# Patient Record
Sex: Female | Born: 1950
Health system: Southern US, Community
[De-identification: ages and names within clinical notes are randomized; demographics above are authoritative.]

## PROBLEM LIST (undated history)

## (undated) DIAGNOSIS — D369 Benign neoplasm, unspecified site: Secondary | ICD-10-CM

## (undated) DIAGNOSIS — M8580 Other specified disorders of bone density and structure, unspecified site: Secondary | ICD-10-CM

## (undated) DIAGNOSIS — G5600 Carpal tunnel syndrome, unspecified upper limb: Secondary | ICD-10-CM

## (undated) DIAGNOSIS — Z860100 Personal history of colon polyps, unspecified: Secondary | ICD-10-CM

## (undated) DIAGNOSIS — N390 Urinary tract infection, site not specified: Secondary | ICD-10-CM

## (undated) DIAGNOSIS — Z9889 Other specified postprocedural states: Secondary | ICD-10-CM

## (undated) DIAGNOSIS — F329 Major depressive disorder, single episode, unspecified: Secondary | ICD-10-CM

## (undated) DIAGNOSIS — R112 Nausea with vomiting, unspecified: Secondary | ICD-10-CM

## (undated) HISTORY — PX: KNEE ARTHROSCOPY: SHX127

## (undated) HISTORY — DX: Other specified disorders of bone density and structure, unspecified site: M85.80

## (undated) HISTORY — PX: ORIF TOE FRACTURE: SUR965

## (undated) HISTORY — DX: Personal history of colonic polyps: Z86.010

## (undated) HISTORY — DX: Benign neoplasm, unspecified site: D36.9

## (undated) HISTORY — DX: Personal history of colon polyps, unspecified: Z86.0100

## (undated) HISTORY — DX: Carpal tunnel syndrome, unspecified upper limb: G56.00

## (undated) HISTORY — PX: BUNIONECTOMY: SHX129

## (undated) HISTORY — PX: APPENDECTOMY: SHX54

## (undated) HISTORY — PX: OTHER SURGICAL HISTORY: SHX169

## (undated) HISTORY — PX: HEMORRHOID SURGERY: SHX153

## (undated) HISTORY — PX: TUBAL LIGATION: SHX77

## (undated) HISTORY — PX: CHOLECYSTECTOMY: SHX55

## (undated) HISTORY — DX: Major depressive disorder, single episode, unspecified: F32.9

## (undated) HISTORY — PX: KNEE ARTHROSCOPY: SUR90

---

## 1992-10-24 HISTORY — PX: ABDOMINAL HYSTERECTOMY: SHX81

## 2000-11-15 ENCOUNTER — Emergency Department (HOSPITAL_COMMUNITY): Admission: EM | Admit: 2000-11-15 | Discharge: 2000-11-15 | Payer: Self-pay | Admitting: Emergency Medicine

## 2001-03-05 ENCOUNTER — Encounter: Payer: Self-pay | Admitting: Obstetrics and Gynecology

## 2001-03-05 ENCOUNTER — Ambulatory Visit (HOSPITAL_COMMUNITY): Admission: RE | Admit: 2001-03-05 | Discharge: 2001-03-05 | Payer: Self-pay | Admitting: Obstetrics and Gynecology

## 2001-10-30 ENCOUNTER — Encounter: Payer: Self-pay | Admitting: Internal Medicine

## 2001-10-30 ENCOUNTER — Encounter: Payer: Self-pay | Admitting: Emergency Medicine

## 2001-10-30 ENCOUNTER — Observation Stay (HOSPITAL_COMMUNITY): Admission: EM | Admit: 2001-10-30 | Discharge: 2001-10-31 | Payer: Self-pay | Admitting: Emergency Medicine

## 2001-10-31 ENCOUNTER — Encounter: Payer: Self-pay | Admitting: Internal Medicine

## 2001-10-31 ENCOUNTER — Encounter: Payer: Self-pay | Admitting: Family Medicine

## 2001-11-08 ENCOUNTER — Ambulatory Visit (HOSPITAL_COMMUNITY): Admission: RE | Admit: 2001-11-08 | Discharge: 2001-11-08 | Payer: Self-pay | Admitting: Internal Medicine

## 2002-05-13 ENCOUNTER — Emergency Department (HOSPITAL_COMMUNITY): Admission: EM | Admit: 2002-05-13 | Discharge: 2002-05-13 | Payer: Self-pay | Admitting: Emergency Medicine

## 2002-05-14 ENCOUNTER — Ambulatory Visit (HOSPITAL_COMMUNITY): Admission: RE | Admit: 2002-05-14 | Discharge: 2002-05-14 | Payer: Self-pay | Admitting: Family Medicine

## 2002-05-14 ENCOUNTER — Encounter: Payer: Self-pay | Admitting: Family Medicine

## 2002-08-16 ENCOUNTER — Ambulatory Visit (HOSPITAL_COMMUNITY): Admission: RE | Admit: 2002-08-16 | Discharge: 2002-08-16 | Payer: Self-pay | Admitting: Family Medicine

## 2002-08-16 ENCOUNTER — Encounter: Payer: Self-pay | Admitting: Family Medicine

## 2002-10-09 ENCOUNTER — Encounter: Payer: Self-pay | Admitting: Emergency Medicine

## 2002-10-09 ENCOUNTER — Emergency Department (HOSPITAL_COMMUNITY): Admission: EM | Admit: 2002-10-09 | Discharge: 2002-10-10 | Payer: Self-pay | Admitting: Emergency Medicine

## 2003-07-04 ENCOUNTER — Encounter: Payer: Self-pay | Admitting: Family Medicine

## 2003-07-04 ENCOUNTER — Ambulatory Visit (HOSPITAL_COMMUNITY): Admission: RE | Admit: 2003-07-04 | Discharge: 2003-07-04 | Payer: Self-pay | Admitting: Family Medicine

## 2003-08-19 ENCOUNTER — Ambulatory Visit (HOSPITAL_COMMUNITY): Admission: RE | Admit: 2003-08-19 | Discharge: 2003-08-19 | Payer: Self-pay | Admitting: Family Medicine

## 2003-10-01 ENCOUNTER — Encounter (INDEPENDENT_AMBULATORY_CARE_PROVIDER_SITE_OTHER): Payer: Self-pay | Admitting: Specialist

## 2003-10-01 ENCOUNTER — Inpatient Hospital Stay (HOSPITAL_COMMUNITY): Admission: RE | Admit: 2003-10-01 | Discharge: 2003-10-03 | Payer: Self-pay | Admitting: Obstetrics and Gynecology

## 2004-03-08 ENCOUNTER — Emergency Department (HOSPITAL_COMMUNITY): Admission: EM | Admit: 2004-03-08 | Discharge: 2004-03-08 | Payer: Self-pay | Admitting: Emergency Medicine

## 2004-10-05 ENCOUNTER — Ambulatory Visit (HOSPITAL_COMMUNITY): Admission: RE | Admit: 2004-10-05 | Discharge: 2004-10-05 | Payer: Self-pay | Admitting: Family Medicine

## 2004-10-24 HISTORY — PX: COLONOSCOPY: SHX174

## 2004-11-08 ENCOUNTER — Ambulatory Visit: Payer: Self-pay | Admitting: Gastroenterology

## 2004-11-23 ENCOUNTER — Ambulatory Visit: Payer: Self-pay | Admitting: Gastroenterology

## 2005-04-15 ENCOUNTER — Ambulatory Visit (HOSPITAL_COMMUNITY): Admission: RE | Admit: 2005-04-15 | Discharge: 2005-04-15 | Payer: Self-pay | Admitting: Family Medicine

## 2007-08-24 ENCOUNTER — Ambulatory Visit (HOSPITAL_BASED_OUTPATIENT_CLINIC_OR_DEPARTMENT_OTHER): Admission: RE | Admit: 2007-08-24 | Discharge: 2007-08-24 | Payer: Self-pay | Admitting: Orthopedic Surgery

## 2008-11-14 ENCOUNTER — Ambulatory Visit: Payer: Self-pay | Admitting: Internal Medicine

## 2008-11-19 ENCOUNTER — Ambulatory Visit (HOSPITAL_COMMUNITY): Admission: RE | Admit: 2008-11-19 | Discharge: 2008-11-19 | Payer: Self-pay | Admitting: Family Medicine

## 2008-12-09 ENCOUNTER — Ambulatory Visit: Payer: Self-pay | Admitting: Internal Medicine

## 2008-12-09 ENCOUNTER — Ambulatory Visit (HOSPITAL_COMMUNITY): Admission: RE | Admit: 2008-12-09 | Discharge: 2008-12-09 | Payer: Self-pay | Admitting: Internal Medicine

## 2008-12-09 ENCOUNTER — Encounter: Payer: Self-pay | Admitting: Internal Medicine

## 2008-12-09 HISTORY — PX: COLONOSCOPY: SHX174

## 2008-12-16 ENCOUNTER — Telehealth (INDEPENDENT_AMBULATORY_CARE_PROVIDER_SITE_OTHER): Payer: Self-pay

## 2008-12-19 ENCOUNTER — Ambulatory Visit (HOSPITAL_COMMUNITY): Admission: RE | Admit: 2008-12-19 | Discharge: 2008-12-19 | Payer: Self-pay | Admitting: Internal Medicine

## 2009-08-26 ENCOUNTER — Encounter: Payer: Self-pay | Admitting: Internal Medicine

## 2009-09-23 ENCOUNTER — Ambulatory Visit (HOSPITAL_COMMUNITY): Admission: RE | Admit: 2009-09-23 | Discharge: 2009-09-23 | Payer: Self-pay | Admitting: Family Medicine

## 2010-01-11 ENCOUNTER — Emergency Department (HOSPITAL_COMMUNITY): Admission: EM | Admit: 2010-01-11 | Discharge: 2010-01-11 | Payer: Self-pay | Admitting: Emergency Medicine

## 2010-11-30 ENCOUNTER — Other Ambulatory Visit (HOSPITAL_COMMUNITY): Payer: Self-pay | Admitting: Family Medicine

## 2010-11-30 DIAGNOSIS — Z139 Encounter for screening, unspecified: Secondary | ICD-10-CM

## 2010-12-06 ENCOUNTER — Ambulatory Visit (HOSPITAL_COMMUNITY)
Admission: RE | Admit: 2010-12-06 | Discharge: 2010-12-06 | Disposition: A | Discharge status: Home / Self Care | Payer: Federal, State, Local not specified - PPO | Source: Ambulatory Visit | Attending: Family Medicine | Admitting: Family Medicine

## 2010-12-06 DIAGNOSIS — Z139 Encounter for screening, unspecified: Secondary | ICD-10-CM

## 2010-12-06 DIAGNOSIS — Z1231 Encounter for screening mammogram for malignant neoplasm of breast: Secondary | ICD-10-CM | POA: Insufficient documentation

## 2011-01-14 LAB — URINALYSIS, ROUTINE W REFLEX MICROSCOPIC
Bilirubin Urine: NEGATIVE
Glucose, UA: NEGATIVE mg/dL
Hgb urine dipstick: NEGATIVE
Ketones, ur: NEGATIVE mg/dL
Nitrite: NEGATIVE
Protein, ur: NEGATIVE mg/dL
Specific Gravity, Urine: 1.015 (ref 1.005–1.030)
Urobilinogen, UA: 0.2 mg/dL (ref 0.0–1.0)
pH: 8 (ref 5.0–8.0)

## 2011-02-08 LAB — CREATININE, SERUM
Creatinine, Ser: 0.7 mg/dL (ref 0.4–1.2)
GFR calc Af Amer: >60 mL/min (ref >60)
GFR calc non Af Amer: >60 mL/min (ref >60)

## 2011-03-08 NOTE — Op Note (Signed)
NAME:  Lori [name], Lori [name]                 ACCOUNT NO.:  [id number]   MEDICAL RECORD NO.:  [id number]          PATIENT TYPE:  AMB   LOCATION:  DSC                          FACILITY:  [facility]   PHYSICIAN:  Harvie Junior, M.D.   DATE OF BIRTH:  September 23, 1951   DATE OF PROCEDURE:  DATE OF DISCHARGE:                               OPERATIVE REPORT   PREOPERATIVE DIAGNOSES:  Impingement, acromioclavicular joint arthritis,  with suspected possible rotator cuff tear.   POSTOPERATIVE DIAGNOSES:  1. Impingement.  2. Acromioclavicular joint arthritis.  3. Biceps tendon rupture.  4. Rotator cuff tear.   PROCEDURE:  1. Mini-open repair of rotator cuff.  2. Arthroscopic acromioplasty, arthroscopic excision of os acromiale,      essentially an acromioplasty.  3. Arthroscopic distal clavicle resection.  4. Mini-open biceps tenodesis.   SURGEON:  Harvie Junior, M.D.   ASSISTANT:  Marshia Ly, P.A.   ANESTHESIA:  General.   BRIEF HISTORY:  Lori [name] is a 60 year old female with a long history of  having had significant pain in the right shoulder.  We treated her  conservatively for a long period of time.  Because of continued  complaints of pain, she is ultimately taken to the operating room for  subacromial decompression and distal clavicle resection.  Preoperative x-  rays showed she had a large os acromiale, although it went back about to  the level of the distal clavicle, and we felt that excision of this was  going to be the appropriate course of action.  We discussed with her  preoperatively.  At this point the patient is taken to operating room  for fixation.   PROCEDURE:  The patient was taken to the operating room and after  adequate institution of general anesthetic, the patient _______ to the  right shoulder was prepped and draped in the usual sterile fashion.  Following this a routine arthroscopic examination revealed that there  was an obvious tear of the biceps tendon that was  catching in the joint.  We debrided this back and it looked like more than 50 or 60% of the  biceps tendon was torn.  We went up into the glenohumeral joint, it  looked pristine.  We went up into the rotator cuff just posterior to the  biceps tendon rupture and tear was the supraspinatus tear that was about  95% torn.  At that point the biceps tendon was released from the  superior labrum and the superior labrum was debrided within the  glenohumeral joint.  Following this attention was turned out of the  glenohumeral joint into the subacromial space where an arthroscopic  acromioplasty was performed from the lateral posterior compartment.  An  arthroscopic distal clavicle resection was performed from the lateral  compartment.  Once this was completed, attention was turned towards the  mini-open rotator cuff repair.  A small incision was made over the  lateral aspect of the shoulder.  Subcutaneous tissue  _________of the  deltoid was divided in line with its fibers.  Following this, the  rotator cuff was identified below and the  tear was easily palpable.  This was released and opened, and the biceps tendon was just anterior to  this.  The biceps tendon was grasped within the wound.  At this point a  bur was used to bur up the greater tuberosity and the bicipital groove,  and a suture anchor was placed in the bicipital groove, 2 sutures were  placed through the biceps tendon to allow an excellent repair into the  rarified biceps groove.  Attention was then turned just posterior to  this where a suture anchor was placed, a 5/5 Biomet anchor, and the 4  throws were placed through the rotator cuff.  Once this was tied down,  excellent range of motion was achieved with the shoulder.  There was no  tendency towards impingement.  At this point the shoulder was copiously  and thoroughly irrigated and suctioned dry.  The biceps tendon had been  tensioned with the arm all the way down into extension  to make sure that  it did not have an ability to be too loose within the shoulder.  At this  point the deltoid fibers were closed with 1-Vicryl running, the skin  with 0 and 2-0 Vicryl and a 3-0 Monocryl pullout suture.  Benzoin and  Steri-Strips were applied.  Sterile compressive dressing was applied,  after the bandage had been used for the anterior and posterior portals.  Of note Gus Puma assisted throughout the case and was present  throughout the case.      Harvie Junior, M.D.  Electronically Signed     JLG/MEDQ  D:  08/24/2007  T:  08/25/2007  Job:  [id number]

## 2011-03-08 NOTE — Op Note (Signed)
NAME:  [name], Lori                 ACCOUNT NO.:  [id number]   MEDICAL RECORD NO.:  [id number]          PATIENT TYPE:  AMB   LOCATION:  DAY                           FACILITY:  [facility]   PHYSICIAN:  R. Roetta Sessions, M.D. DATE OF BIRTH:  [birth year]   DATE OF PROCEDURE:  12/09/2008  DATE OF DISCHARGE:                               OPERATIVE REPORT   PROCEDURE:  Colonoscopy and snare polypectomy.   INDICATIONS FOR PROCEDURE:  The patient is a pleasant 60 year old  African American lady who underwent a colonoscopy in 2006, by Dr. Victorino Dike done in [place].  She had a colonic adenoma removed and was  told to return in 4 years.  She is having no lower GI tract symptoms  currently.  She is here for surveillance.  She tells me that one of her  sister's had part for colon taken out for what sounds like a  precancerous lesion.  This approach along with the risks, benefits,  alternatives and limitations have been discussed with [name] and she  is agreeable.  Please see the documentation in the medical record.   PROCEDURE NOTE:  O2 saturation, blood pressure, pulse and respiration  were monitored throughout the entire procedure.  Conscious sedation;  Versed 3 mg IV, Demerol 75 mg IV.   INSTRUMENT:  Pentax video chip system.   FINDINGS:  Digital rectal exam revealed no abnormalities.   Endoscopic findings:  The prep was good.  Colon:  Colonic mucosa was  surveyed from the rectosigmoid junction through the left, transverse,  right colon to the appendiceal orifice, ileocecal valve and cecum.  These structures well seen and photographed for the record.  The  terminal ileum was intubated to 5 cm.  From this level, the scope was  slowly withdrawn.  All previous mucosal surfaces were again seen.  The  patient was noted to have a 6-mm polyp at the hepatic flexure which was  cold snared and recovered through the scope.  The remainder of the  colonic mucosa and terminal ileum mucosa appeared  normal.  The scope was  pulled down in the rectum where a thorough examination of the rectal  mucosa, including retroflexed view of the anal verge demonstrated no  abnormalities.  The patient tolerated the procedure well and was  reactive in endoscopy.   IMPRESSION:  1. Normal rectum.  2. Hepatic flexure polyp, status post snare polypectomy.  Remainder of      colonic mucosa and terminal ileum mucosa appeared normal.   RECOMMENDATIONS:  1. Follow-up on path.  2. Further recommendations to follow.      Jonathon Bellows, M.D.  Electronically Signed     RMR/MEDQ  D:  12/09/2008  T:  12/09/2008  Job:  [id number]   cc:   Lorin Picket A. Gerda Diss, MD  Fax: [phone]

## 2011-03-08 NOTE — H&P (Signed)
NAME:  Lori [name], Lori [name]                 ACCOUNT NO.:  [id number]   MEDICAL RECORD NO.:  [id number]          PATIENT TYPE:  AMB   LOCATION:  DAY                           FACILITY:  [facility]   PHYSICIAN:  R. Roetta Sessions, M.D. DATE OF BIRTH:  [birth year]   DATE OF ADMISSION:  DATE OF DISCHARGE:  LH                              HISTORY & PHYSICAL   CHIEF COMPLAINT:  Due for surveillance colonoscopy.   HISTORY OF PRESENT ILLNESS:  Lori [name] is a 60 year old African American  female.  She had a colonoscopy by Dr. Victorino Dike on November 23, 2004.  She had a 6 mm polyp removed from her ascending colon.  We do not have  pathology, however, at this time we have requested it.  He stated in his  note she was have to have another colonoscopy in 4 years.  She denies  any GI problems, specifically nausea, vomiting, abdominal pain, anorexia  or early satiety, rectal bleeding or melena.  She denies any history of  diarrhea or constipation.  Her weight has remained stable.   PAST MEDICAL AND SURGICAL HISTORY:  She has had hemorrhoid surgery in  1990.  She had multiple polyps removed at that time.  She had a  colonoscopy as described in HPI by Dr. Victorino Dike November 23, 2004 with  history of polyp.  She has had a tubal ligation, appendectomy and  hysterectomy and right rotator cuff surgery.   CURRENT MEDICATIONS:  Calcium occasionally, cod liver oil occasionally  and Tylenol occasionally.   ALLERGIES:  No known drug allergies.   FAMILY HISTORY:  There is question as to whether 1 of her 6 sisters has  had part of her colon removed.  She believes this may have been colon  cancer.  She has otherwise has 8 healthy siblings; a mother 49 with  diabetes mellitus, hypertension, and bipolar disorder; a father 36 with  hypertension and prostate cancer.   SOCIAL HISTORY:  Lori [name] is married.  She has 2 healthy children.  She  is retired from the [employer].  She has a remote history of tobacco  use.  She  occasionally consumes a glass of wine.  Denies any drug use.   REVIEW OF SYSTEMS:  See HPI, otherwise negative.   HISTORY OF PRESENT ILLNESS:  VITAL SIGNS:  Weight 174 pounds, height 65  inches, temperature 94, blood pressure 122/80 and pulse 60.  GENERAL:  She is a well-developed, well-nourished [place] American  female who is alert and pleasant, cooperative no acute distress.  HEENT:  Sclerae clear, nonicteric conjunctivae.  Oropharynx pink and  moist without lesions.  NECK:  Supple without mass, thyromegaly.  CHEST:  Heart regular rhythm.  Normal S1, S2 without murmurs, clicks,  rubs or gallops.  LUNGS:  Clear to auscultation bilaterally.  ABDOMEN:  Positive bowel sounds x4.  No bruits auscultated.  Soft,  nontender, nondistended without palpable mass or megaly.  No rash or  guarding.  EXTREMITIES:  Without clubbing or edema.   IMPRESSION:  This is a 60 year old [place] American female with history  of  colonic polyps.  She is due for surveillance colonoscopy.  Denies any  gastrointestinal complaints this time.   PLAN:  Colonoscopy with Dr. Jena Gauss in the near future.  Discussed the  procedure, including risks and benefits, which include __________  infection, perforation, drug reaction use.   Primary care physician Scott A. Gerda Diss, MD      Lorenza Burton, N.P.      Jonathon Bellows, M.D.  Electronically Signed    KJ/MEDQ  D:  11/14/2008  T:  11/14/2008  Job:  [id number]   cc:   Lorin Picket A. Gerda Diss, MD  Fax: [phone]

## 2011-03-11 NOTE — Op Note (Signed)
NAME:  Lori [name], Lori [name]                           ACCOUNT NO.:  [id number]   MEDICAL RECORD NO.:  [id number]                   PATIENT TYPE:  INP   LOCATION:  9326                                 FACILITY:  [facility]   PHYSICIAN:  Randye Lobo, M.D.                DATE OF BIRTH:  August 05, 1951   DATE OF PROCEDURE:  10/01/2003  DATE OF DISCHARGE:                                 OPERATIVE REPORT   PREOPERATIVE DIAGNOSES:  1. Symptomatic uterine leiomyomata.  2. Vulvar cyst.   POSTOPERATIVE DIAGNOSES:  1. Symptomatic uterine leiomyomata.  2. Minimal endometriosis.  3. Vulvar abscess.  4. Omental adhesions.   PROCEDURES:  1. Total abdominal hysterectomy, bilateral salpingo-oophorectomy.  2. Lysis of adhesions.  3. Removal of infected vulvar cyst.   SURGEON:  Randye Lobo, M.D.   ASSISTANT:  Miguel Aschoff, M.D.   ANESTHESIA:  General endotracheal.   FLUIDS REPLACED:  2300 mL Ringer's lactate.   ESTIMATED BLOOD LOSS:  75 mL.   URINE OUTPUT:  400 mL.   COMPLICATIONS:  None.   INDICATION FOR PROCEDURE:  The patient is a 60 year old gravida 2, para 2,  African-American female, status post bilateral tubal ligation, who presented  with abdominal bloating and distention in addition to urinary pressure and  frequency and intermittent abdominal pain.  Ultrasound confirmed the  presence of multiple uterine leiomyomata.  During the patient's consultation  in the office, she did report some loss of urine, which was investigated  with multichannel urodynamic testing.  There was no evidence of genuine  stress incontinence.  The patient also reported a longstanding tender cyst  of the right vulva.  The patient on physical exam was noted to have a 5 mm  tender right labia majora cyst and a uterus which was approximately 18  weeks' size.  The patient presents now for a total abdominal hysterectomy  with bilateral salpingo-oophorectomy and removal of the right vulvar cyst.  Risks, benefits, and  alternatives have been discussed with the patient, who  wishes to proceed.   FINDINGS:  Exam under anesthesia revealed a 16-17 week globular, mobile  uterus.  No adnexal masses were appreciated, although the adnexa were not  palpable separate from the uterus.  The vulva demonstrated a 6-7 mm right  labia majora cyst which was filled with pus.   Laparotomy demonstrated an enlarged uterus with a 10 cm fundal myometrial  fibroid.  The patient had normal tubes and ovaries.  The rectum was noted to  be adherent to the right posterior uterosacral ligament and in this region  were powder-blue lesions of endometriosis.  In the upper abdomen, the  omentum was adherent to the anterior superior abdominal wall.  The kidneys  were palpably normal.  There were no enlarged lymph nodes around the aorta  or in the pelvic region.   SPECIMENS:  The uterus, tubes, and ovaries were sent to pathology  separately  from the infected vulvar cyst.   DESCRIPTION OF PROCEDURE:  With an IV in place, the patient was taken to the  operating room after she was properly identified.  She did receive Ancef 1 g  intravenously for antibiotic prophylaxis.  On the operating room table,  general endotracheal anesthesia was induced and the patient was then placed  in the dorsal lithotomy position.  The abdomen, vagina, and vulva were then  sterilely prepped and draped.  The procedure began with removal of the  vulvar cyst of the right labia majora.  A vertical incision was created in  the labia majora and the vulvar cyst was entered during this process and  noted to be a small abscess.  Sharp dissection was used to remove the cyst  from the surrounding vulvar tissue.  This cyst was then sent to pathology.  The base of the surgical site was closed with one through-and-through suture  of 3-0 Vicryl.  The skin was then closed with two interrupted sutures of the  same.   Attention was then turned to the abdomen, and a vertical  midline incision  was created sharply with a scalpel.  This was carried down to the fascia  using monopolar cautery.  The fascia was then incised vertically in the  midline with a scalpel, and the incision was extended sharply with the Mayo  scissors.  The rectus muscle was dissected off of the overlying fascia and  the midline between the bellies of the rectus muscles was identified.  The  posterior rectus sheath was grasped with two hemostat clamps and was then  sharply incised.  The parietal peritoneum was then grasped with two hemostat  clamps and was sharply entered.  The peritoneal incision was extended  cranially and caudally.   An inspection of the intra-abdominal and pelvic organs was performed and the  findings are as noted above.   A self-retaining retractor was then placed in the pelvis and the bowel was  packed into the upper abdomen using moistened lap pads.  Long Kelly clamps  were then placed across the adnexal structures bilaterally.  The right round  ligament was grasped and sutured with a transfixing suture of 0 Vicryl.  It  was divided using monopolar cautery.  The bladder flap was then created on  this side sharply and the broad ligament was opened posteriorly.  A small  window was created through the posterior leaf of the broad ligament, and the  infundibulopelvic ligament was clamped at this time above the level of the  ureter.  It was then sharply divided and sutured with a free tie of 0 Vicryl  followed by a suture ligature of the same.  The same procedure that was  performed on the right-hand side was then repeated on the left-hand side.  The bladder flap was then developed further.  The uterine arteries were  skeletonized bilaterally.  Each of the uterine arteries were then clamped,  sharply divided, and sutured with 0 Vicryl sutures.  The lesion of endometriosis was appreciated at this time on the posterior aspect of the  right uterosacral ligament.  A  vertical incision was then created at the  base of the cervix posteriorly in a transverse fashion, such that the  rectovaginal septum could be entered with a combination of sharp and blunt  dissection to draw the uterosacral ligament and the rectum away from the  surgical field.  The remainder of the cardinal ligaments were then clamped,  sharply divided, and suture ligated.  The uterosacral ligaments were then  clamped, sharply divided, and sutured with transfixing sutures of 0 Vicryl.  At this point, entry had been accomplished into the vagina.  The specimen  was sharply excised from the surrounding vagina, and the uterus, tubes, and  ovaries were sent to pathology.  This was sent separately from the vulvar  cyst.  The vagina was then closed with one figure-of-eight suture of 0  Vicryl.  The pelvis was then irrigated and inspected for hemostasis.  There  was some bleeding noted along the peritoneum anteriorly on the patient's  left-hand side down near the bladder, and this responded well to monopolar  cautery.  There was also a small region in the cuff on the left-hand side  which was bleeding slightly and responded again to monopolar cautery.  Hemostasis was noted to be excellent at this time in all pedicle sites, and  the abdomen was therefore closed.  The moistened lap pads and the self-  retaining retractor were removed.  The peritoneum along the superior portion  of the incision was noted to be adherent to omentum, and this was released  using monopolar cautery.  Hemostasis was excellent.  The peritoneal cavity  was then closed with a running suture of 2-0 Vicryl.  The fascia was then  closed with a running suture of 0 Vicryl.  The subcutaneous tissue was  irrigated and suctioned and found to be hemostatic.  The subcutaneous tissue  was closed with interrupted sutures of 3-0 plain.  The skin was closed with  staples and a sterile bandage was placed over this.  This concluded the   patient's procedure.  The patient did receive a Foley catheter for the  procedure and will continue to have this postoperatively.  All needle,  instrument, and sponge counts were correct.                                               Randye Lobo, M.D.    BES/MEDQ  D:  10/01/2003  T:  10/02/2003  Job:  [id number]   cc:   Sherie Don, R.N., F.N.P.   Donna Bernard, M.D.  [address]  [place]  [place] [address]  Fax: [phone]

## 2011-03-11 NOTE — Procedures (Signed)
[facility]  Patient:    [name], Lori Visit Number: [id number] MRN: [id number]          Service Type: OUT Location: RAD Attending Physician:  Pricilla Riffle Dictated by:   Rozell Searing, P.A. Proc. Date: 11/08/01 Admit Date:  11/08/2001 Discharge Date: 11/08/2001                                Stress Test  INDICATION:  [name] is a 60 year old female, recently hospitalized at [facility] for evaluation of chest pain who ruled out for myocardial infarction and was found to have a subtle area of reversibility in the anterior septal wall by Cardiolite stress testing.  She is now referred for followup stress echocardiogram.  TEST:  The patient exercised a total of 9 minutes 27 seconds on standard Bruce protocol.  Heart rate rose from 83 baseline to 173 maximum (101% of predicted maximal heart rate; 10.1 metabolic equivalents).  Blood pressure rose from 124/70 baseline to 184/70 maximum.  The patient reported some mild midsternal chest burning at peak exercise with spontaneous resolution during recovery.  Serial EKG tracings suggested an approximate 1.5 mm horizontal ST depression leads II, III, aVF, and V4-V6 at peak exercise with prompt return to baseline at early recovery.  Test was discontinued secondary to fatigue.  CONCLUSIONS:  Positive treadmill stress test secondary to complaint of chest discomfort and transient inferolateral ST abnormalities at peak exercise; Cardiolite images pending. Dictated by:   Rozell Searing, P.A. Attending Physician:  Pricilla Riffle DD:  11/08/01 TD:  11/10/01 Job: [id number]

## 2011-03-11 NOTE — H&P (Signed)
NAME:  Lori [name], Lori [name]                           ACCOUNT NO.:  [id number]   MEDICAL RECORD NO.:  [id number]                   PATIENT TYPE:  INP   LOCATION:  NA                                   FACILITY:  [facility]   PHYSICIAN:  Randye Lobo, M.D.                DATE OF BIRTH:  04-Dec-1950   DATE OF ADMISSION:  10/01/2003  DATE OF DISCHARGE:                                HISTORY & PHYSICAL   CHIEF COMPLAINT:  Abdominal bloating, distention, and pelvic pain.   HISTORY OF PRESENT ILLNESS:  The patient is a 60 year old, gravida 2, para  2, African-American female, status post bilateral tubal ligation who  presented to her primary care Kaipo Ardis reporting abdominal bloating and  distention in additional to urinary pressure and frequency.  The patient had  a pelvic ultrasound which documented an overall uterine size of 16.6 x 13.6  x 8.9 cm.  Multiple leiomyomata were noted in the myometrium.  There were no  dominant fibroids.  The endometrium was distorted by the fibroids and could  not be seen well.  The ovaries were within normal limits and there was no  free fluid noted in the cul-de-sac.  The patient reported that she was  having normal and regular menses, although, she states that she skipped her  period in the month of October.  She denies any history of intermenstrual  bleeding.   Upon further evaluation of the patient's urinary symptoms, she reports  urinary frequency and loss of urine occasionally with a sneeze, with  laughing, or with jumping.  She does use a panty liner.  The patient denies  urge incontinence.  The patient did undergo multichannel urodynamic testing  in the office at which time, no evidence of genuine stress incontinence  could be documented.  The patient had an excellent Valsalva effort during  the testing.  The CMG was noted to be stable.  Both the uroflowmetry and the  pressure flow study documented an intermittent voiding pattern.  The patient  did have a  postvoid residual of 75 mL.   The patient also notes that she has had a longstanding tender cyst of the  right vulva.   The patient desires surgery for the uterine fibroids and the vulvar cyst,  and she declines any future childbearing.   PAST OBSTETRICAL AND GYN HISTORY:  The patient reports monthly menses.  She  skipped her menses in October of this year.  She denies intermenstrual  bleeding.  The patient is status post normal spontaneous vaginal delivery  x2.  She is status post bilateral tubal ligation through a Pfannenstiel  incision.  Her last Pap smear was performed in October of 2004 and was  within normal limits, and her last mammogram was performed in October of  2004 and was also within normal limits.  A bone densitometry performed a  couple of years ago apparently shows osteopenia.  PAST MEDICAL HISTORY:  1. Peripheral venous stasis.  2. Low back pain for which the patient is undergoing physical therapy.   PAST SURGICAL HISTORY:  1. Status post bilateral tubal ligation.  2. Status post laparoscopic cholecystectomy.  3. Status post hemorrhoidectomy.  4. Status post appendectomy.  5. Fractures of the feet.   MEDICATIONS:  None.   ALLERGIES:  No known drug allergies.   SOCIAL HISTORY:  The patient is married.  She works as a Research scientist (physical sciences).  She  denies the use of tobacco, alcohol, or illicit drugs.   FAMILY HISTORY:  Positive for hypertension in the patient's mother, father,  and sister.  Positive for diabetes mellitus in the patient's mother.  The  patient denies a family history of heart disease, high cholesterol, or  stroke.  Her family history is also negative for breast, uterine, ovarian,  or colon cancer.   PHYSICAL EXAMINATION:  GENERAL:  The patient is a middle-aged female in no  acute distress.  HEENT:  Normocephalic and atraumatic.  NECK:  Negative for adenopathy and thyromegaly.  LUNGS:  Clear to auscultation bilaterally.  HEART:  S1 and S2 with a  regular rate and rhythm.  ABDOMEN:  There is a well-healed Pfannenstiel incision and scattered  laparoscopic incisions.  There is no evidence of herniation.  There is a  mass which is palpable to the level of the umbilicus -3 cm.  It is noted to  be in the midline and is nontender.  BREASTS:  No dominant masses, skin retractions, nipple discharge, or  axillary adenopathy.  PELVIC:  There is a 4 to 5 mm tender right vulvar cyst of the labia majora.  The urethra is noted to be normal.  The vagina and cervix are without  lesions.  The uterus is noted to be approximately 18 weeks size and is  nontender.  No adnexal masses are appreciated.  The adnexa are not palpable  separate from the uterus.  EXTREMITIES:  Evidence of bilateral varicose veins.   IMPRESSION:  The patient is a 60 year old gravida 2, para 2, female, status  post bilateral tubal ligation, with the diagnosis of symptomatic uterine  leiomyomata, potential genuine stress incontinence, and a right vulvar cyst.   PLAN:  The patient will undergo a total abdominal hysterectomy with  bilateral salpingo-oophorectomy and removal of the vulvar cyst at [facility] on October 01, 2003.  The patient has declined an abdominal Charletta Cousin  procedure or a procedure for urinary incontinence.  Risks, benefits, and  alternatives have been discussed with the patient who wishes to proceed.                                               Randye Lobo, M.D.    BES/MEDQ  D:  09/30/2003  T:  09/30/2003  Job:  [id number]   cc:   Sherie Don, RN, FNP   Donna Bernard, M.D.  [address]  [place]  [place] [address]  Fax: [phone]

## 2011-03-11 NOTE — H&P (Signed)
[facility]  Patient:    Lori [name], Lori [name] Visit Number: [id number] MRN: [id number]          Service Type: MED Location: 2A A220 01 Attending Physician:  Harlow Asa Dictated by:   Donna Bernard, M.D. Admit Date:  10/30/2001 Discharge Date: 10/31/2001                           History and Physical  CHIEF COMPLAINT:  Chest pain.  SUBJECTIVE:  This patient is a 60 year old black female who presented to the emergency room on the day of admission with complaints of chest discomfort. She describes her chest pain as occurring in the left anterior portion of her chest.  Last night, she was at work, pulling some equipment, when she began to experience some chest discomfort.  She described the pain as fairly sharp at that time.  There was some associated discomfort radiating towards her shoulder, none radiating down the arm.  She had no significant shortness of breath, no nausea, no diaphoresis.  The patient was close to finishing work, she drove on home, she debated going to the emergency room, but she decided not to, she took two aspirin and went to bed.  She slept through the night, woke up this morning, had ongoing pain and decided to come to the emergency room.  Patient also took two additional aspirin this morning.  Of note, patient uses the word "choking" to describe the symptoms that she continues to experience in the chest.  On further history to her choking, it has nothing to do with swallowing or breathing but rather a pressure or constricting sensation that she is experiencing primarily on the left side of the chest.  Of note, there is no pleuritic component.  She has had reflux in the past but none recently.  FAMILY HISTORY:  Significant for no coronary artery disease.  SOCIAL HISTORY:  Patient is married.  No tobacco abuse times past 10 years. No alcohol abuse.  Has two children.  ALLERGIES:  None known.  CURRENT MEDICATIONS:   None.  PRIOR MEDICAL HISTORY:  Osteopenia, frequent musculoskeletal symptoms and history of remote carpal tunnel syndrome.  REVIEW OF SYSTEMS:  Otherwise negative.  In addition, patient has known fibroids.  PRIOR SURGERIES: 1. Remote hemorrhoidectomy. 2. Remote cholecystectomy. 3. Bilateral tubal ligation.  PHYSICAL EXAMINATION:  VITAL SIGNS:  BP 124/82.  GENERAL:  Patient is afebrile, alert, in no apparent distress.  O2 saturation 98%.  HEENT:  Normal.  NECK:  Supple.  LUNGS:  Clear.  HEART:  Rate and rhythm ______.  CHEST:  Left anterior chest superior to the breast is somewhat tender near the costosternal joint.  ABDOMEN:  Exam benign.  No CVA tenderness.  EXTREMITIES:  Normal.  NEUROLOGIC:  Exam intact.  LABORATORY DATA:  EKG:  Normal sinus rhythm.  No significant ST-T changes.  Cardiac enzymes:  First set within normal limits.  Chest x-ray within normal limits.  IMPRESSION:  Chest pain, rather acute onset, with some features that make it somewhat concerning, however, with the pain coming on while doing significant pulling with the arm and chest muscles, accompanied by the ongoing sensitivity in the left anterior chest with pressure, along with the patients minimal risk factors and benign medical history, I think it is unlikely that she is experiencing true cardiac pain, however, certainly, the patient deserves a workup to rule out an acute ischemic process.    PLAN:  Admit for monitoring, telemetry, serial enzymes; if all remain negative, will schedule a stress test soon. Dictated by:   Donna Bernard, M.D. Attending Physician:  Harlow Asa DD:  10/30/01 TD:  10/31/01 Job: [id number]

## 2011-03-11 NOTE — Discharge Summary (Signed)
NAME:  Lori [name], Lori [name]                           ACCOUNT NO.:  [id number]   MEDICAL RECORD NO.:  [id number]                   PATIENT TYPE:  INP   LOCATION:  9326                                 FACILITY:  [facility]   PHYSICIAN:  Randye Lobo, M.D.                DATE OF BIRTH:  [birth year]   DATE OF ADMISSION:  10/01/2003  DATE OF DISCHARGE:  10/03/2003                                 DISCHARGE SUMMARY   ADMISSION DIAGNOSES:  1. Symptomatic uterine leiomyomata.  2. Vulvar cyst.   DISCHARGE DIAGNOSES:  1. Symptomatic uterine leiomyomata.  2. Minimal endometriosis.  3. Abdominal adhesions.  4. Small vulvar abscess.   SIGNIFICANT OPERATIONS AND PROCEDURES:  The patient underwent a total  abdominal hysterectomy with bilateral salpingo-oophorectomy, lysis of  adhesions, and removal of a tiny infected vulvar cyst at the [facility] on October 01, 2003 under the direction of Randye Lobo, M.D. and with the assistance of Miguel Aschoff, M.D.   ADMISSION HISTORY AND PHYSICAL EXAMINATION:  The patient is a 60 year old  gravida 2, para 2 African-American female, status post bilateral tubal  ligation who presented with abdominal bloating and urinary pressure and  frequency.  Pelvic ultrasounds ordered by the patient's primary care  Reba Hulett documented uterine leiomyomata.  The patient underwent urodynamic  testing which documented the absence of genuine stress incontinence.  The  patient reported a tender cyst of the right vulva.   PHYSICAL EXAMINATION:  Abdominal exam indicated a mass which was palpable to  the level of the umbilicus minus 3 cm.  It was noted to be midline and  nontender.  Pelvic exam documented a 5 mm tender right vulvar cyst of the  labia majora.  The uterus was noted to be approximately 18 weeks size and  was nontender.  No adnexal masses were appreciated but the adnexa were not  easily palpable separate from the uterus.   HOSPITAL COURSE:  The patient  presented to the [facility] on October 01, 2003 for definitive surgical treatment of her uterine leiomyomata and for  removal of the vulvar cyst.  The patient underwent a total abdominal  hysterectomy with bilateral salpingo-oophorectomy and excision of a vulvar  cyst on October 01, 2003.  Findings at the time of surgery documented a 16  weeks size uterus with a large 10 cm fundal myometrial fibroid.  The rectum  was noted to be adherent to the right posterior uterosacral ligament by  powder blue lesions which were consistent with endometriosis.  Omentum was  noted to be adherent to the anterior superior abdominal wall.  The patient  had a tiny vulvar cyst of the right labia majora which measured  approximately 6 to 7 mm.  Appeared to be filled with pus.   The patient's surgery was uncomplicated.  She was admitted postoperatively  for a two-day hospital stay which was unremarkable.  The patient's pain was  controlled initially with a morphine PCA and with Toradol.  She was  successfully converted over to oral Percocet and ibuprofen which controlled  her pain well.  The patient had her Foley catheter removed on postoperative  day #1 and she was able to void spontaneously.  The patient was also able to  ambulate without assistance.  The patient began passing flatus on  postoperative day #2.  Her abdomen remained slightly distended at this time,  but she was noted to have excellent bowel sounds.  The patient will be given  a Dulcolax suppository the morning prior to her discharge.   The patient's abdominal incision remained clean, dry, and intact, and  without any drainage or erythema.  She remained without any significant  fevers during her postoperative course.   The patient's discharge hematocrit is 39.2% and she is tolerating this well.  The final pathology report is pending at the time of discharge.   The patient is found to be in good condition and ready for discharge on   postoperative day #2.   INSTRUCTIONS AT DISCHARGE:  1. Discharged to home.  2. Prescriptions include Percocet one to two p.o. q.4-6 h. p.r.n..  The     patient will also take over-the-counter ibuprofen 600 mg p.o. q.6 h.     p.r.n.  3. The patient will have decreased activity for the next six weeks.  She     will not drive for the next two weeks.  4. The patient will take a regular diet but will consume light meals until     she has complete return of bowel function.  5. The patient will follow up in the office in three days for incision check     and staple removal.  6. She will call if she experiences fever, nausea and vomiting, pain     uncontrolled by her medication, vaginal bleeding, redness or drainage     from the incision, or any other concern.                                               Randye Lobo, M.D.    BES/MEDQ  D:  10/03/2003  T:  10/03/2003  Job:  [id number]

## 2011-08-03 LAB — POCT HEMOGLOBIN-HEMACUE
Hemoglobin: 14.6
Operator id: 112821

## 2012-05-21 ENCOUNTER — Other Ambulatory Visit: Payer: Self-pay | Admitting: Family Medicine

## 2012-05-21 DIAGNOSIS — Z139 Encounter for screening, unspecified: Secondary | ICD-10-CM

## 2012-05-28 ENCOUNTER — Ambulatory Visit (HOSPITAL_COMMUNITY)
Admission: RE | Admit: 2012-05-28 | Discharge: 2012-05-28 | Disposition: A | Discharge status: Home / Self Care | Payer: Federal, State, Local not specified - PPO | Source: Ambulatory Visit | Attending: Family Medicine | Admitting: Family Medicine

## 2012-05-28 DIAGNOSIS — Z139 Encounter for screening, unspecified: Secondary | ICD-10-CM

## 2012-05-28 DIAGNOSIS — Z1231 Encounter for screening mammogram for malignant neoplasm of breast: Secondary | ICD-10-CM | POA: Insufficient documentation

## 2012-11-08 ENCOUNTER — Encounter (HOSPITAL_COMMUNITY): Payer: Self-pay | Admitting: Psychiatry

## 2012-11-08 ENCOUNTER — Ambulatory Visit (INDEPENDENT_AMBULATORY_CARE_PROVIDER_SITE_OTHER): Payer: Federal, State, Local not specified - PPO | Admitting: Psychiatry

## 2012-11-08 DIAGNOSIS — F4323 Adjustment disorder with mixed anxiety and depressed mood: Secondary | ICD-10-CM

## 2012-11-09 ENCOUNTER — Encounter (HOSPITAL_COMMUNITY): Payer: Self-pay | Admitting: Psychiatry

## 2012-11-09 NOTE — Patient Instructions (Signed)
Discussed orally 
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
## 2012-11-12 ENCOUNTER — Ambulatory Visit (INDEPENDENT_AMBULATORY_CARE_PROVIDER_SITE_OTHER): Payer: Federal, State, Local not specified - PPO | Admitting: Psychiatry

## 2012-11-12 DIAGNOSIS — F4323 Adjustment disorder with mixed anxiety and depressed mood: Secondary | ICD-10-CM

## 2012-11-14 NOTE — Progress Notes (Signed)
Patient:  Lori [name]   DOB: [birth year]  MR Number: [id number]  [facility]:  [address] [place],  [place], [address]  Start: Monday 11/12/2012 9:00 AM End: Monday 11/12/2012 9:50 AM  Provider/Observer:     Florencia Reasons, MSW, LCSW   Chief Complaint:      Chief Complaint  Patient presents with  . Depression  . Anxiety    Reason For Service:     The patient was referred for services by PA Sherie Don due to patient experiencing anxiety and depression. Per patient's report, her father died in 06/04/2013and her husband died suddenly 3 months later. Patient reports attending the [facility] and coping with grief and loss issues. However, in the course of managing financial issues since her husband's death, patient has discovered information that indicate her husband may have had a child by another woman. 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 She has discovered significant unexplained expenses as well as seen a picture of a girl on her husband's Gmail and Skype accounts. The name on the picture has the same last name as the patient's husband's last name. Patient is distraught and fears that her 80 year marriage may have been a lie. She reports that her husband had an affair many years ago but says nothing was mentioned about the possibility of her husband having a child outside of their marriage. Patient has hired a Photographer. She reports additional stress and instability regarding finances as preliminary information seems to suggest that husband may have changed the will per patient's report. She also reports stress related to an estranged relationship with her 22 year old son from whom she has had no contact since her husband's funeral. Patient reports additional stress related to recently being stalked by a female member of the [facility]. She reports he has been calling her and riding through her  neighborhood. He attended the church she attends and told ushers he was her visitor. Patient is seen for a followup appointment today   Interventions Strategy:  Supportive therapy  Participation Level:   Active  Participation Quality:  Sharing      Behavioral Observation:  Casual, Alert, and Tearful.   Current Psychosocial Factors: The patient continues to have grief and loss issues regarding her father and her husband. Patient reports finding more information that seems to indicate her husband may have a child outside the marriage.  Content of Session:   Reviewing symptoms, establishing rapport, processing feelings, practicing relaxation techniques using diaphragmatic breathing  Current Status:    Patient continues to report depressed mood, crying spells, loss of appetite, memory difficulty, loss of interest in activities, excessive worrying, racing thoughts, and poor concentration.   Patient Progress:   Fair. Patient reports no no change in symptoms since last session. She has begun taking medication consistently and says that it may be helping a little. Patient has maintained contact with her daughter. However, patient reports feeling alone as her daughter works full time and has her life. She expresses sadness, disappointment, and frustration that she does not receive more support from her biological family. She states being there for her siblings in the past but now deciding that she has to take care of self.  She continues to experience conformation and sadness regarding her husband as she reports he always provided for her and had her best interest at heart. Patient reports constantly thinking about her husband and looking at  information. Therapist works with patient to identify ways to limit the amount of time she views information and to identify distracting activities.  Therapist also works with patient to practice a relaxation techniques using diaphragmatic breathing.  Target  Goals:   Establishing rapport  Last Reviewed:     Goals Addressed Today:    Establishing rapport  Impression/Diagnosis:   The patient presents with symptoms of anxiety and depression that developed as she was coping with the deaths of her father and husband and discovered information that indicate her husband may have had a child by another woman. Her current symptoms include depressed mood, crying spells, loss of appetite, sleep difficulty, memory difficulty, loss of interest in activities, excessive worrying, racing thoughts, and poor concentration. Diagnosis: adjustment disorder with mixed anxiety and depressed mood   Diagnosis:  Axis I:  1. Adjustment disorder with mixed anxiety and depressed mood             Axis II: Deferred

## 2012-11-14 NOTE — Patient Instructions (Signed)
Discussed orally 

## 2012-11-20 ENCOUNTER — Ambulatory Visit (INDEPENDENT_AMBULATORY_CARE_PROVIDER_SITE_OTHER): Payer: Federal, State, Local not specified - PPO | Admitting: Psychiatry

## 2012-11-20 DIAGNOSIS — F4323 Adjustment disorder with mixed anxiety and depressed mood: Secondary | ICD-10-CM

## 2012-11-20 NOTE — Progress Notes (Signed)
Patient:  Lori [name]   DOB: October 09, 1951  MR Number: [id number]  Location: [facility]:  [address] [place],  [place], [address]  Start: Tuesday 11/20/2012 10:00 AM End: Tuesday 11/20/2012 10:50 AM  Provider/Observer:     Florencia Reasons, MSW, LCSW   Chief Complaint:      Chief Complaint  Patient presents with  . Depression    Reason For Service:     The patient was referred for services by PA Sherie Don due to patient experiencing anxiety and depression. Per patient's report, her father died in 05/23/2013and her husband died suddenly 3 months later. Patient reports attending the [facility] and coping with grief and loss issues. However, in the course of managing financial issues since her husband's death, patient has discovered information that indicate her husband may have had a child by another woman. 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 She has discovered significant unexplained expenses as well as seen a picture of a girl on her husband's Gmail and Skype accounts. The name on the picture has the same last name as the patient's husband's last name. Patient is distraught and fears that her 60 year marriage may have been a lie. She reports that her husband had an affair many years ago but says nothing was mentioned about the possibility of her husband having a child outside of their marriage. Patient has hired a Photographer. She reports additional stress and instability regarding finances as preliminary information seems to suggest that husband may have changed the will per patient's report. She also reports stress related to an estranged relationship with her 60 year old son from whom she has had no contact since her husband's funeral. Patient reports additional stress related to recently being stalked by a female member of the [facility]. She reports he has been calling her and riding through her  neighborhood. He attended the church she attends and told ushers he was her visitor. Patient is seen for a followup appointment today   Interventions Strategy:  Supportive therapy  Participation Level:   Active  Participation Quality:  Sharing      Behavioral Observation:  Casual, Alert, and Tearful.   Current Psychosocial Factors: The patient continues to have grief and loss issues regarding her father and her husband. Patient reports finding more information that seems to indicate her husband may have a child outside the marriage.  Content of Session:   Reviewing symptoms, processing feelings, reinforcing patient's efforts to improve self-care  Current Status:    Patient continues to report depressed mood, loss of appetite, excessive worrying, but improved concentration, improved sleep pattern ( 6-7 hours per night) decreased crying spells, and increased involvement in activity.   Patient Progress:   Fair. Patient reports improved sleep pattern and decreased racing thoughts since taking increased dosage of medication. She continues to experience sadness and grief regarding her husband along with continued confusion regarding information discovered since his death. Patient's daughter now is residing with patient. However, per patient's report, she doesn't see daughter frequently due to to daughter's work hours. However patient and daughter regularly attending church together on Sunday. She also reports calls from various relatives and acquaintances. Patient also reports trying to improve physical activity and states walking yesterday for 90 minutes. She also has been . trying to cope by reading. Therapist continues to work with patient to process grief and loss issues. Patient discusses her loneliness.    Target  Goals:   Decrease  anxiety  Last Reviewed:     Goals Addressed Today:    Decrease anxiety  Impression/Diagnosis:   The patient presents with symptoms of anxiety and depression that  developed as she was coping with the deaths of her father and husband and discovered information that indicate her husband may have had a child by another woman. Her current symptoms include depressed mood, crying spells, loss of appetite, sleep difficulty, memory difficulty, loss of interest in activities, excessive worrying, racing thoughts, and poor concentration. Diagnosis: adjustment disorder with mixed anxiety and depressed mood   Diagnosis:  Axis I:  1. Adjustment disorder with mixed anxiety and depressed mood             Axis II: Deferred

## 2012-11-20 NOTE — Patient Instructions (Signed)
Discussed orally 

## 2012-11-27 ENCOUNTER — Ambulatory Visit (INDEPENDENT_AMBULATORY_CARE_PROVIDER_SITE_OTHER): Payer: Federal, State, Local not specified - PPO | Admitting: Psychiatry

## 2012-11-27 DIAGNOSIS — F4323 Adjustment disorder with mixed anxiety and depressed mood: Secondary | ICD-10-CM

## 2012-11-27 NOTE — Patient Instructions (Signed)
Discussed orally 

## 2012-11-27 NOTE — Progress Notes (Signed)
Patient:  Lori [name]   DOB: 1951/03/17  MR Number: [id number]  Location: [facility]:  [address] [place],  [place], [address]  Start: Tuesday 11/27/2012 3:00 PM End: Tuesday 11/27/2012 3:50 PM  Provider/Observer:     Florencia Reasons, MSW, LCSW   Chief Complaint:      Chief Complaint  Patient presents with  . Depression  . Anxiety    Reason For Service:     The patient was referred for services by PA Sherie Don due to patient experiencing anxiety and depression. Per patient's report, her father died in 22-May-2013and her husband died suddenly 3 months later. Patient reports attending the [facility] and coping with grief and loss issues. However, in the course of managing financial issues since her husband's death, patient has discovered information that indicate her husband may have had a child by another woman. 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 She has discovered significant unexplained expenses as well as seen a picture of a girl on her husband's Gmail and Skype accounts. The name on the picture has the same last name as the patient's husband's last name. Patient is distraught and fears that her 43 year marriage may have been a lie. She reports that her husband had an affair many years ago but says nothing was mentioned about the possibility of her husband having a child outside of their marriage. Patient has hired a Photographer. She reports additional stress and instability regarding finances as preliminary information seems to suggest that husband may have changed the will per patient's report. She also reports stress related to an estranged relationship with her 60 year old son from whom she has had no contact since her husband's funeral. Patient reports additional stress related to recently being stalked by a female member of the [facility]. She reports he has been calling her and riding through her  neighborhood. He attended the church she attends and told ushers he was her visitor. Patient is seen for a followup appointment today   Interventions Strategy:  Supportive therapy  Participation Level:   Active  Participation Quality:  Sharing      Behavioral Observation:  Casual, Alert, and Tearful.   Current Psychosocial Factors: The patient continues to have grief and loss issues regarding her father and her husband. Patient reports receiving information yesterday from a private investigator that seems to indicate patient's husband was having an affair and was addicted to pornography.  Content of Session:   Reviewing symptoms, processing feelings, identifying ways to improve assertiveness skills and ability to set and maintain boundaries  Current Status:    Patient continues to report depressed mood, loss of appetite, excessive worrying, but continued involvement involvement in activity.  However, patient reports experiencing increased crying spells during the weekend.  Patient Progress:   Fair. Patient reports becoming very upset at church 03/15/23 during choir rehearsal as  fellow church members made comments about patient's weight loss and her grief response. Patient expresses frustration as church members as well as extended family members along with some of her husband's friends and acquaintances are constantly calling trying to talk to patient or trying to engage her in activities. Patient expresses frustration as she says people will not leave her alone. Therapist works with patient to process her feelings and to identify ways to improve assertiveness skills as well as ways to set and maintain boundaries. Therapist and patient also discussed the possibility of patient  visiting another church as patient has strong spiritual beliefs but feels overwhelmed when she attends her church. Patient reports becoming very upset yesterday as a Photographer share information this seems to  indicate her husband was having an affair and was addicted to pornography. However, there is no indication that patient's husband  had a child outside their marriage. Therapist works with patient to process her feelings. Patient expresses confusion, frustration, and anger.   Target Goals:   Decrease  anxiety  Last Reviewed:     Goals Addressed Today:    Decrease anxiety  Impression/Diagnosis:   The patient presents with symptoms of anxiety and depression that developed as she was coping with the deaths of her father and husband and discovered information that indicate her husband may have had a child by another woman. Her current symptoms include depressed mood, crying spells, loss of appetite, sleep difficulty, memory difficulty, loss of interest in activities, excessive worrying, racing thoughts, and poor concentration. Diagnosis: Adjustment Disorder with Mixed Anxiety and Depressed Mood.   Diagnosis:  Axis I:  1. Adjustment disorder with mixed anxiety and depressed mood             Axis II: Deferred

## 2012-12-03 ENCOUNTER — Ambulatory Visit (HOSPITAL_COMMUNITY): Payer: Self-pay | Admitting: Psychiatry

## 2012-12-07 ENCOUNTER — Ambulatory Visit (HOSPITAL_COMMUNITY): Payer: Self-pay | Admitting: Psychiatry

## 2012-12-13 ENCOUNTER — Ambulatory Visit (INDEPENDENT_AMBULATORY_CARE_PROVIDER_SITE_OTHER): Payer: Federal, State, Local not specified - PPO | Admitting: Psychiatry

## 2012-12-13 DIAGNOSIS — F4323 Adjustment disorder with mixed anxiety and depressed mood: Secondary | ICD-10-CM

## 2012-12-13 NOTE — Patient Instructions (Signed)
Discussed orally 

## 2012-12-13 NOTE — Progress Notes (Signed)
Patient:  Lori [name]   DOB: 06-Sep-1951  MR Number: [id number]  Location: [facility]:  [address] [place],  [place], [address]  Start: Thursday 12/13/2012 11:00 AM End: Thursday 12/13/2012 11:50 AM  Provider/Observer:     Florencia Reasons, MSW, LCSW   Chief Complaint:      Chief Complaint  Patient presents with  . Depression  . Anxiety    Reason For Service:     The patient was referred for services by PA Sherie Don due to patient experiencing anxiety and depression. Per patient's report, her father died in May 16, 2013and her husband died suddenly 3 months later. Patient reports attending the [facility] and coping with grief and loss issues. However, in the course of managing financial issues since her husband's death, patient has discovered information that indicate her husband may have had a child by another woman. 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 She has discovered significant unexplained expenses as well as seen a picture of a girl on her husband's Gmail and Skype accounts. The name on the picture has the same last name as the patient's husband's last name. Patient is distraught and fears that her 100 year marriage may have been a lie. She reports that her husband had an affair many years ago but says nothing was mentioned about the possibility of her husband having a child outside of their marriage. Patient has hired a Photographer. She reports additional stress and instability regarding finances as preliminary information seems to suggest that husband may have changed the will per patient's report. She also reports stress related to an estranged relationship with her 31 year old son from whom she has had no contact since her husband's funeral. Patient reports additional stress related to recently being stalked by a female member of the [facility]. She reports he has been calling her and riding through  her neighborhood. He attended the church she attends and told ushers he was her visitor. Patient is seen for a followup appointment today.   Interventions Strategy:  Supportive therapy  Participation Level:   Active  Participation Quality:  Sharing      Behavioral Observation:  Casual, Alert, and Tearful.   Current Psychosocial Factors: The patient continues to have grief and loss issues regarding her father and her husband.   Content of Session:   Reviewing symptoms, developing treatment plan, processing grief and loss issues, identifying ways to improve self-care  Current Status:    Patient continues to report depressed mood, crying spells, loss of appetite, poor concentration, memory difficulty, and excessive worrying.   Patient Progress:   Fair. Patient reports increased stress along with depressed mood and crying spells last week due to being isolated at her home for 5 days during the increment weather and facing her recent wedding anniversary as well as the Valentine's Day holiday without her husband. She saw her primary care physician yesterday who increased her citalopram to 30 mg per day. Therapist works with patient to develop a treatment plan. Therapist also works with patient to process grief and loss issues. Therapist works with patient to identify realistic expectations of self. Therapist and patient explore coping techniques and discussed the possibility of patient taking a mini vacation. Therapist also encourages patient to improve self-care regarding consistent exercise.   Target Goals:  1. Process and resolve grief and loss issues: 1:1 psychotherapy one time every 1-4 weeks (supportive, cognitive behavioral therapy)   2.  Decrease anxiety and excessive worrying:  1:1 psychotherapy one time every 1-4 weeks (supportive, cognitive behavioral therapy)   3. Resume normal interest in activities, decrease crying spells: 1:1 psychotherapy one time every 1-4 weeks (supportive, cognitive  behavioral therapy)   4. Improve assertiveness skills and the ability to set and maintain boundaries: 1:1 psychotherapy one time every 1-4 weeks (supportive, cognitive behavioral therapy)  Last Reviewed: 12/13/2012    Goals Addressed Today:   Goals 1 and 2  Impression/Diagnosis:   The patient presents with symptoms of anxiety and depression that developed as she was coping with the deaths of her father and husband and discovered information that indicate her husband may have had a child by another woman. Her current symptoms include depressed mood, crying spells, loss of appetite, sleep difficulty, memory difficulty, loss of interest in activities, excessive worrying, racing thoughts, and poor concentration. Diagnosis: Adjustment Disorder with Mixed Anxiety and Depressed Mood.   Diagnosis:  Axis I:  Adjustment disorder with mixed anxiety and depressed mood          Axis II: Deferred

## 2012-12-20 ENCOUNTER — Ambulatory Visit (INDEPENDENT_AMBULATORY_CARE_PROVIDER_SITE_OTHER): Payer: Federal, State, Local not specified - PPO | Admitting: Psychiatry

## 2012-12-20 DIAGNOSIS — F4323 Adjustment disorder with mixed anxiety and depressed mood: Secondary | ICD-10-CM

## 2012-12-20 NOTE — Progress Notes (Signed)
Patient:  Lori [name]   DOB: 13-Apr-1951  MR Number: [id number]  Location: [facility]:  [address] [place],  [place], [address]  Start: Thursday 12/20/2012 11:00 AM End: Thursday 12/20/2012 11:50 AM  Provider/Observer:     Florencia Reasons, MSW, LCSW   Chief Complaint:      Chief Complaint  Patient presents with  . Depression    Reason For Service:     The patient was referred for services by PA Sherie Don due to patient experiencing anxiety and depression. Per patient's report, her father died in May 17, 2013and her husband died suddenly 3 months later. Patient reports attending the [facility] and coping with grief and loss issues. However, in the course of managing financial issues since her husband's death, patient has discovered information that indicate her husband may have had a child by another woman. 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 She has discovered significant unexplained expenses as well as seen a picture of a girl on her husband's Gmail and Skype accounts. The name on the picture has the same last name as the patient's husband's last name. Patient is distraught and fears that her 60 year marriage may have been a lie. She reports that her husband had an affair many years ago but says nothing was mentioned about the possibility of her husband having a child outside of their marriage. Patient has hired a Photographer. She reports additional stress and instability regarding finances as preliminary information seems to suggest that husband may have changed the will per patient's report. She also reports stress related to an estranged relationship with her 30 year old son from whom she has had no contact since her husband's funeral. Patient reports additional stress related to recently being stalked by a female member of the [facility]. She reports he has been calling her and riding through her  neighborhood. He attended the church she attends and told ushers he was her visitor. Patient is seen for a followup appointment today.   Interventions Strategy:  Supportive therapy  Participation Level:   Active  Participation Quality:  Sharing      Behavioral Observation:  Casual, Alert, and talkative  Current Psychosocial Factors: The patient continues to have grief and loss issues regarding  her husband.   Content of Session:   Reviewing symptoms, reinforcing patient's efforts to improve self-care, exploring relaxation and coping techniques and ways to increase social involvement  Current Status:    Patient continues to report less depressed mood, increased motivation, and decreased crying spells but continued loss of appetite, poor concentration, memory difficulty, and excessive worrying.   Patient Progress:   Fair. Patient reports increased self-care efforts and reports walking 5 out of the last 7 days. She also reports increased social contact as she has been walking with her neighbors and reports recently enjoying a visit from a friend. She accompanied a friend to [place] to an appointment. She also recently attended her grandson's basketball game. She is scheduled to walk with a friend and go to a hair appointment tomorrow. Patient reports feeling better and stronger this week. She reports finding a Publishing copy and DVD player in her home this week while cleaning out a closet and now has more questions regarding her husband's behavior.  She also has found additional information on husband's computer indicating that he has used questionable websites. Although bothered by this information, patient is not overwhelmed by this information today. Therapist  works with patient to process her feelings, reinforce her efforts to improve self-care, and encourages patient to continue to engage in activities and social interaction.  Therapist also encourages patient to journal.   Target  Goals:  1. Process and resolve grief and loss issues: 1:1 psychotherapy one time every 1-4 weeks (supportive, cognitive behavioral therapy)   2. Decrease anxiety and excessive worrying:  1:1 psychotherapy one time every 1-4 weeks (supportive, cognitive behavioral therapy)   3. Resume normal interest in activities, decrease crying spells: 1:1 psychotherapy one time every 1-4 weeks (supportive, cognitive behavioral therapy)   4. Improve assertiveness skills and the ability to set and maintain boundaries: 1:1 psychotherapy one time every 1-4 weeks (supportive, cognitive behavioral therapy)  Last Reviewed: 12/13/2012    Goals Addressed Today:   Goals  2 and 3  Impression/Diagnosis:   The patient presents with symptoms of anxiety and depression that developed as she was coping with the deaths of her father and husband and discovered information that indicate her husband may have had a child by another woman. Her current symptoms include depressed mood, crying spells, loss of appetite, sleep difficulty, memory difficulty, loss of interest in activities, excessive worrying, racing thoughts, and poor concentration. Diagnosis: Adjustment Disorder with Mixed Anxiety and Depressed Mood.   Diagnosis:  Axis I:  Adjustment disorder with mixed anxiety and depressed mood          Axis II: Deferred

## 2012-12-20 NOTE — Patient Instructions (Signed)
Discussed orally 

## 2012-12-26 ENCOUNTER — Ambulatory Visit (INDEPENDENT_AMBULATORY_CARE_PROVIDER_SITE_OTHER): Payer: Federal, State, Local not specified - PPO | Admitting: Psychiatry

## 2012-12-26 DIAGNOSIS — F4323 Adjustment disorder with mixed anxiety and depressed mood: Secondary | ICD-10-CM

## 2012-12-26 NOTE — Progress Notes (Signed)
Patient:  Lori [name]   DOB: 09-15-1951  MR Number: [id number]  [facility]: [facility]:  [address] [place] [place],  [place], [address]  Start: Wednesday 12/26/2012 1:10 PM End: Wednesday 12/26/2012 2:00 PM  Provider/Observer:     Florencia Reasons, MSW, LCSW   Chief Complaint:      Chief Complaint  Patient presents with  . Depression  . Anxiety    Reason For Service:     The patient was referred for services by PA Sherie Don due to patient experiencing anxiety and depression. Per patient's report, her father died in 19-May-2013and her husband died suddenly 3 months later. Patient reports attending the [facility] and coping with grief and loss issues. However, in the course of managing financial issues since her husband's death, patient has discovered information that indicate her husband may have had a child by another woman. 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 She has discovered significant unexplained expenses as well as seen a picture of a girl on her husband's Gmail and Skype accounts. The name on the picture has the same last name as the patient's husband's last name. Patient is distraught and fears that her 82 year marriage may have been a lie. She reports that her husband had an affair many years ago but says nothing was mentioned about the possibility of her husband having a child outside of their marriage. Patient has hired a Photographer. She reports additional stress and instability regarding finances as preliminary information seems to suggest that husband may have changed the will per patient's report. She also reports stress related to an estranged relationship with her 60 year old son from whom she has had no contact since her husband's funeral. Patient reports additional stress related to recently being stalked by a female member of the [facility]. She reports he has been calling her and riding through her  neighborhood. He attended the church she attends and told ushers he was her visitor. Patient is seen for a followup appointment today.   Interventions Strategy:  Supportive therapy, cognitive behavioral therapy  Participation Level:   Active  Participation Quality:  Sharing      Behavioral Observation:  Casual, Alert, and talkative  Current Psychosocial Factors: The patient continues to have grief and loss issues regarding  her husband and recently found more information regarding husband's possible business transactions  Content of Session:   Reviewing symptoms, processing grief and loss issues, identifying patient's strengths in previous adversity, working with patient to identify ways to increase structure in her day, reviewing relaxation technique.  Current Status:    Patient continues to report less depressed mood, increased motivation, and decreased crying spells but continued loss of appetite, poor concentration, memory difficulty, and excessive worrying.   Patient Progress:   Fair. Patient reports continued  self-care efforts but decreased activity due to inclement weather. She reports having good day and bad days. 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 Therapist works with patient to identify ways to limit the amount of time and energy she expands on business matters setting actual physical and emotional boundaries. Therapist also works with patient to identify ways to increase structure in patient's day. Therapist and patient also discuss patient's past adversities and identify strengths patient used that she can use  in her current situation.  Target Goals:  1. Process and resolve grief and loss issues: 1:1 psychotherapy one time every 1-4 weeks (supportive, cognitive behavioral therapy)   2. Decrease anxiety and excessive worrying:  1:1  psychotherapy one time every 1-4 weeks (supportive, cognitive behavioral therapy)   3. Resume normal interest in activities, decrease crying spells: 1:1 psychotherapy one time every 1-4 weeks (supportive, cognitive behavioral therapy)   4. Improve assertiveness skills and the ability to set and maintain boundaries: 1:1 psychotherapy one time every 1-4 weeks (supportive, cognitive behavioral therapy)  Last Reviewed: 12/13/2012    Goals Addressed Today:   Goals  2 and 3  Impression/Diagnosis:   The patient presents with symptoms of anxiety and depression that developed as she was coping with the deaths of her father and husband and discovered information that indicate her husband may have had a child by another woman. Her current symptoms include depressed mood, crying spells, loss of appetite, sleep difficulty, memory difficulty, loss of interest in activities, excessive worrying, racing thoughts, and poor concentration. Diagnosis: Adjustment Disorder with Mixed Anxiety and Depressed Mood.   Diagnosis:  Axis I:  Adjustment disorder with mixed anxiety and depressed mood          Axis II: Deferred

## 2012-12-26 NOTE — Patient Instructions (Addendum)
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

## 2013-01-03 ENCOUNTER — Ambulatory Visit (HOSPITAL_COMMUNITY): Payer: Self-pay | Admitting: Psychiatry

## 2013-01-08 ENCOUNTER — Encounter: Payer: Self-pay | Admitting: *Deleted

## 2013-01-09 ENCOUNTER — Ambulatory Visit (INDEPENDENT_AMBULATORY_CARE_PROVIDER_SITE_OTHER): Payer: Federal, State, Local not specified - PPO | Admitting: Psychiatry

## 2013-01-09 DIAGNOSIS — F4323 Adjustment disorder with mixed anxiety and depressed mood: Secondary | ICD-10-CM

## 2013-01-09 NOTE — Patient Instructions (Signed)
Discussed orally 

## 2013-01-09 NOTE — Progress Notes (Addendum)
Patient:  Lori [name]   DOB: 1951/06/08  MR Number: [id number]  Location: [facility]:  [address] [place] [place],  [place], [address]  Start: Wednesday 01/09/2013 2:00 PM End: Wednesday 01/09/2013 2:50 PM  Provider/Observer:     Florencia Reasons, MSW, LCSW   Chief Complaint:      Chief Complaint  Patient presents with  . Anxiety  . Depression    Reason For Service:     The patient was referred for services by PA Sherie Don due to patient experiencing anxiety and depression. Per patient's report, her father died in 06/10/13and her husband died suddenly 3 months later. Patient reports attending the [facility] and coping with grief and loss issues. However, in the course of managing financial issues since her husband's death, patient has discovered information that indicate her husband may have had a child by another woman. 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 She has discovered significant unexplained expenses as well as seen a picture of a girl on her husband's Gmail and Skype accounts. The name on the picture has the same last name as the patient's husband's last name. Patient is distraught and fears that her 60 year marriage may have been a lie. She reports that her husband had an affair many years ago but says nothing was mentioned about the possibility of her husband having a child outside of their marriage. Patient has hired a Photographer. She reports additional stress and instability regarding finances as preliminary information seems to suggest that husband may have changed the will per patient's report. She also reports stress related to an estranged relationship with her 60 year old son from whom she has had no contact since her husband's funeral. Patient reports additional stress related to recently being stalked by a female member of the [facility]. She reports he has been calling her and riding through  her neighborhood. He attended the church she attends and told ushers he was her visitor. Patient is seen for a followup appointment today.   Interventions Strategy:  Supportive therapy, cognitive behavioral therapy  Participation Level:   Active  Participation Quality:  Sharing      Behavioral Observation:  Casual, Alert, and talkative, smiles frequently today  Current Psychosocial Factors:   Content of Session:   Reviewing symptoms, processing feelings, reinforcing patient's efforts to increase physical activity, improve self-care, and increased social involvement  Current Status:    Patient reports improved mood, increased motivation, increased appetite, decreased worrying, positive sleep pattern, and resuming normal interest in activities.  Patient Progress:   Good. Patient reports much improvement since last session. She is taking an increased dosage of Celexa and has discontinued taking Ambien. She continues to see PA Sherie Don for medication management. Patient reports less worry and anxiety about business matters and her husband's past possible behavior as she has limited the amount of time and energy she expends on business matters by setting  physical and emotional boundaries. She reports now focusing  on positive memories about her husband and is beginning a project of [employer] of her and her husband as a couple. She has maintained a daily walking routine weather permitting. Patient has increased structure in her day. She has increased social involvement and has been attending church and visiting family and friends. Patient also has begun taking a computer class. Patient states now having the desire and motivation to do things again.    Target Goals:  1. Process and resolve grief and loss issues: 1:1 psychotherapy one time every 1-4 weeks (supportive, cognitive behavioral therapy)   2. Decrease anxiety and excessive worrying:  1:1 psychotherapy one time every 1-4 weeks  (supportive, cognitive behavioral therapy)   3. Resume normal interest in activities, decrease crying spells: 1:1 psychotherapy one time every 1-4 weeks (supportive, cognitive behavioral therapy)   4. Improve assertiveness skills and the ability to set and maintain boundaries: 1:1 psychotherapy one time every 1-4 weeks (supportive, cognitive behavioral therapy)  Last Reviewed: 12/13/2012    Goals Addressed Today:   Goals  2 and 3  Impression/Diagnosis:   The patient presents with symptoms of anxiety and depression that developed as she was coping with the deaths of her father and husband and discovered information that indicate her husband may have had a child by another woman. Her current symptoms include depressed mood, crying spells, loss of appetite, sleep difficulty, memory difficulty, loss of interest in activities, excessive worrying, racing thoughts, and poor concentration. Diagnosis: Adjustment Disorder with Mixed Anxiety and Depressed Mood.   Diagnosis:  Axis I:  Adjustment disorder with mixed anxiety and depressed mood          Axis II: Deferred

## 2013-01-11 ENCOUNTER — Ambulatory Visit: Payer: Self-pay | Admitting: Family Medicine

## 2013-01-16 ENCOUNTER — Encounter: Payer: Self-pay | Admitting: Family Medicine

## 2013-01-16 ENCOUNTER — Ambulatory Visit (INDEPENDENT_AMBULATORY_CARE_PROVIDER_SITE_OTHER): Payer: Federal, State, Local not specified - PPO | Admitting: Family Medicine

## 2013-01-16 VITALS — BP 156/94 | Ht 64.0 in | Wt 150.4 lb

## 2013-01-16 DIAGNOSIS — F32A Depression, unspecified: Secondary | ICD-10-CM

## 2013-01-16 DIAGNOSIS — F329 Major depressive disorder, single episode, unspecified: Secondary | ICD-10-CM

## 2013-01-16 DIAGNOSIS — R5383 Other fatigue: Secondary | ICD-10-CM

## 2013-01-16 DIAGNOSIS — G4700 Insomnia, unspecified: Secondary | ICD-10-CM

## 2013-01-16 DIAGNOSIS — R5381 Other malaise: Secondary | ICD-10-CM

## 2013-01-16 HISTORY — DX: Depression, unspecified: F32.A

## 2013-01-16 NOTE — Progress Notes (Deleted)
  Subjective:    Patient ID: DEAVEN BARRON, female    DOB: 12-08-1950, 62 y.o.   MRN: [id number]  HPI    Review of Systems     Objective:   Physical Exam        Assessment & Plan:   Subjective:   NIYAH MAMARIL is an 62 y.o. female who presents for evaluation and treatment of depressive symptoms.  Onset approximately {number:19994} {units:19995} ago, {course:19996} since that time.  Current symptoms include {symptoms:20000}.  Current treatment for depression:{DEPRESSION TREATMENT:20002} Sleep problems: {(BH) ABSENT/MARKED:20013}   Early awakening:{(BH) ABSENT/MARKED:20013}   Energy: {GOOD/POOR:20011} Motivation: {GOOD/POOR:20011} Concentration: {GOOD/POOR:20011} Rumination/worrying: {(BH) ABSENT/MARKED:20013} Memory: {GOOD/POOR:20011} Tearfulness: {(BH) ABSENT/MARKED:20013}  Anxiety: {(BH) ABSENT/MARKED:20013}  Panic: {(BH) ABSENT/MARKED:20013}  Overall Mood: {(BH) RANGE IMPROVED /WORSE:20012}  Hopelessness: {(BH) ABSENT/MARKED:20013} Suicidal ideation: {(BH) ABSENT/MARKED:20017}  Other/Psychosocial Stressors: *** Family history positive for depression in the patient's {family members:19997}.  Previous treatment modalities employed include {treatment:20002}.  Past episodes of depression:*** Organic causes of depression present: {drug abuse:20001}.  Review of Systems {ros - complete:30496}   Objective:   Mental Status Examination: Posture and motor behavior: {NEGATIVE/APPRO/POSITIVE FOR:20006} Dress, grooming, personal hygiene: {NEGATIVE/APPRO/POSITIVE FOR:20006} Facial expression: {NEGATIVE/APPRO/POSITIVE FOR:20006} Speech: {NEGATIVE/APPRO/POSITIVE FOR:20006} Mood: {NEGATIVE/APPRO/POSITIVE FOR:20006} Coherency and relevance of thought: {NEGATIVE/APPRO/POSITIVE FOR:20006} Thought content: {NEGATIVE/APPRO/POSITIVE FOR:20006} Perceptions: {NEGATIVE/APPRO/POSITIVE FOR:20006} Orientation:{NEGATIVE/APPRO/POSITIVE FOR:20006} Attention and concentration:  {NEGATIVE/APPRO/POSITIVE FOR:20006} Memory: : {NEGATIVE/APPRO/POSITIVE FOR:20006} Information: {neg/pos:19998} Vocabulary: {NEGATIVE/APPRO/POSITIVE FOR:20006} Abstract reasoning: {NEGATIVE/APPRO/POSITIVE FOR:20006} Judgment: {NEGATIVE/APPRO/POSITIVE FOR:20006}    Assessment:   Experiencing the following symptoms of depression most of the day nearly every day for more than two consecutive weeks: {DSM-IV depression criteria:20018}  Depressive Disorder:***  Suicide Risk Assessment:  Suicidal intent: *** Suicidal plan: *** Access to means for suicide: *** Lethality of means for suicide: *** Prior suicide attempts: *** Recent exposure to suicide:***   Plan:   No diagnosis found.  Reviewed concept of depression as biochemical imbalance of neurotransmitters and rationale for treatment. Instructed patient to contact office or on-call physician promptly should condition worsen or any new symptoms appear and provided on-call telephone numbers.

## 2013-01-16 NOTE — Progress Notes (Signed)
  Subjective:    Patient ID: Lori [name], female    DOB: [birth year], 62 y.o.   MRN: [id number]  HPI patient comes in for a discussion primarily regarding her mood, concerns regarding her husband's death, and many other concerns. Please see chart. Patient has had protracted grief. She is struggling a lot with this. She was clearly close to her husband. She is extremely puzzled about the nature of his death. I read his entire hospital summary. Please see below. Patient not suicidal. She reports that increased levels of Celexa along with weekly counseling seems to be helpful. No suicidal thoughts. A lot of stress within the family.    Review of Systems otherwise negative.     Objective:   Physical Exam  Not performed      Assessment & Plan:  Impression depression with protracted grief. Nearly one hour spent discussing all this with the patient. There are uncertain these about the exact cause of her husband's death. Patient was advised this may always be the case. He had diminished immune system post treatment for cancer. He is admitted for extraordinarily high fevers. He was treated very aggressively and appropriately and in my opinion. The exact etiology of his fever was never discovered. Patient eventually went into cardiac arrest. Plan regular exercise a very good idea. Maintain same medications. Maintain weekly counseling. Maintain followup with [place] schedule. Nearly one hour spent, most in discussion. WSL

## 2013-01-25 ENCOUNTER — Ambulatory Visit (HOSPITAL_COMMUNITY): Payer: Self-pay | Admitting: Psychiatry

## 2013-01-29 ENCOUNTER — Ambulatory Visit (HOSPITAL_COMMUNITY): Payer: Self-pay | Admitting: Psychiatry

## 2013-02-04 ENCOUNTER — Ambulatory Visit (INDEPENDENT_AMBULATORY_CARE_PROVIDER_SITE_OTHER): Payer: Federal, State, Local not specified - PPO | Admitting: Psychiatry

## 2013-02-04 DIAGNOSIS — F4323 Adjustment disorder with mixed anxiety and depressed mood: Secondary | ICD-10-CM

## 2013-02-04 NOTE — Progress Notes (Signed)
Patient:  Lori [name]   DOB: Sep 01, 1951  MR Number: [id number]  Location: [facility]:  [address] [place],  [place], [address]  Start: Monday 02/04/2013 1:00 PM End: Monday 02/04/2013 1:45 PM  Provider/Observer:     Florencia Reasons, MSW, LCSW   Chief Complaint:      Chief Complaint  Patient presents with  . Anxiety  . Depression    Reason For Service:     The patient was referred for services by PA Sherie Don due to patient experiencing anxiety and depression. Per patient's report, her father died in 06-04-2013and her husband died suddenly 3 months later. Patient reports attending the [facility] and coping with grief and loss issues. However, in the course of managing financial issues since her husband's death, patient has discovered information that indicate her husband may have had a child by another woman. 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 She has discovered significant unexplained expenses as well as seen a picture of a girl on her husband's Gmail and Skype accounts. The name on the picture has the same last name as the patient's husband's last name. Patient is distraught and fears that her 31 year marriage may have been a lie. She reports that her husband had an affair many years ago but says nothing was mentioned about the possibility of her husband having a child outside of their marriage. Patient has hired a Photographer. She reports additional stress and instability regarding finances as preliminary information seems to suggest that husband may have changed the will per patient's report. She also reports stress related to an estranged relationship with her 64 year old son from whom she has had no contact since her husband's funeral. Patient reports additional stress related to recently being stalked by a female member of the [facility]. She reports he has been calling her and riding through her  neighborhood. He attended the church she attends and told ushers he was her visitor. Patient is seen for a followup appointment today.   Interventions Strategy:  Supportive therapy, cognitive behavioral therapy  Participation Level:   Active  Participation Quality:  Sharing      Behavioral Observation:  Casual, Alert, and talkative, tearful  Current Psychosocial Factors: Patient 's grandson's other grandmother died and patient attended the family visitation.  Content of Session:   Reviewing symptoms, processing feelings, encouraging patient to resume routine and schedule regarding self-care, identify compensatory tools for memory difficulty  Current Status:    Patient reports increased memory difficulty and anxiety along with increased sadness but overall doing fairly well  Patient Progress:   Good. Patient reports her grandsons other grandmother died 2 weeks ago. Attending the visitation triggered increased thoughts about the loss of patient's father and her husband. Patient reports starting to feel really down after the visitation. She reports she kept her grandson for about a week and enjoying this but neglecting self-care. Therapist works with patient to identify ways to resume routine and schedule as well as ways to improve self-care. Patient is pleased her appetite has increased. She also has maintained social involvement and continues to have increased motivation. Patient is starting to experience some anxiety about June 2 to the upcoming Father's Day holiday as well as patient's birthday. She also expresses anxiety about facing her first [place] without her husband but plans to attend church. Therapist works with patient to process feelings and began to identify ways to acknowledge losses  and to begin to identify memorializations.    Target Goals:  1. Process and resolve grief and loss issues: 1:1 psychotherapy one time every 1-4 weeks (supportive, cognitive behavioral therapy)   2.  Decrease anxiety and excessive worrying:  1:1 psychotherapy one time every 1-4 weeks (supportive, cognitive behavioral therapy)   3. Resume normal interest in activities, decrease crying spells: 1:1 psychotherapy one time every 1-4 weeks (supportive, cognitive behavioral therapy)   4. Improve assertiveness skills and the ability to set and maintain boundaries: 1:1 psychotherapy one time every 1-4 weeks (supportive, cognitive behavioral therapy)  Last Reviewed: 12/13/2012    Goals Addressed Today:   Goals  1,2, and 3  Impression/Diagnosis:   The patient presents with symptoms of anxiety and depression that developed as she was coping with the deaths of her father and husband and discovered information that indicate her husband may have had a child by another woman. Her current symptoms include depressed mood, crying spells, loss of appetite, sleep difficulty, memory difficulty, loss of interest in activities, excessive worrying, racing thoughts, and poor concentration. Diagnosis: Adjustment Disorder with Mixed Anxiety and Depressed Mood.   Diagnosis:  Axis I:  Adjustment disorder with mixed anxiety and depressed mood          Axis II: Deferred

## 2013-02-18 ENCOUNTER — Ambulatory Visit (INDEPENDENT_AMBULATORY_CARE_PROVIDER_SITE_OTHER): Payer: Federal, State, Local not specified - PPO | Admitting: Psychiatry

## 2013-02-18 DIAGNOSIS — F4323 Adjustment disorder with mixed anxiety and depressed mood: Secondary | ICD-10-CM

## 2013-02-19 NOTE — Progress Notes (Signed)
Patient:  Lori [name]   DOB: [birth year]  MR Number: [id number]  [facility]:  [address] [place],  [place], [address]  Start: Monday 02/18/2013 1:00 PM End: Monday 02/18/2013 1:45 PM  Provider/Observer:     Florencia Reasons, MSW, LCSW   Chief Complaint:      Chief Complaint  Patient presents with  . Anxiety  . Depression    Reason For Service:     The patient was referred for services by PA Sherie Don due to patient experiencing anxiety and depression. Per patient's report, her father died in 05-28-2013and her husband died suddenly 3 months later. Patient reports attending the [facility] and coping with grief and loss issues. However, in the course of managing financial issues since her husband's death, patient has discovered information that indicate her husband may have had a child by another woman. 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 She has discovered significant unexplained expenses as well as seen a picture of a girl on her husband's Gmail and Skype accounts. The name on the picture has the same last name as the patient's husband's last name. Patient is distraught and fears that her 23 year marriage may have been a lie. She reports that her husband had an affair many years ago but says nothing was mentioned about the possibility of her husband having a child outside of their marriage. Patient has hired a Photographer. She reports additional stress and instability regarding finances as preliminary information seems to suggest that husband may have changed the will per patient's report. She also reports stress related to an estranged relationship with her 64 year old son from whom she has had no contact since her husband's funeral. Patient reports additional stress related to recently being stalked by a female member of the [facility]. She reports he has been calling her and riding through her  neighborhood. He attended the church she attends and told ushers he was her visitor. Patient is seen for a followup appointment today.   Interventions Strategy:  Supportive therapy, cognitive behavioral therapy  Participation Level:   Active  Participation Quality:  Sharing      Behavioral Observation:  Casual, Alert, and talkative, tearful  Current Psychosocial Factors: Patient reports recent visit with her mother and seen in her siblings who complained to patient about her decreased involvement in caretaker responsibilities for their mother.  Content of Session:   Reviewing symptoms, processing feelings, identifying ways to set and maintain boundaries, encouraging patient's efforts to resume and maintain self-care efforts  Current Status:    Patient reports continued improved mood, decreased anxiety, and increased involvement in activity  Patient Progress:   Good. Patient reports managing the Easter holiday well. She attended church and gave a testimony regarding her experiences for the past several months. She reports acknowledging her sadness and her husband but also being able to experience joy and celebrate the holiday. She also reports increased involvement with her grandchildren and transporting them to various functions. She reports a recent visit with her mother and seeing her siblings who complained regarding patient's decreased involvement in caretaker responsibilities for their mother. Therapist and patient discussed boundary issues in the relationship. 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 Therapist and patient discuss explore patient's thought patterns and effects on her behavior. Therapist works with patient to identify ways to  pause and statements to use before making decisions and commitments to assist patient improve her ability to set and maintain boundaries.   Target Goals:  1. Process  and resolve grief and loss issues: 1:1 psychotherapy one time every 1-4 weeks (supportive, cognitive behavioral therapy)   2. Decrease anxiety and excessive worrying:  1:1 psychotherapy one time every 1-4 weeks (supportive, cognitive behavioral therapy)   3. Resume normal interest in activities, decrease crying spells: 1:1 psychotherapy one time every 1-4 weeks (supportive, cognitive behavioral therapy)   4. Improve assertiveness skills and the ability to set and maintain boundaries: 1:1 psychotherapy one time every 1-4 weeks (supportive, cognitive behavioral therapy)  Last Reviewed: 12/13/2012    Goals Addressed Today:   Goals  1,2, and 4  Impression/Diagnosis:   The patient presents with symptoms of anxiety and depression that developed as she was coping with the deaths of her father and husband and discovered information that indicate her husband may have had a child by another woman. Her current symptoms include depressed mood, crying spells, loss of appetite, sleep difficulty, memory difficulty, loss of interest in activities, excessive worrying, racing thoughts, and poor concentration. Diagnosis: Adjustment Disorder with Mixed Anxiety and Depressed Mood.   Diagnosis:  Axis I:  Adjustment disorder with mixed anxiety and depressed mood          Axis II: Deferred

## 2013-02-19 NOTE — Patient Instructions (Signed)
Discussed orally 

## 2013-02-28 ENCOUNTER — Ambulatory Visit (INDEPENDENT_AMBULATORY_CARE_PROVIDER_SITE_OTHER): Payer: Federal, State, Local not specified - PPO | Admitting: Nurse Practitioner

## 2013-02-28 ENCOUNTER — Encounter: Payer: Self-pay | Admitting: Nurse Practitioner

## 2013-02-28 VITALS — BP 110/70 | HR 80 | Ht 63.5 in | Wt 151.1 lb

## 2013-02-28 DIAGNOSIS — F329 Major depressive disorder, single episode, unspecified: Secondary | ICD-10-CM

## 2013-02-28 DIAGNOSIS — F32A Depression, unspecified: Secondary | ICD-10-CM

## 2013-02-28 DIAGNOSIS — G4700 Insomnia, unspecified: Secondary | ICD-10-CM

## 2013-02-28 MED ORDER — LORAZEPAM 0.5 MG PO TABS: 0.5000 mg | ORAL_TABLET | Freq: Every evening | ORAL | Status: DC | PRN

## 2013-02-28 NOTE — Patient Instructions (Addendum)
Take one pill (20 mg) per day for 2 weeks then 1/2 pill per day for 2 weeks then stop

## 2013-03-01 NOTE — Progress Notes (Signed)
Subjective:  Presents for followup. Ativan helping her sleep. Getting about 6-7 hours of sleep per night. Feels her depression is improving. Continues followup with therapist. Would like to wean off her Celexa at this point.  Objective:   BP 110/70  Pulse 80  Ht 5' 3.5" (1.613 m)  Wt 151 lb 2 oz (68.55 kg)  BMI 26.35 kg/m2 NAD. Alert, oriented. Calm affect. Lungs clear. Heart regular rate rhythm.  Assessment:Insomnia  Depression  Plan: Wean off Celexa as follows:Take one pill (20 mg) per day for 2 weeks then 1/2 pill per day for 2 weeks then stop. Advised patient that she may have side effects coming off Celexa even with weaning. Call back if any problems. Continue Ativan as directed for sleep. Recheck here as needed.  Reminded about preventive health physical.

## 2013-03-01 NOTE — Assessment & Plan Note (Signed)
Plan: Wean off Celexa as follows:Take one pill (20 mg) per day for 2 weeks then 1/2 pill per day for 2 weeks then stop. Advised patient that she may have side effects coming off Celexa even with weaning. Call back if any problems. Continue Ativan as directed for sleep. Recheck here as needed.   

## 2013-03-01 NOTE — Assessment & Plan Note (Signed)
Plan: Wean off Celexa as follows:Take one pill (20 mg) per day for 2 weeks then 1/2 pill per day for 2 weeks then stop. Advised patient that she may have side effects coming off Celexa even with weaning. Call back if any problems. Continue Ativan as directed for sleep. Recheck here as needed.

## 2013-03-06 ENCOUNTER — Telehealth: Payer: Self-pay | Admitting: Family Medicine

## 2013-03-06 ENCOUNTER — Ambulatory Visit (INDEPENDENT_AMBULATORY_CARE_PROVIDER_SITE_OTHER): Payer: Federal, State, Local not specified - PPO | Admitting: Psychiatry

## 2013-03-06 DIAGNOSIS — F4323 Adjustment disorder with mixed anxiety and depressed mood: Secondary | ICD-10-CM

## 2013-03-06 NOTE — Telephone Encounter (Signed)
Sorry, this requires face to face visit to change m h meds

## 2013-03-06 NOTE — Telephone Encounter (Signed)
Pt states that her Counselor said she needs to speak with you about lowering her meds... Lori [name], her counselor feels [name] should NOT lower her Celexa med... Tried to offer her an appt but she just wants to talk to you via phone.

## 2013-03-06 NOTE — Telephone Encounter (Signed)
Left message to return call 

## 2013-03-07 NOTE — Telephone Encounter (Signed)
Patient stated that she had cut her med in half by herself and the counselor rec she go back to regular dose.  Patient stated she went back to reg dose last night.

## 2013-03-08 NOTE — Patient Instructions (Signed)
Discussed orally 

## 2013-03-08 NOTE — Progress Notes (Signed)
Patient:  Lori [name]   DOB: 1951-07-22  MR Number: [id number]  Location: [facility]:  [address] [place] [place],  [place], [address]  Start: Wednesday 03/06/2013 1:05 PM End: Wednesday 03/06/2013 1:50 PM  Provider/Observer:     Florencia Reasons, MSW, LCSW   Chief Complaint:      Chief Complaint  Patient presents with  . Anxiety  . Depression    Reason For Service:     The patient was referred for services by PA Sherie Don due to patient experiencing anxiety and depression. Per patient's report, her father died in 05/29/2013and her husband died suddenly 3 months later. Patient reports attending the [facility] and coping with grief and loss issues. However, in the course of managing financial issues since her husband's death, patient has discovered information that indicate her husband may have had a child by another woman. 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 She has discovered significant unexplained expenses as well as seen a picture of a girl on her husband's Gmail and Skype accounts. The name on the picture has the same last name as the patient's husband's last name. Patient is distraught and fears that her 60 year marriage may have been a lie. She reports that her husband had an affair many years ago but says nothing was mentioned about the possibility of her husband having a child outside of their marriage. Patient has hired a Photographer. She reports additional stress and instability regarding finances as preliminary information seems to suggest that husband may have changed the will per patient's report. She also reports stress related to an estranged relationship with her 60 year old son from whom she has had no contact since her husband's funeral. Patient reports additional stress related to recently being stalked by a female member of the [facility]. She reports he has been calling her and riding through  her neighborhood. He attended the church she attends and told ushers he was her visitor. Patient is seen for a followup appointment today.   Interventions Strategy:  Supportive therapy, cognitive behavioral therapy  Participation Level:   Active  Participation Quality:  Sharing      Behavioral Observation:  Casual, Alert, and talkative, tearful  Current Psychosocial Factors: Patient reports son sent her an unsigned Mother's Day card that triggered increased sadness  Content of Session:   Reviewing symptoms, processing feelings, identifying patient's thoughts about medication and ways to reframe, encouraging patient to contact PCP for medication management and to resume self-care efforts  Current Status:    Patient reports depressed mood, increased anxiety, and increased crying spells  Patient Progress:   Fair. Patient reports recently deciding that she no longer needed medication as she was doing very well and feeling good. She discussed this with her PCP who advised patient not to stop taking the medication but did agree to reduce the dosage. However, patient has experienced increased emotionality and crying spells since reducing her antidepressant. She also reports beginning to lose interest in activities and closing her curtains. Patient shares with therapist that she did not want to be on medication due to to memories of her mother and siblings revolving  their lives around medication. Therapist works with patient to process her feelings and to reframe negative thought patterns regarding medication. Patient agrees to contact primary care physician today regarding medication. Patient  reports receiving an unsigned Mother's Day card from her son and becoming sad after discovering he  sent a very thoughtful Mother's Day card to his grandmother. Therapist works with patient to process her feelings and to encourage patient to resume self-care efforts.    Target Goals:  1. Process and resolve grief  and loss issues: 1:1 psychotherapy one time every 1-4 weeks (supportive, cognitive behavioral therapy)   2. Decrease anxiety and excessive worrying:  1:1 psychotherapy one time every 1-4 weeks (supportive, cognitive behavioral therapy)   3. Resume normal interest in activities, decrease crying spells: 1:1 psychotherapy one time every 1-4 weeks (supportive, cognitive behavioral therapy)   4. Improve assertiveness skills and the ability to set and maintain boundaries: 1:1 psychotherapy one time every 1-4 weeks (supportive, cognitive behavioral therapy)  Last Reviewed: 12/13/2012    Goals Addressed Today:   Goals  1,2, and 3  Impression/Diagnosis:   The patient presents with symptoms of anxiety and depression that developed as she was coping with the deaths of her father and husband and discovered information that indicate her husband may have had a child by another woman. Her current symptoms include depressed mood, crying spells, loss of appetite, sleep difficulty, memory difficulty, loss of interest in activities, excessive worrying, racing thoughts, and poor concentration. Diagnosis: Adjustment Disorder with Mixed Anxiety and Depressed Mood.   Diagnosis:  Axis I:  Adjustment disorder with mixed anxiety and depressed mood          Axis II: Deferred

## 2013-03-11 ENCOUNTER — Ambulatory Visit (INDEPENDENT_AMBULATORY_CARE_PROVIDER_SITE_OTHER): Payer: Federal, State, Local not specified - PPO | Admitting: Psychiatry

## 2013-03-11 DIAGNOSIS — F4323 Adjustment disorder with mixed anxiety and depressed mood: Secondary | ICD-10-CM

## 2013-03-11 NOTE — Progress Notes (Signed)
Patient:  Lori [name]   DOB: 06/18/1951  MR Number: [id number]  Location: [facility]:  [address] [place],  [place], [address]  Start: Monday 03/11/2013 9:00 AM End: Monday 03/11/2013 9:50 AM  Provider/Observer:     Florencia Reasons, MSW, LCSW   Chief Complaint:      Chief Complaint  Patient presents with  . Depression  . Anxiety    Reason For Service:     The patient was referred for services by PA Sherie Don due to patient experiencing anxiety and depression. Per patient's report, her father died in 05-19-13and her husband died suddenly 3 months later. Patient reports attending the [facility] and coping with grief and loss issues. However, in the course of managing financial issues since her husband's death, patient has discovered information that indicate her husband may have had a child by another woman. 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 She has discovered significant unexplained expenses as well as seen a picture of a girl on her husband's Gmail and Skype accounts. The name on the picture has the same last name as the patient's husband's last name. Patient is distraught and fears that her 72 year marriage may have been a lie. She reports that her husband had an affair many years ago but says nothing was mentioned about the possibility of her husband having a child outside of their marriage. Patient has hired a Photographer. She reports additional stress and instability regarding finances as preliminary information seems to suggest that husband may have changed the will per patient's report. She also reports stress related to an estranged relationship with her 1 year old son from whom she has had no contact since her husband's funeral. Patient reports additional stress related to recently being stalked by a female member of the [facility]. She reports he has been calling her and riding through her  neighborhood. He attended the church she attends and told ushers he was her visitor. Patient is seen for a followup appointment today.   Interventions Strategy:  Supportive therapy, cognitive behavioral therapy  Participation Level:   Active  Participation Quality:  Sharing      Behavioral Observation:  Casual, Alert, and talkative, tearful  Current Psychosocial Factors: First anniversary of patient's father's death is this week.  Content of Session:   Reviewing symptoms, processing grief and loss issues, identifying memorializations  Current Status:    Patient reports improved mood, increased involvement in activity, and decreased crying spells. She experiences appropriate sadness regarding her deceased father and deceased husband.  Patient Progress:   Fair. Patient reports resuming increased dosage of medication and inform her primary care physician. She has experienced some improvement and reports decreased crying spells. She is beginning to resume normal interest in activities including being with her grandson and starting to socialize with friends. She also has been walking. Patient reports increased memories of husband's death triggered by going by the hospital where he died for the first time since his death. Patient shares narrative. Therapist works with patient to process feelings and to identify strengths as well as coping skills during that time. Patient also shares narrative of her father's death. Therapist works with patient to process feelings and to identify mineralizations and ways to cope on the anniversary of his death.   Target Goals:  1. Process and resolve grief and loss issues: 1:1 psychotherapy one time every 1-4 weeks (supportive, cognitive behavioral therapy)  2. Decrease anxiety and excessive worrying:  1:1 psychotherapy one time every 1-4 weeks (supportive, cognitive behavioral therapy)   3. Resume normal interest in activities, decrease crying spells: 1:1  psychotherapy one time every 1-4 weeks (supportive, cognitive behavioral therapy)   4. Improve assertiveness skills and the ability to set and maintain boundaries: 1:1 psychotherapy one time every 1-4 weeks (supportive, cognitive behavioral therapy)  Last Reviewed: 12/13/2012    Goals Addressed Today:   Goals  1,2, and 3  Impression/Diagnosis:   The patient presents with symptoms of anxiety and depression that developed as she was coping with the deaths of her father and husband and discovered information that indicate her husband may have had a child by another woman. Her current symptoms include depressed mood, crying spells, loss of appetite, sleep difficulty, memory difficulty, loss of interest in activities, excessive worrying, racing thoughts, and poor concentration. Diagnosis: Adjustment Disorder with Mixed Anxiety and Depressed Mood.   Diagnosis:  Axis I:  Adjustment disorder with mixed anxiety and depressed mood          Axis II: Deferred

## 2013-03-11 NOTE — Patient Instructions (Signed)
Discussed orally 

## 2013-03-20 ENCOUNTER — Ambulatory Visit (INDEPENDENT_AMBULATORY_CARE_PROVIDER_SITE_OTHER): Payer: Federal, State, Local not specified - PPO | Admitting: Psychiatry

## 2013-03-20 ENCOUNTER — Telehealth: Payer: Self-pay | Admitting: Nurse Practitioner

## 2013-03-20 DIAGNOSIS — F4323 Adjustment disorder with mixed anxiety and depressed mood: Secondary | ICD-10-CM

## 2013-03-20 MED ORDER — LORAZEPAM 0.5 MG PO TABS: 0.5000 mg | ORAL_TABLET | Freq: Every evening | ORAL | Status: DC | PRN

## 2013-03-20 MED ORDER — CITALOPRAM HYDROBROMIDE 20 MG PO TABS: ORAL_TABLET | ORAL | Status: DC

## 2013-03-20 NOTE — Progress Notes (Signed)
Patient:  Lori [name]   DOB: [birth year]  MR Number: [id number]  Location: [facility]:  [address] [place],  [place], [address]  Start: Monday 03/11/2013 9:00 AM End: Monday 03/11/2013 9:50 AM  Provider/Observer:     Florencia Reasons, MSW, LCSW   Chief Complaint:      Chief Complaint  Patient presents with  . Depression  . Anxiety    Reason For Service:     The patient was referred for services by PA Sherie Don due to patient experiencing anxiety and depression. Per patient's report, her father died in May 25, 2013and her husband died suddenly 3 months later. Patient reports attending the [facility] and coping with grief and loss issues. However, in the course of managing financial issues since her husband's death, patient has discovered information that indicate her husband may have had a child by another woman. 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 She has discovered significant unexplained expenses as well as seen a picture of a girl on her husband's Gmail and Skype accounts. The name on the picture has the same last name as the patient's husband's last name. Patient is distraught and fears that her 10 year marriage may have been a lie. She reports that her husband had an affair many years ago but says nothing was mentioned about the possibility of her husband having a child outside of their marriage. Patient has hired a Photographer. She reports additional stress and instability regarding finances as preliminary information seems to suggest that husband may have changed the will per patient's report. She also reports stress related to an estranged relationship with her 64 year old son from whom she has had no contact since her husband's funeral. Patient reports additional stress related to recently being stalked by a female member of the [facility]. She reports he has been calling her and riding through her  neighborhood. He attended the church she attends and told ushers he was her visitor. Patient is seen for a followup appointment today.   Interventions Strategy:  Supportive therapy, cognitive behavioral therapy  Participation Level:   Active  Participation Quality:  Sharing      Behavioral Observation:  Casual, Alert, and talkative, tearful  Current Psychosocial Factors:   Content of Session:   Processing feelings, reinforcing patient's self-care efforts as well as efforts to increase social involvement., reviewing coping techniques, practicing a mindfulness exercise using breath awareness  Current Status:    Patient reports continued  improved mood, increased involvement in activity, and absence of crying spells.   Patient Progress:   Good. She reports spending time with her mother and siblings along with her daughter discussing fine memories of her father on the anniversary of his death. She reports this was helpful. She talks more about her husband today ,shares funny memories and is able to laugh in session. She reports continued thoughts about unanswered questions about her husband's life but no longer dwelling on this or feeling overwhelmed. She expresses greater acceptance and increased desire to reinvest in life. She has increased her involvement in activity including attending church, walking, gardening, and socializing with family and friends. She is looking forward to going on a 4 day trip to [place] tomorrow with family. She also is planning two more trips in June with family and friends. Therapist works with patient to review coping and relaxation techniques.    Target Goals:  1. Process and resolve grief and loss  issues: 1:1 psychotherapy one time every 1-4 weeks (supportive, cognitive behavioral therapy)   2. Decrease anxiety and excessive worrying:  1:1 psychotherapy one time every 1-4 weeks (supportive, cognitive behavioral therapy)   3. Resume normal interest in activities,  decrease crying spells: 1:1 psychotherapy one time every 1-4 weeks (supportive, cognitive behavioral therapy)   4. Improve assertiveness skills and the ability to set and maintain boundaries: 1:1 psychotherapy one time every 1-4 weeks (supportive, cognitive behavioral therapy)  Last Reviewed: 12/13/2012    Goals Addressed Today:   Goals  1,2, and 3  Impression/Diagnosis:   The patient presents with symptoms of anxiety and depression that developed as she was coping with the deaths of her father and husband and discovered information that indicate her husband may have had a child by another woman. Her current symptoms include depressed mood, crying spells, loss of appetite, sleep difficulty, memory difficulty, loss of interest in activities, excessive worrying, racing thoughts, and poor concentration. Diagnosis: Adjustment Disorder with Mixed Anxiety and Depressed Mood.   Diagnosis:  Axis I:  Adjustment disorder with mixed anxiety and depressed mood          Axis II: Deferred

## 2013-03-20 NOTE — Telephone Encounter (Signed)
Pt has 10 days left on her ([facility])  LORazepam (ATIVAN) 0.5 MG tablet citalopram (CELEXA) 20 MG tablet   Can she get a refill on these two when she runs out in the next ten days or does she need an appt with Eber Jones first?   Please advise

## 2013-04-15 ENCOUNTER — Ambulatory Visit (INDEPENDENT_AMBULATORY_CARE_PROVIDER_SITE_OTHER): Payer: Federal, State, Local not specified - PPO | Admitting: Psychiatry

## 2013-04-15 DIAGNOSIS — F4323 Adjustment disorder with mixed anxiety and depressed mood: Secondary | ICD-10-CM

## 2013-04-15 NOTE — Patient Instructions (Signed)
Discussed orally 

## 2013-04-15 NOTE — Progress Notes (Addendum)
Patient:  Lori [name]   DOB: [birth year]  MR Number: [id number]  [facility]: [facility]:  [address] [place],  [place], [address]  Start: Monday 04/15/2013 1:10 PM End: Monday 04/15/2013 2:00 PM  Provider/Observer:     Florencia Reasons, MSW, LCSW   Chief Complaint:      Chief Complaint  Patient presents with  . Anxiety  . Depression    Reason For Service:     The patient was referred for services by PA Sherie Don due to patient experiencing anxiety and depression. Per patient's report, her father died in 05/28/13and her husband died suddenly 3 months later. Patient reports attending the [facility] and coping with grief and loss issues. However, in the course of managing financial issues since her husband's death, patient has discovered information that indicate her husband may have had a child by another woman. 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 She has discovered significant unexplained expenses as well as seen a picture of a girl on her husband's Gmail and Skype accounts. The name on the picture has the same last name as the patient's husband's last name. Patient is distraught and fears that her 32 year marriage may have been a lie. She reports that her husband had an affair many years ago but says nothing was mentioned about the possibility of her husband having a child outside of their marriage. Patient has hired a Photographer. She reports additional stress and instability regarding finances as preliminary information seems to suggest that husband may have changed the will per patient's report. She also reports stress related to an estranged relationship with her 44 year old son from whom she has had no contact since her husband's funeral. Patient reports additional stress related to recently being stalked by a female member of the [facility]. She reports he has been calling her and riding through her  neighborhood. He attended the church she attends and told ushers he was her visitor. Patient is seen for a followup appointment today.   Interventions Strategy:  Supportive therapy, cognitive behavioral therapy  Participation Level:   Active  Participation Quality:  Sharing      Behavioral Observation:  Casual, Alert, and talkative,   Current Psychosocial Factors:   Content of Session:   Processing grief and loss issues, reviewing treatment plan, reinforcing patient's efforts to improve assertiveness skills  Current Status:    Patient reports continued  improved mood, increased involvement in activity, and absence of crying spells.   Patient Progress:   Good. She reports going on 3 trips with family and friends since last session. She experienced moments of sadness on her birthday and when she went on her first trip. However, she did not become overwhelmed per patient's report. She also reports increased motivation and interest in previously enjoyed activities such as working in her yard and shopping. Patient also shares instances  of successfully using  assertiveness skills. Patient is very cheerful and smiles frequently during session. Patient remains compliant with medication and is scheduled to see primary care physician for med management next week.    Target Goals:  1. Process and resolve grief and loss issues: 1:1 psychotherapy one time every 1-4 weeks (supportive, cognitive behavioral therapy)   2. Decrease anxiety and excessive worrying:  1:1 psychotherapy one time every 1-4 weeks (supportive, cognitive behavioral therapy)   3. Resume normal interest in activities, decrease crying spells: 1:1 psychotherapy one time every 1-4  weeks (supportive, cognitive behavioral therapy)   4. Improve assertiveness skills and the ability to set and maintain boundaries: 1:1 psychotherapy one time every 1-4 weeks (supportive, cognitive behavioral therapy)  Last Reviewed: 04/15/2013    Goals  Addressed Today:   Goals 1 and 4  Impression/Diagnosis:   The patient presents with symptoms of anxiety and depression that developed as she was coping with the deaths of her father and husband and discovered information that indicate her husband may have had a child by another woman. Her current symptoms include depressed mood, crying spells, loss of appetite, sleep difficulty, memory difficulty, loss of interest in activities, excessive worrying, racing thoughts, and poor concentration. Diagnosis: Adjustment Disorder with Mixed Anxiety and Depressed Mood.   Diagnosis:  Axis I:  Adjustment disorder with mixed anxiety and depressed mood          Axis II: Deferred

## 2013-04-25 ENCOUNTER — Ambulatory Visit (INDEPENDENT_AMBULATORY_CARE_PROVIDER_SITE_OTHER): Payer: Federal, State, Local not specified - PPO | Admitting: Nurse Practitioner

## 2013-04-25 ENCOUNTER — Encounter: Payer: Self-pay | Admitting: Nurse Practitioner

## 2013-04-25 VITALS — BP 102/72 | HR 80 | Wt 158.4 lb

## 2013-04-25 DIAGNOSIS — G5690 Unspecified mononeuropathy of unspecified upper limb: Secondary | ICD-10-CM

## 2013-04-25 DIAGNOSIS — F32A Depression, unspecified: Secondary | ICD-10-CM

## 2013-04-25 DIAGNOSIS — F329 Major depressive disorder, single episode, unspecified: Secondary | ICD-10-CM

## 2013-04-25 DIAGNOSIS — L568 Other specified acute skin changes due to ultraviolet radiation: Secondary | ICD-10-CM

## 2013-04-25 DIAGNOSIS — G5692 Unspecified mononeuropathy of left upper limb: Secondary | ICD-10-CM

## 2013-04-25 DIAGNOSIS — G4700 Insomnia, unspecified: Secondary | ICD-10-CM

## 2013-04-25 MED ORDER — TRIAMCINOLONE ACETONIDE 0.1 % EX CREA: TOPICAL_CREAM | Freq: Two times a day (BID) | CUTANEOUS | Status: DC

## 2013-04-25 MED ORDER — NAPROXEN 500 MG PO TABS: 500.0000 mg | ORAL_TABLET | Freq: Two times a day (BID) | ORAL | Status: DC

## 2013-04-28 ENCOUNTER — Encounter: Payer: Self-pay | Admitting: Nurse Practitioner

## 2013-04-28 DIAGNOSIS — G5690 Unspecified mononeuropathy of unspecified upper limb: Secondary | ICD-10-CM | POA: Insufficient documentation

## 2013-04-28 NOTE — Assessment & Plan Note (Signed)
Continue Celexa and Ativan as directed. Continue mental health counseling.

## 2013-04-28 NOTE — Assessment & Plan Note (Signed)
Continue Celexa and Ativan as directed. Continue mental health counseling. 

## 2013-04-28 NOTE — Assessment & Plan Note (Signed)
Taking naproxen as directed. Wear wrist brace as much as possible. Will refer to hand specialist for further evaluation.

## 2013-04-28 NOTE — Progress Notes (Signed)
Subjective:  Presents for recheck. Sleeping well at nighttime. Taking Ativan when needed. Continues to receive mental health counseling. Symptoms are stable on Celexa. Denies suicidal or homicidal thoughts or ideation. Has begun a walking program each morning. Has gone several trips with family. Feeling some better. In addition has had left hand numbness and tingling for the past one to 2 months. Is now constant. It involves the entire hand and fingers. Also pain in the hand particularly at nighttime. No neck pain. No left shoulder pain. No elbow pain. No history of injury but has had problems with this before while working at the [employer]. Wears a wrist brace with minimal relief. Extreme pain at nighttime. Also complaints of a pruritic rash only on her arms that worsened each time she was out in the sun, symptoms are resolving. No other rash. No fever. No headache. No known allergens.  Objective:   BP 102/72  Pulse 80  Wt 158 lb 6 oz (71.838 kg)  BMI 27.61 kg/m2 NAD  alert, oriented. Cheerful affect. Fading scattered papular rash noted on the arms, resolving. Lungs clear. Heart regular rate rhythm. Good ROM of the neck without tenderness. Good ROM of the left shoulder and elbow without tenderness. Her symptoms start just proximal to the left wrist area. No masses noted. Good ROM of the left wrist with complaints of tingling and pain following this. Muscle strength 5+ right hand 4+ left hand. Strong radial pulse. Fingers warm with normal capillary refill.  Assessment:Photodermatitis due to sun  Neuropathy of hand, left - Plan: Ambulatory referral to Orthopedic Surgery  Depression  Insomnia  Plan: Meds ordered this encounter  Medications  . triamcinolone cream (KENALOG) 0.1 %    Sig: Apply topically 2 (two) times daily. Prn rash up to 2 weeks at a time    Dispense:  45 g    Refill:  0    Order Specific Question:  Supervising Provider    Answer:  Merlyn Albert [[id number]]  . naproxen  (NAPROSYN) 500 MG tablet    Sig: Take 1 tablet (500 mg total) by mouth 2 (two) times daily with a meal. Prn pain    Dispense:  30 tablet    Refill:  2    Order Specific Question:  Supervising Provider    Answer:  Merlyn Albert [[id number]]   Continue Celexa and Ativan as directed. Recheck here in 4 months, call back sooner if any problems. Wear brace as much as possible if it helps her symptoms.

## 2013-05-13 ENCOUNTER — Ambulatory Visit (INDEPENDENT_AMBULATORY_CARE_PROVIDER_SITE_OTHER): Payer: Federal, State, Local not specified - PPO | Admitting: Psychiatry

## 2013-05-13 DIAGNOSIS — F4323 Adjustment disorder with mixed anxiety and depressed mood: Secondary | ICD-10-CM

## 2013-05-13 NOTE — Patient Instructions (Signed)
Discussed orally 

## 2013-05-13 NOTE — Progress Notes (Signed)
Patient:  Lori [name]   DOB: 01-Jul-1951  MR Number: [id number]  Location: [facility]:  [address] [place],  [place], [address]  Start: Monday 05/13/2013 1:05 PM End: Monday 05/13/2013 1:55 PM  Provider/Observer:     Florencia Reasons, MSW, LCSW   Chief Complaint:      Chief Complaint  Patient presents with  . Depression  . Anxiety    Reason For Service:     The patient was referred for services by PA Sherie Don due to patient experiencing anxiety and depression. Per patient's report, her father died in 05-31-13and her husband died suddenly 3 months later. Patient reports attending the [facility] and coping with grief and loss issues. However, in the course of managing financial issues since her husband's death, patient has discovered information that indicate her husband may have had a child by another woman. 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 She has discovered significant unexplained expenses as well as seen a picture of a girl on her husband's Gmail and Skype accounts. The name on the picture has the same last name as the patient's husband's last name. Patient is distraught and fears that her 15 year marriage may have been a lie. She reports that her husband had an affair many years ago but says nothing was mentioned about the possibility of her husband having a child outside of their marriage. Patient has hired a Photographer. She reports additional stress and instability regarding finances as preliminary information seems to suggest that husband may have changed the will per patient's report. She also reports stress related to an estranged relationship with her 73 year old son from whom she has had no contact since her husband's funeral. Patient reports additional stress related to recently being stalked by a female member of the [facility]. She reports he has been calling her and riding through her  neighborhood. He attended the church she attends and told ushers he was her visitor. Patient is seen for a followup appointment today.   Interventions Strategy:  Supportive therapy, cognitive behavioral therapy  Participation Level:   Active  Participation Quality:  Sharing      Behavioral Observation:  Casual, Alert, and talkative,  tearful at times  Current Psychosocial Factors:   Content of Session:   Processing grief and loss issues, reinforcing patient's efforts to improve assertiveness skills  Current Status:    Patient reports continued  improved mood and increased involvement in activity. She reports a few crying spells but not becoming overwhelmed   Patient Progress:   Good. Patient has maintained involvement in activity including socializing with family and friends and attending church. She also has enjoyed keeping her grandson. She expresses sadness as today is her son's birthday. Patient still has had no contact from her son since her husband's funeral. Therapist works with patient to process feelings. Patient is experiencing anxiety about attending an upcoming event to honor her aunt. She states difficulty being around some of her relatives as they have not been supportive and have a history of treating patient negatively. Therapist works with patient to process her feelings, to identify ways to set and maintain boundaries, and to identify coping statements. Patient's daughter will attend the event with patient. Therapist works with patient to use her support system. Patient reports planning to enroll in a computer class in August.      Target Goals:  1. Process and resolve grief and loss  issues: 1:1 psychotherapy one time every 1-4 weeks (supportive, cognitive behavioral therapy)   2. Decrease anxiety and excessive worrying:  1:1 psychotherapy one time every 1-4 weeks (supportive, cognitive behavioral therapy)   3. Resume normal interest in activities, decrease crying spells: 1:1  psychotherapy one time every 1-4 weeks (supportive, cognitive behavioral therapy)   4. Improve assertiveness skills and the ability to set and maintain boundaries: 1:1 psychotherapy one time every 1-4 weeks (supportive, cognitive behavioral therapy)  Last Reviewed: 04/15/2013    Goals Addressed Today:   Goals 1,2,3 and 4  Impression/Diagnosis:   The patient presents with symptoms of anxiety and depression that developed as she was coping with the deaths of her father and husband and discovered information that indicate her husband may have had a child by another woman. Her current symptoms include depressed mood, crying spells, loss of appetite, sleep difficulty, memory difficulty, loss of interest in activities, excessive worrying, racing thoughts, and poor concentration. Diagnosis: Adjustment Disorder with Mixed Anxiety and Depressed Mood.   Diagnosis:  Axis I:  Adjustment disorder with mixed anxiety and depressed mood          Axis II: Deferred

## 2013-05-28 ENCOUNTER — Ambulatory Visit (HOSPITAL_COMMUNITY): Payer: Self-pay | Admitting: Psychiatry

## 2013-06-04 ENCOUNTER — Ambulatory Visit (INDEPENDENT_AMBULATORY_CARE_PROVIDER_SITE_OTHER): Payer: Federal, State, Local not specified - PPO | Admitting: Psychiatry

## 2013-06-04 DIAGNOSIS — F4323 Adjustment disorder with mixed anxiety and depressed mood: Secondary | ICD-10-CM

## 2013-06-06 NOTE — Patient Instructions (Signed)
Discussed orally 

## 2013-06-06 NOTE — Progress Notes (Signed)
Patient:  Lori [name]   DOB: [birth year]  MR Number: [id number]  Location: [facility]:  [address] [place],  [place], [address]  Start: Tuesday 06/04/2013 4:00 PM End: Tuesday 06/04/2013 4:50 PM  Provider/Observer:     Florencia Reasons, MSW, LCSW   Chief Complaint:      Chief Complaint  Patient presents with  . Anxiety  . Depression    Reason For Service:     The patient was referred for services by PA Sherie Don due to patient experiencing anxiety and depression. Per patient's report, her father died in 2013/05/11and her husband died suddenly 3 months later. Patient reports attending the [facility] and coping with grief and loss issues. However, in the course of managing financial issues since her husband's death, patient has discovered information that indicate her husband may have had a child by another woman. 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 She has discovered significant unexplained expenses as well as seen a picture of a girl on her husband's Gmail and Skype accounts. The name on the picture has the same last name as the patient's husband's last name. Patient is distraught and fears that her 74 year marriage may have been a lie. She reports that her husband had an affair many years ago but says nothing was mentioned about the possibility of her husband having a child outside of their marriage. Patient has hired a Photographer. She reports additional stress and instability regarding finances as preliminary information seems to suggest that husband may have changed the will per patient's report. She also reports stress related to an estranged relationship with her 46 year old son from whom she has had no contact since her husband's funeral. Patient reports additional stress related to recently being stalked by a female member of the [facility]. She reports he has been calling her and riding through her  neighborhood. He attended the church she attends and told ushers he was her visitor. Patient is seen for a followup appointment today.   Interventions Strategy:  Supportive therapy, cognitive behavioral therapy  Participation Level:   Active  Participation Quality:  Sharing      Behavioral Observation:  Casual, Alert, and talkative,  tearful at times  Current Psychosocial Factors:   Content of Session:   Processing feelings, reinforcing patient's efforts to improve assertiveness skills  Current Status:    Patient reports continued  improved mood and involvement in activity. She reports a few crying spells but not becoming overwhelmed  she continues to experience poor concentration and memory difficulty.  Patient Progress:   Good. Patient has maintained involvement in activity including socializing with family and friends and attending a family function.  She also recently went on a weekend trip to [place]. Patient reports attending event for her on and managing this well using her coping skills and support from her daughter. Patient recently began a computer course which has been helpful.  However, patient expresses sadness as she continues to struggle in defining goals for self and adjusting to life as a widow. She also expresses sadness as she still has had no contact from his son but says he has contacted other family members. Therapist works with patient to process her feelings.       Target Goals:  1. Process and resolve grief and loss issues: 1:1 psychotherapy one time every 1-4 weeks (supportive, cognitive behavioral therapy)   2. Decrease anxiety and excessive worrying:  1:1 psychotherapy one time every 1-4 weeks (supportive, cognitive behavioral therapy)   3. Resume normal interest in activities, decrease crying spells: 1:1 psychotherapy one time every 1-4 weeks (supportive, cognitive behavioral therapy)   4. Improve assertiveness skills and the ability to set and maintain  boundaries: 1:1 psychotherapy one time every 1-4 weeks (supportive, cognitive behavioral therapy)  Last Reviewed: 04/15/2013    Goals Addressed Today:   Goals 1,2,3 and 4  Impression/Diagnosis:   The patient presents with symptoms of anxiety and depression that developed as she was coping with the deaths of her father and husband and discovered information that indicate her husband may have had a child by another woman. Her current symptoms include depressed mood, crying spells, loss of appetite, sleep difficulty, memory difficulty, loss of interest in activities, excessive worrying, racing thoughts, and poor concentration. Diagnosis: Adjustment Disorder with Mixed Anxiety and Depressed Mood.   Diagnosis:  Axis I:  Adjustment disorder with mixed anxiety and depressed mood          Axis II: Deferred

## 2013-06-18 ENCOUNTER — Ambulatory Visit (HOSPITAL_COMMUNITY): Payer: Self-pay | Admitting: Psychiatry

## 2013-06-19 ENCOUNTER — Ambulatory Visit (INDEPENDENT_AMBULATORY_CARE_PROVIDER_SITE_OTHER): Payer: Federal, State, Local not specified - PPO | Admitting: Psychiatry

## 2013-06-19 DIAGNOSIS — F4323 Adjustment disorder with mixed anxiety and depressed mood: Secondary | ICD-10-CM

## 2013-06-19 NOTE — Patient Instructions (Signed)
Discussed orally 

## 2013-06-19 NOTE — Progress Notes (Signed)
Patient:  Lori [name]   DOB: 10-16-1951  MR Number: [id number]  Location: [facility]:  [address] [place] [place],  [place], [address]  Start: Wednesday 06/19/2013 9:00 AM End: Wednesday 06/19/2013 9:50 AM  Provider/Observer:     Florencia Reasons, MSW, LCSW   Chief Complaint:      Chief Complaint  Patient presents with  . Anxiety  . Depression    Reason For Service:     The patient was referred for services by PA Sherie Don due to patient experiencing anxiety and depression. Per patient's report, her father died in May 22, 2013and her husband died suddenly 3 months later. Patient reports attending the [facility] and coping with grief and loss issues. However, in the course of managing financial issues since her husband's death, patient has discovered information that indicate her husband may have had a child by another woman. 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 She has discovered significant unexplained expenses as well as seen a picture of a girl on her husband's Gmail and Skype accounts. The name on the picture has the same last name as the patient's husband's last name. Patient is distraught and fears that her 60 year marriage may have been a lie. She reports that her husband had an affair many years ago but says nothing was mentioned about the possibility of her husband having a child outside of their marriage. Patient has hired a Photographer. She reports additional stress and instability regarding finances as preliminary information seems to suggest that husband may have changed the will per patient's report. She also reports stress related to an estranged relationship with her 60 year old son from whom she has had no contact since her husband's funeral. Patient reports additional stress related to recently being stalked by a female member of the [facility]. She reports he has been calling her and riding through  her neighborhood. He attended the church she attends and told ushers he was her visitor. Patient is seen for a followup appointment today.   Interventions Strategy:  Supportive therapy, cognitive behavioral therapy  Participation Level:   Active  Participation Quality:  Sharing      Behavioral Observation:  Casual, Alert, and talkative,  tearful at times  Current Psychosocial Factors: Patient reports grandson needs surgery. Patient still has had no contact from her son.  Content of Session:   Processing feelings, reinforcing patient's efforts to improve assertiveness skills, encouraging patient to continue involvement in activities  Current Status:    Patient reports continued  improved mood and involvement in activity. She reports a few crying spells but not becoming overwhelmed.  Patient Progress:   Good. Patient has maintained involvement in activity and is reinvesting in life resuming involvement in activities previously enjoyed with her husband. She recently attended her daughter's boxing match. She also is continuing to attend compare classes. Patient is initiating interaction with other students and is feeling more productive and purposeful. She expresses frustration, hurt, and disappointment with her mother-in-law who gave information to patient's son regarding his grandson but did not inform patient she had contact with patient's son. She reports being assertive with her mother-in-law and setting boundaries with her as well as other family members.      Target Goals:  1. Process and resolve grief and loss issues: 1:1 psychotherapy one time every 1-4 weeks (supportive, cognitive behavioral therapy)   2. Decrease anxiety and excessive worrying:  1:1 psychotherapy one time  every 1-4 weeks (supportive, cognitive behavioral therapy)   3. Resume normal interest in activities, decrease crying spells: 1:1 psychotherapy one time every 1-4 weeks (supportive, cognitive behavioral therapy)   4.  Improve assertiveness skills and the ability to set and maintain boundaries: 1:1 psychotherapy one time every 1-4 weeks (supportive, cognitive behavioral therapy)  Last Reviewed: 04/15/2013    Goals Addressed Today:   Goals 1,2,3 and 4  Impression/Diagnosis:   The patient presents with symptoms of anxiety and depression that developed as she was coping with the deaths of her father and husband and discovered information that indicate her husband may have had a child by another woman. Her current symptoms include depressed mood, crying spells, loss of appetite, sleep difficulty, memory difficulty, loss of interest in activities, excessive worrying, racing thoughts, and poor concentration. Diagnosis: Adjustment Disorder with Mixed Anxiety and Depressed Mood.   Diagnosis:  Axis I:  Adjustment disorder with mixed anxiety and depressed mood          Axis II: Deferred

## 2013-06-27 ENCOUNTER — Ambulatory Visit (INDEPENDENT_AMBULATORY_CARE_PROVIDER_SITE_OTHER): Payer: Federal, State, Local not specified - PPO | Admitting: Nurse Practitioner

## 2013-06-27 ENCOUNTER — Encounter: Payer: Self-pay | Admitting: Nurse Practitioner

## 2013-06-27 VITALS — BP 130/82 | Ht 64.5 in | Wt 161.0 lb

## 2013-06-27 DIAGNOSIS — F32A Depression, unspecified: Secondary | ICD-10-CM

## 2013-06-27 DIAGNOSIS — F329 Major depressive disorder, single episode, unspecified: Secondary | ICD-10-CM

## 2013-06-27 DIAGNOSIS — G4700 Insomnia, unspecified: Secondary | ICD-10-CM

## 2013-06-27 MED ORDER — LORAZEPAM 0.5 MG PO TABS: ORAL_TABLET | ORAL | Status: DC

## 2013-06-27 MED ORDER — CITALOPRAM HYDROBROMIDE 20 MG PO TABS: ORAL_TABLET | ORAL | Status: DC

## 2013-06-27 NOTE — Assessment & Plan Note (Addendum)
Continue current medications as directed. Given a few extra Ativan due to increased stress related to the anniversary of her husband's death. Continue mental health counseling.

## 2013-06-27 NOTE — Progress Notes (Signed)
Subjective:  Presents for routine followup. Compliant with medications. Continues to see her mental health counselor. One-year anniversary of her husband's death is coming up, patient is feeling more emotional. Does not feel the need to change her medications at this point. Overall sleeping well.  Objective:   BP 130/82  Ht 5' 4.5" (1.638 m)  Wt 161 lb (73.029 kg)  BMI 27.22 kg/m2 NAD. Alert, oriented. Tearing up at times during office visit. Lungs clear. Heart regular rate rhythm.  Assessment:Depression  Insomnia  Plan: Meds ordered this encounter  Medications  . citalopram (CELEXA) 20 MG tablet    Sig: Take 1 1/2 tablet po daily    Dispense:  45 tablet    Refill:  2    Order Specific Question:  Supervising Provider    Answer:  Merlyn Albert [[id number]]  . LORazepam (ATIVAN) 0.5 MG tablet    Sig: Take 1/2-1 tablet po TID prn    Dispense:  45 tablet    Refill:  2    [facility]     Order Specific Question:  Supervising Provider    Answer:  Merlyn Albert [[id number]]   Cautioned about drowsiness with Ativan. Given a few extra pills per month due to increased anxiety related to her situation. Recommend wellness checkup and routine lab work at her followup in 3 months. Call back sooner if needed.

## 2013-07-10 ENCOUNTER — Ambulatory Visit (HOSPITAL_COMMUNITY): Payer: Self-pay | Admitting: Psychiatry

## 2013-07-22 ENCOUNTER — Ambulatory Visit (INDEPENDENT_AMBULATORY_CARE_PROVIDER_SITE_OTHER): Payer: Federal, State, Local not specified - PPO | Admitting: Psychiatry

## 2013-07-22 DIAGNOSIS — F4323 Adjustment disorder with mixed anxiety and depressed mood: Secondary | ICD-10-CM

## 2013-07-26 NOTE — Patient Instructions (Signed)
Discussed orally 

## 2013-07-26 NOTE — Progress Notes (Signed)
Patient:  Lori [name]   DOB: Aug 21, 1951  MR Number: [id number]  [facility]:  [address] [place],  [place], [address]  Start: Monday 07/22/2013 1:00 PM End: Monday 07/22/2013 1:55 PM  Provider/Observer:     Florencia Reasons, MSW, LCSW   Chief Complaint:      Chief Complaint  Patient presents with  . Anxiety  . Depression    Reason For Service:     The patient was referred for services by PA Sherie Don due to patient experiencing anxiety and depression. Per patient's report, her father died in 06-07-13and her husband died suddenly 3 months later. I the course of managing financial issues since her husband's death, patient has discovered information that indicate her husband may have had a child by another woman. Patient is distraught and fears that her 10 year marriage may have been a lie. She also reports stress related to an estranged relationship with her 87 year old son from whom she has had no contact since her husband's funeral. Patient continues to experience unresolved grief and loss issues. She is seen for a followup appointment today.   Interventions Strategy:  Supportive therapy, cognitive behavioral therapy  Participation Level:   Active  Participation Quality:  Sharing      Behavioral Observation:  Casual, Alert, and talkative,  tearful at times  Current Psychosocial Factors: The first anniversary of patient's husband's death is this upcoming 08/26/23. Patient still has had no contact from her son.  Content of Session:   Processing grief and loss issues, identifying memorializations, identifying coping statements,encouraging patient to use support system  Current Status:    Patient reports increased sadness and tearfulness.  Patient Progress:   Patient reports increased sadness and tearfulness as the first anniversary of her husband's death approaches. 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 Therapist works with patient to identify realistic expectations and coping statements. Patient is concentrating more on the positive memories of her husband and plans to develop a photo album of memorable moments with husband. She also is trying to reframe negative thoughts about this time of year and the mammary to associated with her husband's death. Patient has maintained involvement in activity including attending church and plans to start a class tomorrow.    Target Goals:  1. Process and resolve grief and loss issues: 1:1 psychotherapy one time every 1-4 weeks (supportive, cognitive behavioral therapy)   2. Decrease anxiety and excessive worrying:  1:1 psychotherapy one time every 1-4 weeks (supportive, cognitive behavioral therapy)   3. Resume normal interest in activities, decrease crying spells: 1:1 psychotherapy one time every 1-4 weeks (supportive, cognitive behavioral therapy)   4. Improve assertiveness skills and the ability to set and maintain boundaries: 1:1 psychotherapy one time every 1-4 weeks (supportive, cognitive behavioral therapy)  Last Reviewed: 04/15/2013    Goals Addressed Today:   Goals 1 and 4  Impression/Diagnosis:   The patient presents with symptoms of anxiety and depression that developed as she was coping with the deaths of her father and husband and discovered information that indicate her husband may have had a child by another woman. Her current symptoms include depressed mood, crying spells, loss of appetite, sleep difficulty, memory difficulty, loss of interest in activities, excessive worrying, racing thoughts, and poor concentration. Diagnosis: Adjustment Disorder with Mixed Anxiety  and Depressed Mood.   Diagnosis:  Axis I:  Adjustment disorder with mixed anxiety and depressed mood          Axis II: Deferred

## 2013-08-02 ENCOUNTER — Ambulatory Visit (INDEPENDENT_AMBULATORY_CARE_PROVIDER_SITE_OTHER): Payer: Federal, State, Local not specified - PPO | Admitting: Psychiatry

## 2013-08-02 DIAGNOSIS — F4323 Adjustment disorder with mixed anxiety and depressed mood: Secondary | ICD-10-CM

## 2013-08-02 NOTE — Progress Notes (Signed)
Patient:  Lori [name]   DOB: 10/24/1951  MR Number: [id number]  Location: [facility]:  [address] [place],  [place], [address]  Start: 03-14-23 08/02/2013 1:00 PM End: Mar 14, 2059 1010//2014 1:55 PM  Provider/Observer:     Florencia Reasons, MSW, LCSW   Chief Complaint:      Chief Complaint  Patient presents with  . Anxiety  . Depression    Reason For Service:     The patient was referred for services by PA Sherie Don due to patient experiencing anxiety and depression. Per patient's report, her father died in May 21, 2013and her husband died suddenly 3 months later. I the course of managing financial issues since her husband's death, patient has discovered information that indicate her husband may have had a child by another woman. Patient is distraught and fears that her 104 year marriage may have been a lie. She also reports stress related to an estranged relationship with her 60 year old son from whom she has had no contact since her husband's funeral. Patient continues to experience unresolved grief and loss issues. She is seen for a followup appointment today.   Interventions Strategy:  Supportive therapy, cognitive behavioral therapy  Participation Level:   Active  Participation Quality:  Sharing      Behavioral Observation:  Casual, Alert, and talkative  Current Psychosocial Factors: The first anniversary of patient's husband's death was August 09, 2023. Patient still has had no contact from her son.  Content of Session:   Processing grief and loss issues, identifying memorializations, identifying coping statements,encouraging patient to use support system  Current Status:    Patient reports improved mood and decreased anxiety.  Patient Progress:   Patient states being grateful she has made it through the first year after her husband's death. She reports staying home alone on the anniversary of his death, compiling photographs of him and her as a couple,and  focusing on positive memories. She states she was at peace although she was alone. She expresses acceptance of her husband's death and states knowing that part of her life is over and that she has to move forward in life. Therapist works with patient to identify ways to reinvest in life and develop new relationships and social involvement. Patient is planning to accompany daughter to a boxing match this weekend and to participate in a ministry for the young children at her church on Sunday. Therapist provides patient with plan year day handouts. Patient also expresses acceptance of the current situation with her son and reports decreased worry. Patient also is using her spirituality as a source of strength.   Target Goals:  1. Process and resolve grief and loss issues: 1:1 psychotherapy one time every 1-4 weeks (supportive, cognitive behavioral therapy)   2. Decrease anxiety and excessive worrying:  1:1 psychotherapy one time every 1-4 weeks (supportive, cognitive behavioral therapy)   3. Resume normal interest in activities, decrease crying spells: 1:1 psychotherapy one time every 1-4 weeks (supportive, cognitive behavioral therapy)   4. Improve assertiveness skills and the ability to set and maintain boundaries: 1:1 psychotherapy one time every 1-4 weeks (supportive, cognitive behavioral therapy)  Last Reviewed: 04/16/2059    Goals Addressed Today:   Goals 1 and 3  Impression/Diagnosis:   The patient presents with symptoms of anxiety and depression that developed as she was coping with the deaths of her father and husband and discovered information that indicate her husband may have had a child by another woman. Her current symptoms  include depressed mood, crying spells, loss of appetite, sleep difficulty, memory difficulty, loss of interest in activities, excessive worrying, racing thoughts, and poor concentration. Diagnosis: Adjustment Disorder with Mixed Anxiety and Depressed  Mood.   Diagnosis:  Axis I:  Adjustment disorder with mixed anxiety and depressed mood          Axis II: Deferred

## 2013-08-02 NOTE — Patient Instructions (Signed)
Discussed orally 

## 2013-08-16 ENCOUNTER — Ambulatory Visit (INDEPENDENT_AMBULATORY_CARE_PROVIDER_SITE_OTHER): Payer: Federal, State, Local not specified - PPO | Admitting: Psychiatry

## 2013-08-16 DIAGNOSIS — F4323 Adjustment disorder with mixed anxiety and depressed mood: Secondary | ICD-10-CM

## 2013-08-16 NOTE — Patient Instructions (Signed)
Discussed orally 

## 2013-08-16 NOTE — Progress Notes (Signed)
Patient:  Lori [name]   DOB: 01/11/1951  MR Number: [id number]  Location[facility]:  [address] [place],  [place], [address]  Start: 03-27-2023 08/16/2013 2:00 PM End: 03/27/23 1024//2014 2:55 PM  Provider/Observer:     Florencia Reasons, MSW, LCSW   Chief Complaint:      Chief Complaint  Patient presents with  . Anxiety  . Depression    Reason For Service:     The patient was referred for services by PA Sherie Don due to patient experiencing anxiety and depression. Per patient's report, her father died in June 03, 2013and her husband died suddenly 3 months later. I the course of managing financial issues since her husband's death, patient has discovered information that indicate her husband may have had a child by another woman. Patient is distraught and fears that her 60 year marriage may have been a lie. She also reports stress related to an estranged relationship with her 60 year old son from whom she has had no contact since her husband's funeral. Patient continues to experience unresolved grief and loss issues. She is seen for a followup appointment today.   Interventions Strategy:  Supportive therapy, cognitive behavioral therapy  Participation Level:   Active  Participation Quality:  Sharing      Behavioral Observation:  Casual, Alert, and talkative  Current Psychosocial Factors:   Content of Session:   Reinforcing patient's efforts to increase involvement in activity and reinvest in life, identifying coping statements, identifying ways to set and maintain boundaries   Current Status:    Patient reports improved mood and decreased anxiety.  Patient Progress:   Patient reports continued efforts to increase involvement in activity. She recently went to the beach for the weekend. She hopes to go to the fair tomorrow with her daughter. 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 She has already completed an application but expresses some concern regarding her  neighbors' opinions as one of her neighbors has talked negatively about patient's effort. Therapist works with patient to identify ways to address boundary issues and improve assertiveness skills. Patient continues to Miss husband but is able to discuss memories today without being overwhelmed. Patient has been using plan your day handouts and states being pleased with her progress and being able to see her accomplishments.   Target Goals:  1. Process and resolve grief and loss issues: 1:1 psychotherapy one time every 1-4 weeks (supportive, cognitive behavioral therapy)   2. Decrease anxiety and excessive worrying:  1:1 psychotherapy one time every 1-4 weeks (supportive, cognitive behavioral therapy)   3. Resume normal interest in activities, decrease crying spells: 1:1 psychotherapy one time every 1-4 weeks (supportive, cognitive behavioral therapy)   4. Improve assertiveness skills and the ability to set and maintain boundaries: 1:1 psychotherapy one time every 1-4 weeks (supportive, cognitive behavioral therapy)  Last Reviewed: 04/15/2013    Goals Addressed Today:   Goals 1, 3, and 4  Impression/Diagnosis:   The patient presents with symptoms of anxiety and depression that developed as she was coping with the deaths of her father and husband and discovered information that indicate her husband may have had a child by another woman. Her current symptoms include depressed mood, crying spells, loss of appetite, sleep difficulty, memory difficulty, loss of interest in activities, excessive worrying, racing thoughts, and poor concentration. Diagnosis: Adjustment Disorder with Mixed Anxiety and Depressed Mood.   Diagnosis:  Axis I:  Adjustment disorder with mixed anxiety and depressed mood  Axis II: Deferred

## 2013-09-06 ENCOUNTER — Ambulatory Visit (INDEPENDENT_AMBULATORY_CARE_PROVIDER_SITE_OTHER): Payer: Federal, State, Local not specified - PPO | Admitting: Psychiatry

## 2013-09-06 DIAGNOSIS — F4323 Adjustment disorder with mixed anxiety and depressed mood: Secondary | ICD-10-CM

## 2013-09-06 NOTE — Progress Notes (Signed)
Patient:  Lori [name]   DOB: Apr 05, 1951  MR Number: [id number]  Location: [facility]:  [address] [place],  [place], [address]  Start: 03/24/23 09/06/2013 9:00 AM End: Mar 24, 2023 09/06/2013 9:55 AM  Provider/Observer:     Florencia Reasons, MSW, LCSW   Chief Complaint:      Chief Complaint  Patient presents with  . Depression    Reason For Service:     The patient was referred for services by PA Sherie Don due to patient experiencing anxiety and depression. Per patient's report, her father died in 05-31-2013and her husband died suddenly 3 months later. I the course of managing financial issues since her husband's death, patient has discovered information that indicate her husband may have had a child by another woman. Patient is distraught and fears that her 19 year marriage may have been a lie. She also reports stress related to an estranged relationship with her 31 year old son from whom she has had no contact since her husband's funeral. Patient continues to experience unresolved grief and loss issues. She is seen for a followup appointment today.   Interventions Strategy:  Supportive therapy, cognitive behavioral therapy  Participation Level:   Active  Participation Quality:  Sharing      Behavioral Observation:  Casual, Alert, and talkative  Current Psychosocial Factors: Patient reconnected with her son last weekend.  Content of Session:   Reinforcing patient's efforts to increase involvement in activity and reinvest in life, processing feelings, reinforcing patient's use of assertiveness skills and efforts to set and maintain boundaries  Current Status:    Patient reports continued improved mood and decreased anxiety.  Patient Progress:   Patient reports reconnecting with her son this past weekend at a sporting event for her grandson. Patient reports being very happy to see son and says they have talked with each other on the phone this week. She also plans to see  her son tomorrow and is planning to ask him to consider spending Thanksgiving with her and her daughter She and her daughter plan to go to [place] for Thanksgiving and patient hopes her son, his girlfriend, and her grandson will go as well. She reports she and her husband celebrated Thanksgiving in an [place] a few times .Therapist works with patient to process her feelings as well as respect son's boundaries. Patient also reports being helpful and supportive to a friend this past weekend.She reports she did not get the job in [place] but is not distressed by this. She is pleased with her use of assertiveness skills recently when she said no about a church event she does not want to attend. Patient is more hopeful and optimistic.   Target Goals:  1. Process and resolve grief and loss issues: 1:1 psychotherapy one time every 1-4 weeks (supportive, cognitive behavioral therapy)   2. Decrease anxiety and excessive worrying:  1:1 psychotherapy one time every 1-4 weeks (supportive, cognitive behavioral therapy)   3. Resume normal interest in activities, decrease crying spells: 1:1 psychotherapy one time every 1-4 weeks (supportive, cognitive behavioral therapy)   4. Improve assertiveness skills and the ability to set and maintain boundaries: 1:1 psychotherapy one time every 1-4 weeks (supportive, cognitive behavioral therapy)  Last Reviewed: 04/15/2013    Goals Addressed Today:   Goals 1, 2, 3, and 4  Impression/Diagnosis:   The patient presents with symptoms of anxiety and depression that developed as she was coping with the deaths of her father and husband and discovered information  that indicate her husband may have had a child by another woman. Her current symptoms include depressed mood, crying spells, loss of appetite, sleep difficulty, memory difficulty, loss of interest in activities, excessive worrying, racing thoughts, and poor concentration. Diagnosis: Adjustment Disorder with Mixed Anxiety  and Depressed Mood.   Diagnosis:  Axis I:  Adjustment disorder with mixed anxiety and depressed mood          Axis II: Deferred

## 2013-09-06 NOTE — Patient Instructions (Signed)
Discussed orally 

## 2013-09-27 ENCOUNTER — Telehealth: Payer: Self-pay | Admitting: Nurse Practitioner

## 2013-09-27 ENCOUNTER — Ambulatory Visit (INDEPENDENT_AMBULATORY_CARE_PROVIDER_SITE_OTHER): Payer: Federal, State, Local not specified - PPO | Admitting: Psychiatry

## 2013-09-27 DIAGNOSIS — F4323 Adjustment disorder with mixed anxiety and depressed mood: Secondary | ICD-10-CM

## 2013-09-27 NOTE — Patient Instructions (Signed)
Discussed orally 

## 2013-09-27 NOTE — Progress Notes (Addendum)
Patient:  Lori [name]   DOB: 20-Jul-1951  MR Number: [id number]  Location: [facility]:  [address] [place],  [place], [address]  Start: 29-Mar-2023 09/27/2013 11:00 AM End: 03-29-23 09/27/2013 11:55 AM  Provider/Observer:     Florencia Reasons, MSW, LCSW   Chief Complaint:      Chief Complaint  Patient presents with  . Anxiety    Reason For Service:     The patient was referred for services by PA Sherie Don due to patient experiencing anxiety and depression. Per patient's report, her father died in 06-05-13and her husband died suddenly 3 months later. I the course of managing financial issues since her husband's death, patient has discovered information that indicate her husband may have had a child by another woman. Patient is distraught and fears that her 60 year marriage may have been a lie. She also reports stress related to an estranged relationship with her 60 year old son from whom she has had no contact since her husband's funeral. Patient continues to experience unresolved grief and loss issues. She is seen for a followup appointment today.   Interventions Strategy:  Supportive therapy, cognitive behavioral therapy  Participation Level:   Active  Participation Quality:  Sharing      Behavioral Observation:  Casual, Alert, and talkative  Current Psychosocial Factors: Patient reports stress related to recent interactions with her siblings and her  mother-in-law.  Content of Session:   Reinforcing patient's efforts to increase involvement in activity and reinvest in life, processing feelings, identifying and challenging cognitive distortions  Current Status:    Patient reports continued involvement in activity but increased sadness and tearfulness  Patient Progress:   Patient reports enjoying celebrating Thanksgiving with grandson, daughter, and daughter's friend by going to [place]. She reports recalling positive memories of she and her husband celebrating  Thanksgiving in an [place] during the trip without becoming overwhelmed. However, patient reports decreasing her antidepressant and experiencing increased sadness and difficulty managing stress the latter part of the weekend. Patient continues to express frustration about taking medication and the possibility of being  on antidepressant the rest of her life. Patient also reports stress regarding recent interactions with her siblings and her mother-in-law. Therapist works with patient to process feelings and identify and challenge cognitive distortions.   Target Goals:  1. Process and resolve grief and loss issues: 1:1 psychotherapy one time every 1-4 weeks (supportive, cognitive behavioral therapy)   2. Decrease anxiety and excessive worrying:  1:1 psychotherapy one time every 1-4 weeks (supportive, cognitive behavioral therapy)   3. Resume normal interest in activities, decrease crying spells: 1:1 psychotherapy one time every 1-4 weeks (supportive, cognitive behavioral therapy)   4. Improve assertiveness skills and the ability to set and maintain boundaries: 1:1 psychotherapy one time every 1-4 weeks (supportive, cognitive behavioral therapy)  Last Reviewed: 04/15/2013    Goals Addressed Today:   Goals 1, 2, 3  Impression/Diagnosis:   The patient presents with symptoms of anxiety and depression that developed as she was coping with the deaths of her father and husband and discovered information that indicate her husband may have had a child by another woman. Her current symptoms include depressed mood, crying spells, loss of appetite, sleep difficulty, memory difficulty, loss of interest in activities, excessive worrying, racing thoughts, and poor concentration. Diagnosis: Adjustment Disorder with Mixed Anxiety and Depressed Mood.   Diagnosis:  Axis I:  Adjustment disorder with mixed anxiety and depressed mood  Axis II: Deferred

## 2013-09-27 NOTE — Telephone Encounter (Signed)
Patient does not have anymore refills on her Citalopram and Lorazepam. Should she make an appointment? She isn't sure if she needs to continue these meds at the current time. She said she has tried weaning herself off, but just can't do it.  [facility]

## 2013-09-29 ENCOUNTER — Other Ambulatory Visit: Payer: Self-pay | Admitting: Nurse Practitioner

## 2013-09-29 MED ORDER — CITALOPRAM HYDROBROMIDE 20 MG PO TABS: ORAL_TABLET | ORAL | Status: DC

## 2013-09-29 MED ORDER — LORAZEPAM 0.5 MG PO TABS: ORAL_TABLET | ORAL | Status: DC

## 2013-09-29 NOTE — Telephone Encounter (Signed)
Recommend she continue medication at this time. Hopefully she will be able to wean off in the future. I will send in one month refill for both medications to give her time to make appt. Recommend office visit to discuss.

## 2013-09-30 NOTE — Telephone Encounter (Signed)
Discussed with patient and transferred up front to make a f/u appt. Pt verbalized understanding.

## 2013-10-04 ENCOUNTER — Ambulatory Visit (INDEPENDENT_AMBULATORY_CARE_PROVIDER_SITE_OTHER): Payer: Federal, State, Local not specified - PPO | Admitting: Nurse Practitioner

## 2013-10-04 ENCOUNTER — Encounter: Payer: Self-pay | Admitting: Nurse Practitioner

## 2013-10-04 VITALS — BP 130/86 | Ht 64.5 in | Wt 172.2 lb

## 2013-10-04 DIAGNOSIS — F329 Major depressive disorder, single episode, unspecified: Secondary | ICD-10-CM

## 2013-10-04 DIAGNOSIS — G4700 Insomnia, unspecified: Secondary | ICD-10-CM

## 2013-10-04 DIAGNOSIS — F32A Depression, unspecified: Secondary | ICD-10-CM

## 2013-10-04 MED ORDER — LORAZEPAM 0.5 MG PO TABS: ORAL_TABLET | ORAL | Status: DC

## 2013-10-04 MED ORDER — CITALOPRAM HYDROBROMIDE 20 MG PO TABS: ORAL_TABLET | ORAL | Status: DC

## 2013-10-07 ENCOUNTER — Encounter: Payer: Self-pay | Admitting: Nurse Practitioner

## 2013-10-07 NOTE — Assessment & Plan Note (Signed)
Medications  . LORazepam (ATIVAN) 0.5 MG tablet    Sig: Take 1/2-1 tablet po TID prn    Dispense:  45 tablet    Refill:  2    [facility]    Order Specific Question:  Supervising Provider    Answer:  Merlyn Albert [[id number]]  . citalopram (CELEXA) 20 MG tablet    Sig: Take 1 1/2 tablet po daily    Dispense:  45 tablet    Refill:  5    Order Specific Question:  Supervising Provider    Answer:  Riccardo Dubin   Continue current regimen for now. Recheck in 4 months, call back sooner if any problems.

## 2013-10-07 NOTE — Progress Notes (Signed)
Subjective:  Presents for routine followup. Continues to receive mental health counseling for depression and grief. Has tried to wean off her medications recently but this did not work very well. Doing well on her current dosing. Sleeping well at nighttime. Has made a lot of progress.    Objective:   BP 130/86  Ht 5' 4.5" (1.638 m)  Wt 172 lb 3.2 oz (78.109 kg)  BMI 29.11 kg/m2  NAD. Alert, oriented. Cheerful affect. Lungs clear. Heart regular rate rhythm.  Assessment:Depression  Insomnia  Plan: Meds ordered this encounter  Medications  . LORazepam (ATIVAN) 0.5 MG tablet    Sig: Take 1/2-1 tablet po TID prn    Dispense:  45 tablet    Refill:  2    [facility]    Order Specific Question:  Supervising Provider    Answer:  Merlyn Albert [[id number]]  . citalopram (CELEXA) 20 MG tablet    Sig: Take 1 1/2 tablet po daily    Dispense:  45 tablet    Refill:  5    Order Specific Question:  Supervising Provider    Answer:  Riccardo Dubin   Continue current regimen for now. Recheck in 4 months, call back sooner if any problems.

## 2013-10-07 NOTE — Assessment & Plan Note (Signed)
Medications  . LORazepam (ATIVAN) 0.5 MG tablet    Sig: Take 1/2-1 tablet po TID prn    Dispense:  45 tablet    Refill:  2    [facility]    Order Specific Question:  Supervising Provider    Answer:  LUKING, WILLIAM S [[id number]]  . citalopram (CELEXA) 20 MG tablet    Sig: Take 1 1/2 tablet po daily    Dispense:  45 tablet    Refill:  5    Order Specific Question:  Supervising Provider    Answer:  LUKING, WILLIAM S [[id number]]   Continue current regimen for now. Recheck in 4 months, call back sooner if any problems. 

## 2013-10-14 ENCOUNTER — Ambulatory Visit (INDEPENDENT_AMBULATORY_CARE_PROVIDER_SITE_OTHER): Payer: Federal, State, Local not specified - PPO | Admitting: Psychiatry

## 2013-10-14 DIAGNOSIS — F4323 Adjustment disorder with mixed anxiety and depressed mood: Secondary | ICD-10-CM

## 2013-10-14 NOTE — Progress Notes (Signed)
Patient:  Lori [name]   DOB: 22-Jul-1951  MR Number: [id number]  [facility]:  [address] [place],  [place], [address]  Start: Monday 10/14/2013 11:10 AM End: Monday 10/14/2013 11:55 AM  Provider/Observer:     Florencia Reasons, MSW, LCSW   Chief Complaint:      Chief Complaint  Patient presents with  . Anxiety    Reason For Service:     The patient was referred for services by PA Sherie Don due to patient experiencing anxiety and depression. Per patient's report, her father died in 2013-05-29and her husband died suddenly 3 months later. I the course of managing financial issues since her husband's death, patient has discovered information that indicate her husband may have had a child by another woman. Patient is distraught and fears that her 60 year marriage may have been a lie. She also reports stress related to a strained relationship with her 60 year old son. She is seen for a followup appointment today.   Interventions Strategy:  Supportive therapy, cognitive behavioral therapy  Participation Level:   Active  Participation Quality:  Sharing      Behavioral Observation:  Casual, Alert, and talkative  Current Psychosocial Factors:  Content of Session:   Reinforcing patient's efforts to increase involvement in activity and reinvest in life, processing feelings, discussing patient's efforts to identify and challenge cognitive distortions  Current Status:    Patient reports positive mood, decreased anxiety, positive sleep pattern, increased activity, and increased social involvement.  Patient Progress:   Patient reports he has been doing very well since last session. She has made successful efforts in identifying and challenging cognitive distortions. She reports being more positive. Patient also has been relying on her spirituality and states praying for peace and being at peace. 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 She has increased social involvement interacting with family, friends, and neighbors. Patient talks about husband today without becoming overwhelmed. She reports she and her daughter recently went to his grave to place a wreath and to see the plaque that was recently installed. Patient reports being at peace when visiting the grave. She is looking forward to having Christmas dinner at her home this year with her daughter and friends who arrived yesterday and will be staying at patient's home during the holidays.  She has talked with her son who doesn't know if he will be visiting her on Christmas. However, patient is not dwelling on this but is thankful for the contact that she has with her son. She no longer expresses anger and frustration regarding her siblings but expresses acceptance of their relationship. She reports that she plans to call them on Christmas day.   Target Goals:  1. Process and resolve grief and loss issues: 1:1 psychotherapy one time every 1-4 weeks (supportive, cognitive behavioral therapy)   2. Decrease anxiety and excessive worrying:  1:1 psychotherapy one time every 1-4 weeks (supportive, cognitive behavioral therapy)   3. Resume normal interest in activities, decrease crying spells: 1:1 psychotherapy one time every 1-4 weeks (supportive, cognitive behavioral therapy)   4. Improve assertiveness skills and the ability to set and maintain boundaries: 1:1 psychotherapy one time every 1-4 weeks (supportive, cognitive behavioral therapy)  Last Reviewed: 04/15/2013    Goals Addressed Today:   Goals 1, 2, 3  Impression/Diagnosis:   The patient presents with symptoms of anxiety and depression that developed as she was coping with  the deaths of her father and husband and discovered information that indicate her husband may have had a child by another woman. Her current symptoms include depressed mood, crying spells, loss of appetite, sleep difficulty, memory  difficulty, loss of interest in activities, excessive worrying, racing thoughts, and poor concentration. Diagnosis: Adjustment Disorder with Mixed Anxiety and Depressed Mood.   Diagnosis:  Axis I:  Adjustment disorder with mixed anxiety and depressed mood          Axis II: Deferred

## 2013-10-14 NOTE — Patient Instructions (Signed)
Discussed orally 

## 2013-11-01 ENCOUNTER — Encounter: Payer: Self-pay | Admitting: Nurse Practitioner

## 2013-11-01 ENCOUNTER — Ambulatory Visit (INDEPENDENT_AMBULATORY_CARE_PROVIDER_SITE_OTHER): Payer: Federal, State, Local not specified - PPO | Admitting: Nurse Practitioner

## 2013-11-01 ENCOUNTER — Other Ambulatory Visit: Payer: Self-pay | Admitting: Family Medicine

## 2013-11-01 VITALS — BP 128/82 | Ht 64.5 in | Wt 169.6 lb

## 2013-11-01 DIAGNOSIS — E785 Hyperlipidemia, unspecified: Secondary | ICD-10-CM

## 2013-11-01 DIAGNOSIS — Z01419 Encounter for gynecological examination (general) (routine) without abnormal findings: Secondary | ICD-10-CM

## 2013-11-01 DIAGNOSIS — Z0000 Encounter for general adult medical examination without abnormal findings: Secondary | ICD-10-CM

## 2013-11-01 DIAGNOSIS — M8580 Other specified disorders of bone density and structure, unspecified site: Secondary | ICD-10-CM

## 2013-11-01 DIAGNOSIS — M899 Disorder of bone, unspecified: Secondary | ICD-10-CM

## 2013-11-01 DIAGNOSIS — M949 Disorder of cartilage, unspecified: Secondary | ICD-10-CM

## 2013-11-01 DIAGNOSIS — Z139 Encounter for screening, unspecified: Secondary | ICD-10-CM

## 2013-11-04 ENCOUNTER — Encounter: Payer: Self-pay | Admitting: Nurse Practitioner

## 2013-11-04 DIAGNOSIS — M8580 Other specified disorders of bone density and structure, unspecified site: Secondary | ICD-10-CM | POA: Insufficient documentation

## 2013-11-04 NOTE — Progress Notes (Signed)
   Subjective:    Patient ID: Lori [name], female    DOB: January 23, 1951, 63 y.o.   MRN: [id number]  HPI presents for her wellness checkup. Depression symptoms are much improved. Regular eye exams. Regular dental exams. Has had her flu vaccine. Does not take calcium and vitamin D on a regular basis. Has had a hysterectomy and BSO. No pelvic pain. No discharge. No new sexual partners.    Review of Systems  Constitutional: Negative for activity change, appetite change and fatigue.  HENT: Negative for dental problem, ear pain, hearing loss, sinus pressure and sore throat.   Respiratory: Negative for cough, chest tightness, shortness of breath and wheezing.   Cardiovascular: Negative for chest pain and leg swelling.  Gastrointestinal: Negative for nausea, vomiting, abdominal pain, diarrhea, constipation and blood in stool.  Genitourinary: Negative for dysuria, urgency, frequency, vaginal discharge, difficulty urinating, genital sores and pelvic pain.       Objective:   Physical Exam  Vitals reviewed. Constitutional: She is oriented to person, place, and time. She appears well-developed. No distress.  HENT:  Right Ear: External ear normal.  Left Ear: External ear normal.  Mouth/Throat: Oropharynx is clear and moist.  Neck: Normal range of motion. Neck supple. No tracheal deviation present. No thyromegaly present.  Cardiovascular: Normal rate, regular rhythm and normal heart sounds.  Exam reveals no gallop.   No murmur heard. Pulmonary/Chest: Effort normal and breath sounds normal.  Abdominal: Soft. She exhibits no distension. There is no tenderness.  Musculoskeletal: She exhibits no edema.  Lymphadenopathy:    She has no cervical adenopathy.  Neurological: She is alert and oriented to person, place, and time.  Skin: Skin is warm and dry. No rash noted.  Psychiatric: She has a normal mood and affect. Her behavior is normal.   Breast exam: No masses noted, axilla no adenopathy. GU exam  deferred, denies any problems. Defers rectal exam.        Assessment & Plan:  Well woman exam  Routine general medical examination at a health care facility - Plan: Basic metabolic panel, Hepatic function panel, Lipid panel  Hyperlipemia - Plan: Lipid panel  Osteopenia  Patient has contacted her GI specialist to find out when her next colonoscopy is due. Encouraged daily vitamin D and calcium. Consider updated bone density study. Recheck in 3-4 months. Next physical in one year.

## 2013-11-06 ENCOUNTER — Ambulatory Visit (INDEPENDENT_AMBULATORY_CARE_PROVIDER_SITE_OTHER): Payer: Federal, State, Local not specified - PPO | Admitting: Psychiatry

## 2013-11-06 DIAGNOSIS — F4323 Adjustment disorder with mixed anxiety and depressed mood: Secondary | ICD-10-CM

## 2013-11-06 NOTE — Progress Notes (Addendum)
Patient:  Lori [name]   DOB: [birth year]  MR Number: [id number]  Location: [facility]:  [address] [place],  [place], [address]  Start: Wednesday 11/06/2013 11:10 AM End: Wednesday 11/06/2013 11:55 AM  Provider/Observer:     Maurice Small, MSW, LCSW   Chief Complaint:      Chief Complaint  Patient presents with  . Anxiety    Reason For Service:     The patient was referred for services by PA Pearson Forster due to patient experiencing anxiety and depression. Per patient's report, her father died in March 29, 2012 and her husband died suddenly 3 months later. I the course of managing financial issues since her husband's death, patient has discovered information that indicate her husband may have had a child by another woman. Patient is distraught and fears that her 43 year marriage may have been a lie. She also reports stress related to a strained relationship with her 91 year old son. She is seen for a followup appointment today.   Interventions Strategy:  Supportive therapy, cognitive behavioral therapy  Participation Level:   Active  Participation Quality:  Sharing      Behavioral Observation:  Casual, Alert, and talkative  Current Psychosocial Factors:  Content of Session:   Reinforcing patient's efforts to increase involvement in activity and reinvest in life, processing feelings,reviewing treatment plan  Current Status:    Patient reports positive mood, decreased anxiety, positive sleep pattern, increased activity, and increased social involvement.  Patient Progress:   Patient reports managing the holidays well celebrating with family and friends. She expresses disappointment that son did not visit her on Christmas but was not overwhelmed by this. However, she reports visiting her son last week. She reports having a crying spell a week ago when she heard a song. It triggered memories of her husband. However she was able to cope in a healthy way and had support from her  fellow church members. Therapist works with patient to process feelings and to normalize her feelings. She shares that she has met a female friend and has gone out to lunch with him a few times. She reports being excited but also being cautious. Therapist and patient also reviewed goals. Patient rates herself at an 8 on goals 1, 3, and 4 and 6 on goal 2 on a 10 point scale with 0 being not accomplished at all and 10 being accomplished. Therapist and patient will plan to continue to work on relaxation techniques. Therapist also works with patient to begin to identify ways to explore increased interests, self-development, and purpose.    Target Goals:  1. Process and resolve grief and loss issues: 1:1 psychotherapy one time every 1-4 weeks (supportive, cognitive behavioral therapy)   2. Decrease anxiety and excessive worrying:  1:1 psychotherapy one time every 1-4 weeks (supportive, cognitive behavioral therapy)   3. Resume normal interest in activities, decrease crying spells: 1:1 psychotherapy one time every 1-4 weeks (supportive, cognitive behavioral therapy)   4. Improve assertiveness skills and the ability to set and maintain boundaries: 1:1 psychotherapy one time every 1-4 weeks (supportive, cognitive behavioral therapy)  Last Reviewed: 11/06/2013    Goals Addressed Today:   Goals 1, 2, 3  Impression/Diagnosis:   The patient presents with symptoms of anxiety and depression that developed as she was coping with the deaths of her father and husband and discovered information that indicate her husband may have had a child by another woman. Her current symptoms include depressed mood, crying spells, loss  of appetite, sleep difficulty, memory difficulty, loss of interest in activities, excessive worrying, racing thoughts, and poor concentration. Diagnosis: Adjustment Disorder with Mixed Anxiety and Depressed Mood.   Diagnosis:  Axis I:  Adjustment disorder with mixed anxiety and depressed  mood          Axis II: Deferred    

## 2013-11-06 NOTE — Patient Instructions (Signed)
Discussed orally 

## 2013-11-12 ENCOUNTER — Telehealth: Payer: Self-pay

## 2013-11-12 ENCOUNTER — Ambulatory Visit (HOSPITAL_COMMUNITY)
Admission: RE | Admit: 2013-11-12 | Discharge: 2013-11-12 | Disposition: A | Discharge status: Home / Self Care | Payer: Federal, State, Local not specified - PPO | Source: Ambulatory Visit | Attending: Family Medicine | Admitting: Family Medicine

## 2013-11-12 DIAGNOSIS — Z8781 Personal history of (healed) traumatic fracture: Secondary | ICD-10-CM | POA: Insufficient documentation

## 2013-11-12 DIAGNOSIS — Z780 Asymptomatic menopausal state: Secondary | ICD-10-CM | POA: Insufficient documentation

## 2013-11-12 DIAGNOSIS — Z139 Encounter for screening, unspecified: Secondary | ICD-10-CM

## 2013-11-12 DIAGNOSIS — M899 Disorder of bone, unspecified: Secondary | ICD-10-CM | POA: Insufficient documentation

## 2013-11-12 DIAGNOSIS — M949 Disorder of cartilage, unspecified: Secondary | ICD-10-CM

## 2013-11-12 DIAGNOSIS — Z13820 Encounter for screening for osteoporosis: Secondary | ICD-10-CM | POA: Insufficient documentation

## 2013-11-12 DIAGNOSIS — Z9079 Acquired absence of other genital organ(s): Secondary | ICD-10-CM | POA: Insufficient documentation

## 2013-11-12 DIAGNOSIS — Z1231 Encounter for screening mammogram for malignant neoplasm of breast: Secondary | ICD-10-CM | POA: Insufficient documentation

## 2013-11-12 LAB — HEPATIC FUNCTION PANEL
ALK PHOS: 64 U/L (ref 39–117)
ALT: 12 U/L (ref 0–35)
AST: 17 U/L (ref 0–37)
Albumin: 4.1 g/dL (ref 3.5–5.2)
BILIRUBIN INDIRECT: 0.7 mg/dL (ref 0.0–0.9)
Bilirubin, Direct: 0.2 mg/dL (ref 0.0–0.3)
TOTAL PROTEIN: 6.3 g/dL (ref 6.0–8.3)
Total Bilirubin: 0.9 mg/dL (ref 0.3–1.2)

## 2013-11-12 LAB — LIPID PANEL
Cholesterol: 218 mg/dL — ABNORMAL HIGH (ref 0–200)
HDL: 85 mg/dL (ref >39)
LDL CALC: 119 mg/dL — AB (ref 0–99)
Total CHOL/HDL Ratio: 2.6 Ratio
Triglycerides: 69 mg/dL (ref <150)
VLDL: 14 mg/dL (ref 0–40)

## 2013-11-12 LAB — BASIC METABOLIC PANEL
BUN: 11 mg/dL (ref 6–23)
CO2: 30 meq/L (ref 19–32)
Calcium: 9.2 mg/dL (ref 8.4–10.5)
Chloride: 101 mEq/L (ref 96–112)
Creat: 0.76 mg/dL (ref 0.50–1.10)
GLUCOSE: 106 mg/dL — AB (ref 70–99)
Potassium: 4.2 mEq/L (ref 3.5–5.3)
SODIUM: 141 meq/L (ref 135–145)

## 2013-11-12 NOTE — Telephone Encounter (Signed)
Pt came by the office today and said Dr. Wolfgang Phoenix was referring her for screening colonoscopy. Manuela Schwartz took message and I called and [facility] for a return call.

## 2013-11-13 NOTE — Telephone Encounter (Signed)
Called and [facility] for a return call.  

## 2013-11-14 NOTE — Progress Notes (Signed)
Patient notified and verbalized understanding. I transferred her up front to make an appt with Hoyle Sauer.

## 2013-11-18 ENCOUNTER — Ambulatory Visit (INDEPENDENT_AMBULATORY_CARE_PROVIDER_SITE_OTHER): Payer: Federal, State, Local not specified - PPO | Admitting: Nurse Practitioner

## 2013-11-18 ENCOUNTER — Encounter: Payer: Self-pay | Admitting: Nurse Practitioner

## 2013-11-18 VITALS — BP 120/84 | Ht 64.5 in | Wt 171.2 lb

## 2013-11-18 DIAGNOSIS — M8580 Other specified disorders of bone density and structure, unspecified site: Secondary | ICD-10-CM

## 2013-11-18 DIAGNOSIS — M949 Disorder of cartilage, unspecified: Secondary | ICD-10-CM

## 2013-11-18 DIAGNOSIS — M899 Disorder of bone, unspecified: Secondary | ICD-10-CM

## 2013-11-21 ENCOUNTER — Encounter: Payer: Self-pay | Admitting: Nurse Practitioner

## 2013-11-21 NOTE — Progress Notes (Signed)
Subjective:  Presents to discuss her most recent bone density study. Continues to take daily calcium and vitamin D supplementation.  Objective:   BP 120/84  Ht 5' 4.5" (1.638 m)  Wt 171 lb 4 oz (77.678 kg)  BMI 28.95 kg/m2 Bone density study dated 11/01/2013 shows continued osteopenia.  Assessment: Osteopenia  Plan: Discussed options and concerns. Patient elects to hold on any medication at this point. Continue vitamin D and calcium daily. Repeat bone density study in 2 years.

## 2013-11-21 NOTE — Assessment & Plan Note (Signed)
Plan: Discussed options and concerns. Patient elects to hold on any medication at this point. Continue vitamin D and calcium daily. Repeat bone density study in 2 years.

## 2013-11-29 ENCOUNTER — Ambulatory Visit (INDEPENDENT_AMBULATORY_CARE_PROVIDER_SITE_OTHER): Payer: Federal, State, Local not specified - PPO | Admitting: Psychiatry

## 2013-11-29 DIAGNOSIS — F4323 Adjustment disorder with mixed anxiety and depressed mood: Secondary | ICD-10-CM

## 2013-11-29 NOTE — Patient Instructions (Signed)
Discussed orally 

## 2013-11-29 NOTE — Progress Notes (Signed)
Patient:  Lori [name]   DOB: [birth year]  MR Number: [id number]  Location: [facility]:  [address] [place],  [place], [address]  Start: Friday 11/29/2013 11:10 AM End: Friday 11/29/2013 12:00 PM  Provider/Observer:     Maurice Small, MSW, LCSW   Chief Complaint:      Chief Complaint  Patient presents with  . Anxiety    Reason For Service:     The patient was referred for services by PA Pearson Forster due to patient experiencing anxiety and depression. Per patient's report, her father died in 2012-02-28 and her husband died suddenly 3 months later. I the course of managing financial issues since her husband's death, patient has discovered information that indicate her husband may have had a child by another woman. Patient is distraught and fears that her 61 year marriage may have been a lie. She also reports stress related to a strained relationship with her 61 year old son. She is seen for a followup appointment today.   Interventions Strategy:  Supportive therapy, cognitive behavioral therapy  Participation Level:   Active  Participation Quality:  Sharing      Behavioral Observation:  Casual, Alert, and talkative  Current Psychosocial Factors: Patient's son returned to her home yesterday for the first time since her husband's death  Content of Session:   processing feelings, identifying ways to improve self-care and achieve balance.  Current Status:    Patient reports positive mood, decreased anxiety, positive sleep pattern, but decreased activity and decreased efforts regarding self-care.  Patient Progress:   Patient reports son returned to her home yesterday for the first time since her husband's death. She reports being pleased to have her son home. She says he along with his son and her daughter all stayed at  her home last night. She does not know how long son will stay but is thankful for the moment. She and her son went to her husband's grave this morning. She  also reports she and son had a long conversation about the past 2 years, their separation, and her feelings. She reports son is much more positive and encouraging to patient. She is hopeful about rebuilding their relationship. Patient reports decreased efforts and decreased activity regarding self care. She also reports beginning to become overwhelmed with taking care of first of the year financial issues and taxes. Therapist works with patient to identify ways to improve and maintain self-care efforts as well as ways to organize,  Compartmentalize, and achieve balance.    Target Goals:  1. Process and resolve grief and loss issues: 1:1 psychotherapy one time every 1-4 weeks (supportive, cognitive behavioral therapy)   2. Decrease anxiety and excessive worrying:  1:1 psychotherapy one time every 1-4 weeks (supportive, cognitive behavioral therapy)   3. Resume normal interest in activities, decrease crying spells: 1:1 psychotherapy one time every 1-4 weeks (supportive, cognitive behavioral therapy)   4. Improve assertiveness skills and the ability to set and maintain boundaries: 1:1 psychotherapy one time every 1-4 weeks (supportive, cognitive behavioral therapy)  Last Reviewed: 11/06/2013    Goals Addressed Today:   Goals 1, 2, 3  Impression/Diagnosis:   The patient presents with symptoms of anxiety and depression that developed as she was coping with the deaths of her father and husband and discovered information that indicate her husband may have had a child by another woman. Her current symptoms include depressed mood, crying spells, loss of appetite, sleep difficulty, memory difficulty, loss of interest in activities,  excessive worrying, racing thoughts, and poor concentration. Diagnosis: Adjustment Disorder with Mixed Anxiety and Depressed Mood.   Diagnosis:  Axis I:  Adjustment disorder with mixed anxiety and depressed mood          Axis II: Deferred

## 2013-12-20 ENCOUNTER — Ambulatory Visit (HOSPITAL_COMMUNITY): Payer: Self-pay | Admitting: Psychiatry

## 2014-01-09 ENCOUNTER — Ambulatory Visit (HOSPITAL_COMMUNITY): Payer: Self-pay | Admitting: Psychiatry

## 2014-01-23 ENCOUNTER — Ambulatory Visit (INDEPENDENT_AMBULATORY_CARE_PROVIDER_SITE_OTHER): Payer: Federal, State, Local not specified - PPO | Admitting: Psychiatry

## 2014-01-23 DIAGNOSIS — F4323 Adjustment disorder with mixed anxiety and depressed mood: Secondary | ICD-10-CM

## 2014-01-23 NOTE — Progress Notes (Addendum)
   THERAPIST PROGRESS NOTE  Session Time: Thursday 01/23/2014 2:00 Pm - 2:45 PM  Participation Level: Active  Behavioral Response: Well GroomedAlertEuthymic  Type of Therapy: Individual Therapy  Treatment Goals addressed: Resume normal interest in activities, decrease crying spells:   Interventions: Supportive  Summary: Lori [name] is a 63 y.o. female who presents with symptoms of anxiety and depression that developed as she was coping with the deaths of her father and husband and discovered information that indicate her husband may have had a child by another woman. Her  symptoms included depressed mood, crying spells, loss of appetite, sleep difficulty, memory difficulty, loss of interest in activities, excessive worrying, racing thoughts, and poor concentration.  Since her last session 2 months ago, patient has continued to do very well. She maintained involvement in activities until she had surgery a week ago. Her son and daughter continue to reside in her home. She is enjoying life and states laughing more. She also reports not having crying spells in a long time. She is able to talk about decreased husband without being overwhelmed. Patient is reinvesting in life and also is opened to the possibility of having another relationship in the future. She states feeling whole and complete.    Suicidal/Homicidal: No  Therapist Response: Therapist works with patient to process feelings and to review progress. Therapist and patient agree goals have been accomplished. Therefore, psychotherapy services will be discontinued at this time. Patient will continue to see PA Pearson Forster for medication management. Patient is encouraged to contact this practice should she need services in the future  Plan: Psychotherapy services are discontinued at this time. Patient will continue to see PA Truman Hayward for medication management. Patient is encouraged to contact this practice should she need  services in the future.  Diagnosis: Axis I: Adjustment disorder with mixed anxiety and depressed mood    Axis II: No diagnosis    [name], LCSW 01/23/2014      Outpatient Therapist Discharge Summary  Lori [name]    1950-10-30   Admission Date:  11/08/2012 Discharge Date:  01/23/2014 Reason for Discharge:  Treatment completed Diagnosis:  Axis I:  Adjustment disorder with mixed anxiety and depressed mood   [name] LCSW

## 2014-01-23 NOTE — Telephone Encounter (Signed)
I called pt. She has had surgery on her foot and will not be able to do the colonoscopy for 8-10 weeks.   She was due her next colonoscopy in 11/2013. Last colonoscopy done by Dr. Gala Romney on 12/09/2008. It was normal.   She previously had a colonoscopy in 2006 by Dr. Lyla Son in [place] and had an adenoma removed.   She will need OV when she calls back before she schedules the colonoscopy.

## 2014-01-23 NOTE — Patient Instructions (Signed)
Discussed orally 

## 2014-01-29 NOTE — Telephone Encounter (Signed)
Pt stated that she will call us when everything with her foot is better. She when back to the foot doctor and they put her in another boot for a month.

## 2014-02-02 ENCOUNTER — Emergency Department (HOSPITAL_COMMUNITY)
Admission: EM | Admit: 2014-02-02 | Discharge: 2014-02-02 | Disposition: A | Discharge status: Home / Self Care | Payer: Federal, State, Local not specified - PPO | Source: Home / Self Care | Attending: Family Medicine | Admitting: Family Medicine

## 2014-02-02 ENCOUNTER — Encounter (HOSPITAL_COMMUNITY): Payer: Self-pay | Admitting: Emergency Medicine

## 2014-02-02 DIAGNOSIS — L501 Idiopathic urticaria: Secondary | ICD-10-CM

## 2014-02-02 MED ORDER — METHYLPREDNISOLONE ACETATE 40 MG/ML IJ SUSP
80.0000 mg | Freq: Once | INTRAMUSCULAR | Status: AC
  Administered 2014-02-02: 80 mg via INTRAMUSCULAR

## 2014-02-02 MED ORDER — HYDROXYZINE HCL 50 MG PO TABS: 50.0000 mg | ORAL_TABLET | Freq: Four times a day (QID) | ORAL | Status: DC | PRN

## 2014-02-02 MED ORDER — TRIAMCINOLONE ACETONIDE 40 MG/ML IJ SUSP
40.0000 mg | Freq: Once | INTRAMUSCULAR | Status: AC
  Administered 2014-02-02: 40 mg via INTRAMUSCULAR

## 2014-02-02 MED ORDER — METHYLPREDNISOLONE ACETATE 80 MG/ML IJ SUSP
INTRAMUSCULAR | Status: AC
  Filled 2014-02-02: qty 1

## 2014-02-02 MED ORDER — TRIAMCINOLONE ACETONIDE 40 MG/ML IJ SUSP
INTRAMUSCULAR | Status: AC
  Filled 2014-02-02: qty 1

## 2014-02-02 NOTE — ED Provider Notes (Signed)
CSN: [id number]     Arrival date & time 02/02/14  1138 History   First MD Initiated Contact with Patient 02/02/14 1245     Chief Complaint  Patient presents with  . Rash   (Consider location/radiation/quality/duration/timing/severity/associated sxs/prior Treatment) Patient is a 63 y.o. female presenting with rash. The history is provided by the patient and a relative.  Rash Location:  Full body Quality: itchiness and swelling   Severity:  Mild Onset quality:  Sudden Duration:  2 days Progression:  Unchanged (initially felt like throat closing and choking., then rash developed.) Chronicity:  New Context: food and medications   Context comment:  5wks s/p surg, also ate oysters on fri, Associated symptoms: sore throat   Associated symptoms: no shortness of breath and not wheezing     Past Medical History  Diagnosis Date  . Osteopenia   . CTS (carpal tunnel syndrome)     chronic   Past Surgical History  Procedure Laterality Date  . Appendectomy    . Cholecystectomy    . Tubal ligation    . Rotator cuff surgery     Family History  Problem Relation Age of Onset  . Bipolar disorder Sister   . Schizophrenia Sister   . Kidney disease Sister   . Hypertension Sister   . Bipolar disorder Sister   . Kidney disease Sister   . Bipolar disorder Son   . Hypertension Mother   . Mental illness Mother   . Cancer Father 7    colon; bladder  . Hypertension Father   . Heart disease Father   . Arthritis Father    History  Substance Use Topics  . Smoking status: Former Smoker -- 1.00 packs/day for 10 years    Types: Cigarettes    Quit date: 11/01/1993  . Smokeless tobacco: Never Used  . Alcohol Use: No   OB History   Grav Para Term Preterm Abortions TAB SAB Ect Mult Living                 Review of Systems  Constitutional: Negative.   HENT: Positive for sore throat and trouble swallowing. Negative for postnasal drip and rhinorrhea.   Respiratory: Negative for shortness of  breath and wheezing.   Skin: Positive for rash.    Allergies  Codeine  Home Medications   Current Outpatient Rx  Name  Route  Sig  Dispense  Refill  . aspirin 81 MG tablet   Oral   Take 81 mg by mouth daily.         . traMADol (ULTRAM) 50 MG tablet   Oral   Take 50 mg by mouth every 6 (six) hours as needed.         . Calcium Carbonate-Vitamin D (CALTRATE 600+D PO)   Oral   Take by mouth daily.         . Cholecalciferol (VITAMIN D-3) 1000 UNITS CAPS   Oral   Take by mouth daily.         . citalopram (CELEXA) 20 MG tablet      Take 1 1/2 tablet po daily   45 tablet   5   . hydrOXYzine (ATARAX/VISTARIL) 50 MG tablet   Oral   Take 1 tablet (50 mg total) by mouth every 6 (six) hours as needed. As needed for rash.   30 tablet   0   . LORazepam (ATIVAN) 0.5 MG tablet      Take 1/2-1 tablet po TID prn   45  tablet   2     [facility]   . naproxen (NAPROSYN) 500 MG tablet   Oral   Take 1 tablet (500 mg total) by mouth 2 (two) times daily with a meal. Prn pain   30 tablet   2    BP 120/66  Pulse 111  Temp(Src) 99 F (37.2 C) (Oral)  Resp 18  SpO2 95% Physical Exam  Nursing note and vitals reviewed. Constitutional: She is oriented to person, place, and time. She appears well-developed and well-nourished. No distress.  HENT:  Head: Normocephalic and atraumatic.  Mouth/Throat: Oropharynx is clear and moist.  Neck: Normal range of motion. Neck supple.  Cardiovascular: Normal heart sounds.   Pulmonary/Chest: Effort normal and breath sounds normal. She has no wheezes.  Lymphadenopathy:    She has no cervical adenopathy.  Neurological: She is alert and oriented to person, place, and time.  Skin: Skin is warm and dry. Rash noted.  Diffuse scattered urticarial rash.    ED Course  Procedures (including critical care time) Labs Review Labs Reviewed - No data to display Imaging Review No results found.   MDM   1. Urticaria, idiopathic         Billy Fischer, MD 02/02/14 1324

## 2014-02-02 NOTE — ED Notes (Signed)
Patient c/o rash all over body x 2 days. Took two Benadryl yesterday with no relief. Pt reports at onset of symptoms she felt like her thraot was closing and like she was choking. Pt denies changes in diet, except she did eat Oysters on Friday. Pt is alert and oriented and in no acute distress.

## 2014-02-03 ENCOUNTER — Telehealth: Payer: Self-pay | Admitting: Family Medicine

## 2014-02-03 NOTE — Telephone Encounter (Signed)
Pt called to say that over the weekend she went to urgent care with a SEVERE allergic reaction to TRAMADOL.  She recently had surgery and the surgeon put her on it, the urgent care doc recommended that the pt contact us to make [place] aware of this.  Her eyes were swelling, hives, trouble swallowing.  Urgent care put her on Hydroxyzine Hcl 23mg  for the itching (per pt).  States it's better, just wants [place] to be aware of allergic reaction and to note in her chart.  Please note in her Allergies (pt has follow up visit here 02/14/14, told her to be sure to tell the nurse at that time and I would send a message too)

## 2014-02-03 NOTE — Telephone Encounter (Signed)
Added to patient's list of allergies.

## 2014-02-04 ENCOUNTER — Emergency Department (HOSPITAL_COMMUNITY)
Admission: EM | Admit: 2014-02-04 | Discharge: 2014-02-04 | Disposition: A | Discharge status: Home / Self Care | Payer: Federal, State, Local not specified - PPO | Source: Home / Self Care

## 2014-02-04 ENCOUNTER — Encounter (HOSPITAL_COMMUNITY): Payer: Self-pay | Admitting: Emergency Medicine

## 2014-02-04 DIAGNOSIS — L501 Idiopathic urticaria: Secondary | ICD-10-CM

## 2014-02-04 NOTE — Discharge Instructions (Signed)
Go directly to dermatologist

## 2014-02-04 NOTE — ED Notes (Signed)
Pt  Reports   Symptoms  Of      Possible  Allergic  Reaction   She  Was  Seen    sev  Days  Ago  For   A   Rash          And was placed  On   Vistaril     And  Told  To  Take  Tylenol  For  Pain        Her  Lips  Are  Swollen  And  She  Reportedly has  Some    Tightness  In throat

## 2014-02-04 NOTE — ED Provider Notes (Signed)
CSN: [id number]     Arrival date & time 02/04/14  6283 History   None    Chief Complaint  Patient presents with  . Rash   (Consider location/radiation/quality/duration/timing/severity/associated sxs/prior Treatment) Patient is a 63 y.o. female presenting with rash. The history is provided by the patient.  Rash Location:  Full body Quality: swelling   Severity:  Mild Onset quality:  Gradual Progression:  Spreading Chronicity:  New Context comment:  Seen 4/12 for same, not improving, in fact this am lips swollen. Associated symptoms: not wheezing     Past Medical History  Diagnosis Date  . Osteopenia   . CTS (carpal tunnel syndrome)     chronic   Past Surgical History  Procedure Laterality Date  . Appendectomy    . Cholecystectomy    . Tubal ligation    . Rotator cuff surgery     Family History  Problem Relation Age of Onset  . Bipolar disorder Sister   . Schizophrenia Sister   . Kidney disease Sister   . Hypertension Sister   . Bipolar disorder Sister   . Kidney disease Sister   . Bipolar disorder Son   . Hypertension Mother   . Mental illness Mother   . Cancer Father 15    colon; bladder  . Hypertension Father   . Heart disease Father   . Arthritis Father    History  Substance Use Topics  . Smoking status: Former Smoker -- 1.00 packs/day for 10 years    Types: Cigarettes    Quit date: 11/01/1993  . Smokeless tobacco: Never Used  . Alcohol Use: No   OB History   Grav Para Term Preterm Abortions TAB SAB Ect Mult Living                 Review of Systems  Constitutional: Negative.   HENT: Negative for trouble swallowing.   Respiratory: Negative for cough and wheezing.   Skin: Positive for rash.    Allergies  Codeine and Tramadol  Home Medications   Prior to Admission medications   Medication Sig Start Date End Date Taking? Authorizing Provider  aspirin 81 MG tablet Take 81 mg by mouth daily.    Historical Provider, MD  Calcium  Carbonate-Vitamin D (CALTRATE 600+D PO) Take by mouth daily.    Historical Provider, MD  Cholecalciferol (VITAMIN D-3) 1000 UNITS CAPS Take by mouth daily.    Historical Provider, MD  citalopram (CELEXA) 20 MG tablet Take 1 1/2 tablet po daily 10/04/13   Nilda Simmer, NP  hydrOXYzine (ATARAX/VISTARIL) 50 MG tablet Take 1 tablet (50 mg total) by mouth every 6 (six) hours as needed. As needed for rash. 02/02/14   Billy Fischer, MD  LORazepam (ATIVAN) 0.5 MG tablet Take 1/2-1 tablet po TID prn 10/04/13   Nilda Simmer, NP  naproxen (NAPROSYN) 500 MG tablet Take 1 tablet (500 mg total) by mouth 2 (two) times daily with a meal. Prn pain 04/25/13   Nilda Simmer, NP   BP 126/84  Pulse 72  Temp(Src) 98.6 F (37 C) (Oral)  Resp 20  SpO2 100% Physical Exam  Nursing note and vitals reviewed. Constitutional: She is oriented to person, place, and time. She appears well-developed and well-nourished.  HENT:  Mouth/Throat: Oropharynx is clear and moist.  Neck: Normal range of motion. Neck supple.  Cardiovascular: Normal heart sounds.   Pulmonary/Chest: Breath sounds normal. She has no wheezes.  Neurological: She is alert and oriented to person, place,  and time.  Skin: Skin is warm and dry.  Scattered pruritic urticaria and lower lip swelling.    ED Course  Procedures (including critical care time) Labs Review Labs Reviewed - No data to display  Results for orders placed in visit on 97/94/80  BASIC METABOLIC PANEL      Result Value Ref Range   Sodium 141  135 - 145 mEq/L   Potassium 4.2  3.5 - 5.3 mEq/L   Chloride 101  96 - 112 mEq/L   CO2 30  19 - 32 mEq/L   Glucose, Bld 106 (*) 70 - 99 mg/dL   BUN 11  6 - 23 mg/dL   Creat 0.76  0.50 - 1.10 mg/dL   Calcium 9.2  8.4 - 10.5 mg/dL  HEPATIC FUNCTION PANEL      Result Value Ref Range   Total Bilirubin 0.9  0.3 - 1.2 mg/dL   Bilirubin, Direct 0.2  0.0 - 0.3 mg/dL   Indirect Bilirubin 0.7  0.0 - 0.9 mg/dL   Alkaline Phosphatase 64   39 - 117 U/L   AST 17  0 - 37 U/L   ALT 12  0 - 35 U/L   Total Protein 6.3  6.0 - 8.3 g/dL   Albumin 4.1  3.5 - 5.2 g/dL  LIPID PANEL      Result Value Ref Range   Cholesterol 218 (*) 0 - 200 mg/dL   Triglycerides 69  <150 mg/dL   HDL 85  >39 mg/dL   Total CHOL/HDL Ratio 2.6     VLDL 14  0 - 40 mg/dL   LDL Cholesterol 119 (*) 0 - 99 mg/dL   Imaging Review No results found.   MDM   1. Urticaria, idiopathic   discussed with dr Allyson Sabal, will see directly in office.     Billy Fischer, MD 02/04/14 1110

## 2014-02-06 ENCOUNTER — Encounter: Payer: Self-pay | Admitting: Family Medicine

## 2014-02-06 ENCOUNTER — Ambulatory Visit (INDEPENDENT_AMBULATORY_CARE_PROVIDER_SITE_OTHER): Payer: Federal, State, Local not specified - PPO | Admitting: Family Medicine

## 2014-02-06 VITALS — BP 130/80 | Ht 64.5 in

## 2014-02-06 DIAGNOSIS — R21 Rash and other nonspecific skin eruption: Secondary | ICD-10-CM

## 2014-02-06 NOTE — Progress Notes (Signed)
   Subjective:    Patient ID: Lori [name], female    DOB: 06/13/1951, 63 y.o.   MRN: [id number]  HPI Patient was seen at [facility] on 02/04/14 for a rash and facial swelling. Patient states that she feels a lot better. Most of the rash and swelling is now gone. Patient states she has no other concerns at this time.   Sig swelling and bad rash, itchy in nature  Pill form,  pred pack, overall better now  Tramadol rxn--had a bx  One more month on the casting, dilaudid earlier  Took for seven days with the tramadol, patient feels rash and swelling definitely came from tramadol.  Review of Systems No headache no sore throat no chest pain no shortness breath ROS otherwise negative    Objective:   Physical Exam Alert no apparent distress. Lungs clear. Heart rare rhythm HET neck supple. Rash on. No further swelling.       Assessment & Plan:  Impression drug allergy reaction plan symptomatic care discussed. Finish up prescribed medication. Easily 15 minutes spent most in discussion. WSL

## 2014-02-14 ENCOUNTER — Encounter: Payer: Self-pay | Admitting: Nurse Practitioner

## 2014-02-14 ENCOUNTER — Ambulatory Visit (INDEPENDENT_AMBULATORY_CARE_PROVIDER_SITE_OTHER): Payer: Federal, State, Local not specified - PPO | Admitting: Nurse Practitioner

## 2014-02-14 VITALS — BP 124/80 | Ht 64.5 in

## 2014-02-14 DIAGNOSIS — G4700 Insomnia, unspecified: Secondary | ICD-10-CM

## 2014-02-14 DIAGNOSIS — F3289 Other specified depressive episodes: Secondary | ICD-10-CM

## 2014-02-14 DIAGNOSIS — F329 Major depressive disorder, single episode, unspecified: Secondary | ICD-10-CM

## 2014-02-14 DIAGNOSIS — F32A Depression, unspecified: Secondary | ICD-10-CM

## 2014-02-14 MED ORDER — LORAZEPAM 0.5 MG PO TABS: ORAL_TABLET | ORAL | Status: DC

## 2014-02-16 ENCOUNTER — Encounter: Payer: Self-pay | Admitting: Nurse Practitioner

## 2014-02-16 NOTE — Progress Notes (Signed)
Subjective:  Presents for followup of her depression and insomnia.taking a half to one Ativan at night for sleep. Has been released to mental health counseling. States she feels great. Has weaned herself down to 20 mg of Celexa, would like to discontinue.  Objective:   BP 124/80  Ht 5' 4.5" (1.638 m) NAD. Alert, oriented. Cheerful affect. Lungs clear. Heart regular rate rhythm.  Assessment:Insomnia  Depression  Plan:continue lorazepam as directed for sleep. Take half of 20 milligrams Celexa x 2 weeks then discontinue.explained to patient she may feel some mild serotonin withdrawal symptoms but these should resolve within a few days, call back if any problems. Given prescription for her shingles vaccine. Recheck here as needed.

## 2014-04-11 ENCOUNTER — Encounter: Payer: Self-pay | Admitting: Family Medicine

## 2014-04-11 ENCOUNTER — Ambulatory Visit (INDEPENDENT_AMBULATORY_CARE_PROVIDER_SITE_OTHER): Payer: Federal, State, Local not specified - PPO | Admitting: Family Medicine

## 2014-04-11 VITALS — BP 140/80 | Ht 64.5 in | Wt 171.0 lb

## 2014-04-11 DIAGNOSIS — M25529 Pain in unspecified elbow: Secondary | ICD-10-CM

## 2014-04-11 DIAGNOSIS — M25522 Pain in left elbow: Secondary | ICD-10-CM

## 2014-04-11 MED ORDER — NAPROXEN 500 MG PO TABS: 500.0000 mg | ORAL_TABLET | Freq: Two times a day (BID) | ORAL | Status: DC

## 2014-04-11 NOTE — Progress Notes (Signed)
   Subjective:    Patient ID: Lori [name], female    DOB: 1951/08/11, 63 y.o.   MRN: [id number]  Arm Pain  Incident onset: Sunday. The incident occurred at home. There was no injury mechanism (Got the Shingles vaccine on June 11). The pain is present in the upper left arm. The pain does not radiate. The pain is severe. The pain has been constant since the incident. The symptoms are aggravated by movement and lifting. She has tried ice, heat and acetaminophen for the symptoms. The treatment provided no relief.    Developed pain and swelling after the shingles injection  Had an injection  Still not sig better and now hurting  Very painful with extension  painful getting the arm in and out  Recalls no injury   Review of Systems No headache no chest pain no pain elsewhere no abdominal pain ROS otherwise negative    Objective:   Physical Exam  Alert no acute distress talkative. Lungs clear. Heart regular in rhythm. Left lateral superior on tender to deep palpation. Shingles vaccine site noted. With some direct inflammatory tenderness upon palpation      Assessment & Plan:  Impression inflammatory arm and shoulder pain post shingles vaccine. These are likely related discussed with patient plan anti-inflammatory medicine when necessary local measures discussed expect gradual resolution. WSL

## 2014-06-02 ENCOUNTER — Telehealth: Payer: Self-pay | Admitting: Internal Medicine

## 2014-06-02 NOTE — Telephone Encounter (Signed)
Pt had been referred by Dr Wolfgang Phoenix around January/February 2015 to have her TCS set up and she has been putting it off and is ready to get it scheduled now. You can reach her at [phone]

## 2014-06-04 NOTE — Telephone Encounter (Signed)
Pt wants RMR and is aware of OV on Friday at 0930

## 2014-06-04 NOTE — Telephone Encounter (Signed)
I spoke with Neil Crouch, PA and she said she did have adenomatous polyps. Last colonoscopy on 12/09/2008 by Dr. Gala Romney.  She will need an OV.  Pt got here before I could get this documented and Manuela Schwartz will be making an appt.

## 2014-06-06 ENCOUNTER — Ambulatory Visit (INDEPENDENT_AMBULATORY_CARE_PROVIDER_SITE_OTHER): Payer: Federal, State, Local not specified - PPO | Admitting: Internal Medicine

## 2014-06-06 ENCOUNTER — Other Ambulatory Visit: Payer: Self-pay | Admitting: Internal Medicine

## 2014-06-06 ENCOUNTER — Encounter: Payer: Self-pay | Admitting: Internal Medicine

## 2014-06-06 ENCOUNTER — Encounter (INDEPENDENT_AMBULATORY_CARE_PROVIDER_SITE_OTHER): Payer: Self-pay

## 2014-06-06 VITALS — BP 128/81 | HR 86 | Temp 98.5°F | Ht 65.0 in | Wt 174.0 lb

## 2014-06-06 DIAGNOSIS — Z800 Family history of malignant neoplasm of digestive organs: Secondary | ICD-10-CM

## 2014-06-06 DIAGNOSIS — Z860100 Personal history of colon polyps, unspecified: Secondary | ICD-10-CM

## 2014-06-06 HISTORY — DX: Personal history of colonic polyps: Z86.010

## 2014-06-06 HISTORY — DX: Personal history of colon polyps, unspecified: Z86.0100

## 2014-06-06 HISTORY — DX: Family history of malignant neoplasm of digestive organs: Z80.0

## 2014-06-06 MED ORDER — PEG-KCL-NACL-NASULF-NA ASC-C 100 G PO SOLR: 1.0000 | ORAL | Status: DC

## 2014-06-06 NOTE — Patient Instructions (Signed)
Schedule surveillance colonoscopy - history polyps  Split Movie Prep

## 2014-06-06 NOTE — Progress Notes (Signed)
Primary Care Physician:  Rubbie Battiest, MD Primary Gastroenterologist:  Dr. Gala Romney  Pre-Procedure History & Physical: HPI:  Lori [name] is a 63 y.o. female here for followup of colonic adenomas. She had colonic adenomas removed in 2010 and in the distant past by [facility]. She does have a positive family history of colon cancer in her father but at an advanced age. She tells me aside from a tendency towards constipation ( hard bowel movement daily to every other day) and possibly scant blood only when wiping on rare occasion, she is devoid of any lower GI tract symptoms. Since I Last saw this nice lady, she has lost her sister and her husband. She also she had to have surgery for a fractured great toe recently.    Past Medical History  Diagnosis Date  . Osteopenia   . CTS (carpal tunnel syndrome)     chronic  . Tubular adenoma     Past Surgical History  Procedure Laterality Date  . Appendectomy    . Cholecystectomy    . Tubal ligation    . Rotator cuff surgery    . Colonoscopy  12/09/2008    Dr.Gabbi Whetstone- normal rectum, polyp at the hepatic flexure bx= tubular adenoma. the remainder of the colonic mucosa and terminal ileum mucosa appeared normal.  . Colonoscopy  2006    Dr.Sam Manchester- colonic adenoma    Prior to Admission medications   Medication Sig Start Date End Date Taking? Authorizing Provider  Calcium Carbonate-Vitamin D (CALTRATE 600+D PO) Take by mouth daily.   Yes Historical Provider, MD  Cholecalciferol (VITAMIN D-3) 1000 UNITS CAPS Take by mouth daily.   Yes Historical Provider, MD  naproxen (NAPROSYN) 500 MG tablet Take 1 tablet (500 mg total) by mouth 2 (two) times daily with a meal. Prn pain 04/11/14  Yes Mikey Kirschner, MD  citalopram (CELEXA) 20 MG tablet Take 1 1/2 tablet po daily 10/04/13   Nilda Simmer, NP  loratadine (CLARITIN) 10 MG tablet Take 10 mg by mouth daily.    Historical Provider, MD  LORazepam (ATIVAN) 0.5 MG tablet Take 1/2-1 tablet po qhs prn  02/14/14   Nilda Simmer, NP  peg 3350 powder (MOVIPREP) 100 G SOLR Take 1 kit (200 g total) by mouth as directed. 06/06/14   Daneil Dolin, MD  predniSONE (DELTASONE) 5 MG tablet 5 mg.     Historical Provider, MD    Allergies as of 06/06/2014 - Review Complete 04/11/2014  Allergen Reaction Noted  . Codeine  11/08/2012  . Tramadol  02/03/2014    Family History  Problem Relation Age of Onset  . Bipolar disorder Sister   . Schizophrenia Sister   . Kidney disease Sister   . Hypertension Sister   . Bipolar disorder Sister   . Kidney disease Sister   . Bipolar disorder Son   . Hypertension Mother   . Mental illness Mother   . Cancer Father 55    colon; bladder  . Hypertension Father   . Heart disease Father   . Arthritis Father     History   Social History  . Marital Status: Widowed    Spouse Name: N/A    Number of Children: N/A  . Years of Education: N/A   Occupational History  . Not on file.   Social History Main Topics  . Smoking status: Former Smoker -- 1.00 packs/day for 10 years    Types: Cigarettes    Quit date: 11/01/1993  .  Smokeless tobacco: Never Used  . Alcohol Use: No  . Drug Use: No  . Sexual Activity: Not Currently    Birth Control/ Protection: Surgical   Other Topics Concern  . Not on file   Social History Narrative  . No narrative on file    Review of Systems: See HPI, otherwise negative ROS  Physical Exam: BP 128/81  Pulse 86  Temp(Src) 98.5 F (36.9 C) (Oral)  Ht _0  (1.651 m)  Wt 174 lb (78.926 kg)  BMI 28.96 kg/m2 General:   Alert,  Well-developed, well-nourished, pleasant and cooperative in NAD Skin:  Intact without significant lesions or rashes. Eyes:  Sclera clear, no icterus.   Conjunctiva pink. Ears:  Normal auditory acuity. Nose:  No deformity, discharge,  or lesions. Mouth:  No deformity or lesions. Neck:  Supple; no masses or thyromegaly. No significant cervical adenopathy. Lungs:  Clear throughout to  auscultation.   No wheezes, crackles, or rhonchi. No acute distress. Heart:  Regular rate and rhythm; no murmurs, clicks, rubs,  or gallops. Abdomen: Non-distended, normal bowel sounds.  Soft and nontender without appreciable mass or hepatosplenomegaly.  Pulses:  Normal pulses noted. Extremities:  Without clubbing or edema. Rectal deferred until time of colonoscopy  Impression:  Very pleasant 63 year old lady with a personal history of colon adenomas removed over multiple colonoscopies. She has a positive family history of colon cancer in a first-degree relative but at an advanced age. Aside from mild constipation and possibly some ano-rectal bleeding (on paper) from time to time, she is devoid of any GI symptoms.  She is due to have a surveillance colonoscopy at this time.  Recommendations:    Surveillance colonoscopy in the near future.The risks, benefits, limitations, alternatives and imponderables have been reviewed with the patient. Questions have been answered. All parties are agreeable.        Notice: This dictation was prepared with Dragon dictation along with smaller phrase technology. Any transcriptional errors that result from this process are unintentional and may not be corrected upon review.

## 2014-06-12 ENCOUNTER — Encounter (HOSPITAL_COMMUNITY): Payer: Self-pay | Admitting: Pharmacy Technician

## 2014-06-18 ENCOUNTER — Telehealth: Payer: Self-pay | Admitting: Internal Medicine

## 2014-06-18 NOTE — Telephone Encounter (Signed)
Lori [name] called letting us know that her Dr. Has put her on 6 days of Prednisone and would it affect her TCS preperations and per Orvil Feil, NP is will not and Lori [name] is aware

## 2014-06-25 ENCOUNTER — Ambulatory Visit (HOSPITAL_COMMUNITY)
Admission: RE | Admit: 2014-06-25 | Discharge: 2014-06-25 | Disposition: A | Discharge status: Home / Self Care | Payer: Federal, State, Local not specified - PPO | Source: Ambulatory Visit | Attending: Internal Medicine | Admitting: Internal Medicine

## 2014-06-25 ENCOUNTER — Encounter (HOSPITAL_COMMUNITY): Payer: Self-pay | Admitting: *Deleted

## 2014-06-25 ENCOUNTER — Encounter (HOSPITAL_COMMUNITY)
Admission: RE | Disposition: A | Discharge status: Home / Self Care | Payer: Self-pay | Source: Ambulatory Visit | Attending: Internal Medicine

## 2014-06-25 DIAGNOSIS — D126 Benign neoplasm of colon, unspecified: Secondary | ICD-10-CM | POA: Diagnosis not present

## 2014-06-25 DIAGNOSIS — K5730 Diverticulosis of large intestine without perforation or abscess without bleeding: Secondary | ICD-10-CM | POA: Insufficient documentation

## 2014-06-25 DIAGNOSIS — M899 Disorder of bone, unspecified: Secondary | ICD-10-CM | POA: Diagnosis not present

## 2014-06-25 DIAGNOSIS — Z86010 Personal history of colonic polyps: Secondary | ICD-10-CM

## 2014-06-25 DIAGNOSIS — Z1211 Encounter for screening for malignant neoplasm of colon: Secondary | ICD-10-CM | POA: Diagnosis present

## 2014-06-25 DIAGNOSIS — Z800 Family history of malignant neoplasm of digestive organs: Secondary | ICD-10-CM | POA: Insufficient documentation

## 2014-06-25 DIAGNOSIS — M949 Disorder of cartilage, unspecified: Secondary | ICD-10-CM

## 2014-06-25 HISTORY — DX: Other specified postprocedural states: Z98.890

## 2014-06-25 HISTORY — DX: Nausea with vomiting, unspecified: Z98.890

## 2014-06-25 HISTORY — DX: Nausea with vomiting, unspecified: R11.2

## 2014-06-25 HISTORY — PX: COLONOSCOPY: SHX5424

## 2014-06-25 SURGERY — COLONOSCOPY
Anesthesia: Moderate Sedation

## 2014-06-25 MED ORDER — ONDANSETRON HCL 4 MG/2ML IJ SOLN
INTRAMUSCULAR | Status: DC | PRN
  Administered 2014-06-25: 4 mg via INTRAVENOUS

## 2014-06-25 MED ORDER — STERILE WATER FOR IRRIGATION IR SOLN
Status: DC | PRN
  Administered 2014-06-25: 10:00:00

## 2014-06-25 MED ORDER — MIDAZOLAM HCL 5 MG/5ML IJ SOLN
INTRAMUSCULAR | Status: DC | PRN
  Administered 2014-06-25: 2 mg via INTRAVENOUS
  Administered 2014-06-25: 1 mg via INTRAVENOUS
  Administered 2014-06-25: 2 mg via INTRAVENOUS

## 2014-06-25 MED ORDER — MEPERIDINE HCL 100 MG/ML IJ SOLN
INTRAMUSCULAR | Status: AC
  Filled 2014-06-25: qty 2

## 2014-06-25 MED ORDER — ONDANSETRON HCL 4 MG/2ML IJ SOLN
INTRAMUSCULAR | Status: AC
  Filled 2014-06-25: qty 2

## 2014-06-25 MED ORDER — MEPERIDINE HCL 100 MG/ML IJ SOLN
INTRAMUSCULAR | Status: DC | PRN
  Administered 2014-06-25: 50 mg via INTRAVENOUS
  Administered 2014-06-25: 25 mg via INTRAVENOUS

## 2014-06-25 MED ORDER — SODIUM CHLORIDE 0.9 % IV SOLN
INTRAVENOUS | Status: DC
  Administered 2014-06-25: 10:00:00 via INTRAVENOUS

## 2014-06-25 MED ORDER — MIDAZOLAM HCL 5 MG/5ML IJ SOLN
INTRAMUSCULAR | Status: AC
  Filled 2014-06-25: qty 10

## 2014-06-25 NOTE — Interval H&P Note (Signed)
History and Physical Interval Note:  06/25/2014 10:08 AM  Lori [name]  has presented today for surgery, with the diagnosis of HISTORY OF COLON POLYPS  The various methods of treatment have been discussed with the patient and family. After consideration of risks, benefits and other options for treatment, the patient has consented to  Procedure(s) with comments: COLONOSCOPY (N/A) - 1030 DR REQUEST TIME as a surgical intervention .  The patient's history has been reviewed, patient examined, no change in status, stable for surgery.  I have reviewed the patient's chart and labs.  Questions were answered to the patient's satisfaction.   No change. Colonoscopy per plan.The risks, benefits, limitations, alternatives and imponderables have been reviewed with the patient. Questions have been answered. All parties are agreeable.   Manus Rudd

## 2014-06-25 NOTE — Discharge Instructions (Signed)
Colonoscopy Discharge Instructions  Read the instructions outlined below and refer to this sheet in the next few weeks. These discharge instructions provide you with general information on caring for yourself after you leave the hospital. Your doctor may also give you specific instructions. While your treatment has been planned according to the most current medical practices available, unavoidable complications occasionally occur. If you have any problems or questions after discharge, call Dr. Gala [name] at [phone]. ACTIVITY  You may resume your regular activity, but move at a slower pace for the next 24 hours.   Take frequent rest periods for the next 24 hours.   Walking will help get rid of the air and reduce the bloated feeling in your belly (abdomen).   No driving for 24 hours (because of the medicine (anesthesia) used during the test).    Do not sign any important legal documents or operate any machinery for 24 hours (because of the anesthesia used during the test).  NUTRITION  Drink plenty of fluids.   You may resume your normal diet as instructed by your doctor.   Begin with a light meal and progress to your normal diet. Heavy or fried foods are harder to digest and may make you feel sick to your stomach (nauseated).   Avoid alcoholic beverages for 24 hours or as instructed.  MEDICATIONS  You may resume your normal medications unless your doctor tells you otherwise.  WHAT YOU CAN EXPECT TODAY  Some feelings of bloating in the abdomen.   Passage of more gas than usual.   Spotting of blood in your stool or on the toilet paper.  IF YOU HAD POLYPS REMOVED DURING THE COLONOSCOPY:  No aspirin products for 7 days or as instructed.   No alcohol for 7 days or as instructed.   Eat a soft diet for the next 24 hours.  FINDING OUT THE RESULTS OF YOUR TEST Not all test results are available during your visit. If your test results are not back during the visit, make an appointment  with your caregiver to find out the results. Do not assume everything is normal if you have not heard from your caregiver or the medical facility. It is important for you to follow up on all of your test results.  SEEK IMMEDIATE MEDICAL ATTENTION IF:  You have more than a spotting of blood in your stool.   Your belly is swollen (abdominal distention).   You are nauseated or vomiting.   You have a temperature over 101.   You have abdominal pain or discomfort that is severe or gets worse throughout the day.   Diverticulosis Diverticulosis is the condition that develops when small pouches (diverticula) form in the wall of your colon. Your colon, or large intestine, is where water is absorbed and stool is formed. The pouches form when the inside layer of your colon pushes through weak spots in the outer layers of your colon. CAUSES  No one knows exactly what causes diverticulosis. RISK FACTORS  Being older than 60. Your risk for this condition increases with age. Diverticulosis is rare in people younger than 40 years. By age 83, almost everyone has it.  Eating a low-fiber diet.  Being frequently constipated.  Being overweight.  Not getting enough exercise.  Smoking.  Taking over-the-counter pain medicines, like aspirin and ibuprofen. SYMPTOMS  Most people with diverticulosis do not have symptoms. DIAGNOSIS  Because diverticulosis often has no symptoms, health care providers often discover the condition during an exam for other  colon problems. In many cases, a health care provider will diagnose diverticulosis while using a flexible scope to examine the colon (colonoscopy). TREATMENT  If you have never developed an infection related to diverticulosis, you may not need treatment. If you have had an infection before, treatment may include:  Eating more fruits, vegetables, and grains.  Taking a fiber supplement.  Taking a live bacteria supplement (probiotic).  Taking medicine to  relax your colon. HOME CARE INSTRUCTIONS   Drink at least 6-8 glasses of water each day to prevent constipation.  Try not to strain when you have a bowel movement.  Keep all follow-up appointments. If you have had an infection before:  Increase the fiber in your diet as directed by your health care provider or dietitian.  Take a dietary fiber supplement if your health care provider approves.  Only take medicines as directed by your health care provider. SEEK MEDICAL CARE IF:   You have abdominal pain.  You have bloating.  You have cramps.  You have not gone to the bathroom in 3 days. SEEK IMMEDIATE MEDICAL CARE IF:   Your pain gets worse.  Yourbloating becomes very bad.  You have a fever or chills, and your symptoms suddenly get worse.  You begin vomiting.  You have bowel movements that are bloody or black. MAKE SURE YOU:  Understand these instructions.  Will watch your condition.  Will get help right away if you are not doing well or get worse. Document Released: 07/07/2004 Document Revised: 10/15/2013 Document Reviewed: 09/04/2013 [facility] Patient Information 2015 [place], [place]. This information is not intended to replace advice given to you by your health care provider. Make sure you discuss any questions you have with your health care provider. Colon Polyps Polyps are lumps of extra tissue growing inside the body. Polyps can grow in the large intestine (colon). Most colon polyps are noncancerous (benign). However, some colon polyps can become cancerous over time. Polyps that are larger than a pea may be harmful. To be safe, caregivers remove and test all polyps. CAUSES  Polyps form when mutations in the genes cause your cells to grow and divide even though no more tissue is needed. RISK FACTORS There are a number of risk factors that can increase your chances of getting colon polyps. They include:  Being older than 50 years.  Family history of colon polyps  or colon cancer.  Long-term colon diseases, such as colitis or Crohn disease.  Being overweight.  Smoking.  Being inactive.  Drinking too much alcohol. SYMPTOMS  Most small polyps do not cause symptoms. If symptoms are present, they may include:  Blood in the stool. The stool may look dark red or black.  Constipation or diarrhea that lasts longer than 1 week. DIAGNOSIS People often do not know they have polyps until their caregiver finds them during a regular checkup. Your caregiver can use 4 tests to check for polyps:  Digital rectal exam. The caregiver wears gloves and feels inside the rectum. This test would find polyps only in the rectum.  Barium enema. The caregiver puts a liquid called barium into your rectum before taking X-rays of your colon. Barium makes your colon look white. Polyps are dark, so they are easy to see in the X-ray pictures.  Sigmoidoscopy. A thin, flexible tube (sigmoidoscope) is placed into your rectum. The sigmoidoscope has a light and tiny camera in it. The caregiver uses the sigmoidoscope to look at the last third of your colon.  Colonoscopy. This  test is like sigmoidoscopy, but the caregiver looks at the entire colon. This is the most common method for finding and removing polyps. TREATMENT  Any polyps will be removed during a sigmoidoscopy or colonoscopy. The polyps are then tested for cancer. PREVENTION  To help lower your risk of getting more colon polyps:  Eat plenty of fruits and vegetables. Avoid eating fatty foods.  Do not smoke.  Avoid drinking alcohol.  Exercise every day.  Lose weight if recommended by your caregiver.  Eat plenty of calcium and folate. Foods that are rich in calcium include milk, cheese, and broccoli. Foods that are rich in folate include chickpeas, kidney beans, and spinach. HOME CARE INSTRUCTIONS Keep all follow-up appointments as directed by your caregiver. You may need periodic exams to check for polyps. SEEK  MEDICAL CARE IF: You notice bleeding during a bowel movement. Document Released: 07/06/2004 Document Revised: 01/02/2012 Document Reviewed: 12/20/2011 [facility] Patient Information 2015 [place], [place]. This information is not intended to replace advice given to you by your health care provider. Make sure you discuss any questions you have with your health care provider.   Diverticulosis and polyp information provided  Further recommendations to follow pending review of pathology report

## 2014-06-25 NOTE — Op Note (Signed)
[facility] [address] [place], [address]   COLONOSCOPY PROCEDURE REPORT  PATIENT: Lori [name], Lori [name]  MR#:         [id number] BIRTHDATE: 08-22-1951 , 63  yrs. old GENDER: Female ENDOSCOPIST: R.  Garfield Cornea, MD FACP FACG REFERRED BY:  Sallee Lange, M.D. PROCEDURE DATE:  06/25/2014 PROCEDURE:     Ileocolonoscopy with snare polypectomy and biopsy  INDICATIONS: history of colonic adenoma and positive family history of colon cancer  INFORMED CONSENT:  The risks, benefits, alternatives and imponderables including but not limited to bleeding, perforation as well as the possibility of a missed lesion have been reviewed.  The potential for biopsy, lesion removal, etc. have also been discussed.  Questions have been answered.  All parties agreeable. Please see the history and physical in the medical record for more information.  MEDICATIONS: Versed 5 mg IV and Demerol 75 mg IV in divided doses. Zofran 4 mg IV  DESCRIPTION OF PROCEDURE:  After a digital rectal exam was performed, the EC-3890Li ([id number])  colonoscope was advanced from the anus through the rectum and colon to the area of the cecum, ileocecal valve and appendiceal orifice.  The cecum was deeply intubated.  These structures were well-seen and photographed for the record.  From the level of the cecum and ileocecal valve, the scope was slowly and cautiously withdrawn.  The mucosal surfaces were carefully surveyed utilizing scope tip deflection to facilitate fold flattening as needed.  The scope was pulled down into the rectum where a thorough examination including retroflexion was performed.    FINDINGS:  Adequate preparation. Normal rectum. Scattered left-sided diverticula; (2) 5 mm polyps at the hepatic flexure and (1) diminutive polyp in the mid descending segment; otherwise, the remainder of the colonic mucosa appeared normal. The distal 5 cm of terminal ileum mucosa also appeared  normal.  THERAPEUTIC / DIAGNOSTIC MANEUVERS PERFORMED:  The 2 hepatic flexure polyps were cold snare removed. The diminutive descending colon polyp was cold biopsy removed.  COMPLICATIONS: none  CECAL WITHDRAWAL TIME:  11 minutes  IMPRESSION:  Colonic diverticulosis. Multiple colonic polyps-removed as described above.  RECOMMENDATIONS: Followup on pathology.   _______________________________ eSigned:  R. Garfield Cornea, MD FACP [facility] 06/25/2014 10:40 AM   CC:

## 2014-06-25 NOTE — H&P (View-Only) (Signed)
Primary Care Physician:  Rubbie Battiest, MD Primary Gastroenterologist:  Dr. Gala Romney  Pre-Procedure History & Physical: HPI:  Lori [name] is a 63 y.o. female here for followup of colonic adenomas. She had colonic adenomas removed in 2010 and in the distant past by [facility]. She does have a positive family history of colon cancer in her father but at an advanced age. She tells me aside from a tendency towards constipation ( hard bowel movement daily to every other day) and possibly scant blood only when wiping on rare occasion, she is devoid of any lower GI tract symptoms. Since I Last saw this nice lady, she has lost her sister and her husband. She also she had to have surgery for a fractured great toe recently.    Past Medical History  Diagnosis Date  . Osteopenia   . CTS (carpal tunnel syndrome)     chronic  . Tubular adenoma     Past Surgical History  Procedure Laterality Date  . Appendectomy    . Cholecystectomy    . Tubal ligation    . Rotator cuff surgery    . Colonoscopy  12/09/2008    Dr.Katharin Schneider- normal rectum, polyp at the hepatic flexure bx= tubular adenoma. the remainder of the colonic mucosa and terminal ileum mucosa appeared normal.  . Colonoscopy  2006    Dr.Sam - colonic adenoma    Prior to Admission medications   Medication Sig Start Date End Date Taking? Authorizing Provider  Calcium Carbonate-Vitamin D (CALTRATE 600+D PO) Take by mouth daily.   Yes Historical Provider, MD  Cholecalciferol (VITAMIN D-3) 1000 UNITS CAPS Take by mouth daily.   Yes Historical Provider, MD  naproxen (NAPROSYN) 500 MG tablet Take 1 tablet (500 mg total) by mouth 2 (two) times daily with a meal. Prn pain 04/11/14  Yes Mikey Kirschner, MD  citalopram (CELEXA) 20 MG tablet Take 1 1/2 tablet po daily 10/04/13   Nilda Simmer, NP  loratadine (CLARITIN) 10 MG tablet Take 10 mg by mouth daily.    Historical Provider, MD  LORazepam (ATIVAN) 0.5 MG tablet Take 1/2-1 tablet po qhs prn  02/14/14   Nilda Simmer, NP  peg 3350 powder (MOVIPREP) 100 G SOLR Take 1 kit (200 g total) by mouth as directed. 06/06/14   Daneil Dolin, MD  predniSONE (DELTASONE) 5 MG tablet 5 mg.     Historical Provider, MD    Allergies as of 06/06/2014 - Review Complete 04/11/2014  Allergen Reaction Noted  . Codeine  11/08/2012  . Tramadol  02/03/2014    Family History  Problem Relation Age of Onset  . Bipolar disorder Sister   . Schizophrenia Sister   . Kidney disease Sister   . Hypertension Sister   . Bipolar disorder Sister   . Kidney disease Sister   . Bipolar disorder Son   . Hypertension Mother   . Mental illness Mother   . Cancer Father 58    colon; bladder  . Hypertension Father   . Heart disease Father   . Arthritis Father     History   Social History  . Marital Status: Widowed    Spouse Name: N/A    Number of Children: N/A  . Years of Education: N/A   Occupational History  . Not on file.   Social History Main Topics  . Smoking status: Former Smoker -- 1.00 packs/day for 10 years    Types: Cigarettes    Quit date: 11/01/1993  .  Smokeless tobacco: Never Used  . Alcohol Use: No  . Drug Use: No  . Sexual Activity: Not Currently    Birth Control/ Protection: Surgical   Other Topics Concern  . Not on file   Social History Narrative  . No narrative on file    Review of Systems: See HPI, otherwise negative ROS  Physical Exam: BP 128/81  Pulse 86  Temp(Src) 98.5 F (36.9 C) (Oral)  Ht _0  (1.651 m)  Wt 174 lb (78.926 kg)  BMI 28.96 kg/m2 General:   Alert,  Well-developed, well-nourished, pleasant and cooperative in NAD Skin:  Intact without significant lesions or rashes. Eyes:  Sclera clear, no icterus.   Conjunctiva pink. Ears:  Normal auditory acuity. Nose:  No deformity, discharge,  or lesions. Mouth:  No deformity or lesions. Neck:  Supple; no masses or thyromegaly. No significant cervical adenopathy. Lungs:  Clear throughout to  auscultation.   No wheezes, crackles, or rhonchi. No acute distress. Heart:  Regular rate and rhythm; no murmurs, clicks, rubs,  or gallops. Abdomen: Non-distended, normal bowel sounds.  Soft and nontender without appreciable mass or hepatosplenomegaly.  Pulses:  Normal pulses noted. Extremities:  Without clubbing or edema. Rectal deferred until time of colonoscopy  Impression:  Very pleasant 63 year old lady with a personal history of colon adenomas removed over multiple colonoscopies. She has a positive family history of colon cancer in a first-degree relative but at an advanced age. Aside from mild constipation and possibly some ano-rectal bleeding (on paper) from time to time, she is devoid of any GI symptoms.  She is due to have a surveillance colonoscopy at this time.  Recommendations:    Surveillance colonoscopy in the near future.The risks, benefits, limitations, alternatives and imponderables have been reviewed with the patient. Questions have been answered. All parties are agreeable.        Notice: This dictation was prepared with Dragon dictation along with smaller phrase technology. Any transcriptional errors that result from this process are unintentional and may not be corrected upon review.

## 2014-06-27 ENCOUNTER — Encounter: Payer: Self-pay | Admitting: Internal Medicine

## 2014-07-01 ENCOUNTER — Encounter (HOSPITAL_COMMUNITY): Payer: Self-pay | Admitting: Internal Medicine

## 2014-12-24 ENCOUNTER — Ambulatory Visit (INDEPENDENT_AMBULATORY_CARE_PROVIDER_SITE_OTHER): Payer: Federal, State, Local not specified - PPO | Admitting: Family Medicine

## 2014-12-24 ENCOUNTER — Encounter: Payer: Self-pay | Admitting: Family Medicine

## 2014-12-24 VITALS — BP 140/82 | Ht 64.5 in | Wt 172.0 lb

## 2014-12-24 DIAGNOSIS — R05 Cough: Secondary | ICD-10-CM

## 2014-12-24 DIAGNOSIS — R059 Cough, unspecified: Secondary | ICD-10-CM

## 2014-12-24 DIAGNOSIS — R053 Chronic cough: Secondary | ICD-10-CM

## 2014-12-24 MED ORDER — FLUTICASONE PROPIONATE 50 MCG/ACT NA SUSP: 2.0000 | Freq: Every day | NASAL | Status: DC

## 2014-12-24 MED ORDER — LORATADINE 10 MG PO TABS: 10.0000 mg | ORAL_TABLET | Freq: Every day | ORAL | Status: DC

## 2014-12-24 NOTE — Patient Instructions (Signed)
If cough persist another month call us and we will set you up with pulmonary doctor

## 2014-12-24 NOTE — Progress Notes (Signed)
   Subjective:    Patient ID: Lori [name], female    DOB: 01/02/1951, 64 y.o.   MRN: [id number]  HPI Patient arrives with complaint of cough that comes and goes for 3 months.  Chronic for a few months comes and goes  Nonproductive cough  Wheezy  Walking does not trigger a cough  Will notice with drainage and when swallowing, feels strangled   Patient states the cough sounds barky like a seal.  Patient does have a history of substantial smoking. None for quite a few years. Greater than 10. Patient  Patient claims she has no issues with reflux or heartburn.  Review of Systems No headache no chest pain no back pain no weight loss no change in bowel habits alert alert no apparent    Objective:   Physical Exam Alert no apparent distress H&T normal neck supple. Lungs clear heart regular in rhythm.       Assessment & Plan:  Impression chronic cough long discussion held plan trial of Flonase 2 sprays each nostril daily plus Claritin daily. Patient also to get chest x-ray. Patient to call us in several weeks if persists we will set up with a pulmonary referral. 25 minutes spent most in discussion. WSL

## 2014-12-26 ENCOUNTER — Ambulatory Visit (HOSPITAL_COMMUNITY)
Admission: RE | Admit: 2014-12-26 | Discharge: 2014-12-26 | Disposition: A | Discharge status: Home / Self Care | Payer: Federal, State, Local not specified - PPO | Source: Ambulatory Visit | Attending: Family Medicine | Admitting: Family Medicine

## 2014-12-26 DIAGNOSIS — Z87891 Personal history of nicotine dependence: Secondary | ICD-10-CM | POA: Insufficient documentation

## 2014-12-26 DIAGNOSIS — R05 Cough: Secondary | ICD-10-CM | POA: Insufficient documentation

## 2015-02-11 ENCOUNTER — Telehealth: Payer: Self-pay | Admitting: Nurse Practitioner

## 2015-02-11 NOTE — Telephone Encounter (Signed)
Pt came by wanting to make an appt with Hoyle Sauer. Pt has a rash in her vaginal  Area that seems to be getting worse. Pt would like for Hoyle Sauer to see it while it is Full blown. There wasn't an appt that I could put her in till the first week of May.

## 2015-02-17 NOTE — Telephone Encounter (Signed)
If she still needs appt. Please put her in same day slot for this week. Thanks.

## 2015-02-17 NOTE — Telephone Encounter (Signed)
Put her in a same day slot for Weds.

## 2015-02-18 ENCOUNTER — Ambulatory Visit (INDEPENDENT_AMBULATORY_CARE_PROVIDER_SITE_OTHER): Payer: Federal, State, Local not specified - PPO | Admitting: Nurse Practitioner

## 2015-02-18 ENCOUNTER — Encounter: Payer: Self-pay | Admitting: Nurse Practitioner

## 2015-02-18 VITALS — BP 136/86 | Ht 64.5 in | Wt 172.8 lb

## 2015-02-18 DIAGNOSIS — L723 Sebaceous cyst: Secondary | ICD-10-CM | POA: Diagnosis not present

## 2015-02-18 DIAGNOSIS — N907 Vulvar cyst: Secondary | ICD-10-CM

## 2015-02-18 DIAGNOSIS — N94819 Vulvodynia, unspecified: Secondary | ICD-10-CM | POA: Diagnosis not present

## 2015-02-18 MED ORDER — TRIAMCINOLONE ACETONIDE 0.1 % EX CREA: 1.0000 "application " | TOPICAL_CREAM | Freq: Two times a day (BID) | CUTANEOUS | Status: DC

## 2015-02-18 MED ORDER — DOXYCYCLINE HYCLATE 100 MG PO TABS: 100.0000 mg | ORAL_TABLET | Freq: Two times a day (BID) | ORAL | Status: DC

## 2015-02-20 ENCOUNTER — Encounter: Payer: Self-pay | Admitting: Nurse Practitioner

## 2015-02-20 NOTE — Progress Notes (Signed)
Subjective:  Presents with complaints of recurrent "knots" outside her vagina. No blisters or rash. Had one area 2 weeks ago which was tender and erythematous. This has resolved. Has off-and-on small areas in the labia, nontender. Also some itching and occasional burning on the outside the vagina. No discharge. No pelvic pain. No fever. No new sexual partners. No urinary symptoms.  Objective:   BP 136/86 mmHg  Ht 5' 4.5" (1.638 m)  Wt 172 lb 12.8 oz (78.382 kg)  BMI 29.21 kg/m2 NAD. Alert, oriented. External GU no rash or erythema. One small superficial white nodule in the mid left labia consistent with a sebaceous cyst. No evidence of infection at this time. No vaginal discharge.  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 triamcinolone cream (KENALOG) 0.1 %    Sig: Apply 1 application topically 2 (two) times daily. Prn rash; use up to 2 weeks    Dispense:  30 g    Refill:  0    Order Specific Question:  Supervising Provider    Answer:  Mikey Kirschner [[id number]]  . doxycycline (VIBRA-TABS) 100 MG tablet    Sig: Take 1 tablet (100 mg total) by mouth 2 (two) times daily.    Dispense:  14 tablet    Refill:  0    Order Specific Question:  Supervising Provider    Answer:  Mikey Kirschner [[id number]]   Use triamcinolone cream sparingly no more than 2 weeks. If labial symptoms persist, recommend other medication for vulvodynia. Use doxycycline if any evidence of infection. Recheck if symptoms worsen or persist. Strongly recommend preventive health physical.

## 2015-04-24 ENCOUNTER — Ambulatory Visit (INDEPENDENT_AMBULATORY_CARE_PROVIDER_SITE_OTHER): Payer: Federal, State, Local not specified - PPO | Admitting: Family Medicine

## 2015-04-24 ENCOUNTER — Encounter: Payer: Self-pay | Admitting: Family Medicine

## 2015-04-24 VITALS — BP 110/80 | Temp 98.5°F | Ht 64.5 in | Wt 170.4 lb

## 2015-04-24 DIAGNOSIS — R109 Unspecified abdominal pain: Secondary | ICD-10-CM | POA: Diagnosis not present

## 2015-04-24 LAB — POCT URINALYSIS DIPSTICK
Blood, UA: NEGATIVE
Glucose, UA: NEGATIVE
KETONES UA: NEGATIVE
NITRITE UA: NEGATIVE
PH UA: 7
PROTEIN UA: NEGATIVE
Spec Grav, UA: 1.015

## 2015-04-24 MED ORDER — CHLORZOXAZONE 500 MG PO TABS: 500.0000 mg | ORAL_TABLET | Freq: Three times a day (TID) | ORAL | Status: DC | PRN

## 2015-04-24 MED ORDER — DICLOFENAC SODIUM 75 MG PO TBEC: 75.0000 mg | DELAYED_RELEASE_TABLET | Freq: Two times a day (BID) | ORAL | Status: DC

## 2015-04-24 NOTE — Progress Notes (Signed)
   Subjective:    Patient ID: Lori [name], female    DOB: 1951/07/04, 64 y.o.   MRN: [id number]  Flank Pain This is a new problem. The current episode started 1 to 4 weeks ago. The problem occurs constantly. The problem is unchanged. Pain location: Left side, lower back. The pain does not radiate. The pain is the same all the time. The symptoms are aggravated by bending, standing, twisting and position. She has tried NSAIDs (Aleve, Advil) for the symptoms. The treatment provided no relief.   Patient states no other concerns this visit.  Left sided pain, one month's duration.  No change in urinary habits. No change in bowel habits.  Up-to-date on colonoscopy.  Recalls no injury.  Patient means well but she can be a very challenging historian. After approximately 8 questions it became clear that the pain is definitely worse with movement motion turning etc.     Review of Systems  Genitourinary: Positive for flank pain.   no headache no chest pain no back pain no radiation into legs     Objective:   Physical Exam Alert vitals stable. Lungs clear. Heart regular rate and rhythm. Spine nontender. No true CVA tenderness. Abdominal exam done with chaperone. Negative. Left lateral pain. Hip good range of motion.       Assessment & Plan:  Impression 1 lateral abdominal wall muscle strain subacute discussed at great length. Many questions answered plan no further diagnostic tests warranted at this time. Trial of a noninflammatory and muscle spasm major. Regular exercise encourage. 25 minutes spent most in discussion WSL

## 2015-05-05 ENCOUNTER — Ambulatory Visit: Payer: Federal, State, Local not specified - PPO | Admitting: Family Medicine

## 2015-05-12 ENCOUNTER — Telehealth: Payer: Self-pay | Admitting: Family Medicine

## 2015-05-12 MED ORDER — NAPROXEN 500 MG PO TABS: 500.0000 mg | ORAL_TABLET | Freq: Two times a day (BID) | ORAL | Status: DC

## 2015-05-12 NOTE — Telephone Encounter (Signed)
Verbally discussed patient original phone message about needing a new pain medication for pain with Dr.Scott. Dr.Scott gave verbal orders for Naprosyn 500 MG 1 tablet BID #28 tablets and to keep upcoming appointment next week on 05/21/15 and to call back if symptoms worsen. Informed patient Dr.Scott orders and patient verbalized understanding.

## 2015-05-12 NOTE — Telephone Encounter (Signed)
Pt called stating that the medicine she was given for ten days for muscle spasms worked while she was taking them but as soon as the medicine ran out the pain came back. Pt is wanting to know what else can be done for the pain.

## 2015-05-21 ENCOUNTER — Encounter: Payer: Self-pay | Admitting: Nurse Practitioner

## 2015-05-21 ENCOUNTER — Ambulatory Visit (INDEPENDENT_AMBULATORY_CARE_PROVIDER_SITE_OTHER): Payer: Federal, State, Local not specified - PPO | Admitting: Nurse Practitioner

## 2015-05-21 VITALS — BP 116/80 | Temp 99.1°F | Ht 64.5 in | Wt 168.0 lb

## 2015-05-21 DIAGNOSIS — M62830 Muscle spasm of back: Secondary | ICD-10-CM

## 2015-05-21 DIAGNOSIS — M5442 Lumbago with sciatica, left side: Secondary | ICD-10-CM

## 2015-05-21 DIAGNOSIS — R7309 Other abnormal glucose: Secondary | ICD-10-CM

## 2015-05-21 DIAGNOSIS — Z01419 Encounter for gynecological examination (general) (routine) without abnormal findings: Secondary | ICD-10-CM

## 2015-05-21 DIAGNOSIS — Z0000 Encounter for general adult medical examination without abnormal findings: Secondary | ICD-10-CM | POA: Diagnosis not present

## 2015-05-21 DIAGNOSIS — R7301 Impaired fasting glucose: Secondary | ICD-10-CM

## 2015-05-21 LAB — POCT GLYCOSYLATED HEMOGLOBIN (HGB A1C): Hemoglobin A1C: 5.1

## 2015-05-21 MED ORDER — DICLOFENAC SODIUM 75 MG PO TBEC: 75.0000 mg | DELAYED_RELEASE_TABLET | Freq: Two times a day (BID) | ORAL | Status: DC

## 2015-05-21 MED ORDER — CHLORZOXAZONE 500 MG PO TABS: 500.0000 mg | ORAL_TABLET | Freq: Three times a day (TID) | ORAL | Status: DC | PRN

## 2015-05-21 NOTE — Patient Instructions (Addendum)
Ice or heat applications Stretching and back exercises OTC TENS unit (Aleve or Icy Hot Smart Relief)    Back Exercises Back exercises help treat and prevent back injuries. The goal of back exercises is to increase the strength of your abdominal and back muscles and the flexibility of your back. These exercises should be started when you no longer have back pain. Back exercises include:  Pelvic Tilt. Lie on your back with your knees bent. Tilt your pelvis until the lower part of your back is against the floor. Hold this position 5 to 10 sec and repeat 5 to 10 times.  Knee to Chest. Pull first 1 knee up against your chest and hold for 20 to 30 seconds, repeat this with the other knee, and then both knees. This may be done with the other leg straight or bent, whichever feels better.  Sit-Ups or Curl-Ups. Bend your knees 90 degrees. Start with tilting your pelvis, and do a partial, slow sit-up, lifting your trunk only 30 to 45 degrees off the floor. Take at least 2 to 3 seconds for each sit-up. Do not do sit-ups with your knees out straight. If partial sit-ups are difficult, simply do the above but with only tightening your abdominal muscles and holding it as directed.  Hip-Lift. Lie on your back with your knees flexed 90 degrees. Push down with your feet and shoulders as you raise your hips a couple inches off the floor; hold for 10 seconds, repeat 5 to 10 times.  Back arches. Lie on your stomach, propping yourself up on bent elbows. Slowly press on your hands, causing an arch in your low back. Repeat 3 to 5 times. Any initial stiffness and discomfort should lessen with repetition over time.  Shoulder-Lifts. Lie face down with arms beside your body. Keep hips and torso pressed to floor as you slowly lift your head and shoulders off the floor. Do not overdo your exercises, especially in the beginning. Exercises may cause you some mild back discomfort which lasts for a few minutes; however, if the  pain is more severe, or lasts for more than 15 minutes, do not continue exercises until you see your caregiver. Improvement with exercise therapy for back problems is slow.  See your caregivers for assistance with developing a proper back exercise program. Document Released: 11/17/2004 Document Revised: 01/02/2012 Document Reviewed: 08/11/2011 [facility] Patient Information 2015 [place], [place]. This information is not intended to replace advice given to you by your health care provider. Make sure you discuss any questions you have with your health care provider.

## 2015-05-22 ENCOUNTER — Encounter: Payer: Self-pay | Admitting: Nurse Practitioner

## 2015-05-22 NOTE — Progress Notes (Signed)
Subjective:    Patient ID: Lori [name], female    DOB: 10-20-1951, 64 y.o.   MRN: [id number]  HPI presents for her wellness exam. Patient states she's had a hysterectomy, unsure if she still has her ovaries. No vaginal discharge. No new sexual partners. Eating healthy. Walking for about an hour every day. Regular vision and dental exams.    Review of Systems  Constitutional: Negative for activity change, appetite change and fatigue.  HENT: Negative for dental problem, ear pain, sinus pressure and sore throat.   Respiratory: Negative for cough, chest tightness, shortness of breath and wheezing.   Cardiovascular: Negative for chest pain.  Gastrointestinal: Negative for nausea, vomiting, abdominal pain, diarrhea, constipation, blood in stool and abdominal distention.  Genitourinary: Negative for dysuria, urgency, frequency, vaginal discharge, enuresis, difficulty urinating, genital sores and pelvic pain.  Musculoskeletal: Positive for back pain.       Recheck on left lower thoracic area pain. Better with Voltaren. Naproxen not working as well. Now having pain in the left lumbar area radiating into the left buttock down the lateral left thigh, occasionally to left lateral calf area. No numbness or weakness.        Objective:   Physical Exam  Constitutional: She is oriented to person, place, and time. She appears well-developed. No distress.  HENT:  Right Ear: External ear normal.  Left Ear: External ear normal.  Mouth/Throat: Oropharynx is clear and moist.  Neck: Normal range of motion. Neck supple. No tracheal deviation present. No thyromegaly present.  Cardiovascular: Normal rate, regular rhythm and normal heart sounds.  Exam reveals no gallop.   No murmur heard. Pulmonary/Chest: Effort normal and breath sounds normal.  Abdominal: Soft. She exhibits no distension. There is no tenderness.  Genitourinary: Vagina normal. No vaginal discharge found.  External GU: no rashes or lesions.  Vagina: pale no discharge. Bimanual exam: no tenderness or obvious masses.   Musculoskeletal: She exhibits no edema.  Tenderness to palpation in the left lower thoracic area. Does not radiate past the posterior axillary line. Positive tenderness in the left lumbar area into the mid left buttock there the sciatic notch. SLR positive left negative on the right. Reflexes normal limit lower extremities. Gait slow but steady.  Lymphadenopathy:    She has no cervical adenopathy.  Neurological: She is alert and oriented to person, place, and time.  Skin: Skin is warm and dry. No rash noted.  Psychiatric: She has a normal mood and affect. Her behavior is normal.  Breast exam: no masses; axillae no adenopathy.        Assessment & Plan:  Well woman exam  Left-sided low back pain with left-sided sciatica  Abnormal fasting glucose - Plan: POCT glycosylated hemoglobin (Hb A1C)  Muscle spasm of back  Meds ordered this encounter  Medications  . chlorzoxazone (PARAFON) 500 MG tablet    Sig: Take 1 tablet (500 mg total) by mouth 3 (three) times daily as needed for muscle spasms.    Dispense:  30 tablet    Refill:  0    Order Specific Question:  Supervising Provider    Answer:  Mikey Kirschner [[id number]]  . diclofenac (VOLTAREN) 75 MG EC tablet    Sig: Take 1 tablet (75 mg total) by mouth 2 (two) times daily.    Dispense:  60 tablet    Refill:  0    Order Specific Question:  Supervising Provider    Answer:  Mikey Kirschner [[id number]]   Recommend  daily vitamin D and calcium supplementation. Continue activity. Ice/heat to the low back area. Given copy of back exercises. OTC TENS unit as directed. Call back if worsens or persists. Return in about 1 year (around 05/20/2016) for physical.

## 2015-06-16 ENCOUNTER — Telehealth: Payer: Self-pay | Admitting: Family Medicine

## 2015-06-16 ENCOUNTER — Other Ambulatory Visit: Payer: Self-pay | Admitting: *Deleted

## 2015-06-16 DIAGNOSIS — M549 Dorsalgia, unspecified: Secondary | ICD-10-CM

## 2015-06-16 NOTE — Telephone Encounter (Signed)
Patient said her side is still bothering her.  She said that she has been seen several times in the last few months for the same issue and it is not resolving.   She has trouble walking, sitting, etc.  She would like to know if she needs a referral or an x-ray ordered?

## 2015-06-16 NOTE — Telephone Encounter (Signed)
[facility]. Order for xray put in

## 2015-06-16 NOTE — Telephone Encounter (Signed)
L S spine xray,

## 2015-06-17 ENCOUNTER — Ambulatory Visit (HOSPITAL_COMMUNITY)
Admission: RE | Admit: 2015-06-17 | Discharge: 2015-06-17 | Disposition: A | Discharge status: Home / Self Care | Payer: Federal, State, Local not specified - PPO | Source: Ambulatory Visit | Attending: Family Medicine | Admitting: Family Medicine

## 2015-06-17 DIAGNOSIS — M47896 Other spondylosis, lumbar region: Secondary | ICD-10-CM | POA: Insufficient documentation

## 2015-06-17 DIAGNOSIS — M5416 Radiculopathy, lumbar region: Secondary | ICD-10-CM | POA: Insufficient documentation

## 2015-06-17 DIAGNOSIS — M545 Low back pain: Secondary | ICD-10-CM | POA: Insufficient documentation

## 2015-06-17 NOTE — Telephone Encounter (Signed)
Spoke with patient and informed her that per Dr Richardson Landry and X-ray was ordered and she can now go to Radiology and have her xray completed. Patient verbalized understanding.

## 2015-09-14 ENCOUNTER — Encounter: Payer: Self-pay | Admitting: Family Medicine

## 2015-09-14 ENCOUNTER — Ambulatory Visit (INDEPENDENT_AMBULATORY_CARE_PROVIDER_SITE_OTHER): Payer: Federal, State, Local not specified - PPO | Admitting: Family Medicine

## 2015-09-14 VITALS — BP 138/70 | Temp 99.5°F | Ht 64.5 in | Wt 172.0 lb

## 2015-09-14 DIAGNOSIS — J329 Chronic sinusitis, unspecified: Secondary | ICD-10-CM

## 2015-09-14 MED ORDER — FLUTICASONE PROPIONATE 50 MCG/ACT NA SUSP: 2.0000 | Freq: Every day | NASAL | Status: DC

## 2015-09-14 MED ORDER — HYDROCODONE-HOMATROPINE 5-1.5 MG/5ML PO SYRP: ORAL_SOLUTION | ORAL | Status: DC

## 2015-09-14 MED ORDER — AMOXICILLIN-POT CLAVULANATE 875-125 MG PO TABS: 1.0000 | ORAL_TABLET | Freq: Two times a day (BID) | ORAL | Status: DC

## 2015-09-14 NOTE — Progress Notes (Signed)
   Subjective:    Patient ID: Lori [name], female    DOB: September 29, 1951, 64 y.o.   MRN: [id number]  Cough This is a new problem. Episode onset: 1 week ago. Associated symptoms include ear pain, a fever and wheezing.   Requesting refill on flonase  Frontal congestion  No sig headache  Ears somewhat uncomfortable  Started with rrit exposure outside   Review of Systems  Constitutional: Positive for fever.  HENT: Positive for ear pain.   Respiratory: Positive for cough and wheezing.    No vomiting or diarrhea    Objective:   Physical Exam Alert mild malaise. H&T moderate his congestion frontal tenderness pharynx normal neck supple lungs clear heart regular in rhythm.       Assessment & Plan:  Impression 1 acute rhinosinusitis plan antibiotics prescribed. Symptom care discussed warning signs discussed WSL

## 2015-11-16 ENCOUNTER — Encounter: Payer: Self-pay | Admitting: Family Medicine

## 2015-11-16 ENCOUNTER — Ambulatory Visit (INDEPENDENT_AMBULATORY_CARE_PROVIDER_SITE_OTHER): Payer: Federal, State, Local not specified - PPO | Admitting: Family Medicine

## 2015-11-16 VITALS — BP 138/80 | Temp 98.7°F | Ht 64.5 in | Wt 172.0 lb

## 2015-11-16 DIAGNOSIS — H6501 Acute serous otitis media, right ear: Secondary | ICD-10-CM | POA: Diagnosis not present

## 2015-11-16 MED ORDER — AMOXICILLIN 500 MG PO CAPS: 500.0000 mg | ORAL_CAPSULE | Freq: Three times a day (TID) | ORAL | Status: DC

## 2015-11-16 NOTE — Progress Notes (Signed)
   Subjective:    Patient ID: Lori [name], female    DOB: July 08, 1951, 65 y.o.   MRN: [id number]  Otalgia  There is pain in the right ear. This is a new problem. The current episode started in the past 7 days. The problem has been unchanged. There has been no fever. The pain is at a severity of 0/10. The patient is experiencing no pain. She has tried nothing for the symptoms. The treatment provided no relief.   Patient has no other concerns at this time.   hx of middle ear effsusion in the past  Causing unsteadiness at times. No recent congestion drainage. No particular pain to some muffled sensation. No headache  Review of Systems  HENT: Positive for ear pain.    ROS otherwise negative     Objective:   Physical Exam  Alert vitals stable. Lungs clear heart regular in rhythm H&T impressive effusion with bubbles behind the eardrum neck supple no adenopathy lungs clear      Assessment & Plan:  Impression 1 serous right otitis media patient literally had separate questions about it amoxicillin 3 times a day 10 days expect slow resolution WSL

## 2016-01-22 ENCOUNTER — Ambulatory Visit (INDEPENDENT_AMBULATORY_CARE_PROVIDER_SITE_OTHER): Payer: Federal, State, Local not specified - PPO | Admitting: Internal Medicine

## 2016-01-22 ENCOUNTER — Encounter: Payer: Self-pay | Admitting: Internal Medicine

## 2016-01-22 VITALS — BP 136/88 | HR 72 | Temp 98.6°F | Resp 16 | Wt 170.0 lb

## 2016-01-22 DIAGNOSIS — M8580 Other specified disorders of bone density and structure, unspecified site: Secondary | ICD-10-CM | POA: Diagnosis not present

## 2016-01-22 DIAGNOSIS — R05 Cough: Secondary | ICD-10-CM

## 2016-01-22 DIAGNOSIS — R059 Cough, unspecified: Secondary | ICD-10-CM

## 2016-01-22 NOTE — Progress Notes (Signed)
Pre visit review using our clinic review tool, if applicable. No additional management support is needed unless otherwise documented below in the visit note. 

## 2016-01-22 NOTE — Assessment & Plan Note (Signed)
Taking calcium and vitamin D daily Work on increasing exercise - typically walks and does yoga Will discuss dexa at PE appt

## 2016-01-22 NOTE — Progress Notes (Signed)
Subjective:    Patient ID: Lori [name], female    DOB: 03-14-1951, 65 y.o.   MRN: [id number]  HPI She is here to establish with a new pcp.    Cough:  She started couging two to three months ago.  She has a dry cough.  It seems to have gone away.  She did not have any other symptoms at that time.  Her husband died 4 years ago and she had difficulty after that with depression and insomnia. She was on medication, but that was no longer a problem.  She is not on any medication for either now.  She denies depression and anxiety.  She is dating someone new and has been seeing him for 2 years.  She is intimate with him and has had difficulty getting an orgasm and is not sure why.  It does not bother her as much as it bothers him.   She is trying to sell her house and plans on moving to a smaller place over the summer.    Medications and allergies reviewed with patient and updated if appropriate.  Patient Active Problem List   Diagnosis Date Noted  . Personal history of colonic polyps 06/06/2014  . Family history of colon cancer 06/06/2014  . Osteopenia 11/04/2013  . Neuropathy of hand 04/28/2013  . Depression 01/16/2013  . Insomnia 01/16/2013  . Other malaise and fatigue 01/16/2013    Current Outpatient Prescriptions on File Prior to Visit  Medication Sig Dispense Refill  . fluticasone (FLONASE) 50 MCG/ACT nasal spray Place 2 sprays into both nostrils daily. 16 g 5   No current facility-administered medications on file prior to visit.    Past Medical History  Diagnosis Date  . Osteopenia   . CTS (carpal tunnel syndrome)     chronic  . Tubular adenoma   . PONV (postoperative nausea and vomiting)     Past Surgical History  Procedure Laterality Date  . Appendectomy    . Cholecystectomy    . Tubal ligation    . Rotator cuff surgery    . Colonoscopy  12/09/2008    Dr.Rourk- normal rectum, polyp at the hepatic flexure bx= tubular adenoma. the remainder of the colonic mucosa  and terminal ileum mucosa appeared normal.  . Colonoscopy  2006    Dr.Sam - colonic adenoma  . Colonoscopy N/A 06/25/2014    Procedure: COLONOSCOPY;  Surgeon: Daneil Dolin, MD;  Location: [facility];  Service: Endoscopy;  Laterality: N/A;  1030 DR REQUEST TIME    Social History   Social History  . Marital Status: Widowed    Spouse Name: N/A  . Number of Children: N/A  . Years of Education: N/A   Social History Main Topics  . Smoking status: Former Smoker -- 1.00 packs/day for 10 years    Types: Cigarettes    Start date: 04/13/1971    Quit date: 11/01/1993  . Smokeless tobacco: Never Used  . Alcohol Use: No  . Drug Use: No  . Sexual Activity: Not Currently    Birth Control/ Protection: Surgical   Other Topics Concern  . None   Social History Narrative    Family History  Problem Relation Age of Onset  . Bipolar disorder Sister   . Schizophrenia Sister   . Kidney disease Sister   . Hypertension Sister   . Bipolar disorder Sister   . Kidney disease Sister   . Bipolar disorder Son   . Hypertension Mother   .  Mental illness Mother   . Cancer Father 35    colon; bladder  . Hypertension Father   . Heart disease Father   . Arthritis Father     Review of Systems  Constitutional: Negative for fever, chills and appetite change.  Respiratory: Negative for cough, shortness of breath and wheezing.   Cardiovascular: Negative for chest pain, palpitations and leg swelling.  Gastrointestinal: Negative for nausea and abdominal pain.       No gerd  Musculoskeletal: Negative for back pain and arthralgias.  Neurological: Negative for dizziness, light-headedness and headaches.  Psychiatric/Behavioral: Negative for dysphoric mood. The patient is not nervous/anxious.        Objective:   Filed Vitals:   01/22/16 0903  BP: 136/88  Pulse: 72  Temp: 98.6 F (37 C)  Resp: 16   Filed Weights   01/22/16 0903  Weight: 170 lb (77.111 kg)   Body mass index is 28.74  kg/(m^2).   Physical Exam Constitutional: She appears well-developed and well-nourished. No distress.  HENT:  Head: Normocephalic and atraumatic.  Mouth/Throat: Oropharynx is clear and moist.  Eyes: Conjunctivae and EOM are normal.  Neck: Neck supple. No tracheal deviation present. No thyromegaly present.  No carotid bruit  Cardiovascular: Normal rate, regular rhythm and normal heart sounds.   No murmur heard.  No edema. Pulmonary/Chest: Effort normal and breath sounds normal. No respiratory distress. She has no wheezes. She has no rales.  Abdominal: Soft. She exhibits no distension. There is no tenderness.  Lymphadenopathy: She has no cervical adenopathy.  Skin: Skin is warm and dry. She is not diaphoretic.  Psychiatric: She has a normal mood and affect. Her behavior is normal.       Assessment & Plan:    Cough Was dry and has resolved No further evaluation needed at this time - she will let me know if the cough returns.   See Problem List for Assessment and Plan of chronic medical problems.  Follow up for a physical exam in June

## 2016-01-22 NOTE — Patient Instructions (Signed)
   No immunizations administered today.   Medications reviewed and updated.  No changes recommended at this time.      

## 2016-04-18 ENCOUNTER — Other Ambulatory Visit (INDEPENDENT_AMBULATORY_CARE_PROVIDER_SITE_OTHER): Payer: Federal, State, Local not specified - PPO

## 2016-04-18 ENCOUNTER — Encounter: Payer: Self-pay | Admitting: Internal Medicine

## 2016-04-18 ENCOUNTER — Ambulatory Visit (INDEPENDENT_AMBULATORY_CARE_PROVIDER_SITE_OTHER): Payer: Federal, State, Local not specified - PPO | Admitting: Internal Medicine

## 2016-04-18 VITALS — BP 116/80 | HR 98 | Temp 98.6°F | Resp 16 | Ht 65.0 in | Wt 166.0 lb

## 2016-04-18 DIAGNOSIS — Z1159 Encounter for screening for other viral diseases: Secondary | ICD-10-CM | POA: Diagnosis not present

## 2016-04-18 DIAGNOSIS — Z0000 Encounter for general adult medical examination without abnormal findings: Secondary | ICD-10-CM

## 2016-04-18 DIAGNOSIS — M8580 Other specified disorders of bone density and structure, unspecified site: Secondary | ICD-10-CM | POA: Diagnosis not present

## 2016-04-18 DIAGNOSIS — Z833 Family history of diabetes mellitus: Secondary | ICD-10-CM

## 2016-04-18 LAB — LIPID PANEL
CHOL/HDL RATIO: 3
CHOLESTEROL: 238 mg/dL — AB (ref 0–200)
HDL: 76.9 mg/dL (ref >39.00)
LDL Cholesterol: 149 mg/dL — ABNORMAL HIGH (ref 0–99)
NONHDL: 161.49
Triglycerides: 62 mg/dL (ref 0.0–149.0)
VLDL: 12.4 mg/dL (ref 0.0–40.0)

## 2016-04-18 LAB — COMPREHENSIVE METABOLIC PANEL
ALK PHOS: 67 U/L (ref 39–117)
ALT: 14 U/L (ref 0–35)
AST: 16 U/L (ref 0–37)
Albumin: 4.7 g/dL (ref 3.5–5.2)
BUN: 14 mg/dL (ref 6–23)
CALCIUM: 9.8 mg/dL (ref 8.4–10.5)
CO2: 30 mEq/L (ref 19–32)
Chloride: 104 mEq/L (ref 96–112)
Creatinine, Ser: 0.73 mg/dL (ref 0.40–1.20)
GFR: 103.55 mL/min (ref >60.00)
Glucose, Bld: 111 mg/dL — ABNORMAL HIGH (ref 70–99)
POTASSIUM: 4.7 meq/L (ref 3.5–5.1)
Sodium: 141 mEq/L (ref 135–145)
TOTAL PROTEIN: 7.3 g/dL (ref 6.0–8.3)
Total Bilirubin: 0.8 mg/dL (ref 0.2–1.2)

## 2016-04-18 LAB — CBC WITH DIFFERENTIAL/PLATELET
Basophils Absolute: 0 10*3/uL (ref 0.0–0.1)
Basophils Relative: 1.1 % (ref 0.0–3.0)
EOS PCT: 2.6 % (ref 0.0–5.0)
Eosinophils Absolute: 0.1 10*3/uL (ref 0.0–0.7)
HEMATOCRIT: 42 % (ref 36.0–46.0)
HEMOGLOBIN: 14.5 g/dL (ref 12.0–15.0)
Lymphocytes Relative: 48 % — ABNORMAL HIGH (ref 12.0–46.0)
Lymphs Abs: 1.7 10*3/uL (ref 0.7–4.0)
MCHC: 34.5 g/dL (ref 30.0–36.0)
MCV: 87.7 fl (ref 78.0–100.0)
MONOS PCT: 8.5 % (ref 3.0–12.0)
Monocytes Absolute: 0.3 10*3/uL (ref 0.1–1.0)
Neutro Abs: 1.4 10*3/uL (ref 1.4–7.7)
Neutrophils Relative %: 39.8 % — ABNORMAL LOW (ref 43.0–77.0)
Platelets: 282 10*3/uL (ref 150.0–400.0)
RBC: 4.79 Mil/uL (ref 3.87–5.11)
RDW: 13.7 % (ref 11.5–15.5)
WBC: 3.6 10*3/uL — AB (ref 4.0–10.5)

## 2016-04-18 LAB — TSH: TSH: 0.99 u[IU]/mL (ref 0.35–4.50)

## 2016-04-18 LAB — HEMOGLOBIN A1C: Hgb A1c MFr Bld: 5.4 % (ref 4.6–6.5)

## 2016-04-18 NOTE — Progress Notes (Signed)
Subjective:    Patient ID: Lori [name], female    DOB: 01/03/1951, 65 y.o.   MRN: [id number]  HPI She is here for a physical exam.   She has no concerns.    Medications and allergies reviewed with patient and updated if appropriate.  Patient Active Problem List   Diagnosis Date Noted  . Personal history of colonic polyps 06/06/2014  . Family history of colon cancer 06/06/2014  . Osteopenia 11/04/2013  . Neuropathy of hand 04/28/2013    Current Outpatient Prescriptions on File Prior to Visit  Medication Sig Dispense Refill  . CALCIUM-VITAMIN D PO Take by mouth.    . Cyanocobalamin (VITAMIN B-12 PO) Take by mouth.    . fluticasone (FLONASE) 50 MCG/ACT nasal spray Place 2 sprays into both nostrils daily. 16 g 5  . Multiple Vitamins-Minerals (CENTRUM ULTRA WOMENS PO) Take by mouth.     No current facility-administered medications on file prior to visit.    Past Medical History  Diagnosis Date  . Osteopenia   . CTS (carpal tunnel syndrome)     chronic  . Tubular adenoma   . PONV (postoperative nausea and vomiting)   . Depression 01/16/2013    Past Surgical History  Procedure Laterality Date  . Appendectomy    . Cholecystectomy    . Tubal ligation    . Rotator cuff surgery    . Colonoscopy  12/09/2008    Dr.Rourk- normal rectum, polyp at the hepatic flexure bx= tubular adenoma. the remainder of the colonic mucosa and terminal ileum mucosa appeared normal.  . Colonoscopy  2006    Dr.Sam [place]- colonic adenoma  . Colonoscopy N/A 06/25/2014    Procedure: COLONOSCOPY;  Surgeon: Daneil Dolin, MD;  Location: [facility];  Service: Endoscopy;  Laterality: N/A;  1030 DR REQUEST TIME  . Abdominal hysterectomy  1994    fibroids  . Knee arthroscopy Right     torn meniscus  . Bunionectomy Right   . Orif toe fracture Right     Social History   Social History  . Marital Status: Widowed    Spouse Name: N/A  . Number of Children: 2  . Years of Education: N/A    Social History Main Topics  . Smoking status: Former Smoker -- 1.00 packs/day for 10 years    Types: Cigarettes    Start date: 04/13/1971    Quit date: 11/01/1993  . Smokeless tobacco: Never Used  . Alcohol Use: No  . Drug Use: No  . Sexual Activity: Not Currently    Birth Control/ Protection: Surgical   Other Topics Concern  . Not on file   Social History Narrative   No regular exericse    Family History  Problem Relation Age of Onset  . Bipolar disorder Sister   . Schizophrenia Sister   . Kidney disease Sister   . Hypertension Sister   . Bipolar disorder Sister   . Kidney disease Sister   . Bipolar disorder Son   . Hypertension Mother   . Mental illness Mother   . Dementia Mother   . Cancer Father 39    colon; bladder  . Hypertension Father   . Heart disease Father   . Arthritis Father     Review of Systems  Constitutional: Negative for fever, chills, appetite change, fatigue and unexpected weight change.  HENT: Negative for hearing loss.   Eyes: Negative for visual disturbance.  Respiratory: Negative for cough, shortness of breath and wheezing.  Cardiovascular: Negative for chest pain, palpitations and leg swelling.  Gastrointestinal: Negative for nausea, abdominal pain, diarrhea, constipation and blood in stool.       No gerd  Genitourinary: Negative for dysuria and hematuria.  Musculoskeletal: Negative for myalgias, back pain and arthralgias.  Skin: Negative for color change and rash.  Neurological: Negative for dizziness, weakness, light-headedness, numbness and headaches.  Psychiatric/Behavioral: Negative for dysphoric mood. The patient is not nervous/anxious.        Objective:   Filed Vitals:   04/18/16 0832  BP: 116/80  Pulse: 98  Temp: 98.6 F (37 C)  Resp: 16   Filed Weights   04/18/16 0832  Weight: 166 lb (75.297 kg)   Body mass index is 27.62 kg/(m^2).   Physical Exam Constitutional: She appears well-developed and well-nourished.  No distress.  HENT:  Head: Normocephalic and atraumatic.  Right Ear: External ear normal. Normal ear canal and TM Left Ear: External ear normal.  Normal ear canal and TM Mouth/Throat: Oropharynx is clear and moist.  Eyes: Conjunctivae and EOM are normal.  Neck: Neck supple. No tracheal deviation present. No thyromegaly present.  No carotid bruit  Cardiovascular: Normal rate, regular rhythm and normal heart sounds.   No murmur heard.  No edema. Pulmonary/Chest: Effort normal and breath sounds normal. No respiratory distress. She has no wheezes. She has no rales.  Breast: normal exam - no lumps, skin changes, or nipple discharge Abdominal: Soft. She exhibits no distension. There is no tenderness.  Lymphadenopathy: She has no cervical adenopathy.  Skin: Skin is warm and dry. She is not diaphoretic. skin tag on left shoulder Psychiatric: She has a normal mood and affect. Her behavior is normal.         Assessment & Plan:   Physical exam: Screening blood work ordered Immunizations  Up to date Colonoscopy  Up to date  Mammogram   - will schedule Gyn  - not currently seeing one Dexa  - ordered Eye exams - due, will schedule EKG  ekg in chart - normal, no indication today fo rEKG Exercise - regular exercise - walking, yoga Weight - working on weight loss - joined [employer] watchers Skin  - no concerns Substance abuse - none   See Problem List for Assessment and Plan of chronic medical problems.   F/u annually

## 2016-04-18 NOTE — Assessment & Plan Note (Signed)
dexa ordered Walking, yoga Calcium/vita d daily

## 2016-04-18 NOTE — Progress Notes (Signed)
Pre visit review using our clinic review tool, if applicable. No additional management support is needed unless otherwise documented below in the visit note. 

## 2016-04-18 NOTE — Patient Instructions (Addendum)
Test(s) ordered today. Your results will be released to MyChart (or called to you) after review, usually within 72hours after test completion. If any changes need to be made, you will be notified at that same time.  All other Health Maintenance issues reviewed.   All recommended immunizations and age-appropriate screenings are up-to-date or discussed.  No immunizations administered today.   Medications reviewed and updated.  No changes recommended at this time.   A dexa was ordered  Please followup in one year for a physical exam.    Health Maintenance, Female Adopting a healthy lifestyle and getting preventive care can go a long way to promote health and wellness. Talk with your health care provider about what schedule of regular examinations is right for you. This is a good chance for you to check in with your provider about disease prevention and staying healthy. In between checkups, there are plenty of things you can do on your own. Experts have done a lot of research about which lifestyle changes and preventive measures are most likely to keep you healthy. Ask your health care provider for more information. WEIGHT AND DIET  Eat a healthy diet  Be sure to include plenty of vegetables, fruits, low-fat dairy products, and lean protein.  Do not eat a lot of foods high in solid fats, added sugars, or salt.  Get regular exercise. This is one of the most important things you can do for your health.  Most adults should exercise for at least 150 minutes each week. The exercise should increase your heart rate and make you sweat (moderate-intensity exercise).  Most adults should also do strengthening exercises at least twice a week. This is in addition to the moderate-intensity exercise.  Maintain a healthy weight  Body mass index (BMI) is a measurement that can be used to identify possible weight problems. It estimates body fat based on height and weight. Your health care provider can  help determine your BMI and help you achieve or maintain a healthy weight.  For females 20 years of age and older:   A BMI below 18.5 is considered underweight.  A BMI of 18.5 to 24.9 is normal.  A BMI of 25 to 29.9 is considered overweight.  A BMI of 30 and above is considered obese.  Watch levels of cholesterol and blood lipids  You should start having your blood tested for lipids and cholesterol at 63 years of age, then have this test every 5 years.  You may need to have your cholesterol levels checked more often if:  Your lipid or cholesterol levels are high.  You are older than 63 years of age.  You are at high risk for heart disease.  CANCER SCREENING   Lung Cancer  Lung cancer screening is recommended for adults 55-80 years old who are at high risk for lung cancer because of a history of smoking.  A yearly low-dose CT scan of the lungs is recommended for people who:  Currently smoke.  Have quit within the past 15 years.  Have at least a 30-pack-year history of smoking. A pack year is smoking an average of one pack of cigarettes a day for 1 year.  Yearly screening should continue until it has been 15 years since you quit.  Yearly screening should stop if you develop a health problem that would prevent you from having lung cancer treatment.  Breast Cancer  Practice breast self-awareness. This means understanding how your breasts normally appear and feel.  It also   means doing regular breast self-exams. Let your health care provider know about any changes, no matter how small.  If you are in your 20s or 30s, you should have a clinical breast exam (CBE) by a health care provider every 1-3 years as part of a regular health exam.  If you are 40 or older, have a CBE every year. Also consider having a breast X-ray (mammogram) every year.  If you have a family history of breast cancer, talk to your health care provider about genetic screening.  If you are at high  risk for breast cancer, talk to your health care provider about having an MRI and a mammogram every year.  Breast cancer gene (BRCA) assessment is recommended for women who have family members with BRCA-related cancers. BRCA-related cancers include:  Breast.  Ovarian.  Tubal.  Peritoneal cancers.  Results of the assessment will determine the need for genetic counseling and BRCA1 and BRCA2 testing. Cervical Cancer Your health care provider may recommend that you be screened regularly for cancer of the pelvic organs (ovaries, uterus, and vagina). This screening involves a pelvic examination, including checking for microscopic changes to the surface of your cervix (Pap test). You may be encouraged to have this screening done every 3 years, beginning at age 21.  For women ages 30-65, health care providers may recommend pelvic exams and Pap testing every 3 years, or they may recommend the Pap and pelvic exam, combined with testing for human papilloma virus (HPV), every 5 years. Some types of HPV increase your risk of cervical cancer. Testing for HPV may also be done on women of any age with unclear Pap test results.  Other health care providers may not recommend any screening for nonpregnant women who are considered low risk for pelvic cancer and who do not have symptoms. Ask your health care provider if a screening pelvic exam is right for you.  If you have had past treatment for cervical cancer or a condition that could lead to cancer, you need Pap tests and screening for cancer for at least 20 years after your treatment. If Pap tests have been discontinued, your risk factors (such as having a new sexual partner) need to be reassessed to determine if screening should resume. Some women have medical problems that increase the chance of getting cervical cancer. In these cases, your health care provider may recommend more frequent screening and Pap tests. Colorectal Cancer  This type of cancer can  be detected and often prevented.  Routine colorectal cancer screening usually begins at 63 years of age and continues through 63 years of age.  Your health care provider may recommend screening at an earlier age if you have risk factors for colon cancer.  Your health care provider may also recommend using home test kits to check for hidden blood in the stool.  A small camera at the end of a tube can be used to examine your colon directly (sigmoidoscopy or colonoscopy). This is done to check for the earliest forms of colorectal cancer.  Routine screening usually begins at age 50.  Direct examination of the colon should be repeated every 5-10 years through 63 years of age. However, you may need to be screened more often if early forms of precancerous polyps or small growths are found. Skin Cancer  Check your skin from head to toe regularly.  Tell your health care provider about any new moles or changes in moles, especially if there is a change in a mole's shape   or color.  Also tell your health care provider if you have a mole that is larger than the size of a pencil eraser.  Always use sunscreen. Apply sunscreen liberally and repeatedly throughout the day.  Protect yourself by wearing long sleeves, pants, a wide-brimmed hat, and sunglasses whenever you are outside. HEART DISEASE, DIABETES, AND HIGH BLOOD PRESSURE   High blood pressure causes heart disease and increases the risk of stroke. High blood pressure is more likely to develop in:  People who have blood pressure in the high end of the normal range (130-139/85-89 mm Hg).  People who are overweight or obese.  People who are African American.  If you are 18-39 years of age, have your blood pressure checked every 3-5 years. If you are 40 years of age or older, have your blood pressure checked every year. You should have your blood pressure measured twice--once when you are at a hospital or clinic, and once when you are not at a  hospital or clinic. Record the average of the two measurements. To check your blood pressure when you are not at a hospital or clinic, you can use:  An automated blood pressure machine at a pharmacy.  A home blood pressure monitor.  If you are between 55 years and 79 years old, ask your health care provider if you should take aspirin to prevent strokes.  Have regular diabetes screenings. This involves taking a blood sample to check your fasting blood sugar level.  If you are at a normal weight and have a low risk for diabetes, have this test once every three years after 63 years of age.  If you are overweight and have a high risk for diabetes, consider being tested at a younger age or more often. PREVENTING INFECTION  Hepatitis B  If you have a higher risk for hepatitis B, you should be screened for this virus. You are considered at high risk for hepatitis B if:  You were born in a country where hepatitis B is common. Ask your health care provider which countries are considered high risk.  Your parents were born in a high-risk country, and you have not been immunized against hepatitis B (hepatitis B vaccine).  You have HIV or AIDS.  You use needles to inject street drugs.  You live with someone who has hepatitis B.  You have had sex with someone who has hepatitis B.  You get hemodialysis treatment.  You take certain medicines for conditions, including cancer, organ transplantation, and autoimmune conditions. Hepatitis C  Blood testing is recommended for:  Everyone born from 1945 through 1965.  Anyone with known risk factors for hepatitis C. Sexually transmitted infections (STIs)  You should be screened for sexually transmitted infections (STIs) including gonorrhea and chlamydia if:  You are sexually active and are younger than 63 years of age.  You are older than 63 years of age and your health care provider tells you that you are at risk for this type of  infection.  Your sexual activity has changed since you were last screened and you are at an increased risk for chlamydia or gonorrhea. Ask your health care provider if you are at risk.  If you do not have HIV, but are at risk, it may be recommended that you take a prescription medicine daily to prevent HIV infection. This is called pre-exposure prophylaxis (PrEP). You are considered at risk if:  You are sexually active and do not regularly use condoms or know the HIV   partner(s).  You take drugs by injection.  You are sexually active with a partner who has HIV. Talk with your health care provider about whether you are at high risk of being infected with HIV. If you choose to begin PrEP, you should first be tested for HIV. You should then be tested every 3 months for as long as you are taking PrEP.  PREGNANCY   If you are premenopausal and you may become pregnant, ask your health care provider about preconception counseling.  If you may become pregnant, take 400 to 800 micrograms (mcg) of folic acid every day.  If you want to prevent pregnancy, talk to your health care provider about birth control (contraception). OSTEOPOROSIS AND MENOPAUSE   Osteoporosis is a disease in which the bones lose minerals and strength with aging. This can result in serious bone fractures. Your risk for osteoporosis can be identified using a bone density scan.  If you are 71 years of age or older, or if you are at risk for osteoporosis and fractures, ask your health care provider if you should be screened.  Ask your health care provider whether you should take a calcium or vitamin D supplement to lower your risk for osteoporosis.  Menopause may have certain physical symptoms and risks.  Hormone replacement therapy may reduce some of these symptoms and risks. Talk to your health care provider about whether hormone replacement therapy is right for you.  HOME CARE INSTRUCTIONS   Schedule regular  health, dental, and eye exams.  Stay current with your immunizations.   Do not use any tobacco products including cigarettes, chewing tobacco, or electronic cigarettes.  If you are pregnant, do not drink alcohol.  If you are breastfeeding, limit how much and how often you drink alcohol.  Limit alcohol intake to no more than 1 drink per day for nonpregnant women. One drink equals 12 ounces of beer, 5 ounces of wine, or 1 ounces of hard liquor.  Do not use street drugs.  Do not share needles.  Ask your health care provider for help if you need support or information about quitting drugs.  Tell your health care provider if you often feel depressed.  Tell your health care provider if you have ever been abused or do not feel safe at home.   This information is not intended to replace advice given to you by your health care provider. Make sure you discuss any questions you have with your health care provider.   Document Released: 04/25/2011 Document Revised: 10/31/2014 Document Reviewed: 09/11/2013 Elsevier Interactive Patient Education [employer].

## 2016-04-19 LAB — HEPATITIS C ANTIBODY: HCV Ab: NEGATIVE

## 2016-06-21 ENCOUNTER — Ambulatory Visit (INDEPENDENT_AMBULATORY_CARE_PROVIDER_SITE_OTHER)
Admission: RE | Admit: 2016-06-21 | Discharge: 2016-06-21 | Disposition: A | Discharge status: Home / Self Care | Payer: Federal, State, Local not specified - PPO | Source: Ambulatory Visit | Attending: Internal Medicine | Admitting: Internal Medicine

## 2016-06-21 DIAGNOSIS — M8580 Other specified disorders of bone density and structure, unspecified site: Secondary | ICD-10-CM | POA: Diagnosis not present

## 2016-06-22 DIAGNOSIS — M8580 Other specified disorders of bone density and structure, unspecified site: Secondary | ICD-10-CM | POA: Diagnosis not present

## 2016-09-05 LAB — HM MAMMOGRAPHY

## 2016-09-08 ENCOUNTER — Encounter: Payer: Self-pay | Admitting: Internal Medicine

## 2016-10-24 HISTORY — PX: BUNIONECTOMY: SHX129

## 2016-11-09 ENCOUNTER — Ambulatory Visit: Payer: Federal, State, Local not specified - PPO | Admitting: Internal Medicine

## 2016-11-22 ENCOUNTER — Encounter: Payer: Self-pay | Admitting: Internal Medicine

## 2016-11-22 ENCOUNTER — Ambulatory Visit (INDEPENDENT_AMBULATORY_CARE_PROVIDER_SITE_OTHER): Payer: Federal, State, Local not specified - PPO | Admitting: Internal Medicine

## 2016-11-22 VITALS — BP 142/88 | HR 87 | Temp 98.5°F | Resp 16 | Wt 173.0 lb

## 2016-11-22 DIAGNOSIS — J069 Acute upper respiratory infection, unspecified: Secondary | ICD-10-CM | POA: Diagnosis not present

## 2016-11-22 DIAGNOSIS — B9789 Other viral agents as the cause of diseases classified elsewhere: Secondary | ICD-10-CM

## 2016-11-22 MED ORDER — HYDROCOD POLST-CPM POLST ER 10-8 MG/5ML PO SUER
5.0000 mL | Freq: Two times a day (BID) | ORAL | 0 refills | Status: DC | PRN
Start: 2016-11-22 — End: 2017-04-26

## 2016-11-22 NOTE — Progress Notes (Signed)
Pre visit review using our clinic review tool, if applicable. No additional management support is needed unless otherwise documented below in the visit note. 

## 2016-11-22 NOTE — Assessment & Plan Note (Signed)
Likely viral Symptomatic treatment only tussionex cough syrup BID as needed Call if no improvement

## 2016-11-22 NOTE — Progress Notes (Signed)
Subjective:    Patient ID: Lori [name], female    DOB: 1950-12-17, 66 y.o.   MRN: [id number]  HPI She is here for an acute visit for cold symptoms.   Her symptoms started two days ago.    She is experiencing sore throat, low grade fever, nasal congestion, rhinorrhea, cough that is productive at times, wheeze at night, body aches, and headaches.   She denies SOB and chest pain.    She has tried gargling with salt water and took tylenol sinus with some improvement in symptoms.   Medications and allergies reviewed with patient and updated if appropriate.  Patient Active Problem List   Diagnosis Date Noted  . Personal history of colonic polyps 06/06/2014  . Family history of colon cancer 06/06/2014  . Osteopenia 11/04/2013  . Neuropathy of hand 04/28/2013    Current Outpatient Prescriptions on File Prior to Visit  Medication Sig Dispense Refill  . CALCIUM-VITAMIN D PO Take by mouth.    . Cyanocobalamin (VITAMIN B-12 PO) Take by mouth.    . fluticasone (FLONASE) 50 MCG/ACT nasal spray Place 2 sprays into both nostrils daily. 16 g 5  . Multiple Vitamins-Minerals (CENTRUM ULTRA WOMENS PO) Take by mouth.     No current facility-administered medications on file prior to visit.     Past Medical History:  Diagnosis Date  . CTS (carpal tunnel syndrome)    chronic  . Depression 01/16/2013  . Osteopenia   . PONV (postoperative nausea and vomiting)   . Tubular adenoma     Past Surgical History:  Procedure Laterality Date  . ABDOMINAL HYSTERECTOMY  1994   fibroids  . APPENDECTOMY    . BUNIONECTOMY Right   . CHOLECYSTECTOMY    . COLONOSCOPY  12/09/2008   Dr.Rourk- normal rectum, polyp at the hepatic flexure bx= tubular adenoma. the remainder of the colonic mucosa and terminal ileum mucosa appeared normal.  . COLONOSCOPY  2006   Dr.Sam [place] Burke- colonic adenoma  . COLONOSCOPY N/A 06/25/2014   Procedure: COLONOSCOPY;  Surgeon: Daneil Dolin, MD;  Location: [facility];   Service: Endoscopy;  Laterality: N/A;  1030 DR REQUEST TIME  . KNEE ARTHROSCOPY Right    torn meniscus  . ORIF TOE FRACTURE Right   . rotator Cuff surgery    . TUBAL LIGATION      Social History   Social History  . Marital status: Widowed    Spouse name: N/A  . Number of children: 2  . Years of education: N/A   Social History Main Topics  . Smoking status: Former Smoker    Packs/day: 1.00    Years: 10.00    Types: Cigarettes    Start date: 04/13/1971    Quit date: 11/01/1993  . Smokeless tobacco: Never Used  . Alcohol use No  . Drug use: No  . Sexual activity: Not Currently    Birth control/ protection: Surgical   Other Topics Concern  . Not on file   Social History Narrative   Exercising regularly    Family History  Problem Relation Age of Onset  . Bipolar disorder Sister   . Schizophrenia Sister   . Kidney disease Sister   . Hypertension Sister   . Bipolar disorder Sister   . Kidney disease Sister   . Bipolar disorder Son   . Hypertension Mother   . Mental illness Mother   . Dementia Mother   . Cancer Father 10    colon; bladder  .  Hypertension Father   . Heart disease Father   . Arthritis Father     Review of Systems  Constitutional: Positive for fever (low grade).  HENT: Positive for congestion, postnasal drip, rhinorrhea and sore throat. Negative for ear pain, sinus pain and sinus pressure.   Respiratory: Positive for cough (some phlegm) and wheezing (when laying down). Negative for shortness of breath.   Cardiovascular: Negative for chest pain.  Gastrointestinal: Negative for abdominal pain, diarrhea and nausea.  Musculoskeletal: Positive for myalgias (body aches).  Neurological: Positive for headaches. Negative for dizziness and light-headedness.       Objective:   Vitals:   11/22/16 0754  BP: (!) 142/88  Pulse: 87  Resp: 16  Temp: 98.5 F (36.9 C)   Filed Weights   11/22/16 0754  Weight: 173 lb (78.5 kg)   Body mass index is 28.79  kg/m.  Wt Readings from Last 3 Encounters:  11/22/16 173 lb (78.5 kg)  04/18/16 166 lb (75.3 kg)  01/22/16 170 lb (77.1 kg)     Physical Exam GENERAL APPEARANCE: Appears stated age, well appearing, NAD EYES: conjunctiva clear, no icterus HEENT: bilateral tympanic membranes and ear canals normal, oropharynx with mild erythema, no thyromegaly, trachea midline, no cervical or supraclavicular lymphadenopathy LUNGS: Clear to auscultation without wheeze or crackles, unlabored breathing, good air entry bilaterally HEART: Normal S1,S2 without murmurs EXTREMITIES: Without clubbing, cyanosis, or edema        Assessment & Plan:   See Problem List for Assessment and Plan of chronic medical problems.

## 2016-11-22 NOTE — Patient Instructions (Signed)
A prescription for cough medication was given to you.  This may make you drowsy.    If your symptoms worsen or fail to improve, please contact our office for further instruction, or in case of emergency go directly to the emergency room at the closest medical facility.   General Recommendations:    Please drink plenty of fluids.  Get plenty of rest   Sleep in humidified air  Use saline nasal sprays  Netti pot  OTC Medications:  Decongestants - helps relieve congestion   Flonase (generic fluticasone) or Nasacort (generic triamcinolone) - please make sure to use the "cross-over" technique at a 45 degree angle towards the opposite eye as opposed to straight up the nasal passageway.   Sudafed (generic pseudoephedrine - Note this is the one that is available behind the pharmacy counter); Products with phenylephrine (-PE) may also be used but is often not as effective as pseudoephedrine.   If you have HIGH BLOOD PRESSURE - Coricidin HBP; AVOID any product that is -D as this contains pseudoephedrine which may increase your blood pressure.  Afrin (oxymetazoline) every 6-8 hours for up to 3 days.  Allergies - helps relieve runny nose, itchy eyes and sneezing   Claritin (generic loratidine), Allegra (fexofenidine), or Zyrtec (generic cyrterizine) for runny nose. These medications should not cause drowsiness.  Note - Benadryl (generic diphenhydramine) may be used however may cause drowsiness  Cough -   Delsym or Robitussin (generic dextromethorphan)  Expectorants - helps loosen mucus to ease removal   Mucinex (generic guaifenesin) as directed on the package.  Headaches / General Aches   Tylenol (generic acetaminophen) - DO NOT EXCEED 3 grams (3,000 mg) in a 24 hour time period  Advil/Motrin (generic ibuprofen)  Sore Throat -   Salt water gargle   Chloraseptic (generic benzocaine) spray or lozenges / Sucrets (generic dyclonine)

## 2016-11-28 ENCOUNTER — Telehealth: Payer: Self-pay | Admitting: Internal Medicine

## 2016-11-28 NOTE — Telephone Encounter (Signed)
Pt called in and said that she is not much better at all from the 30th.  Is there anything else she can do or take ?

## 2016-11-29 NOTE — Telephone Encounter (Signed)
Please advise 

## 2016-11-29 NOTE — Telephone Encounter (Signed)
We can try an antibiotic if she feels she has not gotten better .

## 2016-11-29 NOTE — Telephone Encounter (Signed)
Pt is still having the Congestion in chest, coughing. She is unable to "get all of it up" Advised her to try taking mucinex. Please advise if she can do anything else.

## 2016-11-29 NOTE — Telephone Encounter (Signed)
What are her current symptoms?

## 2016-11-30 MED ORDER — CEFDINIR 300 MG PO CAPS: 300.0000 mg | ORAL_CAPSULE | Freq: Two times a day (BID) | ORAL | 0 refills | Status: DC

## 2016-11-30 NOTE — Telephone Encounter (Signed)
Antibiotic sent

## 2016-11-30 NOTE — Telephone Encounter (Signed)
Spoke with pt to inform. Pt would like antibiotic to be called into her POF. She did state that she was feeling a little better after starting the mucinex.

## 2017-01-19 ENCOUNTER — Encounter: Payer: Self-pay | Admitting: Internal Medicine

## 2017-01-19 ENCOUNTER — Ambulatory Visit (INDEPENDENT_AMBULATORY_CARE_PROVIDER_SITE_OTHER)
Admission: RE | Admit: 2017-01-19 | Discharge: 2017-01-19 | Disposition: A | Discharge status: Home / Self Care | Payer: Federal, State, Local not specified - PPO | Source: Ambulatory Visit | Attending: Internal Medicine | Admitting: Internal Medicine

## 2017-01-19 ENCOUNTER — Ambulatory Visit (INDEPENDENT_AMBULATORY_CARE_PROVIDER_SITE_OTHER): Payer: Federal, State, Local not specified - PPO | Admitting: Internal Medicine

## 2017-01-19 ENCOUNTER — Other Ambulatory Visit: Payer: Federal, State, Local not specified - PPO

## 2017-01-19 VITALS — BP 140/70 | HR 84 | Temp 98.2°F | Resp 14 | Ht 65.0 in | Wt 176.0 lb

## 2017-01-19 DIAGNOSIS — M79641 Pain in right hand: Secondary | ICD-10-CM

## 2017-01-19 MED ORDER — PREDNISONE 20 MG PO TABS: 40.0000 mg | ORAL_TABLET | Freq: Every day | ORAL | 0 refills | Status: DC

## 2017-01-19 NOTE — Progress Notes (Signed)
Pre visit review using our clinic review tool, if applicable. No additional management support is needed unless otherwise documented below in the visit note. 

## 2017-01-19 NOTE — Progress Notes (Signed)
   Subjective:    Patient ID: Lori [name], female    DOB: August 12, 1951, 66 y.o.   MRN: [id number]  HPI The patient is a 66 YO female coming in for right hand pain. Going on about 4-6 weeks at this time. Started in between her 3-4th fingers in the webbing and then spread to the 2-3rd and 3-4th fingers between. Sometimes her whole hand hurts. Does not seem to matter she is doing things or not. Some swelling of the hand in general but no swelling of a digit or joint. No rash or skin color change. Denies increased activity or injury to the area. Called hand specialist and they cannot see her until May. Has tried tylenol and aleve without much relief. Is daily and lasts for hours. Some stiffness in the morning. No weakness of the hand except some limitation with pain. No numbness of the arm or hand. Pain does not radiate into the arm or shoulder.   Review of Systems  Constitutional: Negative.   Respiratory: Negative.   Cardiovascular: Negative.   Gastrointestinal: Negative.   Musculoskeletal: Positive for arthralgias and myalgias. Negative for back pain, gait problem, joint swelling, neck pain and neck stiffness.  Skin: Negative.   Neurological: Negative.       Objective:   Physical Exam  Constitutional: She appears well-developed and well-nourished.  HENT:  Head: Normocephalic and atraumatic.  Eyes: EOM are normal.  Cardiovascular: Normal rate and regular rhythm.   Pulmonary/Chest: Effort normal.  Abdominal: Soft.  Musculoskeletal: She exhibits tenderness. She exhibits no edema.  Some pain in her webbing, no rash or effusion on the right hand. Tinel negative in office.   Skin: Skin is warm and dry. No rash noted. No erythema. No pallor.   Vitals:   01/19/17 1520  BP: 140/70  Pulse: 84  Resp: 14  Temp: 98.2 F (36.8 C)  TempSrc: Oral  SpO2: 98%  Weight: 176 lb (79.8 kg)  Height: 5\' 5"  (1.651 m)      Assessment & Plan:

## 2017-01-19 NOTE — Assessment & Plan Note (Signed)
Suspect tendonitis and checking X-ray hand to rule out arthritis. Given the course and severity checking RF to rule out RA. Rx for prednisone for relief of pain and symptoms. Could also be atypical carpal tunnel however not typical for this.

## 2017-01-19 NOTE — Patient Instructions (Signed)
We are checking the x-ray and the labs today to find the cause of the pain.  We have sent in prednisone to take for the pain. Take 2 pills daily for 5 days then stop.

## 2017-01-20 LAB — RHEUMATOID FACTOR: Rhuematoid fact SerPl-aCnc: <14 IU/mL (ref <14)

## 2017-01-25 ENCOUNTER — Ambulatory Visit: Payer: Self-pay | Admitting: Internal Medicine

## 2017-04-26 NOTE — Progress Notes (Signed)
Subjective:    Patient ID: Lori [name], female    DOB: 1951-02-19, 66 y.o.   MRN: [id number]  HPI She is here for a physical exam.   She denies changes in her health since she was here last.  She has no concerns.   She is exercising regularly and has not been able to lose any more weight.    Medications and allergies reviewed with patient and updated if appropriate.  Patient Active Problem List   Diagnosis Date Noted  . Right hand pain 01/19/2017  . Viral upper respiratory tract infection 11/22/2016  . Personal history of colonic polyps 06/06/2014  . Family history of colon cancer 06/06/2014  . Osteopenia 11/04/2013  . Neuropathy of hand 04/28/2013    Current Outpatient Prescriptions on File Prior to Visit  Medication Sig Dispense Refill  . CALCIUM-VITAMIN D PO Take by mouth.    . Cyanocobalamin (VITAMIN B-12 PO) Take by mouth.    . Multiple Vitamins-Minerals (CENTRUM ULTRA WOMENS PO) Take by mouth.     No current facility-administered medications on file prior to visit.     Past Medical History:  Diagnosis Date  . CTS (carpal tunnel syndrome)    chronic  . Depression 01/16/2013  . Osteopenia   . PONV (postoperative nausea and vomiting)   . Tubular adenoma     Past Surgical History:  Procedure Laterality Date  . ABDOMINAL HYSTERECTOMY  1994   fibroids  . APPENDECTOMY    . BUNIONECTOMY Right   . CHOLECYSTECTOMY    . COLONOSCOPY  12/09/2008   Dr.Rourk- normal rectum, polyp at the hepatic flexure bx= tubular adenoma. the remainder of the colonic mucosa and terminal ileum mucosa appeared normal.  . COLONOSCOPY  2006   Dr.Sam Hamler- colonic adenoma  . COLONOSCOPY N/A 06/25/2014   Procedure: COLONOSCOPY;  Surgeon: Daneil Dolin, MD;  Location: [facility];  Service: Endoscopy;  Laterality: N/A;  1030 DR REQUEST TIME  . KNEE ARTHROSCOPY Right    torn meniscus  . ORIF TOE FRACTURE Right   . rotator Cuff surgery    . TUBAL LIGATION      Social History    Social History  . Marital status: Widowed    Spouse name: N/A  . Number of children: 2  . Years of education: N/A   Social History Main Topics  . Smoking status: Former Smoker    Packs/day: 1.00    Years: 10.00    Types: Cigarettes    Start date: 04/13/1971    Quit date: 11/01/1993  . Smokeless tobacco: Never Used  . Alcohol use No  . Drug use: No  . Sexual activity: Not Currently    Birth control/ protection: Surgical   Other Topics Concern  . None   Social History Narrative   Exercising regularly    Family History  Problem Relation Age of Onset  . Bipolar disorder Sister   . Schizophrenia Sister   . Kidney disease Sister   . Hypertension Sister   . Bipolar disorder Sister   . Kidney disease Sister   . Bipolar disorder Son   . Hypertension Mother   . Mental illness Mother   . Dementia Mother   . Cancer Father 43       colon; bladder  . Hypertension Father   . Heart disease Father   . Arthritis Father     Review of Systems  Constitutional: Negative for appetite change, chills, fatigue and fever.  Eyes: Negative  for visual disturbance.  Respiratory: Negative for cough, shortness of breath and wheezing.   Cardiovascular: Negative for chest pain, palpitations and leg swelling.  Gastrointestinal: Negative for abdominal pain, blood in stool, constipation, diarrhea and nausea.  Genitourinary: Negative for dysuria and hematuria.  Musculoskeletal: Positive for arthralgias (right hand only) and joint swelling (right hand). Negative for back pain.  Skin: Negative for color change and rash.  Neurological: Negative for dizziness, light-headedness and headaches.  Psychiatric/Behavioral: Negative for dysphoric mood. The patient is not nervous/anxious.        Objective:   Vitals:   04/27/17 1127  BP: 120/70  Pulse: 74  Resp: 12  Temp: 98.6 F (37 C)   Filed Weights   04/27/17 1127  Weight: 168 lb (76.2 kg)   Body mass index is 27.96 kg/m.  Wt Readings  from Last 3 Encounters:  04/27/17 168 lb (76.2 kg)  01/19/17 176 lb (79.8 kg)  11/22/16 173 lb (78.5 kg)     Physical Exam Constitutional: She appears well-developed and well-nourished. No distress.  HENT:  Head: Normocephalic and atraumatic.  Right Ear: External ear normal. Normal ear canal and TM Left Ear: External ear normal.  Normal ear canal and TM Mouth/Throat: Oropharynx is clear and moist.  Eyes: Conjunctivae and EOM are normal.  Neck: Neck supple. No tracheal deviation present. No thyromegaly present.  No carotid bruit  Cardiovascular: Normal rate, regular rhythm and normal heart sounds.   No murmur heard.  No edema. Pulmonary/Chest: Effort normal and breath sounds normal. No respiratory distress. She has no wheezes. She has no rales.  Breast: deferred to Gyn Abdominal: Soft. She exhibits no distension. There is no tenderness.  Lymphadenopathy: She has no cervical adenopathy.  Skin: Skin is warm and dry. She is not diaphoretic.  Psychiatric: She has a normal mood and affect. Her behavior is normal.         Assessment & Plan:   Physical exam: Screening blood work  ordered Immunizations  Up to date, discussed shingrix Colonoscopy   Up to date  Mammogram   Up to date  Gyn  - not up to date Dexa   Up to date  Eye exams   Not up to date  - will schedule EKG    Last done 2003 Exercise - regularly Weight  Normal BMI Skin  No concerns Substance abuse  none  See Problem List for Assessment and Plan of chronic medical problems.

## 2017-04-26 NOTE — Patient Instructions (Addendum)
Test(s) ordered today. Your results will be released to [place] (or called to you) after review, usually within 72hours after test completion. If any changes need to be made, you will be notified at that same time.  All other Health Maintenance issues reviewed.   All recommended immunizations and age-appropriate screenings are up-to-date or discussed.  No immunizations administered today.   Medications reviewed and updated.  No changes recommended at this time.   Please followup in one year for a physical   Health Maintenance, Female Adopting a healthy lifestyle and getting preventive care can go a long way to promote health and wellness. Talk with your health care provider about what schedule of regular examinations is right for you. This is a good chance for you to check in with your provider about disease prevention and staying healthy. In between checkups, there are plenty of things you can do on your own. Experts have done a lot of research about which lifestyle changes and preventive measures are most likely to keep you healthy. Ask your health care provider for more information. Weight and diet Eat a healthy diet  Be sure to include plenty of vegetables, fruits, low-fat dairy products, and lean protein.  Do not eat a lot of foods high in solid fats, added sugars, or salt.  Get regular exercise. This is one of the most important things you can do for your health. ? Most adults should exercise for at least 150 minutes each week. The exercise should increase your heart rate and make you sweat (moderate-intensity exercise). ? Most adults should also do strengthening exercises at least twice a week. This is in addition to the moderate-intensity exercise.  Maintain a healthy weight  Body mass index (BMI) is a measurement that can be used to identify possible weight problems. It estimates body fat based on height and weight. Your health care provider can help determine your BMI and help  you achieve or maintain a healthy weight.  For females 72 years of age and older: ? A BMI below 18.5 is considered underweight. ? A BMI of 18.5 to 24.9 is normal. ? A BMI of 25 to 29.9 is considered overweight. ? A BMI of 30 and above is considered obese.  Watch levels of cholesterol and blood lipids  You should start having your blood tested for lipids and cholesterol at 64 years of age, then have this test every 5 years.  You may need to have your cholesterol levels checked more often if: ? Your lipid or cholesterol levels are high. ? You are older than 64 years of age. ? You are at high risk for heart disease.  Cancer screening Lung Cancer  Lung cancer screening is recommended for adults 72-27 years old who are at high risk for lung cancer because of a history of smoking.  A yearly low-dose CT scan of the lungs is recommended for people who: ? Currently smoke. ? Have quit within the past 15 years. ? Have at least a 30-pack-year history of smoking. A pack year is smoking an average of one pack of cigarettes a day for 1 year.  Yearly screening should continue until it has been 15 years since you quit.  Yearly screening should stop if you develop a health problem that would prevent you from having lung cancer treatment.  Breast Cancer  Practice breast self-awareness. This means understanding how your breasts normally appear and feel.  It also means doing regular breast self-exams. Let your health care provider know  about any changes, no matter how small.  If you are in your 20s or 30s, you should have a clinical breast exam (CBE) by a health care provider every 1-3 years as part of a regular health exam.  If you are 7 or older, have a CBE every year. Also consider having a breast X-ray (mammogram) every year.  If you have a family history of breast cancer, talk to your health care provider about genetic screening.  If you are at high risk for breast cancer, talk to your  health care provider about having an MRI and a mammogram every year.  Breast cancer gene (BRCA) assessment is recommended for women who have family members with BRCA-related cancers. BRCA-related cancers include: ? Breast. ? Ovarian. ? Tubal. ? Peritoneal cancers.  Results of the assessment will determine the need for genetic counseling and BRCA1 and BRCA2 testing.  Cervical Cancer Your health care provider may recommend that you be screened regularly for cancer of the pelvic organs (ovaries, uterus, and vagina). This screening involves a pelvic examination, including checking for microscopic changes to the surface of your cervix (Pap test). You may be encouraged to have this screening done every 3 years, beginning at age 18.  For women ages 50-65, health care providers may recommend pelvic exams and Pap testing every 3 years, or they may recommend the Pap and pelvic exam, combined with testing for human papilloma virus (HPV), every 5 years. Some types of HPV increase your risk of cervical cancer. Testing for HPV may also be done on women of any age with unclear Pap test results.  Other health care providers may not recommend any screening for nonpregnant women who are considered low risk for pelvic cancer and who do not have symptoms. Ask your health care provider if a screening pelvic exam is right for you.  If you have had past treatment for cervical cancer or a condition that could lead to cancer, you need Pap tests and screening for cancer for at least 20 years after your treatment. If Pap tests have been discontinued, your risk factors (such as having a new sexual partner) need to be reassessed to determine if screening should resume. Some women have medical problems that increase the chance of getting cervical cancer. In these cases, your health care provider may recommend more frequent screening and Pap tests.  Colorectal Cancer  This type of cancer can be detected and often  prevented.  Routine colorectal cancer screening usually begins at 64 years of age and continues through 64 years of age.  Your health care provider may recommend screening at an earlier age if you have risk factors for colon cancer.  Your health care provider may also recommend using home test kits to check for hidden blood in the stool.  A small camera at the end of a tube can be used to examine your colon directly (sigmoidoscopy or colonoscopy). This is done to check for the earliest forms of colorectal cancer.  Routine screening usually begins at age 22.  Direct examination of the colon should be repeated every 5-10 years through 64 years of age. However, you may need to be screened more often if early forms of precancerous polyps or small growths are found.  Skin Cancer  Check your skin from head to toe regularly.  Tell your health care provider about any new moles or changes in moles, especially if there is a change in a mole's shape or color.  Also tell your health care  provider if you have a mole that is larger than the size of a pencil eraser.  Always use sunscreen. Apply sunscreen liberally and repeatedly throughout the day.  Protect yourself by wearing long sleeves, pants, a wide-brimmed hat, and sunglasses whenever you are outside.  Heart disease, diabetes, and high blood pressure  High blood pressure causes heart disease and increases the risk of stroke. High blood pressure is more likely to develop in: ? People who have blood pressure in the high end of the normal range (130-139/85-89 mm Hg). ? People who are overweight or obese. ? People who are African American.  If you are 18-39 years of age, have your blood pressure checked every 3-5 years. If you are 40 years of age or older, have your blood pressure checked every year. You should have your blood pressure measured twice-once when you are at a hospital or clinic, and once when you are not at a hospital or clinic.  Record the average of the two measurements. To check your blood pressure when you are not at a hospital or clinic, you can use: ? An automated blood pressure machine at a pharmacy. ? A home blood pressure monitor.  If you are between 55 years and 79 years old, ask your health care provider if you should take aspirin to prevent strokes.  Have regular diabetes screenings. This involves taking a blood sample to check your fasting blood sugar level. ? If you are at a normal weight and have a low risk for diabetes, have this test once every three years after 64 years of age. ? If you are overweight and have a high risk for diabetes, consider being tested at a younger age or more often. Preventing infection Hepatitis B  If you have a higher risk for hepatitis B, you should be screened for this virus. You are considered at high risk for hepatitis B if: ? You were born in a country where hepatitis B is common. Ask your health care provider which countries are considered high risk. ? Your parents were born in a high-risk country, and you have not been immunized against hepatitis B (hepatitis B vaccine). ? You have HIV or AIDS. ? You use needles to inject street drugs. ? You live with someone who has hepatitis B. ? You have had sex with someone who has hepatitis B. ? You get hemodialysis treatment. ? You take certain medicines for conditions, including cancer, organ transplantation, and autoimmune conditions.  Hepatitis C  Blood testing is recommended for: ? Everyone born from 1945 through 1965. ? Anyone with known risk factors for hepatitis C.  Sexually transmitted infections (STIs)  You should be screened for sexually transmitted infections (STIs) including gonorrhea and chlamydia if: ? You are sexually active and are younger than 64 years of age. ? You are older than 64 years of age and your health care provider tells you that you are at risk for this type of infection. ? Your sexual  activity has changed since you were last screened and you are at an increased risk for chlamydia or gonorrhea. Ask your health care provider if you are at risk.  If you do not have HIV, but are at risk, it may be recommended that you take a prescription medicine daily to prevent HIV infection. This is called pre-exposure prophylaxis (PrEP). You are considered at risk if: ? You are sexually active and do not regularly use condoms or know the HIV status of your partner(s). ? You take   drugs by injection. ? You are sexually active with a partner who has HIV.  Talk with your health care provider about whether you are at high risk of being infected with HIV. If you choose to begin PrEP, you should first be tested for HIV. You should then be tested every 3 months for as long as you are taking PrEP. Pregnancy  If you are premenopausal and you may become pregnant, ask your health care provider about preconception counseling.  If you may become pregnant, take 400 to 800 micrograms (mcg) of folic acid every day.  If you want to prevent pregnancy, talk to your health care provider about birth control (contraception). Osteoporosis and menopause  Osteoporosis is a disease in which the bones lose minerals and strength with aging. This can result in serious bone fractures. Your risk for osteoporosis can be identified using a bone density scan.  If you are 65 years of age or older, or if you are at risk for osteoporosis and fractures, ask your health care provider if you should be screened.  Ask your health care provider whether you should take a calcium or vitamin D supplement to lower your risk for osteoporosis.  Menopause may have certain physical symptoms and risks.  Hormone replacement therapy may reduce some of these symptoms and risks. Talk to your health care provider about whether hormone replacement therapy is right for you. Follow these instructions at home:  Schedule regular health, dental,  and eye exams.  Stay current with your immunizations.  Do not use any tobacco products including cigarettes, chewing tobacco, or electronic cigarettes.  If you are pregnant, do not drink alcohol.  If you are breastfeeding, limit how much and how often you drink alcohol.  Limit alcohol intake to no more than 1 drink per day for nonpregnant women. One drink equals 12 ounces of beer, 5 ounces of wine, or 1 ounces of hard liquor.  Do not use street drugs.  Do not share needles.  Ask your health care provider for help if you need support or information about quitting drugs.  Tell your health care provider if you often feel depressed.  Tell your health care provider if you have ever been abused or do not feel safe at home. This information is not intended to replace advice given to you by your health care provider. Make sure you discuss any questions you have with your health care provider. Document Released: 04/25/2011 Document Revised: 03/17/2016 Document Reviewed: 07/14/2015 Elsevier Interactive Patient Education  2018 [employer]    

## 2017-04-27 ENCOUNTER — Other Ambulatory Visit (INDEPENDENT_AMBULATORY_CARE_PROVIDER_SITE_OTHER): Payer: Federal, State, Local not specified - PPO

## 2017-04-27 ENCOUNTER — Encounter: Payer: Self-pay | Admitting: Internal Medicine

## 2017-04-27 ENCOUNTER — Ambulatory Visit (INDEPENDENT_AMBULATORY_CARE_PROVIDER_SITE_OTHER): Payer: Federal, State, Local not specified - PPO | Admitting: Internal Medicine

## 2017-04-27 VITALS — BP 120/70 | HR 74 | Temp 98.6°F | Resp 12 | Ht 65.0 in | Wt 168.0 lb

## 2017-04-27 DIAGNOSIS — Z0000 Encounter for general adult medical examination without abnormal findings: Secondary | ICD-10-CM

## 2017-04-27 DIAGNOSIS — M79641 Pain in right hand: Secondary | ICD-10-CM

## 2017-04-27 DIAGNOSIS — E785 Hyperlipidemia, unspecified: Secondary | ICD-10-CM

## 2017-04-27 DIAGNOSIS — M85859 Other specified disorders of bone density and structure, unspecified thigh: Secondary | ICD-10-CM

## 2017-04-27 DIAGNOSIS — E7800 Pure hypercholesterolemia, unspecified: Secondary | ICD-10-CM

## 2017-04-27 DIAGNOSIS — Z0001 Encounter for general adult medical examination with abnormal findings: Secondary | ICD-10-CM | POA: Diagnosis not present

## 2017-04-27 HISTORY — DX: Hyperlipidemia, unspecified: E78.5

## 2017-04-27 LAB — CBC WITH DIFFERENTIAL/PLATELET
Basophils Absolute: 0 10*3/uL (ref 0.0–0.1)
Basophils Relative: 1.2 % (ref 0.0–3.0)
EOS PCT: 2.5 % (ref 0.0–5.0)
Eosinophils Absolute: 0.1 10*3/uL (ref 0.0–0.7)
HCT: 42.4 % (ref 36.0–46.0)
Hemoglobin: 14.6 g/dL (ref 12.0–15.0)
LYMPHS ABS: 1.7 10*3/uL (ref 0.7–4.0)
Lymphocytes Relative: 40.8 % (ref 12.0–46.0)
MCHC: 34.4 g/dL (ref 30.0–36.0)
MCV: 90 fl (ref 78.0–100.0)
MONO ABS: 0.4 10*3/uL (ref 0.1–1.0)
MONOS PCT: 10 % (ref 3.0–12.0)
NEUTROS ABS: 1.8 10*3/uL (ref 1.4–7.7)
NEUTROS PCT: 45.5 % (ref 43.0–77.0)
PLATELETS: 275 10*3/uL (ref 150.0–400.0)
RBC: 4.71 Mil/uL (ref 3.87–5.11)
RDW: 13.9 % (ref 11.5–15.5)
WBC: 4.1 10*3/uL (ref 4.0–10.5)

## 2017-04-27 LAB — COMPREHENSIVE METABOLIC PANEL
ALT: 17 U/L (ref 0–35)
AST: 20 U/L (ref 0–37)
Albumin: 4.6 g/dL (ref 3.5–5.2)
Alkaline Phosphatase: 73 U/L (ref 39–117)
BUN: 13 mg/dL (ref 6–23)
CHLORIDE: 102 meq/L (ref 96–112)
CO2: 30 meq/L (ref 19–32)
Calcium: 9.5 mg/dL (ref 8.4–10.5)
Creatinine, Ser: 0.75 mg/dL (ref 0.40–1.20)
GFR: 100.04 mL/min (ref >60.00)
GLUCOSE: 91 mg/dL (ref 70–99)
POTASSIUM: 3.8 meq/L (ref 3.5–5.1)
SODIUM: 139 meq/L (ref 135–145)
Total Bilirubin: 1.2 mg/dL (ref 0.2–1.2)
Total Protein: 7.4 g/dL (ref 6.0–8.3)

## 2017-04-27 LAB — LIPID PANEL
CHOL/HDL RATIO: 3
Cholesterol: 234 mg/dL — ABNORMAL HIGH (ref 0–200)
HDL: 80.6 mg/dL (ref >39.00)
LDL CALC: 138 mg/dL — AB (ref 0–99)
NONHDL: 153.47
Triglycerides: 75 mg/dL (ref 0.0–149.0)
VLDL: 15 mg/dL (ref 0.0–40.0)

## 2017-04-27 LAB — TSH: TSH: 0.91 u[IU]/mL (ref 0.35–4.50)

## 2017-04-27 NOTE — Assessment & Plan Note (Signed)
Check lipid panel  Continue daily statin Regular exercise and healthy diet encouraged  

## 2017-04-27 NOTE — Assessment & Plan Note (Signed)
dexa up to date Walking/exercising regularly Taking calcium/ vitamin d daily

## 2017-04-27 NOTE — Assessment & Plan Note (Signed)
Saw orthopedics  - pain and swelling related to cone spur between fingers Pain improved with injection Using topical prescribed by ortho

## 2017-05-01 ENCOUNTER — Encounter (HOSPITAL_COMMUNITY): Payer: Self-pay | Admitting: Emergency Medicine

## 2017-05-01 DIAGNOSIS — R509 Fever, unspecified: Secondary | ICD-10-CM | POA: Diagnosis not present

## 2017-05-01 DIAGNOSIS — R1110 Vomiting, unspecified: Secondary | ICD-10-CM | POA: Diagnosis present

## 2017-05-01 DIAGNOSIS — Z5321 Procedure and treatment not carried out due to patient leaving prior to being seen by health care provider: Secondary | ICD-10-CM | POA: Diagnosis not present

## 2017-05-01 LAB — CBC
HEMATOCRIT: 37 % (ref 36.0–46.0)
Hemoglobin: 13.1 g/dL (ref 12.0–15.0)
MCH: 31.2 pg (ref 26.0–34.0)
MCHC: 35.4 g/dL (ref 30.0–36.0)
MCV: 88.1 fL (ref 78.0–100.0)
Platelets: 194 10*3/uL (ref 150–400)
RBC: 4.2 MIL/uL (ref 3.87–5.11)
RDW: 13.4 % (ref 11.5–15.5)
WBC: 11.2 10*3/uL — AB (ref 4.0–10.5)

## 2017-05-01 LAB — COMPREHENSIVE METABOLIC PANEL
ALT: 21 U/L (ref 14–54)
AST: 24 U/L (ref 15–41)
Albumin: 4.3 g/dL (ref 3.5–5.0)
Alkaline Phosphatase: 72 U/L (ref 38–126)
Anion gap: 9 (ref 5–15)
BUN: 12 mg/dL (ref 6–20)
CHLORIDE: 103 mmol/L (ref 101–111)
CO2: 25 mmol/L (ref 22–32)
Calcium: 8.7 mg/dL — ABNORMAL LOW (ref 8.9–10.3)
Creatinine, Ser: 0.84 mg/dL (ref 0.44–1.00)
GFR calc Af Amer: >60 mL/min (ref >60)
Glucose, Bld: 158 mg/dL — ABNORMAL HIGH (ref 65–99)
POTASSIUM: 3.7 mmol/L (ref 3.5–5.1)
SODIUM: 137 mmol/L (ref 135–145)
Total Bilirubin: 2 mg/dL — ABNORMAL HIGH (ref 0.3–1.2)
Total Protein: 7.1 g/dL (ref 6.5–8.1)

## 2017-05-01 LAB — LIPASE, BLOOD: LIPASE: 15 U/L (ref 11–51)

## 2017-05-01 NOTE — ED Triage Notes (Signed)
Pt states Saturday night her right side hurt all night long  Pt states Sunday she had a fever and vomiting  Pt states Monday she had chills, fever, and nausea  Pt states she has not had pain since Sunday morning

## 2017-05-02 ENCOUNTER — Other Ambulatory Visit (INDEPENDENT_AMBULATORY_CARE_PROVIDER_SITE_OTHER): Payer: Federal, State, Local not specified - PPO

## 2017-05-02 ENCOUNTER — Emergency Department (HOSPITAL_COMMUNITY)
Admission: EM | Admit: 2017-05-02 | Discharge: 2017-05-02 | Disposition: A | Discharge status: Home / Self Care | Payer: Federal, State, Local not specified - PPO | Attending: Emergency Medicine | Admitting: Emergency Medicine

## 2017-05-02 ENCOUNTER — Encounter: Payer: Self-pay | Admitting: Internal Medicine

## 2017-05-02 ENCOUNTER — Ambulatory Visit (INDEPENDENT_AMBULATORY_CARE_PROVIDER_SITE_OTHER): Payer: Federal, State, Local not specified - PPO | Admitting: Internal Medicine

## 2017-05-02 VITALS — BP 130/60 | HR 111 | Temp 100.7°F | Resp 20 | Wt 170.0 lb

## 2017-05-02 DIAGNOSIS — N12 Tubulo-interstitial nephritis, not specified as acute or chronic: Secondary | ICD-10-CM

## 2017-05-02 DIAGNOSIS — R509 Fever, unspecified: Secondary | ICD-10-CM | POA: Diagnosis not present

## 2017-05-02 DIAGNOSIS — R300 Dysuria: Secondary | ICD-10-CM

## 2017-05-02 HISTORY — DX: Tubulo-interstitial nephritis, not specified as acute or chronic: N12

## 2017-05-02 LAB — POCT URINALYSIS DIPSTICK
Bilirubin, UA: NEGATIVE
Glucose, UA: NEGATIVE
Ketones, UA: NEGATIVE
NITRITE UA: POSITIVE
Spec Grav, UA: 1.02 (ref 1.010–1.025)
UROBILINOGEN UA: 0.2 U/dL
pH, UA: 6 (ref 5.0–8.0)

## 2017-05-02 LAB — URINALYSIS, ROUTINE W REFLEX MICROSCOPIC
BILIRUBIN URINE: NEGATIVE
KETONES UR: NEGATIVE
NITRITE: POSITIVE — AB
PH: 6 (ref 5.0–8.0)
SPECIFIC GRAVITY, URINE: 1.02 (ref 1.000–1.030)
TOTAL PROTEIN, URINE-UPE24: 100 — AB
Urine Glucose: NEGATIVE
Urobilinogen, UA: 0.2 (ref 0.0–1.0)

## 2017-05-02 LAB — POCT INFLUENZA A/B
Influenza A, POC: NEGATIVE
Influenza B, POC: NEGATIVE

## 2017-05-02 MED ORDER — ONDANSETRON 4 MG PO TBDP: 4.0000 mg | ORAL_TABLET | Freq: Three times a day (TID) | ORAL | 0 refills | Status: DC | PRN

## 2017-05-02 MED ORDER — CIPROFLOXACIN HCL 500 MG PO TABS: 500.0000 mg | ORAL_TABLET | Freq: Two times a day (BID) | ORAL | 0 refills | Status: DC

## 2017-05-02 NOTE — Assessment & Plan Note (Signed)
Along with many other symptoms Rapid flu test negative CBC, CMP reviewed from the emergency room Urine dip suggestive of a UTI/pyelonephritis with other symptoms Continue Tylenol as needed and take antibiotic as prescribed

## 2017-05-02 NOTE — Assessment & Plan Note (Signed)
Probable UTI/pyelonephritis Treatment as above

## 2017-05-02 NOTE — ED Triage Notes (Signed)
Pt told registration clerk she was going to leave

## 2017-05-02 NOTE — Patient Instructions (Addendum)
Take the antibiotic as directed.   If you are not feeling better in 24 hours please call.      Urinary Tract Infection, Adult A urinary tract infection (UTI) is an infection of any part of the urinary tract, which includes the kidneys, ureters, bladder, and urethra. These organs make, store, and get rid of urine in the body. UTI can be a bladder infection (cystitis) or kidney infection (pyelonephritis). What are the causes? This infection may be caused by fungi, viruses, or bacteria. Bacteria are the most common cause of UTIs. This condition can also be caused by repeated incomplete emptying of the bladder during urination. What increases the risk? This condition is more likely to develop if:  You ignore your need to urinate or hold urine for long periods of time.  You do not empty your bladder completely during urination.  You wipe back to front after urinating or having a bowel movement, if you are female.  You are uncircumcised, if you are female.  You are constipated.  You have a urinary catheter that stays in place (indwelling).  You have a weak defense (immune) system.  You have a medical condition that affects your bowels, kidneys, or bladder.  You have diabetes.  You take antibiotic medicines frequently or for long periods of time, and the antibiotics no longer work well against certain types of infections (antibiotic resistance).  You take medicines that irritate your urinary tract.  You are exposed to chemicals that irritate your urinary tract.  You are female.  What are the signs or symptoms? Symptoms of this condition include:  Fever.  Frequent urination or passing small amounts of urine frequently.  Needing to urinate urgently.  Pain or burning with urination.  Urine that smells bad or unusual.  Cloudy urine.  Pain in the lower abdomen or back.  Trouble urinating.  Blood in the urine.  Vomiting or being less hungry than normal.  Diarrhea or  abdominal pain.  Vaginal discharge, if you are female.  How is this diagnosed? This condition is diagnosed with a medical history and physical exam. You will also need to provide a urine sample to test your urine. Other tests may be done, including:  Blood tests.  Sexually transmitted disease (STD) testing.  If you have had more than one UTI, a cystoscopy or imaging studies may be done to determine the cause of the infections. How is this treated? Treatment for this condition often includes a combination of two or more of the following:  Antibiotic medicine.  Other medicines to treat less common causes of UTI.  Over-the-counter medicines to treat pain.  Drinking enough water to stay hydrated.  Follow these instructions at home:  Take over-the-counter and prescription medicines only as told by your health care provider.  If you were prescribed an antibiotic, take it as told by your health care provider. Do not stop taking the antibiotic even if you start to feel better.  Avoid alcohol, caffeine, tea, and carbonated beverages. They can irritate your bladder.  Drink enough fluid to keep your urine clear or pale yellow.  Keep all follow-up visits as told by your health care provider. This is important.  Make sure to: ? Empty your bladder often and completely. Do not hold urine for long periods of time. ? Empty your bladder before and after sex. ? Wipe from front to back after a bowel movement if you are female. Use each tissue one time when you wipe. Contact a health care  provider if:  You have back pain.  You have a fever.  You feel nauseous or vomit.  Your symptoms do not get better after 3 days.  Your symptoms go away and then return. Get help right away if:  You have severe back pain or lower abdominal pain.  You are vomiting and cannot keep down any medicines or water. This information is not intended to replace advice given to you by your health care provider.  Make sure you discuss any questions you have with your health care provider. Document Released: 07/20/2005 Document Revised: 03/23/2016 Document Reviewed: 08/31/2015 Elsevier Interactive Patient Education  2017 [employer].

## 2017-05-02 NOTE — Assessment & Plan Note (Addendum)
Urine dip shows an infection and with her other symptoms she likely has pyelonephritis Urine culture pending Cannot rule out a kidney stone and she will need further imaging if her symptoms do not improve quickly CMP, CBC reviewed from emergency room Cipro 500 mg twice daily 5 days Antinausea medication prescribed Continue increased fluids Call if no improvement in 24 hours

## 2017-05-02 NOTE — ED Triage Notes (Signed)
Called  No response from lobby 

## 2017-05-02 NOTE — Progress Notes (Signed)
Subjective:    Patient ID: Lori [name], female    DOB: Mar 15, 1951, 66 y.o.   MRN: [id number]  HPI She is here for an acute visit.   Her symptoms started three days ago.  Her right side of her abdomen ached initially, but that has improved.  She started having nausea and vomiting that night.  She started having a fever that night or the next day and she is unsure which.  Her whole body aches.    She has a decreased appetite, fatigue and has had some mild dysuria. She denies any coughing, wheezing, shortness of breath, cold symptoms, headaches and lightheadedness. She denies diarrhea, constipation, blood in the stool. She did go to the emergency room and they did do blood work, but she left before being seen because she felt so miserable.  She is eating ice and popcicles.  She is drinking water.  She is taking tylenol.   Medications and allergies reviewed with patient and updated if appropriate.  Patient Active Problem List   Diagnosis Date Noted  . Hyperlipidemia 04/27/2017  . Right hand pain 01/19/2017  . Personal history of colonic polyps 06/06/2014  . Family history of colon cancer 06/06/2014  . Osteopenia 11/04/2013  . Neuropathy of hand 04/28/2013    Current Outpatient Prescriptions on File Prior to Visit  Medication Sig Dispense Refill  . CALCIUM-VITAMIN D PO Take by mouth.    . Cyanocobalamin (VITAMIN B-12 PO) Take by mouth.    . Multiple Vitamins-Minerals (CENTRUM ULTRA WOMENS PO) Take by mouth.     No current facility-administered medications on file prior to visit.     Past Medical History:  Diagnosis Date  . CTS (carpal tunnel syndrome)    chronic  . Depression 01/16/2013  . Osteopenia   . PONV (postoperative nausea and vomiting)   . Tubular adenoma     Past Surgical History:  Procedure Laterality Date  . ABDOMINAL HYSTERECTOMY  1994   fibroids  . APPENDECTOMY    . BUNIONECTOMY Right   . BUNIONECTOMY Left 2018  . CHOLECYSTECTOMY    . COLONOSCOPY   12/09/2008   Dr.Rourk- normal rectum, polyp at the hepatic flexure bx= tubular adenoma. the remainder of the colonic mucosa and terminal ileum mucosa appeared normal.  . COLONOSCOPY  2006   Dr.Sam [place]- colonic adenoma  . COLONOSCOPY N/A 06/25/2014   Procedure: COLONOSCOPY;  Surgeon: Daneil Dolin, MD;  Location: [facility];  Service: Endoscopy;  Laterality: N/A;  1030 DR REQUEST TIME  . KNEE ARTHROSCOPY Right    torn meniscus  . ORIF TOE FRACTURE Right   . rotator Cuff surgery    . TUBAL LIGATION      Social History   Social History  . Marital status: Widowed    Spouse name: N/A  . Number of children: 2  . Years of education: N/A   Social History Main Topics  . Smoking status: Former Smoker    Packs/day: 1.00    Years: 10.00    Types: Cigarettes    Start date: 04/13/1971    Quit date: 11/01/1993  . Smokeless tobacco: Never Used  . Alcohol use 0.0 oz/week     Comment: wine  . Drug use: No  . Sexual activity: Not Currently    Birth control/ protection: Surgical   Other Topics Concern  . Not on file   Social History Narrative   Exercising regularly - senior exercises, walking    Family History  Problem  Relation Age of Onset  . Bipolar disorder Sister   . Schizophrenia Sister   . Kidney disease Sister   . Hypertension Sister   . Bipolar disorder Sister   . Kidney disease Sister   . Hypertension Mother   . Mental illness Mother   . Dementia Mother   . Cancer Father 68       colon; bladder  . Hypertension Father   . Heart disease Father   . Arthritis Father   . Bipolar disorder Son     Review of Systems  Constitutional: Positive for appetite change (decreased), diaphoresis, fatigue and fever.  HENT: Negative for congestion, ear pain, sinus pain and sore throat.   Respiratory: Negative for cough, shortness of breath and wheezing.   Cardiovascular: Negative for chest pain, palpitations and leg swelling.  Gastrointestinal: Positive for abdominal pain (RLQ  pain), nausea and vomiting (last vomited two nights ago). Negative for blood in stool, constipation and diarrhea.  Genitourinary: Positive for dysuria. Negative for hematuria.  Musculoskeletal: Positive for myalgias.  Neurological: Negative for light-headedness and headaches.       Objective:   Vitals:   05/02/17 1607  BP: 130/60  Pulse: (!) 111  Resp: 20  Temp: (!) 100.7 F (38.2 C)   Filed Weights   05/02/17 1607  Weight: 170 lb (77.1 kg)   Body mass index is 28.29 kg/m.  Wt Readings from Last 3 Encounters:  05/02/17 170 lb (77.1 kg)  04/27/17 168 lb (76.2 kg)  01/19/17 176 lb (79.8 kg)     Physical Exam GENERAL APPEARANCE: Appears stated age, well appearing, NAD EYES: conjunctiva clear, no icterus HEENT: bilateral tympanic membranes and ear canals normal, oropharynx with no erythema, no thyromegaly, trachea midline, no cervical or supraclavicular lymphadenopathy LUNGS: Clear to auscultation without wheeze or crackles, unlabored breathing, good air entry bilaterally HEART: Normal S1,S2 without murmurs ABDOMEN:  Soft, non distended, non tender, no flank pain EXTREMITIES: Without clubbing, cyanosis, or edema        Assessment & Plan:   See Problem List for Assessment and Plan of chronic medical problems.

## 2017-05-04 ENCOUNTER — Telehealth: Payer: Self-pay | Admitting: Emergency Medicine

## 2017-05-04 ENCOUNTER — Emergency Department (HOSPITAL_COMMUNITY)
Admission: EM | Admit: 2017-05-04 | Discharge: 2017-05-04 | Disposition: A | Discharge status: Home / Self Care | Payer: Federal, State, Local not specified - PPO | Attending: Emergency Medicine | Admitting: Emergency Medicine

## 2017-05-04 ENCOUNTER — Encounter (HOSPITAL_COMMUNITY): Payer: Self-pay | Admitting: Emergency Medicine

## 2017-05-04 ENCOUNTER — Emergency Department (HOSPITAL_COMMUNITY): Payer: Federal, State, Local not specified - PPO

## 2017-05-04 DIAGNOSIS — Z87891 Personal history of nicotine dependence: Secondary | ICD-10-CM | POA: Diagnosis not present

## 2017-05-04 DIAGNOSIS — R109 Unspecified abdominal pain: Secondary | ICD-10-CM | POA: Diagnosis present

## 2017-05-04 DIAGNOSIS — N12 Tubulo-interstitial nephritis, not specified as acute or chronic: Secondary | ICD-10-CM

## 2017-05-04 DIAGNOSIS — Z79899 Other long term (current) drug therapy: Secondary | ICD-10-CM | POA: Diagnosis not present

## 2017-05-04 HISTORY — DX: Urinary tract infection, site not specified: N39.0

## 2017-05-04 LAB — COMPREHENSIVE METABOLIC PANEL
ALBUMIN: 3.8 g/dL (ref 3.5–5.0)
ALK PHOS: 141 U/L — AB (ref 38–126)
ALT: 50 U/L (ref 14–54)
AST: 43 U/L — AB (ref 15–41)
Anion gap: 10 (ref 5–15)
BUN: 9 mg/dL (ref 6–20)
CALCIUM: 8.9 mg/dL (ref 8.9–10.3)
CHLORIDE: 102 mmol/L (ref 101–111)
CO2: 27 mmol/L (ref 22–32)
CREATININE: 0.97 mg/dL (ref 0.44–1.00)
GFR calc non Af Amer: >60 mL/min (ref >60)
GLUCOSE: 114 mg/dL — AB (ref 65–99)
Potassium: 3.7 mmol/L (ref 3.5–5.1)
SODIUM: 139 mmol/L (ref 135–145)
Total Bilirubin: 0.9 mg/dL (ref 0.3–1.2)
Total Protein: 7.5 g/dL (ref 6.5–8.1)

## 2017-05-04 LAB — URINALYSIS, ROUTINE W REFLEX MICROSCOPIC
Bilirubin Urine: NEGATIVE
GLUCOSE, UA: NEGATIVE mg/dL
KETONES UR: 5 mg/dL — AB
Nitrite: NEGATIVE
PH: 5 (ref 5.0–8.0)
PROTEIN: 30 mg/dL — AB
Specific Gravity, Urine: 1.009 (ref 1.005–1.030)

## 2017-05-04 LAB — CBC
HCT: 36.4 % (ref 36.0–46.0)
Hemoglobin: 13.2 g/dL (ref 12.0–15.0)
MCH: 31.5 pg (ref 26.0–34.0)
MCHC: 36.3 g/dL — AB (ref 30.0–36.0)
MCV: 86.9 fL (ref 78.0–100.0)
PLATELETS: 229 10*3/uL (ref 150–400)
RBC: 4.19 MIL/uL (ref 3.87–5.11)
RDW: 13.3 % (ref 11.5–15.5)
WBC: 5.9 10*3/uL (ref 4.0–10.5)

## 2017-05-04 LAB — LIPASE, BLOOD

## 2017-05-04 LAB — I-STAT CG4 LACTIC ACID, ED: Lactic Acid, Venous: 0.66 mmol/L (ref 0.5–1.9)

## 2017-05-04 MED ORDER — PROMETHAZINE HCL 25 MG PO TABS: 25.0000 mg | ORAL_TABLET | Freq: Four times a day (QID) | ORAL | 0 refills | Status: DC | PRN

## 2017-05-04 MED ORDER — DEXTROSE 5 % IV SOLN
1.0000 g | Freq: Once | INTRAVENOUS | Status: AC
  Administered 2017-05-04: 1 g via INTRAVENOUS
  Filled 2017-05-04: qty 10

## 2017-05-04 MED ORDER — PROMETHAZINE HCL 25 MG/ML IJ SOLN
12.5000 mg | Freq: Once | INTRAMUSCULAR | Status: AC
  Administered 2017-05-04: 12.5 mg via INTRAVENOUS
  Filled 2017-05-04: qty 1

## 2017-05-04 MED ORDER — SULFAMETHOXAZOLE-TRIMETHOPRIM 800-160 MG PO TABS: 1.0000 | ORAL_TABLET | Freq: Two times a day (BID) | ORAL | 0 refills | Status: DC

## 2017-05-04 MED ORDER — IOPAMIDOL (ISOVUE-300) INJECTION 61%
INTRAVENOUS | Status: AC
  Administered 2017-05-04: 100 mL via INTRAVENOUS
  Filled 2017-05-04: qty 100

## 2017-05-04 MED ORDER — IOPAMIDOL (ISOVUE-300) INJECTION 61%
100.0000 mL | Freq: Once | INTRAVENOUS | Status: AC | PRN
  Administered 2017-05-04: 100 mL via INTRAVENOUS

## 2017-05-04 MED ORDER — SODIUM CHLORIDE 0.9 % IV BOLUS (SEPSIS)
1000.0000 mL | Freq: Once | INTRAVENOUS | Status: AC
  Administered 2017-05-04: 1000 mL via INTRAVENOUS

## 2017-05-04 NOTE — ED Triage Notes (Signed)
Pt has been treated for UTI and kidney infection x 5 days. Reports no relief of symptoms, Chills, fever, NV and pain on urination Pt stated that she was sent from PCP today. for continued care. Pt stated that her PCP wants her to have IV antibiotics and a CT.

## 2017-05-04 NOTE — ED Provider Notes (Signed)
[facility] DEPT Provider Note   CSN: [id number] Arrival date & time: 05/04/17  1012     History   Chief Complaint Chief Complaint  Patient presents with  . Recurrent UTI  . Abdominal Cramping  . Nausea  . Emesis  . Flank Pain    HPI Lori [name] is a 66 y.o. female.  HPI   66 year old female with history of recurrent urinary tract infection, tubular adenoma and depression presenting for evaluation of dysuria. Patient report for the past 5 days she has fever, chills, right-sided abdominal pain, burning urination with frequency and urgency. She is nauseous, vomiting up bilious content. Her vomiting has improved but nausea still persists. She was seen by her PCP 3 days ago for her complaint. She was diagnosed with a urinary tract infection and was prescribed ciprofloxacin and antinausea medication. She has been taking her medication as prescribed but states it provided no relief. She went to her PCP today and was recommended to come to the ER for further evaluation of her condition. She was told that she may need IV antibiotic and abdominal pelvis CT scan for further evaluation. She has prior cholecystectomy and appendectomy. She has normal bowel movement, denies chest pain, short of breath, or productive cough. No history of kidney stones.  Past Medical History:  Diagnosis Date  . CTS (carpal tunnel syndrome)    chronic  . Depression 01/16/2013  . Osteopenia   . PONV (postoperative nausea and vomiting)   . Tubular adenoma   . UTI (urinary tract infection)     Patient Active Problem List   Diagnosis Date Noted  . Dysuria 05/02/2017  . Fever 05/02/2017  . Pyelonephritis 05/02/2017  . Hyperlipidemia 04/27/2017  . Right hand pain 01/19/2017  . Personal history of colonic polyps 06/06/2014  . Family history of colon cancer 06/06/2014  . Osteopenia 11/04/2013  . Neuropathy of hand 04/28/2013    Past Surgical History:  Procedure Laterality Date  . ABDOMINAL HYSTERECTOMY   1994   fibroids  . APPENDECTOMY    . BUNIONECTOMY Right   . BUNIONECTOMY Left 2018  . CHOLECYSTECTOMY    . COLONOSCOPY  12/09/2008   Dr.Rourk- normal rectum, polyp at the hepatic flexure bx= tubular adenoma. the remainder of the colonic mucosa and terminal ileum mucosa appeared normal.  . COLONOSCOPY  2006   Dr.Sam Morse- colonic adenoma  . COLONOSCOPY N/A 06/25/2014   Procedure: COLONOSCOPY;  Surgeon: Daneil Dolin, MD;  Location: [facility];  Service: Endoscopy;  Laterality: N/A;  1030 DR REQUEST TIME  . KNEE ARTHROSCOPY Right    torn meniscus  . ORIF TOE FRACTURE Right   . rotator Cuff surgery    . TUBAL LIGATION      OB History    No data available       Home Medications    Prior to Admission medications   Medication Sig Start Date End Date Taking? Authorizing Provider  CALCIUM-VITAMIN D PO Take by mouth.    [provider]  ciprofloxacin (CIPRO) 500 MG tablet Take 1 tablet (500 mg total) by mouth 2 (two) times daily. 05/02/17   Binnie Rail, MD  Cyanocobalamin (VITAMIN B-12 PO) Take by mouth.    [provider]  Multiple Vitamins-Minerals (CENTRUM ULTRA WOMENS PO) Take by mouth.    [provider]  ondansetron (ZOFRAN ODT) 4 MG disintegrating tablet Take 1 tablet (4 mg total) by mouth every 8 (eight) hours as needed for nausea or vomiting. 05/02/17  Binnie Rail, MD    Family History Family History  Problem Relation Age of Onset  . Bipolar disorder Sister   . Schizophrenia Sister   . Kidney disease Sister   . Hypertension Sister   . Bipolar disorder Sister   . Kidney disease Sister   . Hypertension Mother   . Mental illness Mother   . Dementia Mother   . Cancer Father 70       colon; bladder  . Hypertension Father   . Heart disease Father   . Arthritis Father   . Bipolar disorder Son     Social History Social History  Substance Use Topics  . Smoking status: Former Smoker    Packs/day: 1.00    Years: 10.00    Types:  Cigarettes    Start date: 04/13/1971    Quit date: 11/01/1993  . Smokeless tobacco: Never Used  . Alcohol use 0.0 oz/week     Comment: wine     Allergies   Codeine; Penicillins; and Tramadol   Review of Systems Review of Systems  All other systems reviewed and are negative.    Physical Exam Updated Vital Signs BP 134/73   Pulse (!) 104   Temp 99.3 F (37.4 C) (Oral)   Resp 15   SpO2 100%   Physical Exam  Constitutional: She appears well-developed and well-nourished. No distress.  HENT:  Head: Atraumatic.  Eyes: Conjunctivae are normal.  Neck: Neck supple.  Neurological: She is alert.  Skin: No rash noted.  Psychiatric: She has a normal mood and affect.  Nursing note and vitals reviewed.    ED Treatments / Results  Labs (all labs ordered are listed, but only abnormal results are displayed) Labs Reviewed  LIPASE, BLOOD - Abnormal; Notable for the following:       Result Value   Lipase <10 (*)    All other components within normal limits  COMPREHENSIVE METABOLIC PANEL - Abnormal; Notable for the following:    Glucose, Bld 114 (*)    AST 43 (*)    Alkaline Phosphatase 141 (*)    All other components within normal limits  CBC - Abnormal; Notable for the following:    MCHC 36.3 (*)    All other components within normal limits  URINALYSIS, ROUTINE W REFLEX MICROSCOPIC - Abnormal; Notable for the following:    APPearance HAZY (*)    Hgb urine dipstick MODERATE (*)    Ketones, ur 5 (*)    Protein, ur 30 (*)    Leukocytes, UA MODERATE (*)    Bacteria, UA MANY (*)    Squamous Epithelial / LPF 0-5 (*)    All other components within normal limits  URINE CULTURE  I-STAT CG4 LACTIC ACID, ED    EKG  EKG Interpretation None       Radiology Ct Abdomen Pelvis W Contrast  Result Date: 05/04/2017 CLINICAL DATA:  Urinary tract infection. Pyelonephritis. Symptoms not improving. Chills and fever. Right-sided abdominal pain. EXAM: CT ABDOMEN AND PELVIS WITH  CONTRAST TECHNIQUE: Multidetector CT imaging of the abdomen and pelvis was performed using the standard protocol following bolus administration of intravenous contrast. CONTRAST:  100 cc Isovue-300 COMPARISON:  None. FINDINGS: Lower chest: Normal Hepatobiliary: Previous cholecystectomy. 5 mm cyst right lobe of the liver. No significant parenchymal finding. Pancreas: Normal Spleen: Normal Adrenals/Urinary Tract: Adrenal glands are normal. No evidence of renal mass or hydronephrosis. There may be very mild edema in the appear renal fat on the right that could  go along with pyelonephritis. No areas of poorly functioning parenchymal tissue or abscess. Bladder appears normal. Stomach/Bowel: Normal.  Previous appendectomy. Vascular/Lymphatic: Normal except for some atherosclerotic calcification in the right common iliac artery. Reproductive: Previous hysterectomy.  No pelvic mass. Other: No free fluid or air. Musculoskeletal: Chronic spinal degenerative changes, most advanced on the left at L3-4 and on the right at L5-S1. IMPRESSION: Minimal edema in the perirectal fat on the right that could go along with mild pyelonephritis. No evidence of lobar nephronia or abscess. No obstruction. Previous appendectomy, hysterectomy and cholecystectomy. Electronically Signed   By: Nelson Chimes M.D.   On: 05/04/2017 13:54    Procedures Procedures (including critical care time)  Medications Ordered in ED Medications  cefTRIAXone (ROCEPHIN) 1 g in dextrose 5 % 50 mL IVPB (0 g Intravenous Stopped 05/04/17 1235)  promethazine (PHENERGAN) injection 12.5 mg (12.5 mg Intravenous Given 05/04/17 1156)  sodium chloride 0.9 % bolus 1,000 mL (0 mLs Intravenous Stopped 05/04/17 1424)  iopamidol (ISOVUE-300) 61 % injection 100 mL (100 mLs Intravenous Contrast Given 05/04/17 1308)     Initial Impression / Assessment and Plan / ED Course  I have reviewed the triage vital signs and the nursing notes.  Pertinent labs & imaging results that  were available during my care of the patient were reviewed by me and considered in my medical decision making (see chart for details).     BP 125/64   Pulse 92   Temp (!) 100.6 F (38.1 C) (Oral)   Resp 16   SpO2 100%    Final Clinical Impressions(s) / ED Diagnoses   Final diagnoses:  Pyelonephritis    New Prescriptions New Prescriptions   PROMETHAZINE (PHENERGAN) 25 MG TABLET    Take 1 tablet (25 mg total) by mouth every 6 (six) hours as needed for nausea.   SULFAMETHOXAZOLE-TRIMETHOPRIM (BACTRIM DS,SEPTRA DS) 800-160 MG TABLET    Take 1 tablet by mouth 2 (two) times daily.   11:33 AM Patient here with dysuria, fever and chills and back pain. Symptoms suggestive of pyelonephritis. The symptoms unrelieved despite being on Cipro. On exam she does have some mild right side abdomen tenderness but no CVA tenderness. Labs are reassuring. Will obtain CT scan however I have low suspicion for acute abdominal pathology. Rocephin given.  2:28 PM Patient has normal WBC at 5.9. Normal lactic acid of 0.66. Her electrolytes panel are reassuring. Her urine shows moderate leukocyte esterase, moderate hemoglobin on urine dipsticks and many bacteria. She has 6-30 WBC. CT of the abdomen and pelvis demonstrate minimal edema in the perirectal fat on the right that could go along with mild pyelonephritis. No other concerning feature. This finding is consistence with patient's symptoms. Plan to discharge patient with a different antibiotic. Urine culture sent.   Domenic Moras, PA-C 05/04/17 1441    Duffy Bruce, MD 05/05/17 [phone]

## 2017-05-04 NOTE — Telephone Encounter (Signed)
Pt came into office today and states that she is not getting any better and has not eaten since Saturday. Please advise.

## 2017-05-04 NOTE — Telephone Encounter (Signed)
Spoke with pt in person to inform. She agreed to go to the ED

## 2017-05-04 NOTE — Telephone Encounter (Signed)
She needs to go to the ED for further evaluation

## 2017-05-04 NOTE — ED Notes (Signed)
Discharge instructions reviewed with patient. Patient verbalizes understanding. VSS.   

## 2017-05-04 NOTE — ED Notes (Signed)
Patient transported to CT 

## 2017-05-05 LAB — URINE CULTURE

## 2017-05-06 LAB — URINE CULTURE: CULTURE: NO GROWTH

## 2017-05-08 NOTE — Progress Notes (Signed)
Subjective:    Patient ID: Lori [name], female    DOB: Feb 23, 1951, 66 y.o.   MRN: [id number]  HPI The patient is here for follow up from the ED.  She was here 7/10 with right abdominal pain, nausea, vomiting, fever, body aches, decreased appetite, fatigue and some mild dysuria.  She had gone to the ED the night prior and had blood work done, but left prior to being seen.  She was febrile here.  I diagnosed her with possible pyelonephritis and started her on Cipro and an antinausea medication.  She did not feel better two days later and I recommended ED evaluation. Her nausea had not improved.  Her urine still looked infected.  Her WBC was normal.  She received IV Rocephin, phenergan, IVF.  She had a Ct scan that showed probable early pyelonephritis.  She had a low grade fever.  They changed her cipro to bactrim but her culture returned and was resistant to cipro, so I advised she continue the cipro.    Her fever finally broke 4 days ago.  She has not had a fever since then.  Her energy level is good.  She denies dysuria, hematuria, increased urinary frequency and back pain.  She denies nausea and abdominal pain.    Her taste has not been normal.  Her tongue feels funny.  She thinks her appetite is better - things do not taste right.   Medications and allergies reviewed with patient and updated if appropriate.  Patient Active Problem List   Diagnosis Date Noted  . Dysuria 05/02/2017  . Fever 05/02/2017  . Pyelonephritis 05/02/2017  . Hyperlipidemia 04/27/2017  . Right hand pain 01/19/2017  . Personal history of colonic polyps 06/06/2014  . Family history of colon cancer 06/06/2014  . Osteopenia 11/04/2013  . Neuropathy of hand 04/28/2013    Current Outpatient Prescriptions on File Prior to Visit  Medication Sig Dispense Refill  . CALCIUM-VITAMIN D PO Take by mouth.    . Cyanocobalamin (VITAMIN B-12 PO) Take by mouth.    . Multiple Vitamins-Minerals (CENTRUM ULTRA WOMENS PO) Take  by mouth.    . promethazine (PHENERGAN) 25 MG tablet Take 1 tablet (25 mg total) by mouth every 6 (six) hours as needed for nausea. 20 tablet 0  . sulfamethoxazole-trimethoprim (BACTRIM DS,SEPTRA DS) 800-160 MG tablet Take 1 tablet by mouth 2 (two) times daily. 10 tablet 0   No current facility-administered medications on file prior to visit.     Past Medical History:  Diagnosis Date  . CTS (carpal tunnel syndrome)    chronic  . Depression 01/16/2013  . Osteopenia   . PONV (postoperative nausea and vomiting)   . Tubular adenoma   . UTI (urinary tract infection)     Past Surgical History:  Procedure Laterality Date  . ABDOMINAL HYSTERECTOMY  1994   fibroids  . APPENDECTOMY    . BUNIONECTOMY Right   . BUNIONECTOMY Left 2018  . CHOLECYSTECTOMY    . COLONOSCOPY  12/09/2008   Dr.Rourk- normal rectum, polyp at the hepatic flexure bx= tubular adenoma. the remainder of the colonic mucosa and terminal ileum mucosa appeared normal.  . COLONOSCOPY  2006   Dr.Sam [place]- colonic adenoma  . COLONOSCOPY N/A 06/25/2014   Procedure: COLONOSCOPY;  Surgeon: Daneil Dolin, MD;  Location: [facility];  Service: Endoscopy;  Laterality: N/A;  1030 DR REQUEST TIME  . KNEE ARTHROSCOPY Right    torn meniscus  . ORIF TOE FRACTURE Right   .  rotator Cuff surgery    . TUBAL LIGATION      Social History   Social History  . Marital status: Widowed    Spouse name: N/A  . Number of children: 2  . Years of education: N/A   Social History Main Topics  . Smoking status: Former Smoker    Packs/day: 1.00    Years: 10.00    Types: Cigarettes    Start date: 04/13/1971    Quit date: 11/01/1993  . Smokeless tobacco: Never Used  . Alcohol use 0.0 oz/week     Comment: wine  . Drug use: No  . Sexual activity: Not Currently    Birth control/ protection: Surgical   Other Topics Concern  . Not on file   Social History Narrative   Exercising regularly - senior exercises, walking    Family History    Problem Relation Age of Onset  . Bipolar disorder Sister   . Schizophrenia Sister   . Kidney disease Sister   . Hypertension Sister   . Bipolar disorder Sister   . Kidney disease Sister   . Hypertension Mother   . Mental illness Mother   . Dementia Mother   . Cancer Father 69       colon; bladder  . Hypertension Father   . Heart disease Father   . Arthritis Father   . Bipolar disorder Son     Review of Systems  Constitutional: Negative for appetite change, chills, fatigue and fever.  HENT:       Her tongue feels funny - feels like bees have stung it all over, altered taste  Gastrointestinal: Negative for abdominal pain and nausea.  Genitourinary: Negative for dysuria, frequency and hematuria.       Urine color normal  Neurological: Negative for light-headedness and headaches.       Objective:   Vitals:   05/09/17 1303  BP: 122/84  Pulse: 80  Resp: 16  Temp: 98.8 F (37.1 C)   Wt Readings from Last 3 Encounters:  05/09/17 166 lb (75.3 kg)  05/02/17 170 lb (77.1 kg)  04/27/17 168 lb (76.2 kg)   Body mass index is 27.62 kg/m.   Physical Exam    Constitutional: Appears well-developed and well-nourished. No distress.  HENT:  Head: Normocephalic and atraumatic.  Mouth: tongue slightly thickened, with white coating, no oral lesions Abdomen: soft, non tender, non distended Back: no CVA tenderness Skin: Skin is warm and dry. Not diaphoretic.  Psychiatric: Normal mood and affect. Behavior is normal.     Assessment & Plan:    See Problem List for Assessment and Plan of chronic medical problems.

## 2017-05-08 NOTE — Patient Instructions (Addendum)
Give a urine sample down in the lab.   Test(s) ordered today. Your results will be released to [facility] (or called to you) after review, usually within 72hours after test completion. If any changes need to be made, you will be notified at that same time.   Medications reviewed and updated.  Changes include using nystatin for your thrush.  Your prescription(s) have been submitted to your pharmacy. Please take as directed and contact our office if you believe you are having problem(s) with the medication(s).    Oral Thrush, Adult Oral thrush, also called oral candidiasis, is a fungal infection that develops in the mouth and throat and on the tongue. It causes white patches to form on the mouth and tongue. Ritta Slot is most common in older adults, but it can occur at any age. Many cases of thrush are mild, but this infection can also be serious. Ritta Slot can be a repeated (recurrent) problem for certain people who have a weak body defense system (immune system). The weakness can be caused by chronic illnesses, or by taking medicines that limit the body's ability to fight infection. If a person has difficulty fighting infection, the fungus that causes thrush can spread through the body. This can cause life-threatening blood or organ infections. What are the causes? This condition is caused by a fungus (yeast) called Candida albicans.  This fungus is normally present in small amounts in the mouth and on other mucous membranes. It usually causes no harm.  If conditions are present that allow the fungus to grow without control, it invades surrounding tissues and becomes an infection.  Other Candida species can also lead to thrush (rare).  What increases the risk? This condition is more likely to develop in:  People with a weakened immune system.  Older adults.  People with HIV (human immunodeficiency virus).  People with diabetes.  People with dry mouth (xerostomia).  Pregnant women.  People  with poor dental care, especially people who have false teeth.  People who use antibiotic medicines.  What are the signs or symptoms? Symptoms of this condition can vary from mild and moderate to severe and persistent. Symptoms may include:  A burning feeling in the mouth and throat. This can occur at the start of a thrush infection.  White patches that stick to the mouth and tongue. The tissue around the patches may be red, raw, and painful. If rubbed (during tooth brushing, for example), the patches and the tissue of the mouth may bleed easily.  A bad taste in the mouth or difficulty tasting foods.  A cottony feeling in the mouth.  Pain during eating and swallowing.  Poor appetite.  Cracking at the corners of the mouth.  How is this diagnosed? This condition is diagnosed based on:  Physical exam. Your health care provider will look in your mouth.  Health history. Your health care provider will ask you questions about your health.  How is this treated? This condition is treated with medicines called antifungals, which prevent the growth of fungi. These medicines are either applied directly to the affected area (topical) or swallowed (oral). The treatment will depend on the severity of the condition. Mild thrush Mild cases of thrush may clear up with the use of an antifungal mouth rinse or lozenges. Treatment usually lasts about 14 days. Moderate to severe thrush  More severe thrush infections that have spread to the esophagus are treated with an oral antifungal medicine. A topical antifungal medicine may also be used.  For some severe infections, treatment may need to continue for more than 14 days.  Oral antifungal medicines are rarely used during pregnancy because they may be harmful to the unborn child. If you are pregnant, talk with your health care provider about options for treatment. Persistent or recurrent thrush For cases of thrush that do not go away or keep coming  back:  Treatment may be needed twice as long as the symptoms last.  Treatment will include both oral and topical antifungal medicines.  People with a weakened immune system can take an antifungal medicine on a continuous basis to prevent thrush infections.  It is important to treat conditions that make a person more likely to get thrush, such as diabetes or HIV. Follow these instructions at home: Medicines  Take over-the-counter and prescription medicines only as told by your health care provider.  Talk with your health care provider about an over-the-counter medicine called gentian violet, which kills bacteria and fungi. Relieving soreness and discomfort To help reduce the discomfort of thrush:  Drink cold liquids such as water or iced tea.  Try flavored ice treats or frozen juices.  Eat foods that are easy to swallow, such as gelatin, ice cream, or custard.  Try drinking from a straw if the patches in your mouth are painful.  General instructions  Eat plain, unflavored yogurt as directed by your health care provider. Check the label to make sure the yogurt contains live cultures. This yogurt can help healthy bacteria to grow in the mouth and can stop the growth of the fungus that causes thrush.  If you wear dentures, remove the dentures before going to bed, brush them vigorously, and soak them in a cleaning solution as directed by your health care provider.  Rinse your mouth with a warm salt-water mixture several times a day. To make a salt-water mixture, completely dissolve 1/2-1 tsp of salt in 1 cup of warm water. Contact a health care provider if:  Your symptoms are getting worse or are not improving within 7 days of starting treatment.  You have symptoms of a spreading infection, such as white patches on the skin outside of the mouth. This information is not intended to replace advice given to you by your health care provider. Make sure you discuss any questions you have  with your health care provider. Document Released: 07/05/2004 Document Revised: 07/04/2016 Document Reviewed: 07/04/2016 Elsevier Interactive Patient Education  2017 [employer].

## 2017-05-09 ENCOUNTER — Other Ambulatory Visit (INDEPENDENT_AMBULATORY_CARE_PROVIDER_SITE_OTHER): Payer: Federal, State, Local not specified - PPO

## 2017-05-09 ENCOUNTER — Ambulatory Visit (INDEPENDENT_AMBULATORY_CARE_PROVIDER_SITE_OTHER): Payer: Federal, State, Local not specified - PPO | Admitting: Internal Medicine

## 2017-05-09 ENCOUNTER — Encounter: Payer: Self-pay | Admitting: Internal Medicine

## 2017-05-09 VITALS — BP 122/84 | HR 80 | Temp 98.8°F | Resp 16 | Wt 166.0 lb

## 2017-05-09 DIAGNOSIS — N12 Tubulo-interstitial nephritis, not specified as acute or chronic: Secondary | ICD-10-CM

## 2017-05-09 DIAGNOSIS — B370 Candidal stomatitis: Secondary | ICD-10-CM | POA: Diagnosis not present

## 2017-05-09 LAB — URINALYSIS, ROUTINE W REFLEX MICROSCOPIC
BILIRUBIN URINE: NEGATIVE
KETONES UR: NEGATIVE
NITRITE: NEGATIVE
PH: 6 (ref 5.0–8.0)
Specific Gravity, Urine: 1.01 (ref 1.000–1.030)
TOTAL PROTEIN, URINE-UPE24: NEGATIVE
URINE GLUCOSE: NEGATIVE
Urobilinogen, UA: 0.2 (ref 0.0–1.0)

## 2017-05-09 MED ORDER — NYSTATIN 100000 UNIT/ML MT SUSP: 5.0000 mL | Freq: Four times a day (QID) | OROMUCOSAL | 0 refills | Status: DC

## 2017-05-09 NOTE — Assessment & Plan Note (Signed)
Resolved - no symptoms currently suggestive of UTI/pyelo Will recheck UA, UCx Discussed she may be at risk for future UTI's and the symptoms may be subtle

## 2017-05-09 NOTE — Assessment & Plan Note (Signed)
Probable thrush with tongue feeling funny and altered taste Nystatin x 10 days

## 2017-05-11 LAB — URINE CULTURE: Organism ID, Bacteria: NO GROWTH

## 2017-08-04 ENCOUNTER — Encounter: Payer: Self-pay | Admitting: Internal Medicine

## 2017-08-04 ENCOUNTER — Other Ambulatory Visit: Payer: Federal, State, Local not specified - PPO

## 2017-08-04 ENCOUNTER — Ambulatory Visit (INDEPENDENT_AMBULATORY_CARE_PROVIDER_SITE_OTHER): Payer: Federal, State, Local not specified - PPO | Admitting: Internal Medicine

## 2017-08-04 VITALS — BP 140/60 | HR 110 | Temp 100.4°F | Ht 65.0 in | Wt 169.5 lb

## 2017-08-04 DIAGNOSIS — N12 Tubulo-interstitial nephritis, not specified as acute or chronic: Secondary | ICD-10-CM

## 2017-08-04 LAB — POC URINALSYSI DIPSTICK (AUTOMATED)
Bilirubin, UA: NEGATIVE
GLUCOSE UA: NEGATIVE
Ketones, UA: NEGATIVE
NITRITE UA: NEGATIVE
PH UA: 6 (ref 5.0–8.0)
Protein, UA: POSITIVE
RBC UA: POSITIVE
Spec Grav, UA: 1.015 (ref 1.010–1.025)
UROBILINOGEN UA: 0.2 U/dL

## 2017-08-04 MED ORDER — CIPROFLOXACIN HCL 500 MG PO TABS: 500.0000 mg | ORAL_TABLET | Freq: Two times a day (BID) | ORAL | 0 refills | Status: DC

## 2017-08-04 MED ORDER — CEFTRIAXONE SODIUM 1 G IJ SOLR
1.0000 g | Freq: Once | INTRAMUSCULAR | Status: AC
  Administered 2017-08-04: 1 g via INTRAMUSCULAR

## 2017-08-04 NOTE — Patient Instructions (Signed)
You received an antibiotic injection here today.   Start the oral antibiotics tomorrow.   Drink plenty of water.  Take tylenol alternating with advil.      Urinary Tract Infection, Adult A urinary tract infection (UTI) is an infection of any part of the urinary tract, which includes the kidneys, ureters, bladder, and urethra. These organs make, store, and get rid of urine in the body. UTI can be a bladder infection (cystitis) or kidney infection (pyelonephritis). What are the causes? This infection may be caused by fungi, viruses, or bacteria. Bacteria are the most common cause of UTIs. This condition can also be caused by repeated incomplete emptying of the bladder during urination. What increases the risk? This condition is more likely to develop if:  You ignore your need to urinate or hold urine for long periods of time.  You do not empty your bladder completely during urination.  You wipe back to front after urinating or having a bowel movement, if you are female.  You are uncircumcised, if you are female.  You are constipated.  You have a urinary catheter that stays in place (indwelling).  You have a weak defense (immune) system.  You have a medical condition that affects your bowels, kidneys, or bladder.  You have diabetes.  You take antibiotic medicines frequently or for long periods of time, and the antibiotics no longer work well against certain types of infections (antibiotic resistance).  You take medicines that irritate your urinary tract.  You are exposed to chemicals that irritate your urinary tract.  You are female.  What are the signs or symptoms? Symptoms of this condition include:  Fever.  Frequent urination or passing small amounts of urine frequently.  Needing to urinate urgently.  Pain or burning with urination.  Urine that smells bad or unusual.  Cloudy urine.  Pain in the lower abdomen or back.  Trouble urinating.  Blood in the  urine.  Vomiting or being less hungry than normal.  Diarrhea or abdominal pain.  Vaginal discharge, if you are female.  How is this diagnosed? This condition is diagnosed with a medical history and physical exam. You will also need to provide a urine sample to test your urine. Other tests may be done, including:  Blood tests.  Sexually transmitted disease (STD) testing.  If you have had more than one UTI, a cystoscopy or imaging studies may be done to determine the cause of the infections. How is this treated? Treatment for this condition often includes a combination of two or more of the following:  Antibiotic medicine.  Other medicines to treat less common causes of UTI.  Over-the-counter medicines to treat pain.  Drinking enough water to stay hydrated.  Follow these instructions at home:  Take over-the-counter and prescription medicines only as told by your health care provider.  If you were prescribed an antibiotic, take it as told by your health care provider. Do not stop taking the antibiotic even if you start to feel better.  Avoid alcohol, caffeine, tea, and carbonated beverages. They can irritate your bladder.  Drink enough fluid to keep your urine clear or pale yellow.  Keep all follow-up visits as told by your health care provider. This is important.  Make sure to: ? Empty your bladder often and completely. Do not hold urine for long periods of time. ? Empty your bladder before and after sex. ? Wipe from front to back after a bowel movement if you are female. Use each tissue one  time when you wipe. Contact a health care provider if:  You have back pain.  You have a fever.  You feel nauseous or vomit.  Your symptoms do not get better after 3 days.  Your symptoms go away and then return. Get help right away if:  You have severe back pain or lower abdominal pain.  You are vomiting and cannot keep down any medicines or water. This information is not  intended to replace advice given to you by your health care provider. Make sure you discuss any questions you have with your health care provider. Document Released: 07/20/2005 Document Revised: 03/23/2016 Document Reviewed: 08/31/2015 Elsevier Interactive Patient Education  2017 [employer].

## 2017-08-04 NOTE — Assessment & Plan Note (Signed)
Symptoms and urine dip consistent with pyelonephritis Ceftriaxone 1 gm IM x 1 cipro 500 mg BID x 3 days starting tomorrow Continue increased fluids tylenol or advil alternating Call if no improvement

## 2017-08-04 NOTE — Progress Notes (Signed)
Subjective:    Patient ID: Lori [name], female    DOB: 1951/04/11, 66 y.o.   MRN: [id number]  HPI She is here for an acute visit.   She had her flu vaccine at [facility] 4 days ago.  Later that day she did not feel well.  Her symptoms have progressively worsened since then.  She has had chills, fatigue, decreased appetite, whole body aches, nausea and she does note some urinary changes - increased frequency, urine odor/dark urine.  She denies dysuria.  She has a fever here.     She had pyelonephritis in July and had very subtle symptoms.    Medications and allergies reviewed with patient and updated if appropriate.  Patient Active Problem List   Diagnosis Date Noted  . Thrush 05/09/2017  . Fever 05/02/2017  . Pyelonephritis 05/02/2017  . Hyperlipidemia 04/27/2017  . Right hand pain 01/19/2017  . Personal history of colonic polyps 06/06/2014  . Family history of colon cancer 06/06/2014  . Osteopenia 11/04/2013  . Neuropathy of hand 04/28/2013    Current Outpatient Prescriptions on File Prior to Visit  Medication Sig Dispense Refill  . CALCIUM-VITAMIN D PO Take by mouth.    . Cyanocobalamin (VITAMIN B-12 PO) Take by mouth.    . Multiple Vitamins-Minerals (CENTRUM ULTRA WOMENS PO) Take by mouth.    . nystatin (MYCOSTATIN) 100000 UNIT/ML suspension Take 5 mLs (500,000 Units total) by mouth 4 (four) times daily. Use for 10 days 200 mL 0   No current facility-administered medications on file prior to visit.     Past Medical History:  Diagnosis Date  . CTS (carpal tunnel syndrome)    chronic  . Depression 01/16/2013  . Osteopenia   . PONV (postoperative nausea and vomiting)   . Tubular adenoma   . UTI (urinary tract infection)     Past Surgical History:  Procedure Laterality Date  . ABDOMINAL HYSTERECTOMY  1994   fibroids  . APPENDECTOMY    . BUNIONECTOMY Right   . BUNIONECTOMY Left 2018  . CHOLECYSTECTOMY    . COLONOSCOPY  12/09/2008   Dr.Rourk- normal rectum,  polyp at the hepatic flexure bx= tubular adenoma. the remainder of the colonic mucosa and terminal ileum mucosa appeared normal.  . COLONOSCOPY  2006   Dr.Sam Ruhenstroth- colonic adenoma  . COLONOSCOPY N/A 06/25/2014   Procedure: COLONOSCOPY;  Surgeon: Daneil Dolin, MD;  Location: [facility];  Service: Endoscopy;  Laterality: N/A;  1030 DR REQUEST TIME  . KNEE ARTHROSCOPY Right    torn meniscus  . ORIF TOE FRACTURE Right   . rotator Cuff surgery    . TUBAL LIGATION      Social History   Social History  . Marital status: Widowed    Spouse name: N/A  . Number of children: 2  . Years of education: N/A   Social History Main Topics  . Smoking status: Former Smoker    Packs/day: 1.00    Years: 10.00    Types: Cigarettes    Start date: 04/13/1971    Quit date: 11/01/1993  . Smokeless tobacco: Never Used  . Alcohol use 0.0 oz/week     Comment: wine  . Drug use: No  . Sexual activity: Not Currently    Birth control/ protection: Surgical   Other Topics Concern  . None   Social History Narrative   Exercising regularly - senior exercises, walking    Family History  Problem Relation Age of Onset  . Bipolar  disorder Sister   . Schizophrenia Sister   . Kidney disease Sister   . Hypertension Sister   . Bipolar disorder Sister   . Kidney disease Sister   . Hypertension Mother   . Mental illness Mother   . Dementia Mother   . Cancer Father 42       colon; bladder  . Hypertension Father   . Heart disease Father   . Arthritis Father   . Bipolar disorder Son     Review of Systems  Constitutional: Positive for appetite change, chills, fatigue and fever.  HENT: Negative for congestion, sinus pain and sore throat.   Respiratory: Negative for cough, chest tightness, shortness of breath and wheezing.   Cardiovascular: Positive for chest pain (hurts to take breaths in / out - chest aches). Negative for palpitations and leg swelling.  Gastrointestinal: Positive for nausea. Negative  for abdominal pain.  Genitourinary: Positive for frequency (2-3 at night). Negative for difficulty urinating, dysuria and hematuria.       Urine smells, urine deep yellow  Musculoskeletal: Positive for myalgias (whole body aches).  Neurological: Negative for light-headedness and headaches.       Objective:   Vitals:   08/04/17 1039  BP: 140/60  Pulse: (!) 110  Temp: (!) 100.4 F (38 C)  SpO2: 98%   Filed Weights   08/04/17 1039  Weight: 169 lb 8 oz (76.9 kg)   Body mass index is 28.21 kg/m.  Wt Readings from Last 3 Encounters:  08/04/17 169 lb 8 oz (76.9 kg)  05/09/17 166 lb (75.3 kg)  05/02/17 170 lb (77.1 kg)     Physical Exam GENERAL APPEARANCE: mild ill appearing, NAD EYES: conjunctiva clear, no icterus HEENT: bilateral tympanic membranes and ear canals normal, oropharynx with mild erythema, no thyromegaly, trachea midline, no cervical or supraclavicular lymphadenopathy LUNGS: Clear to auscultation without wheeze or crackles, unlabored breathing, good air entry bilaterally Cardiovascular: Normal S1,S2 without murmurs.  No Edema Abdomen: soft, non tender, non distended GU:  No cva tenderness Skin: dry, warm       Assessment & Plan:    See Problem List for Assessment and Plan of chronic medical problems.

## 2017-08-06 LAB — URINE CULTURE
MICRO NUMBER:: 81140039
SPECIMEN QUALITY:: ADEQUATE

## 2017-09-21 LAB — HM MAMMOGRAPHY

## 2017-09-28 ENCOUNTER — Encounter: Payer: Self-pay | Admitting: Internal Medicine

## 2017-10-23 ENCOUNTER — Encounter: Payer: Self-pay | Admitting: Physician Assistant

## 2017-10-23 ENCOUNTER — Other Ambulatory Visit: Payer: Self-pay

## 2017-10-23 ENCOUNTER — Ambulatory Visit: Payer: Self-pay

## 2017-10-23 ENCOUNTER — Ambulatory Visit: Payer: Federal, State, Local not specified - PPO | Admitting: Physician Assistant

## 2017-10-23 VITALS — BP 144/72 | HR 99 | Temp 98.7°F | Resp 18 | Ht 64.57 in | Wt 169.0 lb

## 2017-10-23 DIAGNOSIS — R509 Fever, unspecified: Secondary | ICD-10-CM | POA: Diagnosis not present

## 2017-10-23 DIAGNOSIS — R059 Cough, unspecified: Secondary | ICD-10-CM

## 2017-10-23 DIAGNOSIS — J101 Influenza due to other identified influenza virus with other respiratory manifestations: Secondary | ICD-10-CM | POA: Diagnosis not present

## 2017-10-23 DIAGNOSIS — R05 Cough: Secondary | ICD-10-CM | POA: Diagnosis not present

## 2017-10-23 LAB — POC INFLUENZA A&B (BINAX/QUICKVUE)
INFLUENZA B, POC: NEGATIVE
Influenza A, POC: POSITIVE — AB

## 2017-10-23 MED ORDER — HYDROCODONE-HOMATROPINE 5-1.5 MG/5ML PO SYRP: 5.0000 mL | ORAL_SOLUTION | Freq: Three times a day (TID) | ORAL | 0 refills | Status: DC | PRN

## 2017-10-23 MED ORDER — OSELTAMIVIR PHOSPHATE 75 MG PO CAPS: 75.0000 mg | ORAL_CAPSULE | Freq: Two times a day (BID) | ORAL | 0 refills | Status: DC

## 2017-10-23 MED ORDER — BENZONATATE 100 MG PO CAPS: 100.0000 mg | ORAL_CAPSULE | Freq: Three times a day (TID) | ORAL | 0 refills | Status: DC | PRN

## 2017-10-23 NOTE — Progress Notes (Signed)
MRN: [id number] DOB: 06-Jun-1951  Subjective:   Lori [name] is a 66 y.o. female presenting for chief complaint of Cough (X 3 days ) and Hoarse (X 1 days) .  Reports 5 day history of illness. Started out with sore throat, then developed dry cough. 2 days ago it got worse, she had fever, generalized body aches, and chills. Has tried mucinex and robitussin with no full relief. She is now hoarse and has some rhinorrhea. Denies sinus pain, ear pain, wheezing, shortness of breath and chest pain, night sweats, nausea, vomiting, abdominal pain and diarrhea. Has not had sick contact with anyone. No history of seasonal allergies, no history of asthma or COPD. Patient has had flu shot this season. Former smoker, quit in 1995. Smoked 1 ppd x years.  Denies any other aggravating or relieving factors, no other questions or concerns.  Parnika has a current medication list which includes the following prescription(s): calcium-vitamin d, cyanocobalamin, multiple vitamins-minerals, benzonatate, ciprofloxacin, hydrocodone-homatropine, and oseltamivir. Also is allergic to codeine; penicillins; and tramadol.  Connor  has a past medical history of CTS (carpal tunnel syndrome), Depression (01/16/2013), Osteopenia, PONV (postoperative nausea and vomiting), Tubular adenoma, and UTI (urinary tract infection). Also  has a past surgical history that includes Appendectomy; Cholecystectomy; Tubal ligation; rotator Cuff surgery; Colonoscopy (12/09/2008); Colonoscopy (2006); Colonoscopy (N/A, 06/25/2014); Knee arthroscopy (Right); Bunionectomy (Right); ORIF toe fracture (Right); Abdominal hysterectomy (1994); and Bunionectomy (Left, 2018).   Objective:   Vitals: BP (!) 144/72 (BP Location: Right Arm, Patient Position: Sitting, Cuff Size: Normal)   Pulse 99   Temp 98.7 F (37.1 C) (Oral)   Resp 18   Ht 5' 4.57" (1.64 m)   Wt 169 lb (76.7 kg)   SpO2 99%   BMI 28.50 kg/m   Physical Exam  Constitutional: She is oriented to  person, place, and time. She appears well-developed and well-nourished. She appears distressed (appears like she does not feel well sitting on exam table).  HENT:  Head: Normocephalic and atraumatic.  Right Ear: External ear and ear canal normal. Tympanic membrane is retracted. Tympanic membrane is not erythematous and not bulging.  Left Ear: External ear and ear canal normal. Tympanic membrane is not erythematous, not retracted and not bulging.  Nose: Mucosal edema ( moderate bilaterally) present. No rhinorrhea. Right sinus exhibits no maxillary sinus tenderness and no frontal sinus tenderness. Left sinus exhibits no maxillary sinus tenderness and no frontal sinus tenderness.  Mouth/Throat: Uvula is midline, oropharynx is clear and moist and mucous membranes are normal. No tonsillar exudate.  Eyes: Conjunctivae are normal.  Neck: Normal range of motion.  Cardiovascular: Normal rate, regular rhythm and normal heart sounds.  Pulmonary/Chest: Effort normal and breath sounds normal. No stridor. No respiratory distress. She has no wheezes. She has no rhonchi. She has no rales.  Lymphadenopathy:       Head (right side): No submental, no submandibular, no tonsillar, no preauricular, no posterior auricular and no occipital adenopathy present.       Head (left side): No submental, no submandibular, no tonsillar, no preauricular, no posterior auricular and no occipital adenopathy present.    She has no cervical adenopathy.       Right: No supraclavicular adenopathy present.       Left: No supraclavicular adenopathy present.  Neurological: She is alert and oriented to person, place, and time.  Skin: Skin is warm and dry.  Psychiatric: She has a normal mood and affect.  Vitals reviewed.   Results for  orders placed or performed in visit on 10/23/17 (from the past 24 hour(s))  POC Influenza A&B(BINAX/QUICKVUE)     Status: Abnormal   Collection Time: 10/23/17  4:11 PM  Result Value Ref Range   Influenza  A, POC Positive (A) Negative   Influenza B, POC Negative Negative   Narrative   [facility] [address] [place] [address] Dept: [phone] Dept Fax: [phone] Loc: [place] Fax: [phone]     Assessment and Plan :  1. Fever, unspecified - POC Influenza A&B(BINAX/QUICKVUE) 2. Cough - POC Influenza A&B(BINAX/QUICKVUE) - benzonatate (TESSALON) 100 MG capsule; Take 1-2 capsules (100-200 mg total) by mouth 3 (three) times daily as needed for cough.  Dispense: 40 capsule; Refill: 0 - HYDROcodone-homatropine (HYCODAN) 5-1.5 MG/5ML syrup; Take 5 mLs by mouth every 8 (eight) hours as needed for cough.  Dispense: 120 mL; Refill: 0 3. Influenza A Pt appears like she does not feel well. Vitals stable. Lungs are CTAB. Point-of-care testing positive for influenza A.   Given educational material on influenza.Patient given prescription for Tamiflu.  Also given symptomatic treatment.  She is requesting Hycodan cough syrup.  She has had in the past and notes that he only thing that helps her sleep when she has a cough like she does now.  Codeine is listed as an allergy, but she notes she tolerates Hycodan very well and does not have any side effects.  Educated on potential adverse reactions.  Patient understands and agrees to treatment. - oseltamivir (TAMIFLU) 75 MG capsule; Take 1 capsule (75 mg total) by mouth 2 (two) times daily.  Dispense: 10 capsule; Refill: 0  Tenna Delaine, PA-C  [facility] [place] 10/23/2017 5:06 PM

## 2017-10-23 NOTE — Patient Instructions (Addendum)
You have tested positive for the flu, you are contagious until you are fever free for 24 hours without using tylenol or ibuprofen.Please stay out of work until you are no longer contagious. Two major complications after the flu are pneumonia and sinus infections. Please be aware of this and if you are not any better in 7-10 days or you develop worsening cough or sinus pressure, seek care at our clinic or the ED. Continue to wash your hands and wear a mask daily especially around other people.    Influenza, Adult Influenza ("the flu") is an infection in the lungs, nose, and throat (respiratory tract). It is caused by a virus. The flu causes many common cold symptoms, as well as a high fever and body aches. It can make you feel very sick. The flu spreads easily from person to person (is contagious). Getting a flu shot (influenza vaccination) every year is the best way to prevent the flu. Follow these instructions at home:  Take over-the-counter and prescription medicines only as told by your doctor.  Use a cool mist humidifier to add moisture (humidity) to the air in your home. This can make it easier to breathe.  Rest as needed.  Drink enough fluid to keep your pee (urine) clear or pale yellow.  Cover your mouth and nose when you cough or sneeze.  Wash your hands with soap and water often, especially after you cough or sneeze. If you cannot use soap and water, use hand sanitizer.  Stay home from work or school as told by your doctor. Unless you are visiting your doctor, try to avoid leaving home until your fever has been gone for 24 hours without the use of medicine.  Keep all follow-up visits as told by your doctor. This is important. How is this prevented?  Getting a yearly (annual) flu shot is the best way to avoid getting the flu. You may get the flu shot in late summer, fall, or winter. Ask your doctor when you should get your flu shot.  Wash your hands often or use hand sanitizer  often.  Avoid contact with people who are sick during cold and flu season.  Eat healthy foods.  Drink plenty of fluids.  Get enough sleep.  Exercise regularly. Contact a doctor if:  You get new symptoms.  You have: ? Chest pain. ? Watery poop (diarrhea). ? A fever.  Your cough gets worse.  You start to have more mucus.  You feel sick to your stomach (nauseous).  You throw up (vomit). Get help right away if:  You start to be short of breath or have trouble breathing.  Your skin or nails turn a bluish color.  You have very bad pain or stiffness in your neck.  You get a sudden headache.  You get sudden pain in your face or ear.  You cannot stop throwing up. This information is not intended to replace advice given to you by your health care provider. Make sure you discuss any questions you have with your health care provider. Document Released: 07/19/2008 Document Revised: 03/17/2016 Document Reviewed: 08/04/2015 Elsevier Interactive Patient Education  2017 [employer].    IF you received an x-ray today, you will receive an invoice from [facility]. Please contact [facility] at [phone] with questions or concerns regarding your invoice.   IF you received labwork today, you will receive an invoice from [facility]. Please contact [facility] at [phone] with questions or concerns regarding your invoice.   Our billing  staff will not be able to assist you with questions regarding bills from these companies.  You will be contacted with the lab results as soon as they are available. The fastest way to get your results is to activate your My Chart account. Instructions are located on the last page of this paperwork. If you have not heard from [place] regarding the results in 2 weeks, please contact this office.

## 2017-10-23 NOTE — Telephone Encounter (Signed)
   Reason for Disposition . [1] Continuous (nonstop) coughing interferes with work or school AND [2] no improvement using cough treatment per Care Advice  Answer Assessment - Initial Assessment Questions 1. ONSET: "When did the cough begin?"      Last Thursday 2. SEVERITY: "How bad is the cough today?"      Pretty bad 3. RESPIRATORY DISTRESS: "Describe your breathing."      Btreathing is ok 4. FEVER: "Do you have a fever?" If so, ask: "What is your temperature, how was it measured, and when did it start?"     No 5. SPUTUM: "Describe the color of your sputum" (clear, white, yellow, green)     Green 6. HEMOPTYSIS: "Are you coughing up any blood?" If so ask: "How much?" (flecks, streaks, tablespoons, etc.)     No 7. CARDIAC HISTORY: "Do you have any history of heart disease?" (e.g., heart attack, congestive heart failure)      No 8. LUNG HISTORY: "Do you have any history of lung disease?"  (e.g., pulmonary embolus, asthma, emphysema)      No 9. PE RISK FACTORS: "Do you have a history of blood clots?" (or: recent major surgery, recent prolonged travel, bedridden )     No 10. OTHER SYMPTOMS: "Do you have any other symptoms?" (e.g., runny nose, wheezing, chest pain)       Chest feels tight when coughing 11. PREGNANCY: "Is there any chance you are pregnant?" "When was your last menstrual period?"       No 12. TRAVEL: "Have you traveled out of the country in the last month?" (e.g., travel history, exposures)       No  Protocols used: [place]  Taking Mucinex with no relief. Fever on and off. Appointment made for today.

## 2017-10-27 ENCOUNTER — Ambulatory Visit: Payer: Federal, State, Local not specified - PPO | Admitting: Family Medicine

## 2017-10-27 ENCOUNTER — Encounter: Payer: Self-pay | Admitting: Family Medicine

## 2017-10-27 ENCOUNTER — Ambulatory Visit: Payer: Self-pay | Admitting: *Deleted

## 2017-10-27 VITALS — BP 138/84 | HR 78 | Temp 98.9°F | Ht 64.57 in | Wt 168.0 lb

## 2017-10-27 DIAGNOSIS — R51 Headache: Secondary | ICD-10-CM | POA: Diagnosis not present

## 2017-10-27 DIAGNOSIS — R519 Headache, unspecified: Secondary | ICD-10-CM | POA: Insufficient documentation

## 2017-10-27 NOTE — Patient Instructions (Signed)
Please try things such as zyrtec-D or allegra-D which is an antihistamine and decongestant.   Please try afrin which will help with nasal congestion but use for only three days.   Please also try using a netti pot on a regular occasion.  Honey can help with a sore throat.   Don't take any medicine tomorrow and see how you feel.   If you are still having a headache then you can try ibuprofen.   If a day after that, you're still having pain then you can take tylenol.   If you still having a headache then please follow up.

## 2017-10-27 NOTE — Progress Notes (Signed)
Lori [name] - 67 y.o. female MRN [id number]  Date of birth: 1951-05-18  SUBJECTIVE:  Including CC & ROS.  Chief Complaint  Patient presents with  . Headache    Lori [name] is a 67 y.o. female that is presenting with a headache. It has been ongoing for three days. She went to Urgent care on 10/23/17. She was diagnosed with Flu and prescribed Tamiflu. She states she did not have a headache prior taking the medications. Admits to nausea. She took Asprin this morning with no improvement. The pain is generalized. Denies any fevers. She feels that her other symptoms were improving. Has been drinking lots of water. Has not had any sensation changes or vision changes.    Review of Systems  Constitutional: Negative for fever.  Respiratory: Negative for shortness of breath.   Cardiovascular: Negative for chest pain.  Gastrointestinal: Negative for abdominal pain.  Musculoskeletal: Negative for gait problem.  Neurological: Positive for headaches. Negative for weakness.  Psychiatric/Behavioral: Negative for agitation.    HISTORY: Past Medical, Surgical, Social, and Family History Reviewed & Updated per EMR.   Pertinent Historical Findings include:  Past Medical History:  Diagnosis Date  . CTS (carpal tunnel syndrome)    chronic  . Depression 01/16/2013  . Osteopenia   . PONV (postoperative nausea and vomiting)   . Tubular adenoma   . UTI (urinary tract infection)     Past Surgical History:  Procedure Laterality Date  . ABDOMINAL HYSTERECTOMY  1994   fibroids  . APPENDECTOMY    . BUNIONECTOMY Right   . BUNIONECTOMY Left 2018  . CHOLECYSTECTOMY    . COLONOSCOPY  12/09/2008   Dr.Rourk- normal rectum, polyp at the hepatic flexure bx= tubular adenoma. the remainder of the colonic mucosa and terminal ileum mucosa appeared normal.  . COLONOSCOPY  2006   Dr.Sam [place]- colonic adenoma  . COLONOSCOPY N/A 06/25/2014   Procedure: COLONOSCOPY;  Surgeon: Daneil Dolin, MD;  Location: [facility];  Service: Endoscopy;  Laterality: N/A;  1030 DR REQUEST TIME  . KNEE ARTHROSCOPY Right    torn meniscus  . ORIF TOE FRACTURE Right   . rotator Cuff surgery    . TUBAL LIGATION      Allergies  Allergen Reactions  . Codeine     nausea  . Penicillins Nausea And Vomiting  . Tramadol     Family History  Problem Relation Age of Onset  . Bipolar disorder Sister   . Schizophrenia Sister   . Kidney disease Sister   . Hypertension Sister   . Bipolar disorder Sister   . Kidney disease Sister   . Hypertension Mother   . Mental illness Mother   . Dementia Mother   . Cancer Father 67       colon; bladder  . Hypertension Father   . Heart disease Father   . Arthritis Father   . Bipolar disorder Son      Social History   Socioeconomic History  . Marital status: Widowed    Spouse name: Not on file  . Number of children: 2  . Years of education: Not on file  . Highest education level: Not on file  Social Needs  . Financial resource strain: Not on file  . Food insecurity - worry: Not on file  . Food insecurity - inability: Not on file  . Transportation needs - medical: Not on file  . Transportation needs - non-medical: Not on file  Occupational History  . Not  on file  Tobacco Use  . Smoking status: Former Smoker    Packs/day: 1.00    Years: 10.00    Pack years: 10.00    Types: Cigarettes    Start date: 04/13/1971    Last attempt to quit: 11/01/1993    Years since quitting: 24.0  . Smokeless tobacco: Never Used  Substance and Sexual Activity  . Alcohol use: Yes    Alcohol/week: 0.0 oz    Comment: wine  . Drug use: No  . Sexual activity: Yes    Birth control/protection: Surgical  Other Topics Concern  . Not on file  Social History Narrative   Exercising regularly - senior exercises, walking     PHYSICAL EXAM:  VS: BP 138/84 (BP Location: Left Arm, Patient Position: Sitting, Cuff Size: Normal)   Pulse 78   Temp 98.9 F (37.2 C) (Oral)   Ht 5' 4.57" (1.64  m)   Wt 168 lb (76.2 kg)   SpO2 98%   BMI 28.33 kg/m  Physical Exam Gen: NAD, alert, cooperative with exam,  ENT: normal lips, normal nasal mucosa,  Eye: normal EOM, normal conjunctiva and lids CV:  no edema, +2 pedal pulses, regular rate and rhythm, S1-S2 Resp: no accessory muscle use, non-labored, clear to auscultation bilaterally, no crackles or wheezes Skin: no rashes, no areas of induration  Neuro: normal tone, normal sensation to touch Psych:  normal insight, alert and oriented MSK: normal strength, normal gait      ASSESSMENT & PLAN:   Acute nonintractable headache Possible that the headache is related to the Tamiflu that she was prescribed for the flu.  No deficits on exam today.  It is not the worst headache of her life.  Has not had any vision changes. -Counseled and advised to discontinue the Tamiflu and Tessalon Perles. -Counseled on the use ibuprofen and Tylenol -Can follow-up if no improvement

## 2017-10-27 NOTE — Telephone Encounter (Signed)
Pt  Reports   Headache    X  4 days  Ago   Pt  Usually  Does  Not  Have  Headaches      She  Was  Seen   Recently  For  Flu  And  Placed  On  meds    To include  tamiflu  .  Pt is  Awake  And  Alert . No  Availability  With  Her   pcp appointment made  Today  At [place]    Pt  Advised  Not to  Drive  And  Only  Take  Tylenol  For the  Pain   Till   Seen today    Reason for Disposition . [1] MODERATE headache (e.g., interferes with normal activities) AND [2] present > 24 hours AND [3] unexplained  (Exceptions: analgesics not tried, typical migraine, or headache part of viral illness)  Answer Assessment - Initial Assessment Questions 1. LOCATION: "Where does it hurt?"        Top  Of  Head     2. ONSET: "When did the headache start?" (Minutes, hours or days)          4  Days    3. PATTERN: "Does the pain come and go, or has it been constant since it started?"          Constant 4. SEVERITY: "How bad is the pain?" and "What does it keep you from doing?"  (e.g., Scale 1-10; mild, moderate, or severe)   - MILD (1-3): doesn't interfere with normal activities    - MODERATE (4-7): interferes with normal activities or awakens from sleep    - SEVERE (8-10): excruciating pain, unable to do any normal activities        mod 5. RECURRENT SYMPTOM: "Have you ever had headaches before?" If so, ask: "When was the last time?" and "What happened that time?"        NO   6. CAUSE: "What do you think is causing the headache?"       POSSIBLY  MEDICATION    7. MIGRAINE: "Have you been diagnosed with migraine headaches?" If so, ask: "Is this headache similar?"         NO 8. HEAD INJURY: "Has there been any recent injury to the head?"          NO 9. OTHER SYMPTOMS: "Do you have any other symptoms?" (fever, stiff neck, eye pain, sore throat, cold symptoms)          NO 10. PREGNANCY: "Is there any chance you are pregnant?" "When was your last menstrual period?"      n/a  Protocols used: HEADACHE-A-AH

## 2017-10-27 NOTE — Assessment & Plan Note (Signed)
Possible that the headache is related to the Tamiflu that she was prescribed for the flu.  No deficits on exam today.  It is not the worst headache of her life.  Has not had any vision changes. -Counseled and advised to discontinue the Tamiflu and Tessalon Perles. -Counseled on the use ibuprofen and Tylenol -Can follow-up if no improvement

## 2017-11-20 NOTE — Progress Notes (Signed)
Subjective:    Patient ID: Lori [name], female    DOB: July 24, 1951, 67 y.o.   MRN: [id number]  HPI The patient is here for follow up.  Cough:  She was diagnosed with the flu 10/23/17.  She started coughing the day after Christmas.  She still has a cough.  It is worse at night.  It sounds like a wet cough but she is not bringing anything up.  She denies fever, chills, cold symptoms, sob and wheeze.  She denies gerd.    Bruise on leg:   She has bruise on her right posterior leg.  She denies injury.  The bruise has faded but she still has a lump there. She was worried about a blood clot.   Left arm pain;  At night her left arm goes numb.  It she sits up and moves her arm around it goes away. She denies neck pain and right arm pain.  She occasionally feels it during the day.  She denies weakness/pain in the left arm.    Finger numbness/tingling:  She has b/l finger tingling that started in the past three days.  She notices it during the day.    Medications and allergies reviewed with patient and updated if appropriate.  Patient Active Problem List   Diagnosis Date Noted  . Acute nonintractable headache 10/27/2017  . Thrush 05/09/2017  . Fever 05/02/2017  . Pyelonephritis 05/02/2017  . Hyperlipidemia 04/27/2017  . Right hand pain 01/19/2017  . Personal history of colonic polyps 06/06/2014  . Family history of colon cancer 06/06/2014  . Osteopenia 11/04/2013  . Neuropathy of hand 04/28/2013    Current Outpatient Medications on File Prior to Visit  Medication Sig Dispense Refill  . CALCIUM-VITAMIN D PO Take by mouth.    . Cyanocobalamin (VITAMIN B-12 PO) Take by mouth.    . Multiple Vitamins-Minerals (CENTRUM ULTRA WOMENS PO) Take by mouth.     No current facility-administered medications on file prior to visit.     Past Medical History:  Diagnosis Date  . CTS (carpal tunnel syndrome)    chronic  . Depression 01/16/2013  . Osteopenia   . PONV (postoperative nausea and  vomiting)   . Tubular adenoma   . UTI (urinary tract infection)     Past Surgical History:  Procedure Laterality Date  . ABDOMINAL HYSTERECTOMY  1994   fibroids  . APPENDECTOMY    . BUNIONECTOMY Right   . BUNIONECTOMY Left 2018  . CHOLECYSTECTOMY    . COLONOSCOPY  12/09/2008   Dr.Rourk- normal rectum, polyp at the hepatic flexure bx= tubular adenoma. the remainder of the colonic mucosa and terminal ileum mucosa appeared normal.  . COLONOSCOPY  2006   Dr.Sam [place]- colonic adenoma  . COLONOSCOPY N/A 06/25/2014   Procedure: COLONOSCOPY;  Surgeon: Daneil Dolin, MD;  Location: [facility];  Service: Endoscopy;  Laterality: N/A;  1030 DR REQUEST TIME  . KNEE ARTHROSCOPY Right    torn meniscus  . ORIF TOE FRACTURE Right   . rotator Cuff surgery    . TUBAL LIGATION      Social History   Socioeconomic History  . Marital status: Widowed    Spouse name: Not on file  . Number of children: 2  . Years of education: Not on file  . Highest education level: Not on file  Social Needs  . Financial resource strain: Not on file  . Food insecurity - worry: Not on file  . Food insecurity -  inability: Not on file  . Transportation needs - medical: Not on file  . Transportation needs - non-medical: Not on file  Occupational History  . Not on file  Tobacco Use  . Smoking status: Former Smoker    Packs/day: 1.00    Years: 10.00    Pack years: 10.00    Types: Cigarettes    Start date: 04/13/1971    Last attempt to quit: 11/01/1993    Years since quitting: 24.0  . Smokeless tobacco: Never Used  Substance and Sexual Activity  . Alcohol use: Yes    Alcohol/week: 0.0 oz    Comment: wine  . Drug use: No  . Sexual activity: Yes    Birth control/protection: Surgical  Other Topics Concern  . Not on file  Social History Narrative   Exercising regularly - senior exercises, walking    Family History  Problem Relation Age of Onset  . Bipolar disorder Sister   . Schizophrenia Sister   .  Kidney disease Sister   . Hypertension Sister   . Bipolar disorder Sister   . Kidney disease Sister   . Hypertension Mother   . Mental illness Mother   . Dementia Mother   . Cancer Father 39       colon; bladder  . Hypertension Father   . Heart disease Father   . Arthritis Father   . Bipolar disorder Son     Review of Systems  Constitutional: Negative for chills and fever.  HENT: Negative for congestion, postnasal drip and rhinorrhea.   Respiratory: Positive for cough. Negative for shortness of breath and wheezing.   Cardiovascular: Negative for chest pain, palpitations and leg swelling.  Gastrointestinal: Negative for nausea.       No gerd  Neurological: Negative for light-headedness and headaches.       Objective:   Vitals:   11/21/17 0756  BP: 126/88  Pulse: 80  Resp: 16  Temp: 98.8 F (37.1 C)  SpO2: 98%   Wt Readings from Last 3 Encounters:  11/21/17 168 lb (76.2 kg)  10/27/17 168 lb (76.2 kg)  10/23/17 169 lb (76.7 kg)   Body mass index is 28.33 kg/m.   Physical Exam    Constitutional: Appears well-developed and well-nourished. No distress.  HENT:  Head: Normocephalic and atraumatic.  Neck: Neck supple. No tracheal deviation present. No thyromegaly present.  No cervical lymphadenopathy Cardiovascular: Normal rate, regular rhythm and normal heart sounds.   No murmur heard. .  No edema Pulmonary/Chest: Effort normal and breath sounds normal. No respiratory distress. No has no wheezes. No rales.  Neuro: normal sensation and strength b/l UE Skin: Skin is warm and dry. Not diaphoretic.  Psychiatric: Normal mood and affect. Behavior is normal.      Assessment & Plan:    See Problem List for Assessment and Plan of chronic medical problems.

## 2017-11-21 ENCOUNTER — Encounter: Payer: Self-pay | Admitting: Internal Medicine

## 2017-11-21 ENCOUNTER — Ambulatory Visit: Payer: Federal, State, Local not specified - PPO | Admitting: Internal Medicine

## 2017-11-21 VITALS — BP 126/88 | HR 80 | Temp 98.8°F | Resp 16 | Wt 168.0 lb

## 2017-11-21 DIAGNOSIS — R200 Anesthesia of skin: Secondary | ICD-10-CM | POA: Diagnosis not present

## 2017-11-21 DIAGNOSIS — R202 Paresthesia of skin: Secondary | ICD-10-CM | POA: Diagnosis not present

## 2017-11-21 DIAGNOSIS — R059 Cough, unspecified: Secondary | ICD-10-CM

## 2017-11-21 DIAGNOSIS — T148XXA Other injury of unspecified body region, initial encounter: Secondary | ICD-10-CM

## 2017-11-21 DIAGNOSIS — R05 Cough: Secondary | ICD-10-CM | POA: Diagnosis not present

## 2017-11-21 HISTORY — DX: Anesthesia of skin: R20.0

## 2017-11-21 NOTE — Assessment & Plan Note (Addendum)
Dry, intermittent Since she had the flu Slowly improving She feels well/ does not feel sick Exam normal and no other concerning symptoms Reassured  Symptomatic treatment

## 2017-11-21 NOTE — Patient Instructions (Addendum)
Your cough will likely slowly increase.  Treat it symptomatically.  You can apply heat and massage the bruise on your right leg - it will take a few weeks to completely go away.   Monitor your arm pain and numbness/tingling in the fingers.

## 2017-11-21 NOTE — Assessment & Plan Note (Signed)
Right leg - superficial, improving on its own Minimally tender, small palpable lump Reassured - not a superficial blood clot Treat with massage and heat

## 2017-11-21 NOTE — Assessment & Plan Note (Signed)
Mostly at night, rarely during day Improves with movement/change in position Likely pinched nerve Discussed that we can refer for further evaluation or just monitor  No alarm symptoms so will just monitor for now

## 2017-11-21 NOTE — Assessment & Plan Note (Signed)
In fingers tips ? Carpal tunnel vs cervical radiculopathy vs b12 def Just started 3 days ago Monitor for now If persists will evaluate further

## 2018-01-02 ENCOUNTER — Encounter: Payer: Self-pay | Admitting: Family Medicine

## 2018-01-02 ENCOUNTER — Ambulatory Visit: Payer: Federal, State, Local not specified - PPO | Admitting: Family Medicine

## 2018-01-02 VITALS — BP 124/82 | HR 77 | Temp 99.2°F | Ht 64.0 in | Wt 168.0 lb

## 2018-01-02 DIAGNOSIS — T7840XA Allergy, unspecified, initial encounter: Secondary | ICD-10-CM

## 2018-01-02 NOTE — Patient Instructions (Signed)
Please try benadryl for your symptoms  Please go to the emergency department if your symptoms worsen.

## 2018-01-02 NOTE — Progress Notes (Signed)
Lori [name] - 67 y.o. female MRN [id number]  Date of birth: Mar 20, 1951  SUBJECTIVE:  Including CC & ROS.  Chief Complaint  Patient presents with  . Allergic Reaction    Throat feels like it was closiing up; Bumps inside mouth and throat    Lori [name] is a 67 y.o. female that is presenting with allergic type symptoms. 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 No shortness of breath. No history of angioedema. Has taken a benadryl and felt improvement of her symptoms.     Review of Systems  Constitutional: Negative for fever.  HENT: Negative for congestion.   Respiratory: Negative for shortness of breath.   Cardiovascular: Negative for chest pain.  Gastrointestinal: Negative for abdominal pain.  Musculoskeletal: Negative for back pain.  Skin: Negative for color change.  Neurological: Negative for weakness.  Hematological: Negative for adenopathy.  Psychiatric/Behavioral: Negative for agitation.    HISTORY: Past Medical, Surgical, Social, and Family History Reviewed & Updated per EMR.   Pertinent Historical Findings include:  Past Medical History:  Diagnosis Date  . CTS (carpal tunnel syndrome)    chronic  . Depression 01/16/2013  . Osteopenia   . PONV (postoperative nausea and vomiting)   . Tubular adenoma   . UTI (urinary tract infection)     Past Surgical History:  Procedure Laterality Date  . ABDOMINAL HYSTERECTOMY  1994   fibroids  . APPENDECTOMY    . BUNIONECTOMY Right   . BUNIONECTOMY Left 2018  . CHOLECYSTECTOMY    . COLONOSCOPY  12/09/2008   Dr.Rourk- normal rectum, polyp at the hepatic flexure bx= tubular adenoma. the remainder of the colonic mucosa and terminal ileum mucosa appeared normal.  . COLONOSCOPY  2006   Dr.Sam [place]- colonic adenoma  . COLONOSCOPY N/A 06/25/2014   Procedure: COLONOSCOPY;  Surgeon: Daneil Dolin, MD;  Location: [facility];  Service: Endoscopy;  Laterality: N/A;  1030 DR  REQUEST TIME  . KNEE ARTHROSCOPY Right    torn meniscus  . ORIF TOE FRACTURE Right   . rotator Cuff surgery    . TUBAL LIGATION      Allergies  Allergen Reactions  . Codeine     nausea  . Penicillins Nausea And Vomiting  . Tramadol     Family History  Problem Relation Age of Onset  . Bipolar disorder Sister   . Schizophrenia Sister   . Kidney disease Sister   . Hypertension Sister   . Bipolar disorder Sister   . Kidney disease Sister   . Hypertension Mother   . Mental illness Mother   . Dementia Mother   . Cancer Father 68       colon; bladder  . Hypertension Father   . Heart disease Father   . Arthritis Father   . Bipolar disorder Son      Social History   Socioeconomic History  . Marital status: Widowed    Spouse name: Not on file  . Number of children: 2  . Years of education: Not on file  . Highest education level: Not on file  Social Needs  . Financial resource strain: Not on file  . Food insecurity - worry: Not on file  . Food insecurity - inability: Not on file  . Transportation needs - medical: Not on file  . Transportation needs - non-medical: Not on file  Occupational History  . Not on file  Tobacco Use  .  Smoking status: Former Smoker    Packs/day: 1.00    Years: 10.00    Pack years: 10.00    Types: Cigarettes    Start date: 04/13/1971    Last attempt to quit: 11/01/1993    Years since quitting: 24.1  . Smokeless tobacco: Never Used  Substance and Sexual Activity  . Alcohol use: Yes    Alcohol/week: 0.0 oz    Comment: wine  . Drug use: No  . Sexual activity: Yes    Birth control/protection: Surgical  Other Topics Concern  . Not on file  Social History Narrative   Exercising regularly - senior exercises, walking     PHYSICAL EXAM:  VS: BP 124/82 (BP Location: Left Arm, Patient Position: Sitting, Cuff Size: Normal)   Pulse 77   Temp 99.2 F (37.3 C) (Oral)   Ht 5\' 4"  (1.626 m)   Wt 168 lb 0.6 oz (76.2 kg)   SpO2 100%   BMI  28.84 kg/m  Physical Exam Gen: NAD, alert, cooperative with exam, well-appearing ENT: normal lips, normal nasal mucosa, tympanic membranes clear and intact bilaterally, normal oropharynx, no cervical lymphadenopathy Eye: normal EOM, normal conjunctiva and lids CV:  no edema, +2 pedal pulses, regular rate and rhythm, S1-S2   Resp: no accessory muscle use, non-labored, clear to auscultation bilaterally, no crackles or wheezes  Skin: no rashes, no areas of induration  Neuro: normal tone, normal sensation to touch Psych:  normal insight, alert and oriented MSK: Normal gait, normal strength       ASSESSMENT & PLAN:   Allergic reaction Symptoms appear to be allergic in nature. No angioedema on exam today  - counseled on supportive care  - given indications to seek immediate care.

## 2018-01-03 DIAGNOSIS — T7840XA Allergy, unspecified, initial encounter: Secondary | ICD-10-CM | POA: Insufficient documentation

## 2018-01-03 NOTE — Assessment & Plan Note (Signed)
Symptoms appear to be allergic in nature. No angioedema on exam today  - counseled on supportive care  - given indications to seek immediate care.

## 2018-02-06 ENCOUNTER — Other Ambulatory Visit: Payer: Self-pay

## 2018-02-06 ENCOUNTER — Emergency Department (HOSPITAL_COMMUNITY)
Admission: EM | Admit: 2018-02-06 | Discharge: 2018-02-06 | Disposition: A | Discharge status: Home / Self Care | Payer: Federal, State, Local not specified - PPO | Attending: Emergency Medicine | Admitting: Emergency Medicine

## 2018-02-06 ENCOUNTER — Encounter (HOSPITAL_COMMUNITY): Payer: Self-pay

## 2018-02-06 DIAGNOSIS — Z79899 Other long term (current) drug therapy: Secondary | ICD-10-CM | POA: Insufficient documentation

## 2018-02-06 DIAGNOSIS — R51 Headache: Secondary | ICD-10-CM | POA: Diagnosis present

## 2018-02-06 DIAGNOSIS — Z87891 Personal history of nicotine dependence: Secondary | ICD-10-CM | POA: Diagnosis not present

## 2018-02-06 DIAGNOSIS — M7918 Myalgia, other site: Secondary | ICD-10-CM

## 2018-02-06 DIAGNOSIS — M791 Myalgia, unspecified site: Secondary | ICD-10-CM | POA: Insufficient documentation

## 2018-02-06 NOTE — Discharge Instructions (Addendum)
Tylenol or Ibuprofen for discomfort.Return if any problems.

## 2018-02-06 NOTE — ED Notes (Signed)
Vital signs documented at 1034 wrong pt.

## 2018-02-06 NOTE — ED Notes (Signed)
Bed: WTR9 Expected date:  Expected time:  Means of arrival:  Comments: 
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
## 2018-03-21 NOTE — Progress Notes (Signed)
Subjective:    Patient ID: Lori [name], female    DOB: 13-Oct-1951, 67 y.o.   MRN: [id number]  HPI The patient is here for an acute visit.  She has a spot on her abdomen.  She is unsure if she was bit or what happened.  The area was minimally itchy.  She did scratch it a little.  She has put neosporin and hydrocortisone cream on it.  She goes on vacation soon and wanted to make sure it was ok.  There is no discharge or drainage.    She denies fever.    Medications and allergies reviewed with patient and updated if appropriate.  Patient Active Problem List   Diagnosis Date Noted  . Allergic reaction 01/03/2018  . Bruise 11/21/2017  . Cough 11/21/2017  . Left arm numbness 11/21/2017  . Numbness and tingling 11/21/2017  . Fever 05/02/2017  . Pyelonephritis 05/02/2017  . Hyperlipidemia 04/27/2017  . Right hand pain 01/19/2017  . Personal history of colonic polyps 06/06/2014  . Family history of colon cancer 06/06/2014  . Osteopenia 11/04/2013  . Neuropathy of hand 04/28/2013    Current Outpatient Medications on File Prior to Visit  Medication Sig Dispense Refill  . CALCIUM-VITAMIN D PO Take by mouth.    . Cyanocobalamin (VITAMIN B-12 PO) Take by mouth.    . Multiple Vitamins-Minerals (CENTRUM ULTRA WOMENS PO) Take by mouth.     No current facility-administered medications on file prior to visit.     Past Medical History:  Diagnosis Date  . CTS (carpal tunnel syndrome)    chronic  . Depression 01/16/2013  . Osteopenia   . PONV (postoperative nausea and vomiting)   . Tubular adenoma   . UTI (urinary tract infection)     Past Surgical History:  Procedure Laterality Date  . ABDOMINAL HYSTERECTOMY  1994   fibroids  . APPENDECTOMY    . BUNIONECTOMY Right   . BUNIONECTOMY Left 2018  . CHOLECYSTECTOMY    . COLONOSCOPY  12/09/2008   Dr.Rourk- normal rectum, polyp at the hepatic flexure bx= tubular adenoma. the remainder of the colonic mucosa and terminal ileum mucosa  appeared normal.  . COLONOSCOPY  2006   Dr.Sam [place]- colonic adenoma  . COLONOSCOPY N/A 06/25/2014   Procedure: COLONOSCOPY;  Surgeon: Daneil Dolin, MD;  Location: [facility];  Service: Endoscopy;  Laterality: N/A;  1030 DR REQUEST TIME  . KNEE ARTHROSCOPY Right    torn meniscus  . ORIF TOE FRACTURE Right   . rotator Cuff surgery    . TUBAL LIGATION      Social History   Socioeconomic History  . Marital status: Widowed    Spouse name: Not on file  . Number of children: 2  . Years of education: Not on file  . Highest education level: Not on file  Occupational History  . Not on file  Social Needs  . Financial resource strain: Not on file  . Food insecurity:    Worry: Not on file    Inability: Not on file  . Transportation needs:    Medical: Not on file    Non-medical: Not on file  Tobacco Use  . Smoking status: Former Smoker    Packs/day: 1.00    Years: 10.00    Pack years: 10.00    Types: Cigarettes    Start date: 04/13/1971    Last attempt to quit: 11/01/1993    Years since quitting: 24.4  . Smokeless tobacco: Never  Used  Substance and Sexual Activity  . Alcohol use: Yes    Alcohol/week: 0.0 oz    Comment: wine  . Drug use: No  . Sexual activity: Yes    Birth control/protection: Surgical  Lifestyle  . Physical activity:    Days per week: Not on file    Minutes per session: Not on file  . Stress: Not on file  Relationships  . Social connections:    Talks on phone: Not on file    Gets together: Not on file    Attends religious service: Not on file    Active member of club or organization: Not on file    Attends meetings of clubs or organizations: Not on file    Relationship status: Not on file  Other Topics Concern  . Not on file  Social History Narrative   Exercising regularly - senior exercises, walking    Family History  Problem Relation Age of Onset  . Bipolar disorder Sister   . Schizophrenia Sister   . Kidney disease Sister   .  Hypertension Sister   . Bipolar disorder Sister   . Kidney disease Sister   . Hypertension Mother   . Mental illness Mother   . Dementia Mother   . Cancer Father 50       colon; bladder  . Hypertension Father   . Heart disease Father   . Arthritis Father   . Bipolar disorder Son     Review of Systems  Constitutional: Negative for chills and fever.  Musculoskeletal: Negative for arthralgias and myalgias.  Skin: Positive for wound. Negative for rash.  Neurological: Negative for light-headedness and headaches.       Objective:   Vitals:   03/22/18 1057  BP: 126/76  Pulse: 61  Resp: 16  Temp: 98.5 F (36.9 C)  SpO2: 98%   BP Readings from Last 3 Encounters:  03/22/18 126/76  02/06/18 129/74  01/02/18 124/82   Wt Readings from Last 3 Encounters:  03/22/18 164 lb (74.4 kg)  02/06/18 165 lb (74.8 kg)  01/02/18 168 lb 0.6 oz (76.2 kg)   Body mass index is 27.29 kg/m.   Physical Exam  Constitutional: She appears well-developed and well-nourished. No distress.  HENT:  Head: Normocephalic and atraumatic.  Musculoskeletal: She exhibits no edema.  Skin: Skin is warm and dry. No rash noted. She is not diaphoretic.  Area of minimal redness with area of excoriation of skin, no induration, no discharge, scabbed scratch marks next to wound           Assessment & Plan:    See Problem List for Assessment and Plan of chronic medical problems.

## 2018-03-22 ENCOUNTER — Encounter: Payer: Self-pay | Admitting: Internal Medicine

## 2018-03-22 ENCOUNTER — Ambulatory Visit: Payer: Federal, State, Local not specified - PPO | Admitting: Internal Medicine

## 2018-03-22 VITALS — BP 126/76 | HR 61 | Temp 98.5°F | Resp 16 | Wt 164.0 lb

## 2018-03-22 DIAGNOSIS — S30861A Insect bite (nonvenomous) of abdominal wall, initial encounter: Secondary | ICD-10-CM | POA: Diagnosis not present

## 2018-03-22 DIAGNOSIS — W57XXXA Bitten or stung by nonvenomous insect and other nonvenomous arthropods, initial encounter: Secondary | ICD-10-CM

## 2018-03-22 NOTE — Patient Instructions (Signed)
Continue symptomatic treatment.   Call if no improvement

## 2018-03-22 NOTE — Assessment & Plan Note (Signed)
Area looks like insect bite No evidence of infection Symptomatic treatment only

## 2018-04-09 ENCOUNTER — Encounter: Payer: Self-pay | Admitting: Family

## 2018-04-09 ENCOUNTER — Ambulatory Visit: Payer: Federal, State, Local not specified - PPO | Admitting: Family

## 2018-04-09 VITALS — BP 122/80 | HR 104 | Temp 98.6°F | Ht 65.0 in | Wt 163.0 lb

## 2018-04-09 DIAGNOSIS — L309 Dermatitis, unspecified: Secondary | ICD-10-CM

## 2018-04-09 DIAGNOSIS — W57XXXA Bitten or stung by nonvenomous insect and other nonvenomous arthropods, initial encounter: Secondary | ICD-10-CM | POA: Diagnosis not present

## 2018-04-09 MED ORDER — DOXYCYCLINE HYCLATE 100 MG PO TABS: 100.0000 mg | ORAL_TABLET | Freq: Two times a day (BID) | ORAL | 0 refills | Status: AC

## 2018-04-09 NOTE — Progress Notes (Signed)
Lori [name] is a 67 y.o. female with the following history as recorded in [employer]:  Patient Active Problem List   Diagnosis Date Noted  . Insect bite of abdominal wall 03/22/2018  . Allergic reaction 01/03/2018  . Bruise 11/21/2017  . Cough 11/21/2017  . Left arm numbness 11/21/2017  . Numbness and tingling 11/21/2017  . Fever 05/02/2017  . Pyelonephritis 05/02/2017  . Hyperlipidemia 04/27/2017  . Right hand pain 01/19/2017  . Personal history of colonic polyps 06/06/2014  . Family history of colon cancer 06/06/2014  . Osteopenia 11/04/2013  . Neuropathy of hand 04/28/2013    Current Outpatient Medications  Medication Sig Dispense Refill  . CALCIUM-VITAMIN D PO Take by mouth.    . Cyanocobalamin (VITAMIN B-12 PO) Take by mouth.    . Multiple Vitamins-Minerals (CENTRUM ULTRA WOMENS PO) Take by mouth.    Marland Kitchen PREMARIN vaginal cream INSERT 1/2 GRAM VAGINALLY 2 TIMES A WEEK  2  . Testosterone POWD     . doxycycline (VIBRA-TABS) 100 MG tablet Take 1 tablet (100 mg total) by mouth 2 (two) times daily for 1 day. 2 tablet 0   No current facility-administered medications for this visit.     Allergies: Codeine; Penicillins; and Tramadol  Past Medical History:  Diagnosis Date  . CTS (carpal tunnel syndrome)    chronic  . Depression 01/16/2013  . Osteopenia   . PONV (postoperative nausea and vomiting)   . Tubular adenoma   . UTI (urinary tract infection)     Past Surgical History:  Procedure Laterality Date  . ABDOMINAL HYSTERECTOMY  1994   fibroids  . APPENDECTOMY    . BUNIONECTOMY Right   . BUNIONECTOMY Left 2018  . CHOLECYSTECTOMY    . COLONOSCOPY  12/09/2008   Dr.Rourk- normal rectum, polyp at the hepatic flexure bx= tubular adenoma. the remainder of the colonic mucosa and terminal ileum mucosa appeared normal.  . COLONOSCOPY  2006   Dr.Sam [place]- colonic adenoma  . COLONOSCOPY N/A 06/25/2014   Procedure: COLONOSCOPY;  Surgeon: Daneil Dolin, MD;  Location: [facility];  Service: Endoscopy;  Laterality: N/A;  1030 DR REQUEST TIME  . KNEE ARTHROSCOPY Right    torn meniscus  . ORIF TOE FRACTURE Right   . rotator Cuff surgery    . TUBAL LIGATION      Family History  Problem Relation Age of Onset  . Bipolar disorder Sister   . Schizophrenia Sister   . Kidney disease Sister   . Hypertension Sister   . Bipolar disorder Sister   . Kidney disease Sister   . Hypertension Mother   . Mental illness Mother   . Dementia Mother   . Cancer Father 75       colon; bladder  . Hypertension Father   . Heart disease Father   . Arthritis Father   . Bipolar disorder Son     Social History   Tobacco Use  . Smoking status: Former Smoker    Packs/day: 1.00    Years: 10.00    Pack years: 10.00    Types: Cigarettes    Start date: 04/13/1971    Last attempt to quit: 11/01/1993    Years since quitting: 24.4  . Smokeless tobacco: Never Used  Substance Use Topics  . Alcohol use: Yes    Alcohol/week: 0.0 oz    Comment: wine    Subjective:  Patient presents with concerns for tick bite; area of concern on her upper left shoulder; she  does think she was able to remove the entire tick completely; is concerned about developing illness/ infection as she will be leaving for a cruise next week; at this time, denies any fever, rash or joint aches; feels that tick was attached for no more than 24 hours;     Objective:  Vitals:   04/09/18 1049  BP: 122/80  Pulse: (!) 104  Temp: 98.6 F (37 C)  TempSrc: Oral  SpO2: 97%  Weight: 163 lb 0.6 oz (74 kg)  Height: 5\' 5"  (1.651 m)    General: Well developed, well nourished, in no acute distress  Skin : Warm and dry. Localized area on upper left shoulder c/w insect bite Head: Normocephalic and atraumatic  Lungs: Respirations unlabored; clear to auscultation bilaterally without wheeze, rales, rhonchi  Neurologic: Alert and oriented; speech intact; face symmetrical; moves all extremities well; CNII-XII intact without  focal deficit   Assessment:  1. Dermatitis   2. Tick bite, initial encounter     Plan:  Rx for Doxycycline 100 mg bid x 1 day; reassurance; follow-up worse, no better.   No follow-ups on file.  No orders of the defined types were placed in this encounter.   Requested Prescriptions   Signed Prescriptions Disp Refills  . doxycycline (VIBRA-TABS) 100 MG tablet 2 tablet 0    Sig: Take 1 tablet (100 mg total) by mouth 2 (two) times daily for 1 day.

## 2018-04-17 ENCOUNTER — Ambulatory Visit: Payer: Federal, State, Local not specified - PPO | Admitting: Internal Medicine

## 2018-04-17 ENCOUNTER — Encounter: Payer: Self-pay | Admitting: Internal Medicine

## 2018-04-17 VITALS — BP 130/84 | HR 64 | Temp 98.6°F | Resp 16 | Wt 164.0 lb

## 2018-04-17 DIAGNOSIS — R42 Dizziness and giddiness: Secondary | ICD-10-CM

## 2018-04-17 HISTORY — DX: Dizziness and giddiness: R42

## 2018-04-17 MED ORDER — MECLIZINE HCL 12.5 MG PO TABS: 12.5000 mg | ORAL_TABLET | Freq: Three times a day (TID) | ORAL | 0 refills | Status: DC | PRN

## 2018-04-17 MED ORDER — ONDANSETRON 4 MG PO TBDP: 4.0000 mg | ORAL_TABLET | Freq: Three times a day (TID) | ORAL | 0 refills | Status: DC | PRN

## 2018-04-17 NOTE — Progress Notes (Signed)
Subjective:    Patient ID: Lori [name], female    DOB: 06-28-1951, 67 y.o.   MRN: [id number]  HPI The patient is here for an acute visit.  Vertigo:  Yesterday she was a litle whoozy headed and she kept mistepping but did not think much about it.  She kept mis-stepping all day.  She felt tired.  This morning she got up to go to the bathroom and the felt whoozy and the room was spinning.  She is ok if she does not move.  Once she moves she feels whoozy and has been spinning sensation.  She has had vertigo in her 20s-30s, but it has been years since she has had it.  Several days ago she did have mild cold symptoms-she is unsure if that is related to the vertigo.  She had a mild sore throat, which has resolved and very mild dry cough.  She still has a little bit of a dry cough, but has not needed to take anything with it.  She denies fevers, chills, other cold symptoms, headaches, change in vision, nausea, numbness/tingling and weakness.    She is going on a cruise this Friday.    Medications and allergies reviewed with patient and updated if appropriate.  Patient Active Problem List   Diagnosis Date Noted  . Insect bite of abdominal wall 03/22/2018  . Allergic reaction 01/03/2018  . Bruise 11/21/2017  . Cough 11/21/2017  . Left arm numbness 11/21/2017  . Numbness and tingling 11/21/2017  . Fever 05/02/2017  . Pyelonephritis 05/02/2017  . Hyperlipidemia 04/27/2017  . Right hand pain 01/19/2017  . Personal history of colonic polyps 06/06/2014  . Family history of colon cancer 06/06/2014  . Osteopenia 11/04/2013  . Neuropathy of hand 04/28/2013    Current Outpatient Medications on File Prior to Visit  Medication Sig Dispense Refill  . CALCIUM-VITAMIN D PO Take by mouth.    . Cyanocobalamin (VITAMIN B-12 PO) Take by mouth.    . Multiple Vitamins-Minerals (CENTRUM ULTRA WOMENS PO) Take by mouth.    Marland Kitchen PREMARIN vaginal cream INSERT 1/2 GRAM VAGINALLY 2 TIMES A WEEK  2  .  Testosterone POWD      No current facility-administered medications on file prior to visit.     Past Medical History:  Diagnosis Date  . CTS (carpal tunnel syndrome)    chronic  . Depression 01/16/2013  . Osteopenia   . PONV (postoperative nausea and vomiting)   . Tubular adenoma   . UTI (urinary tract infection)     Past Surgical History:  Procedure Laterality Date  . ABDOMINAL HYSTERECTOMY  1994   fibroids  . APPENDECTOMY    . BUNIONECTOMY Right   . BUNIONECTOMY Left 2018  . CHOLECYSTECTOMY    . COLONOSCOPY  12/09/2008   Dr.Rourk- normal rectum, polyp at the hepatic flexure bx= tubular adenoma. the remainder of the colonic mucosa and terminal ileum mucosa appeared normal.  . COLONOSCOPY  2006   Dr.Sam [place]- colonic adenoma  . COLONOSCOPY N/A 06/25/2014   Procedure: COLONOSCOPY;  Surgeon: Daneil Dolin, MD;  Location: [facility];  Service: Endoscopy;  Laterality: N/A;  1030 DR REQUEST TIME  . KNEE ARTHROSCOPY Right    torn meniscus  . ORIF TOE FRACTURE Right   . rotator Cuff surgery    . TUBAL LIGATION      Social History   Socioeconomic History  . Marital status: Widowed    Spouse name: Not on file  .  Number of children: 2  . Years of education: Not on file  . Highest education level: Not on file  Occupational History  . Not on file  Social Needs  . Financial resource strain: Not on file  . Food insecurity:    Worry: Not on file    Inability: Not on file  . Transportation needs:    Medical: Not on file    Non-medical: Not on file  Tobacco Use  . Smoking status: Former Smoker    Packs/day: 1.00    Years: 10.00    Pack years: 10.00    Types: Cigarettes    Start date: 04/13/1971    Last attempt to quit: 11/01/1993    Years since quitting: 24.4  . Smokeless tobacco: Never Used  Substance and Sexual Activity  . Alcohol use: Yes    Alcohol/week: 0.0 oz    Comment: wine  . Drug use: No  . Sexual activity: Yes    Birth control/protection: Surgical    Lifestyle  . Physical activity:    Days per week: Not on file    Minutes per session: Not on file  . Stress: Not on file  Relationships  . Social connections:    Talks on phone: Not on file    Gets together: Not on file    Attends religious service: Not on file    Active member of club or organization: Not on file    Attends meetings of clubs or organizations: Not on file    Relationship status: Not on file  Other Topics Concern  . Not on file  Social History Narrative   Exercising regularly - senior exercises, walking    Family History  Problem Relation Age of Onset  . Bipolar disorder Sister   . Schizophrenia Sister   . Kidney disease Sister   . Hypertension Sister   . Bipolar disorder Sister   . Kidney disease Sister   . Hypertension Mother   . Mental illness Mother   . Dementia Mother   . Cancer Father 25       colon; bladder  . Hypertension Father   . Heart disease Father   . Arthritis Father   . Bipolar disorder Son     Review of Systems  Constitutional: Negative for chills and fever.  HENT: Positive for sore throat (last week only). Negative for congestion, ear pain and sinus pain.   Eyes: Negative for visual disturbance.  Respiratory: Positive for cough (mild dry).   Gastrointestinal: Negative for nausea.  Neurological: Positive for dizziness. Negative for weakness, light-headedness, numbness and headaches.       Objective:   Vitals:   04/17/18 1036  BP: 130/84  Pulse: 64  Resp: 16  Temp: 98.6 F (37 C)  SpO2: 99%   BP Readings from Last 3 Encounters:  04/17/18 130/84  04/09/18 122/80  03/22/18 126/76   Wt Readings from Last 3 Encounters:  04/17/18 164 lb (74.4 kg)  04/09/18 163 lb 0.6 oz (74 kg)  03/22/18 164 lb (74.4 kg)   Body mass index is 27.29 kg/m.   Physical Exam  Constitutional: She is oriented to person, place, and time. She appears well-developed and well-nourished. No distress.  HENT:  Head: Normocephalic and atraumatic.   Right Ear: External ear normal.  Left Ear: External ear normal.  Mouth/Throat: Oropharynx is clear and moist.  Bilateral ear canal and tympanic membranes normal  Eyes: Conjunctivae and EOM are normal.  Neck: Neck supple. No tracheal deviation present.  No thyromegaly present.  Cardiovascular: Normal rate and regular rhythm.  No murmur heard. Pulmonary/Chest: Effort normal and breath sounds normal.  Musculoskeletal: She exhibits no edema.  Lymphadenopathy:    She has no cervical adenopathy.  Neurological: She is alert and oriented to person, place, and time. No cranial nerve deficit or sensory deficit.  Normal strength all extremities  Skin: Skin is dry. She is not diaphoretic.           Assessment & Plan:    See Problem List for Assessment and Plan of chronic medical problems.

## 2018-04-17 NOTE — Assessment & Plan Note (Signed)
Likely BPPV or possibly labyrinthitis Information regarding the Epley maneuver given Meclizine 12.5-25 mg 3 times daily as needed Zofran if needed-currently does not have any nausea, but has had nausea in the past with vertigo Call if no improvement-can try diazepam if needed

## 2018-04-17 NOTE — Patient Instructions (Addendum)
Take meclizine 1-2 tabs three times a day as needed.   Use the anti-nausea medication if needed.   How to Perform the Epley Maneuver The Epley maneuver is an exercise that relieves symptoms of vertigo. Vertigo is the feeling that you or your surroundings are moving when they are not. When you feel vertigo, you may feel like the room is spinning and have trouble walking. Dizziness is a little different than vertigo. When you are dizzy, you may feel unsteady or light-headed. You can do this maneuver at home whenever you have symptoms of vertigo. You can do it up to 3 times a day until your symptoms go away. Even though the Epley maneuver may relieve your vertigo for a few weeks, it is possible that your symptoms will return. This maneuver relieves vertigo, but it does not relieve dizziness. What are the risks? If it is done correctly, the Epley maneuver is considered safe. Sometimes it can lead to dizziness or nausea that goes away after a short time. If you develop other symptoms, such as changes in vision, weakness, or numbness, stop doing the maneuver and call your health care provider. How to perform the Epley maneuver 1. Sit on the edge of a bed or table with your back straight and your legs extended or hanging over the edge of the bed or table. 2. Turn your head halfway toward the affected ear or side. 3. Lie backward quickly with your head turned until you are lying flat on your back. You may want to position a pillow under your shoulders. 4. Hold this position for 30 seconds. You may experience an attack of vertigo. This is normal. 5. Turn your head to the opposite direction until your unaffected ear is facing the floor. 6. Hold this position for 30 seconds. You may experience an attack of vertigo. This is normal. Hold this position until the vertigo stops. 7. Turn your whole body to the same side as your head. Hold for another 30 seconds. 8. Sit back up. You can repeat this exercise up to  3 times a day. Follow these instructions at home:  After doing the Epley maneuver, you can return to your normal activities.  Ask your health care provider if there is anything you should do at home to prevent vertigo. He or she may recommend that you: ? Keep your head raised (elevated) with two or more pillows while you sleep. ? Do not sleep on the side of your affected ear. ? Get up slowly from bed. ? Avoid sudden movements during the day. ? Avoid extreme head movement, like looking up or bending over. Contact a health care provider if:  Your vertigo gets worse.  You have other symptoms, including: ? Nausea. ? Vomiting. ? Headache. Get help right away if:  You have vision changes.  You have a severe or worsening headache or neck pain.  You cannot stop vomiting.  You have new numbness or weakness in any part of your body. Summary  Vertigo is the feeling that you or your surroundings are moving when they are not.  The Epley maneuver is an exercise that relieves symptoms of vertigo.  If the Epley maneuver is done correctly, it is considered safe. You can do it up to 3 times a day. This information is not intended to replace advice given to you by your health care provider. Make sure you discuss any questions you have with your health care provider. Document Released: 10/15/2013 Document Revised: 08/30/2016 Document Reviewed:  08/30/2016 Elsevier Interactive Patient Education  2017 [employer]    Vertigo Vertigo is the feeling that you or your surroundings are moving when they are not. Vertigo can be dangerous if it occurs while you are doing something that could endanger you or others, such as driving. What are the causes? This condition is caused by a disturbance in the signals that are sent by your body's sensory systems to your brain. Different causes of a disturbance can lead to vertigo, including:  Infections, especially in the inner ear.  A bad reaction to a  drug, or misuse of alcohol and medicines.  Withdrawal from drugs or alcohol.  Quickly changing positions, as when lying down or rolling over in bed.  Migraine headaches.  Decreased blood flow to the brain.  Decreased blood pressure.  Increased pressure in the brain from a head or neck injury, stroke, infection, tumor, or bleeding.  Central nervous system disorders.  What are the signs or symptoms? Symptoms of this condition usually occur when you move your head or your eyes in different directions. Symptoms may start suddenly, and they usually last for less than a minute. Symptoms may include:  Loss of balance and falling.  Feeling like you are spinning or moving.  Feeling like your surroundings are spinning or moving.  Nausea and vomiting.  Blurred vision or double vision.  Difficulty hearing.  Slurred speech.  Dizziness.  Involuntary eye movement (nystagmus).  Symptoms can be mild and cause only slight annoyance, or they can be severe and interfere with daily life. Episodes of vertigo may return (recur) over time, and they are often triggered by certain movements. Symptoms may improve over time. How is this diagnosed? This condition may be diagnosed based on medical history and the quality of your nystagmus. Your health care provider may test your eye movements by asking you to quickly change positions to trigger the nystagmus. This may be called the Dix-Hallpike test, head thrust test, or roll test. You may be referred to a health care provider who specializes in ear, nose, and throat (ENT) problems (otolaryngologist) or a provider who specializes in disorders of the central nervous system (neurologist). You may have additional testing, including:  A physical exam.  Blood tests.  MRI.  A CT scan.  An electrocardiogram (ECG). This records electrical activity in your heart.  An electroencephalogram (EEG). This records electrical activity in your brain.  Hearing  tests.  How is this treated? Treatment for this condition depends on the cause and the severity of the symptoms. Treatment options include:  Medicines to treat nausea or vertigo. These are usually used for severe cases. Some medicines that are used to treat other conditions may also reduce or eliminate vertigo symptoms. These include: ? Medicines that control allergies (antihistamines). ? Medicines that control seizures (anticonvulsants). ? Medicines that relieve depression (antidepressants). ? Medicines that relieve anxiety (sedatives).  Head movements to adjust your inner ear back to normal. If your vertigo is caused by an ear problem, your health care provider may recommend certain movements to correct the problem.  Surgery. This is rare.  Follow these instructions at home: Safety  Move slowly.Avoid sudden body or head movements.  Avoid driving.  Avoid operating heavy machinery.  Avoid doing any tasks that would cause danger to you or others if you would have a vertigo episode during the task.  If you have trouble walking or keeping your balance, try using a cane for stability. If you feel dizzy or unstable, sit  down right away.  Return to your normal activities as told by your health care provider. Ask your health care provider what activities are safe for you. General instructions  Take over-the-counter and prescription medicines only as told by your health care provider.  Avoid certain positions or movements as told by your health care provider.  Drink enough fluid to keep your urine clear or pale yellow.  Keep all follow-up visits as told by your health care provider. This is important. Contact a health care provider if:  Your medicines do not relieve your vertigo or they make it worse.  You have a fever.  Your condition gets worse or you develop new symptoms.  Your family or friends notice any behavioral changes.  Your nausea or vomiting gets worse.  You have  numbness or a "pins and needles" sensation in part of your body. Get help right away if:  You have difficulty moving or speaking.  You are always dizzy.  You faint.  You develop severe headaches.  You have weakness in your hands, arms, or legs.  You have changes in your hearing or vision.  You develop a stiff neck.  You develop sensitivity to light. This information is not intended to replace advice given to you by your health care provider. Make sure you discuss any questions you have with your health care provider. Document Released: 07/20/2005 Document Revised: 03/23/2016 Document Reviewed: 02/02/2015 Elsevier Interactive Patient Education  [employer].

## 2018-05-06 NOTE — Progress Notes (Signed)
Subjective:    Patient ID: Lori [name], female    DOB: 1951/04/25, 67 y.o.   MRN: [id number]  HPI She is here for a physical exam.     Medications and allergies reviewed with patient and updated if appropriate.  Patient Active Problem List   Diagnosis Date Noted  . Vertigo 04/17/2018  . Insect bite of abdominal wall 03/22/2018  . Allergic reaction 01/03/2018  . Cough 11/21/2017  . Left arm numbness 11/21/2017  . Numbness and tingling 11/21/2017  . Pyelonephritis 05/02/2017  . Hyperlipidemia 04/27/2017  . Right hand pain 01/19/2017  . Personal history of colonic polyps 06/06/2014  . Family history of colon cancer 06/06/2014  . Osteopenia 11/04/2013  . Neuropathy of hand 04/28/2013    Current Outpatient Medications on File Prior to Visit  Medication Sig Dispense Refill  . CALCIUM-VITAMIN D PO Take by mouth.    . Cyanocobalamin (VITAMIN B-12 PO) Take by mouth.    . Multiple Vitamins-Minerals (CENTRUM ULTRA WOMENS PO) Take by mouth.    Marland Kitchen PREMARIN vaginal cream INSERT 1/2 GRAM VAGINALLY 2 TIMES A WEEK  2   No current facility-administered medications on file prior to visit.     Past Medical History:  Diagnosis Date  . CTS (carpal tunnel syndrome)    chronic  . Depression 01/16/2013  . Osteopenia   . PONV (postoperative nausea and vomiting)   . Tubular adenoma   . UTI (urinary tract infection)     Past Surgical History:  Procedure Laterality Date  . ABDOMINAL HYSTERECTOMY  1994   fibroids  . APPENDECTOMY    . BUNIONECTOMY Right   . BUNIONECTOMY Left 2018  . CHOLECYSTECTOMY    . COLONOSCOPY  12/09/2008   Dr.Rourk- normal rectum, polyp at the hepatic flexure bx= tubular adenoma. the remainder of the colonic mucosa and terminal ileum mucosa appeared normal.  . COLONOSCOPY  2006   Dr.Sam Joplin- colonic adenoma  . COLONOSCOPY N/A 06/25/2014   Procedure: COLONOSCOPY;  Surgeon: Daneil Dolin, MD;  Location: [facility];  Service: Endoscopy;  Laterality: N/A;   1030 DR REQUEST TIME  . KNEE ARTHROSCOPY Right    torn meniscus  . ORIF TOE FRACTURE Right   . rotator Cuff surgery    . TUBAL LIGATION      Social History   Socioeconomic History  . Marital status: Widowed    Spouse name: Not on file  . Number of children: 2  . Years of education: Not on file  . Highest education level: Not on file  Occupational History  . Not on file  Social Needs  . Financial resource strain: Not on file  . Food insecurity:    Worry: Not on file    Inability: Not on file  . Transportation needs:    Medical: Not on file    Non-medical: Not on file  Tobacco Use  . Smoking status: Former Smoker    Packs/day: 1.00    Years: 10.00    Pack years: 10.00    Types: Cigarettes    Start date: 04/13/1971    Last attempt to quit: 11/01/1993    Years since quitting: 24.5  . Smokeless tobacco: Never Used  Substance and Sexual Activity  . Alcohol use: Yes    Alcohol/week: 0.0 oz    Comment: wine  . Drug use: No  . Sexual activity: Yes    Birth control/protection: Surgical  Lifestyle  . Physical activity:    Days per week:  Not on file    Minutes per session: Not on file  . Stress: Not on file  Relationships  . Social connections:    Talks on phone: Not on file    Gets together: Not on file    Attends religious service: Not on file    Active member of club or organization: Not on file    Attends meetings of clubs or organizations: Not on file    Relationship status: Not on file  Other Topics Concern  . Not on file  Social History Narrative   Exercising regularly - senior exercises, walking    Family History  Problem Relation Age of Onset  . Bipolar disorder Sister   . Schizophrenia Sister   . Kidney disease Sister   . Hypertension Sister   . Bipolar disorder Sister   . Kidney disease Sister   . Hypertension Mother   . Mental illness Mother   . Dementia Mother   . Cancer Father 23       colon; bladder  . Hypertension Father   . Heart disease  Father   . Arthritis Father   . Bipolar disorder Son     Review of Systems  Constitutional: Negative for appetite change, chills, fatigue and fever.  Eyes: Negative for visual disturbance.  Respiratory: Negative for cough, shortness of breath and wheezing.   Cardiovascular: Negative for chest pain, palpitations and leg swelling.  Gastrointestinal: Negative for abdominal pain, blood in stool, constipation, diarrhea and nausea.       No gerd  Genitourinary: Negative for dysuria and hematuria.  Musculoskeletal: Negative for arthralgias and back pain.  Skin: Positive for color change (some small areas os loss of pigmentation).  Neurological: Negative for light-headedness and headaches.  Psychiatric/Behavioral: Negative for dysphoric mood and sleep disturbance. The patient is not nervous/anxious.        Objective:   Vitals:   05/08/18 0757  BP: 120/70  Pulse: 69  Resp: 16  Temp: 98.7 F (37.1 C)  SpO2: 98%   Filed Weights   05/08/18 0757  Weight: 168 lb (76.2 kg)   Body mass index is 27.96 kg/m.  BP Readings from Last 3 Encounters:  05/08/18 120/70  04/17/18 130/84  04/09/18 122/80     Wt Readings from Last 3 Encounters:  05/08/18 168 lb (76.2 kg)  04/17/18 164 lb (74.4 kg)  04/09/18 163 lb 0.6 oz (74 kg)     Physical Exam Constitutional: She appears well-developed and well-nourished. No distress.  HENT:  Head: Normocephalic and atraumatic.  Right Ear: External ear normal. Normal ear canal and TM Left Ear: External ear normal.  Normal ear canal and TM Mouth/Throat: Oropharynx is clear and moist.  Eyes: Conjunctivae and EOM are normal.  Neck: Neck supple. No tracheal deviation present. No thyromegaly present.  No carotid bruit  Cardiovascular: Normal rate, regular rhythm and normal heart sounds.   No murmur heard.  No edema. Pulmonary/Chest: Effort normal and breath sounds normal. No respiratory distress. She has no wheezes. She has no rales.  Breast:  deferred to Gyn Abdominal: Soft. She exhibits no distension. There is no tenderness.  Lymphadenopathy: She has no cervical adenopathy.  Skin: Skin is warm and dry. She is not diaphoretic.  Psychiatric: She has a normal mood and affect. Her behavior is normal.        Assessment & Plan:   Physical exam: Screening blood work  ordered Immunizations    tdap today, prevnar deferred until a later date, discussed  shingles vaccine Colonoscopy   Up to date  Mammogram    Up to date  Gyn    Up to date  Dexa    Up to date  Eye exams  Up to date  EKG       Done 2003 Exercise   Walking some -- will make walking more regularly Weight   Has gained some, will work on weight loss Skin  - areas os loss of pigmentation on legs - very small and normal/benign  apppearing Substance abuse   none  See Problem List for Assessment and Plan of chronic medical problems.    FU annually

## 2018-05-06 NOTE — Patient Instructions (Addendum)
Test(s) ordered today. Your results will be released to MyChart (or called to you) after review, usually within 72hours after test completion. If any changes need to be made, you will be notified at that same time.  All other Health Maintenance issues reviewed.   All recommended immunizations and age-appropriate screenings are up-to-date or discussed.  Tetanus immunization administered today.   Medications reviewed and updated.  No changes recommended at this time.   Please followup in one year   Health Maintenance, Female Adopting a healthy lifestyle and getting preventive care can go a long way to promote health and wellness. Talk with your health care provider about what schedule of regular examinations is right for you. This is a good chance for you to check in with your provider about disease prevention and staying healthy. In between checkups, there are plenty of things you can do on your own. Experts have done a lot of research about which lifestyle changes and preventive measures are most likely to keep you healthy. Ask your health care provider for more information. Weight and diet Eat a healthy diet  Be sure to include plenty of vegetables, fruits, low-fat dairy products, and lean protein.  Do not eat a lot of foods high in solid fats, added sugars, or salt.  Get regular exercise. This is one of the most important things you can do for your health. ? Most adults should exercise for at least 150 minutes each week. The exercise should increase your heart rate and make you sweat (moderate-intensity exercise). ? Most adults should also do strengthening exercises at least twice a week. This is in addition to the moderate-intensity exercise.  Maintain a healthy weight  Body mass index (BMI) is a measurement that can be used to identify possible weight problems. It estimates body fat based on height and weight. Your health care provider can help determine your BMI and help you  achieve or maintain a healthy weight.  For females 20 years of age and older: ? A BMI below 18.5 is considered underweight. ? A BMI of 18.5 to 24.9 is normal. ? A BMI of 25 to 29.9 is considered overweight. ? A BMI of 30 and above is considered obese.  Watch levels of cholesterol and blood lipids  You should start having your blood tested for lipids and cholesterol at 65 years of age, then have this test every 5 years.  You may need to have your cholesterol levels checked more often if: ? Your lipid or cholesterol levels are high. ? You are older than 65 years of age. ? You are at high risk for heart disease.  Cancer screening Lung Cancer  Lung cancer screening is recommended for adults 55-80 years old who are at high risk for lung cancer because of a history of smoking.  A yearly low-dose CT scan of the lungs is recommended for people who: ? Currently smoke. ? Have quit within the past 15 years. ? Have at least a 30-pack-year history of smoking. A pack year is smoking an average of one pack of cigarettes a day for 1 year.  Yearly screening should continue until it has been 15 years since you quit.  Yearly screening should stop if you develop a health problem that would prevent you from having lung cancer treatment.  Breast Cancer  Practice breast self-awareness. This means understanding how your breasts normally appear and feel.  It also means doing regular breast self-exams. Let your health care provider know about any changes,   no matter how small.  If you are in your 20s or 30s, you should have a clinical breast exam (CBE) by a health care provider every 1-3 years as part of a regular health exam.  If you are 40 or older, have a CBE every year. Also consider having a breast X-ray (mammogram) every year.  If you have a family history of breast cancer, talk to your health care provider about genetic screening.  If you are at high risk for breast cancer, talk to your health  care provider about having an MRI and a mammogram every year.  Breast cancer gene (BRCA) assessment is recommended for women who have family members with BRCA-related cancers. BRCA-related cancers include: ? Breast. ? Ovarian. ? Tubal. ? Peritoneal cancers.  Results of the assessment will determine the need for genetic counseling and BRCA1 and BRCA2 testing.  Cervical Cancer Your health care provider may recommend that you be screened regularly for cancer of the pelvic organs (ovaries, uterus, and vagina). This screening involves a pelvic examination, including checking for microscopic changes to the surface of your cervix (Pap test). You may be encouraged to have this screening done every 3 years, beginning at age 21.  For women ages 30-65, health care providers may recommend pelvic exams and Pap testing every 3 years, or they may recommend the Pap and pelvic exam, combined with testing for human papilloma virus (HPV), every 5 years. Some types of HPV increase your risk of cervical cancer. Testing for HPV may also be done on women of any age with unclear Pap test results.  Other health care providers may not recommend any screening for nonpregnant women who are considered low risk for pelvic cancer and who do not have symptoms. Ask your health care provider if a screening pelvic exam is right for you.  If you have had past treatment for cervical cancer or a condition that could lead to cancer, you need Pap tests and screening for cancer for at least 20 years after your treatment. If Pap tests have been discontinued, your risk factors (such as having a new sexual partner) need to be reassessed to determine if screening should resume. Some women have medical problems that increase the chance of getting cervical cancer. In these cases, your health care provider may recommend more frequent screening and Pap tests.  Colorectal Cancer  This type of cancer can be detected and often  prevented.  Routine colorectal cancer screening usually begins at 65 years of age and continues through 65 years of age.  Your health care provider may recommend screening at an earlier age if you have risk factors for colon cancer.  Your health care provider may also recommend using home test kits to check for hidden blood in the stool.  A small camera at the end of a tube can be used to examine your colon directly (sigmoidoscopy or colonoscopy). This is done to check for the earliest forms of colorectal cancer.  Routine screening usually begins at age 50.  Direct examination of the colon should be repeated every 5-10 years through 65 years of age. However, you may need to be screened more often if early forms of precancerous polyps or small growths are found.  Skin Cancer  Check your skin from head to toe regularly.  Tell your health care provider about any new moles or changes in moles, especially if there is a change in a mole's shape or color.  Also tell your health care provider if you   have a mole that is larger than the size of a pencil eraser.  Always use sunscreen. Apply sunscreen liberally and repeatedly throughout the day.  Protect yourself by wearing long sleeves, pants, a wide-brimmed hat, and sunglasses whenever you are outside.  Heart disease, diabetes, and high blood pressure  High blood pressure causes heart disease and increases the risk of stroke. High blood pressure is more likely to develop in: ? People who have blood pressure in the high end of the normal range (130-139/85-89 mm Hg). ? People who are overweight or obese. ? People who are African American.  If you are 18-39 years of age, have your blood pressure checked every 3-5 years. If you are 40 years of age or older, have your blood pressure checked every year. You should have your blood pressure measured twice-once when you are at a hospital or clinic, and once when you are not at a hospital or clinic.  Record the average of the two measurements. To check your blood pressure when you are not at a hospital or clinic, you can use: ? An automated blood pressure machine at a pharmacy. ? A home blood pressure monitor.  If you are between 55 years and 79 years old, ask your health care provider if you should take aspirin to prevent strokes.  Have regular diabetes screenings. This involves taking a blood sample to check your fasting blood sugar level. ? If you are at a normal weight and have a low risk for diabetes, have this test once every three years after 65 years of age. ? If you are overweight and have a high risk for diabetes, consider being tested at a younger age or more often. Preventing infection Hepatitis B  If you have a higher risk for hepatitis B, you should be screened for this virus. You are considered at high risk for hepatitis B if: ? You were born in a country where hepatitis B is common. Ask your health care provider which countries are considered high risk. ? Your parents were born in a high-risk country, and you have not been immunized against hepatitis B (hepatitis B vaccine). ? You have HIV or AIDS. ? You use needles to inject street drugs. ? You live with someone who has hepatitis B. ? You have had sex with someone who has hepatitis B. ? You get hemodialysis treatment. ? You take certain medicines for conditions, including cancer, organ transplantation, and autoimmune conditions.  Hepatitis C  Blood testing is recommended for: ? Everyone born from 1945 through 1965. ? Anyone with known risk factors for hepatitis C.  Sexually transmitted infections (STIs)  You should be screened for sexually transmitted infections (STIs) including gonorrhea and chlamydia if: ? You are sexually active and are younger than 65 years of age. ? You are older than 65 years of age and your health care provider tells you that you are at risk for this type of infection. ? Your sexual  activity has changed since you were last screened and you are at an increased risk for chlamydia or gonorrhea. Ask your health care provider if you are at risk.  If you do not have HIV, but are at risk, it may be recommended that you take a prescription medicine daily to prevent HIV infection. This is called pre-exposure prophylaxis (PrEP). You are considered at risk if: ? You are sexually active and do not regularly use condoms or know the HIV status of your partner(s). ? You take drugs by injection. ?   You are sexually active with a partner who has HIV.  Talk with your health care provider about whether you are at high risk of being infected with HIV. If you choose to begin PrEP, you should first be tested for HIV. You should then be tested every 3 months for as long as you are taking PrEP. Pregnancy  If you are premenopausal and you may become pregnant, ask your health care provider about preconception counseling.  If you may become pregnant, take 400 to 800 micrograms (mcg) of folic acid every day.  If you want to prevent pregnancy, talk to your health care provider about birth control (contraception). Osteoporosis and menopause  Osteoporosis is a disease in which the bones lose minerals and strength with aging. This can result in serious bone fractures. Your risk for osteoporosis can be identified using a bone density scan.  If you are 65 years of age or older, or if you are at risk for osteoporosis and fractures, ask your health care provider if you should be screened.  Ask your health care provider whether you should take a calcium or vitamin D supplement to lower your risk for osteoporosis.  Menopause may have certain physical symptoms and risks.  Hormone replacement therapy may reduce some of these symptoms and risks. Talk to your health care provider about whether hormone replacement therapy is right for you. Follow these instructions at home:  Schedule regular health, dental,  and eye exams.  Stay current with your immunizations.  Do not use any tobacco products including cigarettes, chewing tobacco, or electronic cigarettes.  If you are pregnant, do not drink alcohol.  If you are breastfeeding, limit how much and how often you drink alcohol.  Limit alcohol intake to no more than 1 drink per day for nonpregnant women. One drink equals 12 ounces of beer, 5 ounces of wine, or 1 ounces of hard liquor.  Do not use street drugs.  Do not share needles.  Ask your health care provider for help if you need support or information about quitting drugs.  Tell your health care provider if you often feel depressed.  Tell your health care provider if you have ever been abused or do not feel safe at home. This information is not intended to replace advice given to you by your health care provider. Make sure you discuss any questions you have with your health care provider. Document Released: 04/25/2011 Document Revised: 03/17/2016 Document Reviewed: 07/14/2015 Elsevier Interactive Patient Education  2018 [employer]  

## 2018-05-08 ENCOUNTER — Encounter: Payer: Self-pay | Admitting: Internal Medicine

## 2018-05-08 ENCOUNTER — Other Ambulatory Visit (INDEPENDENT_AMBULATORY_CARE_PROVIDER_SITE_OTHER): Payer: Federal, State, Local not specified - PPO

## 2018-05-08 ENCOUNTER — Ambulatory Visit (INDEPENDENT_AMBULATORY_CARE_PROVIDER_SITE_OTHER): Payer: Federal, State, Local not specified - PPO | Admitting: Internal Medicine

## 2018-05-08 VITALS — BP 120/70 | HR 69 | Temp 98.7°F | Resp 16 | Ht 65.0 in | Wt 168.0 lb

## 2018-05-08 DIAGNOSIS — Z0000 Encounter for general adult medical examination without abnormal findings: Secondary | ICD-10-CM

## 2018-05-08 DIAGNOSIS — E782 Mixed hyperlipidemia: Secondary | ICD-10-CM | POA: Diagnosis not present

## 2018-05-08 DIAGNOSIS — R739 Hyperglycemia, unspecified: Secondary | ICD-10-CM | POA: Diagnosis not present

## 2018-05-08 DIAGNOSIS — Z23 Encounter for immunization: Secondary | ICD-10-CM | POA: Diagnosis not present

## 2018-05-08 DIAGNOSIS — M85859 Other specified disorders of bone density and structure, unspecified thigh: Secondary | ICD-10-CM

## 2018-05-08 HISTORY — DX: Hyperglycemia, unspecified: R73.9

## 2018-05-08 LAB — CBC WITH DIFFERENTIAL/PLATELET
BASOS ABS: 0 10*3/uL (ref 0.0–0.1)
Basophils Relative: 0.9 % (ref 0.0–3.0)
EOS ABS: 0.1 10*3/uL (ref 0.0–0.7)
Eosinophils Relative: 2.4 % (ref 0.0–5.0)
HCT: 41 % (ref 36.0–46.0)
Hemoglobin: 14.2 g/dL (ref 12.0–15.0)
LYMPHS ABS: 1.4 10*3/uL (ref 0.7–4.0)
Lymphocytes Relative: 37.6 % (ref 12.0–46.0)
MCHC: 34.7 g/dL (ref 30.0–36.0)
MCV: 90.2 fl (ref 78.0–100.0)
Monocytes Absolute: 0.4 10*3/uL (ref 0.1–1.0)
Monocytes Relative: 10 % (ref 3.0–12.0)
NEUTROS PCT: 49.1 % (ref 43.0–77.0)
Neutro Abs: 1.9 10*3/uL (ref 1.4–7.7)
PLATELETS: 256 10*3/uL (ref 150.0–400.0)
RBC: 4.54 Mil/uL (ref 3.87–5.11)
RDW: 13.6 % (ref 11.5–15.5)
WBC: 3.8 10*3/uL — ABNORMAL LOW (ref 4.0–10.5)

## 2018-05-08 LAB — COMPREHENSIVE METABOLIC PANEL
ALT: 16 U/L (ref 0–35)
AST: 18 U/L (ref 0–37)
Albumin: 4.5 g/dL (ref 3.5–5.2)
Alkaline Phosphatase: 70 U/L (ref 39–117)
BILIRUBIN TOTAL: 0.9 mg/dL (ref 0.2–1.2)
BUN: 14 mg/dL (ref 6–23)
CO2: 31 meq/L (ref 19–32)
CREATININE: 0.85 mg/dL (ref 0.40–1.20)
Calcium: 9.3 mg/dL (ref 8.4–10.5)
Chloride: 102 mEq/L (ref 96–112)
GFR: 86.3 mL/min (ref >60.00)
GLUCOSE: 107 mg/dL — AB (ref 70–99)
Potassium: 4 mEq/L (ref 3.5–5.1)
SODIUM: 141 meq/L (ref 135–145)
Total Protein: 7.1 g/dL (ref 6.0–8.3)

## 2018-05-08 LAB — LIPID PANEL
CHOLESTEROL: 232 mg/dL — AB (ref 0–200)
HDL: 82.8 mg/dL (ref >39.00)
LDL Cholesterol: 128 mg/dL — ABNORMAL HIGH (ref 0–99)
NONHDL: 149.47
TRIGLYCERIDES: 108 mg/dL (ref 0.0–149.0)
Total CHOL/HDL Ratio: 3
VLDL: 21.6 mg/dL (ref 0.0–40.0)

## 2018-05-08 LAB — TSH: TSH: 1.63 u[IU]/mL (ref 0.35–4.50)

## 2018-05-08 LAB — HEMOGLOBIN A1C: Hgb A1c MFr Bld: 5.3 % (ref 4.6–6.5)

## 2018-05-08 NOTE — Assessment & Plan Note (Signed)
dexa up to date Continue calc/vitam d Walking for exercise

## 2018-05-08 NOTE — Assessment & Plan Note (Signed)
Check a1c Low sugar / carb diet Stressed regular exercise   

## 2018-05-08 NOTE — Assessment & Plan Note (Signed)
Check lipid panel, cmp, tsh Regular exercise and healthy diet encouraged   

## 2018-09-24 LAB — HM MAMMOGRAPHY

## 2018-10-03 ENCOUNTER — Encounter: Payer: Self-pay | Admitting: Internal Medicine

## 2018-10-04 ENCOUNTER — Telehealth: Payer: Self-pay | Admitting: Internal Medicine

## 2018-10-04 NOTE — Telephone Encounter (Signed)
Copied from [place] [id number]. Topic: General - Inquiry >> Oct 04, 2018 11:34 AM Reyne Dumas L wrote: Reason for CRM:   Pt states that she joined a Taking Off Pounds Sensibility (TOPS) group yesterday Pt states that she needs to bring in a note from her physician about what her ideal weight should be. Pt would like to pick this up when it is ready.  Pt states she goes back to her group on Monday night at 6pm. Pt can be reached at [phone]

## 2018-10-04 NOTE — Telephone Encounter (Signed)
Her weight at 150 would be ideal, but if she thinks that is too low we can write 155.

## 2018-10-04 NOTE — Telephone Encounter (Signed)
Please advise. Last OV was 05/08/18 for CPE.

## 2018-11-14 ENCOUNTER — Ambulatory Visit: Payer: Federal, State, Local not specified - PPO | Admitting: Internal Medicine

## 2018-11-14 ENCOUNTER — Encounter: Payer: Self-pay | Admitting: Internal Medicine

## 2018-11-14 ENCOUNTER — Other Ambulatory Visit (INDEPENDENT_AMBULATORY_CARE_PROVIDER_SITE_OTHER): Payer: Federal, State, Local not specified - PPO

## 2018-11-14 ENCOUNTER — Ambulatory Visit (INDEPENDENT_AMBULATORY_CARE_PROVIDER_SITE_OTHER)
Admission: RE | Admit: 2018-11-14 | Discharge: 2018-11-14 | Disposition: A | Discharge status: Home / Self Care | Payer: Federal, State, Local not specified - PPO | Source: Ambulatory Visit | Attending: Internal Medicine | Admitting: Internal Medicine

## 2018-11-14 VITALS — BP 124/72 | HR 63 | Temp 98.1°F | Ht 65.0 in | Wt 171.0 lb

## 2018-11-14 DIAGNOSIS — R0789 Other chest pain: Secondary | ICD-10-CM

## 2018-11-14 DIAGNOSIS — F439 Reaction to severe stress, unspecified: Secondary | ICD-10-CM

## 2018-11-14 HISTORY — DX: Reaction to severe stress, unspecified: F43.9

## 2018-11-14 HISTORY — DX: Other chest pain: R07.89

## 2018-11-14 LAB — CBC WITH DIFFERENTIAL/PLATELET
BASOS PCT: 1 % (ref 0.0–3.0)
Basophils Absolute: 0 10*3/uL (ref 0.0–0.1)
Eosinophils Absolute: 0.1 10*3/uL (ref 0.0–0.7)
Eosinophils Relative: 3.3 % (ref 0.0–5.0)
HEMATOCRIT: 40.3 % (ref 36.0–46.0)
Hemoglobin: 14 g/dL (ref 12.0–15.0)
Lymphocytes Relative: 43.1 % (ref 12.0–46.0)
Lymphs Abs: 1.6 10*3/uL (ref 0.7–4.0)
MCHC: 34.8 g/dL (ref 30.0–36.0)
MCV: 90.5 fl (ref 78.0–100.0)
Monocytes Absolute: 0.4 10*3/uL (ref 0.1–1.0)
Monocytes Relative: 10 % (ref 3.0–12.0)
NEUTROS ABS: 1.6 10*3/uL (ref 1.4–7.7)
Neutrophils Relative %: 42.6 % — ABNORMAL LOW (ref 43.0–77.0)
PLATELETS: 274 10*3/uL (ref 150.0–400.0)
RBC: 4.46 Mil/uL (ref 3.87–5.11)
RDW: 13.5 % (ref 11.5–15.5)
WBC: 3.7 10*3/uL — AB (ref 4.0–10.5)

## 2018-11-14 LAB — BASIC METABOLIC PANEL
BUN: 13 mg/dL (ref 6–23)
CO2: 30 mEq/L (ref 19–32)
Calcium: 9.5 mg/dL (ref 8.4–10.5)
Chloride: 102 mEq/L (ref 96–112)
Creatinine, Ser: 0.83 mg/dL (ref 0.40–1.20)
GFR: 83.33 mL/min (ref >60.00)
GLUCOSE: 94 mg/dL (ref 70–99)
Potassium: 3.9 mEq/L (ref 3.5–5.1)
SODIUM: 139 meq/L (ref 135–145)

## 2018-11-14 LAB — CARDIAC PANEL
CK-MB: 2.9 ng/mL (ref 0.3–4.0)
Relative Index: 2.2 calc (ref 0.0–2.5)
Total CK: 131 U/L (ref 7–177)

## 2018-11-14 MED ORDER — DEXLANSOPRAZOLE 60 MG PO CPDR: 60.0000 mg | DELAYED_RELEASE_CAPSULE | Freq: Every day | ORAL | 3 refills | Status: DC

## 2018-11-14 NOTE — Assessment & Plan Note (Signed)
stress w/dtr who has ovarian cancer

## 2018-11-14 NOTE — Assessment & Plan Note (Signed)
stress w/dtr who has ovarian cancer - discussed

## 2018-11-14 NOTE — Progress Notes (Signed)
Subjective:  Patient ID: Lori [name], female    DOB: [birth year]  Age: 68 y.o. MRN: [id number]  CC: No chief complaint on file.   HPI Lori [name] presents for chest tightness episode last night after dinner - not better this am. Pt took 2 aspirins CP w/rest or movement - no change. No other sx's C/o stress w/dtr who has ovarian cancer  Outpatient Medications Prior to Visit  Medication Sig Dispense Refill  . CALCIUM-VITAMIN D PO Take by mouth.    . Cyanocobalamin (VITAMIN B-12 PO) Take by mouth.    . Multiple Vitamins-Minerals (CENTRUM ULTRA WOMENS PO) Take by mouth.    Marland Kitchen PREMARIN vaginal cream INSERT 1/2 GRAM VAGINALLY 2 TIMES A WEEK  2   No facility-administered medications prior to visit.     ROS: Review of Systems  Constitutional: Negative for activity change, appetite change, chills, fatigue and unexpected weight change.  HENT: Negative for congestion, mouth sores and sinus pressure.   Eyes: Negative for visual disturbance.  Respiratory: Negative for cough and chest tightness.   Cardiovascular: Positive for chest pain.  Gastrointestinal: Negative for abdominal pain and nausea.  Genitourinary: Negative for difficulty urinating, frequency and vaginal pain.  Musculoskeletal: Positive for back pain. Negative for gait problem.  Skin: Negative for pallor and rash.  Neurological: Negative for dizziness, tremors, weakness, numbness and headaches.  Psychiatric/Behavioral: Negative for confusion, sleep disturbance and suicidal ideas. The patient is not nervous/anxious.   stress w/dtr  Objective:  BP 124/72 (BP Location: Left Arm, Patient Position: Sitting, Cuff Size: Normal)   Pulse 63   Temp 98.1 F (36.7 C) (Oral)   Ht 5\' 5"  (1.651 m)   Wt 171 lb (77.6 kg)   SpO2 98%   BMI 28.46 kg/m   BP Readings from Last 3 Encounters:  11/14/18 124/72  05/08/18 120/70  04/17/18 130/84    Wt Readings from Last 3 Encounters:  11/14/18 171 lb (77.6 kg)  05/08/18 168 lb (76.2  kg)  04/17/18 164 lb (74.4 kg)    Physical Exam Constitutional:      General: She is not in acute distress.    Appearance: She is well-developed.  HENT:     Head: Normocephalic.     Right Ear: External ear normal.     Left Ear: External ear normal.     Nose: Nose normal.  Eyes:     General:        Right eye: No discharge.        Left eye: No discharge.     Conjunctiva/sclera: Conjunctivae normal.     Pupils: Pupils are equal, round, and reactive to light.  Neck:     Musculoskeletal: Normal range of motion and neck supple.     Thyroid: No thyromegaly.     Vascular: No JVD.     Trachea: No tracheal deviation.  Cardiovascular:     Rate and Rhythm: Normal rate and regular rhythm.     Heart sounds: Normal heart sounds.  Pulmonary:     Effort: No respiratory distress.     Breath sounds: No stridor. No wheezing.  Abdominal:     General: Bowel sounds are normal. There is no distension.     Palpations: Abdomen is soft. There is no mass.     Tenderness: There is no abdominal tenderness. There is no guarding or rebound.  Musculoskeletal:        General: No tenderness.  Lymphadenopathy:     Cervical: No cervical adenopathy.  Skin:    Findings: No erythema or rash.  Neurological:     Cranial Nerves: No cranial nerve deficit.     Motor: No abnormal muscle tone.     Coordination: Coordination normal.     Deep Tendon Reflexes: Reflexes normal.  Psychiatric:        Behavior: Behavior normal.        Thought Content: Thought content normal.        Judgment: Judgment normal.     chest wall - slightly tender Calves NT  Procedure: EKG Indication: chest pain Impression: S brady. No acute changes.   Lab Results  Component Value Date   WBC 3.8 (L) 05/08/2018   HGB 14.2 05/08/2018   HCT 41.0 05/08/2018   PLT 256.0 05/08/2018   GLUCOSE 107 (H) 05/08/2018   CHOL 232 (H) 05/08/2018   TRIG 108.0 05/08/2018   HDL 82.80 05/08/2018   LDLCALC 128 (H) 05/08/2018   ALT 16 05/08/2018    AST 18 05/08/2018   NA 141 05/08/2018   K 4.0 05/08/2018   CL 102 05/08/2018   CREATININE 0.85 05/08/2018   BUN 14 05/08/2018   CO2 31 05/08/2018   TSH 1.63 05/08/2018   HGBA1C 5.3 05/08/2018    No results found.  Assessment & Plan:   There are no diagnoses linked to this encounter.   No orders of the defined types were placed in this encounter.    Follow-up: No follow-ups on file.  Walker Kehr, MD

## 2018-11-14 NOTE — Patient Instructions (Addendum)
Go to ER if worse See Dr Quay Burow in 1 week

## 2018-11-15 LAB — D-DIMER, QUANTITATIVE: D-Dimer, Quant: 0.32 mcg/mL FEU (ref <0.50)

## 2018-11-22 ENCOUNTER — Ambulatory Visit: Payer: Federal, State, Local not specified - PPO | Admitting: Family Medicine

## 2018-11-22 ENCOUNTER — Other Ambulatory Visit: Payer: Self-pay

## 2018-11-22 ENCOUNTER — Ambulatory Visit (INDEPENDENT_AMBULATORY_CARE_PROVIDER_SITE_OTHER)
Admission: RE | Admit: 2018-11-22 | Discharge: 2018-11-22 | Disposition: A | Discharge status: Home / Self Care | Payer: Federal, State, Local not specified - PPO | Source: Ambulatory Visit | Attending: Family Medicine | Admitting: Family Medicine

## 2018-11-22 ENCOUNTER — Encounter: Payer: Self-pay | Admitting: Family Medicine

## 2018-11-22 VITALS — BP 148/88 | HR 99 | Temp 101.9°F | Ht 65.0 in | Wt 172.1 lb

## 2018-11-22 DIAGNOSIS — R059 Cough, unspecified: Secondary | ICD-10-CM

## 2018-11-22 DIAGNOSIS — R6889 Other general symptoms and signs: Secondary | ICD-10-CM

## 2018-11-22 DIAGNOSIS — R05 Cough: Secondary | ICD-10-CM

## 2018-11-22 LAB — POC INFLUENZA A&B (BINAX/QUICKVUE)
INFLUENZA A, POC: NEGATIVE
Influenza B, POC: NEGATIVE

## 2018-11-22 MED ORDER — OSELTAMIVIR PHOSPHATE 75 MG PO CAPS: 75.0000 mg | ORAL_CAPSULE | Freq: Two times a day (BID) | ORAL | 0 refills | Status: DC

## 2018-11-22 NOTE — Progress Notes (Signed)
Patient ID: Lori [name], female   DOB: 1951/09/06, 68 y.o.   MRN: [id number]  PCP: Binnie Rail, MD  Subjective:  Lori [name] is a 68 y.o. year old very pleasant female patient who presents with flu like symptoms including fever,body aches, and intermittent cough that is productive of green sputum  -other symptoms: HA, chills, mild nausea, no vomiting -started: 2 days ago  -inside 48 hour treatment window if needed for tamiflu: Yes -high risk condition (children <5, adults >65, chronic pulmonary or cardiac condition, immunosuppression, pregnancy, nursing home resident, morbid obesity) : Yes -symptoms are not improving -previous treatments: acetaminophen, ibuprofen, theraflu and robitussin have provided limited benefit. She has not been taking acetaminophen and ibuprofen on a scheduled basis - patient did not receive flu shot this year.  -  sick contact; specifically influenza: No known contact. She has been taking her daughter to [place] for treatments. She is drinking fluids and urinating Former smoker with quit date 11/01/1993  ROS-denies SOB, ear pain, sore throat, vomiting, diarrhea, sinus or dental pain  Pertinent Past Medical History-  Patient Active Problem List   Diagnosis Date Noted  . Chest pain, atypical 11/14/2018  . Stress at home 11/14/2018  . Hyperglycemia 05/08/2018  . Vertigo 04/17/2018  . Allergic reaction 01/03/2018  . Left arm numbness 11/21/2017  . Numbness and tingling 11/21/2017  . Pyelonephritis 05/02/2017  . Hyperlipidemia 04/27/2017  . Right hand pain 01/19/2017  . Personal history of colonic polyps 06/06/2014  . Family history of colon cancer 06/06/2014  . Osteopenia 11/04/2013  . Neuropathy of hand 04/28/2013    Medications- reviewed  Current Outpatient Medications  Medication Sig Dispense Refill  . CALCIUM-VITAMIN D PO Take by mouth.    . Cyanocobalamin (VITAMIN B-12 PO) Take by mouth.    . dexlansoprazole (DEXILANT) 60 MG capsule Take 1  capsule (60 mg total) by mouth daily. 30 capsule 3  . Multiple Vitamins-Minerals (CENTRUM ULTRA WOMENS PO) Take by mouth.    Marland Kitchen PREMARIN vaginal cream INSERT 1/2 GRAM VAGINALLY 2 TIMES A WEEK  2   No current facility-administered medications for this visit.     Objective: BP (!) 148/88 (BP Location: Left Arm, Patient Position: Sitting, Cuff Size: Normal)   Pulse (!) 111   Temp (!) 101.9 F (38.8 C) (Oral)   Ht 5\' 5"  (1.651 m)   Wt 172 lb 1.9 oz (78.1 kg)   SpO2 96%   BMI 28.64 kg/m  Retake of HR: 99 Gen: NAD, appears fatigued HEENT: Turbinates erythematous, TMs normal bilaterally, oropharynx mildly erythematous with no tonsilar exudate or edema, no sinus tenderness CV: RRR no murmurs rubs or gallops Lungs: CTAB no crackles, wheeze, rhonchi Abdomen: soft/nontender/nondistended/normal bowel sounds. Ext: no edema Skin: warm, dry, no rash  Assessment/Plan:  Flu-like illness:  History and exam today are suggestive of viral process. Patients influenza test was negative today.  Pretest probability of influenza was high and with symptoms particularly body aches and fever as primary concerns for patient and most consistent with influenza, will treat with Tamiflu today. No vaccine this season. She has also been traveling to hospital with her daughter who is receiving treatments.  High risk condition: 68 years of age Lungs CTA today, patient stated that her chest was tight and productive cough with fever, will obtain a chest X-ray today also. Low suspicion for pneumonia but will check X-ray today to rule this out. Patient will be treated with Tamiflu. Prophylaxis for other [facility] patients: No.  Symptomatic treatment with: Ibuprofen and acetaminophen for fever, chills, body aches.  Advised hydration, rest, bland diet, and provided close return precautions  Finally, we reviewed reasons to return to care including if symptoms worsen or persist or new concerns arise. Close return precautions  provided.    Lori Quint, FNP

## 2018-11-22 NOTE — Patient Instructions (Addendum)
Please go to X-ray before you leave.  A medication has been sent to your pharmacy for flu symptoms.  You can use ibuprofen 600 mg every 6 hours with food and acetaminophen for symptoms. Please check the dose of acetaminophen that you are taking and do not exceed dose on package instructions.  It is important to drink water and remain hydrated.  If symptoms do not improve, worsen, or new symptoms develop, follow up for further evaluation and treatment.   Influenza, Adult Influenza is also called "the flu." It is an infection in the lungs, nose, and throat (respiratory tract). It is caused by a virus. The flu causes symptoms that are similar to symptoms of a cold. It also causes a high fever and body aches. The flu spreads easily from person to person (is contagious). Getting a flu shot (influenza vaccination) every year is the best way to prevent the flu. What are the causes? This condition is caused by the influenza virus. You can get the virus by:  Breathing in droplets that are in the air from the cough or sneeze of a person who has the virus.  Touching something that has the virus on it (is contaminated) and then touching your mouth, nose, or eyes. What increases the risk? Certain things may make you more likely to get the flu. These include:  Not washing your hands often.  Having close contact with many people during cold and flu season.  Touching your mouth, eyes, or nose without first washing your hands.  Not getting a flu shot every year. You may have a higher risk for the flu, along with serious problems such as a lung infection (pneumonia), if you:  Are older than 65.  Are pregnant.  Have a weakened disease-fighting system (immune system) because of a disease or taking certain medicines.  Have a long-term (chronic) illness, such as: ? Heart, kidney, or lung disease. ? Diabetes. ? Asthma.  Have a liver disorder.  Are very overweight (morbidly obese).  Have  anemia. This is a condition that affects your red blood cells. What are the signs or symptoms? Symptoms usually begin suddenly and last 4-14 days. They may include:  Fever and chills.  Headaches, body aches, or muscle aches.  Sore throat.  Cough.  Runny or stuffy (congested) nose.  Chest discomfort.  Not wanting to eat as much as normal (poor appetite).  Weakness or feeling tired (fatigue).  Dizziness.  Feeling sick to your stomach (nauseous) or throwing up (vomiting). How is this treated? If the flu is found early, you can be treated with medicine that can help reduce how bad the illness is and how long it lasts (antiviral medicine). This may be given by mouth (orally) or through an IV tube. Taking care of yourself at home can help your symptoms get better. Your doctor may suggest:  Taking over-the-counter medicines.  Drinking plenty of fluids. The flu often goes away on its own. If you have very bad symptoms or other problems, you may be treated in a hospital. Follow these instructions at home:     Activity  Rest as needed. Get plenty of sleep.  Stay home from work or school as told by your doctor. ? Do not leave home until you do not have a fever for 24 hours without taking medicine. ? Leave home only to visit your doctor. Eating and drinking  Take an ORS (oral rehydration solution). This is a drink that is sold at pharmacies and stores.  Drink enough fluid to keep your pee (urine) pale yellow.  Drink clear fluids in small amounts as you are able. Clear fluids include: ? Water. ? Ice chips. ? Fruit juice that has water added (diluted fruit juice). ? Low-calorie sports drinks.  Eat bland, easy-to-digest foods in small amounts as you are able. These foods include: ? Bananas. ? Applesauce. ? Rice. ? Lean meats. ? Toast. ? Crackers.  Do not eat or drink: ? Fluids that have a lot of sugar or caffeine. ? Alcohol. ? Spicy or fatty foods. General  instructions  Take over-the-counter and prescription medicines only as told by your doctor.  Use a cool mist humidifier to add moisture to the air in your home. This can make it easier for you to breathe.  Cover your mouth and nose when you cough or sneeze.  Wash your hands with soap and water often, especially after you cough or sneeze. If you cannot use soap and water, use alcohol-based hand sanitizer.  Keep all follow-up visits as told by your doctor. This is important. How is this prevented?   Get a flu shot every year. You may get the flu shot in late summer, fall, or winter. Ask your doctor when you should get your flu shot.  Avoid contact with people who are sick during fall and winter (cold and flu season). Contact a doctor if:  You get new symptoms.  You have: ? Chest pain. ? Watery poop (diarrhea). ? A fever.  Your cough gets worse.  You start to have more mucus.  You feel sick to your stomach.  You throw up. Get help right away if you:  Have shortness of breath.  Have trouble breathing.  Have skin or nails that turn a bluish color.  Have very bad pain or stiffness in your neck.  Get a sudden headache.  Get sudden pain in your face or ear.  Cannot eat or drink without throwing up. Summary  Influenza ("the flu") is an infection in the lungs, nose, and throat. It is caused by a virus.  Take over-the-counter and prescription medicines only as told by your doctor.  Getting a flu shot every year is the best way to avoid getting the flu. This information is not intended to replace advice given to you by your health care provider. Make sure you discuss any questions you have with your health care provider. Document Released: 07/19/2008 Document Revised: 03/28/2018 Document Reviewed: 03/28/2018 Elsevier Interactive Patient Education  2019 [employer].

## 2018-11-24 NOTE — Progress Notes (Signed)
Subjective:    Patient ID: Lori [name], female    DOB: 22-Oct-1951, 68 y.o.   MRN: [id number]  HPI She is here for an acute visit for cold symptoms.  She is feeling better, but is still having low-grade fevers and coughing.  Her fevers have improved, but have been persistent.  She is having coughing spells.  She is bringing up white sputum.  Yesterday her left back started to hurt.  She still states decreased appetite.  She denies any cold symptoms such as nasal congestion, sore throat or ear pain.  She denies any wheezing, shortness of breath, headaches and lightheadedness.  She denies urinary symptoms.  She continues to take Tylenol and ibuprofen along with cough suppressants.  She is concerned because she does not feel like she is getting better and her fever is persistent despite everything she is doing.  She has 2 days left of the Tamiflu.  She is using her daughter's incentive spirometry at home.   Medications and allergies reviewed with patient and updated if appropriate.  Patient Active Problem List   Diagnosis Date Noted  . Chest pain, atypical 11/14/2018  . Stress at home 11/14/2018  . Hyperglycemia 05/08/2018  . Vertigo 04/17/2018  . Allergic reaction 01/03/2018  . Left arm numbness 11/21/2017  . Numbness and tingling 11/21/2017  . Pyelonephritis 05/02/2017  . Hyperlipidemia 04/27/2017  . Right hand pain 01/19/2017  . Personal history of colonic polyps 06/06/2014  . Family history of colon cancer 06/06/2014  . Osteopenia 11/04/2013  . Neuropathy of hand 04/28/2013    Current Outpatient Medications on File Prior to Visit  Medication Sig Dispense Refill  . CALCIUM-VITAMIN D PO Take by mouth.    . Cyanocobalamin (VITAMIN B-12 PO) Take by mouth.    . dexlansoprazole (DEXILANT) 60 MG capsule Take 1 capsule (60 mg total) by mouth daily. 30 capsule 3  . Multiple Vitamins-Minerals (CENTRUM ULTRA WOMENS PO) Take by mouth.    . oseltamivir (TAMIFLU) 75 MG capsule Take 1  capsule (75 mg total) by mouth 2 (two) times daily. 10 capsule 0  . PREMARIN vaginal cream INSERT 1/2 GRAM VAGINALLY 2 TIMES A WEEK  2   No current facility-administered medications on file prior to visit.     Past Medical History:  Diagnosis Date  . CTS (carpal tunnel syndrome)    chronic  . Depression 01/16/2013  . Osteopenia   . PONV (postoperative nausea and vomiting)   . Tubular adenoma   . UTI (urinary tract infection)     Past Surgical History:  Procedure Laterality Date  . ABDOMINAL HYSTERECTOMY  1994   fibroids  . APPENDECTOMY    . BUNIONECTOMY Right   . BUNIONECTOMY Left 2018  . CHOLECYSTECTOMY    . COLONOSCOPY  12/09/2008   Dr.Rourk- normal rectum, polyp at the hepatic flexure bx= tubular adenoma. the remainder of the colonic mucosa and terminal ileum mucosa appeared normal.  . COLONOSCOPY  2006   Dr.Sam [place]- colonic adenoma  . COLONOSCOPY N/A 06/25/2014   Procedure: COLONOSCOPY;  Surgeon: Daneil Dolin, MD;  Location: [facility];  Service: Endoscopy;  Laterality: N/A;  1030 DR REQUEST TIME  . KNEE ARTHROSCOPY Right    torn meniscus  . ORIF TOE FRACTURE Right   . rotator Cuff surgery    . TUBAL LIGATION      Social History   Socioeconomic History  . Marital status: Widowed    Spouse name: Not on file  .  Number of children: 2  . Years of education: Not on file  . Highest education level: Not on file  Occupational History  . Not on file  Social Needs  . Financial resource strain: Not on file  . Food insecurity:    Worry: Not on file    Inability: Not on file  . Transportation needs:    Medical: Not on file    Non-medical: Not on file  Tobacco Use  . Smoking status: Former Smoker    Packs/day: 1.00    Years: 10.00    Pack years: 10.00    Types: Cigarettes    Start date: 04/13/1971    Last attempt to quit: 11/01/1993    Years since quitting: 25.0  . Smokeless tobacco: Never Used  Substance and Sexual Activity  . Alcohol use: Yes     Alcohol/week: 0.0 standard drinks    Comment: wine  . Drug use: No  . Sexual activity: Yes    Birth control/protection: Surgical  Lifestyle  . Physical activity:    Days per week: Not on file    Minutes per session: Not on file  . Stress: Not on file  Relationships  . Social connections:    Talks on phone: Not on file    Gets together: Not on file    Attends religious service: Not on file    Active member of club or organization: Not on file    Attends meetings of clubs or organizations: Not on file    Relationship status: Not on file  Other Topics Concern  . Not on file  Social History Narrative   Exercising regularly - senior exercises, walking    Family History  Problem Relation Age of Onset  . Bipolar disorder Sister   . Schizophrenia Sister   . Kidney disease Sister   . Hypertension Sister   . Bipolar disorder Sister   . Kidney disease Sister   . Hypertension Mother   . Mental illness Mother   . Dementia Mother   . Cancer Father 52       colon; bladder  . Hypertension Father   . Heart disease Father   . Arthritis Father   . Bipolar disorder Son     Review of Systems  Constitutional: Positive for appetite change (decreased) and fever.  HENT: Negative for congestion, ear pain, sinus pain and sore throat.   Respiratory: Positive for cough (productive white sputum). Negative for shortness of breath and wheezing.        Can not take a deep breath  Gastrointestinal: Negative for constipation and diarrhea.  Genitourinary: Negative for dysuria, frequency and hematuria.  Musculoskeletal: Positive for back pain (left sided). Negative for myalgias.  Neurological: Negative for dizziness, light-headedness and headaches.       Objective:   Vitals:   11/26/18 0839  BP: (!) 148/78  Pulse: 76  Resp: 16  Temp: 99.6 F (37.6 C)  SpO2: 95%   Filed Weights   11/26/18 0839  Weight: 166 lb (75.3 kg)   Body mass index is 27.62 kg/m.  Wt Readings from Last 3  Encounters:  11/26/18 166 lb (75.3 kg)  11/22/18 172 lb 1.9 oz (78.1 kg)  11/14/18 171 lb (77.6 kg)     Physical Exam GENERAL APPEARANCE: Appears stated age, well appearing, NAD EYES: conjunctiva clear, no icterus HEENT: bilateral tympanic membranes and ear canals normal, oropharynx with mild erythema, no thyromegaly, trachea midline, no cervical or supraclavicular lymphadenopathy LUNGS: Unlabored breathing, good air  entry bilaterally, crackles right base and left midlung, otherwise clear CARDIOVASCULAR: Normal S1,S2 without murmurs, no edema SKIN: warm, dry        Assessment & Plan:   See Problem List for Assessment and Plan of chronic medical problems.

## 2018-11-26 ENCOUNTER — Encounter: Payer: Self-pay | Admitting: Internal Medicine

## 2018-11-26 ENCOUNTER — Ambulatory Visit: Payer: Federal, State, Local not specified - PPO | Admitting: Internal Medicine

## 2018-11-26 VITALS — BP 148/78 | HR 76 | Temp 99.6°F | Resp 16 | Ht 65.0 in | Wt 166.0 lb

## 2018-11-26 DIAGNOSIS — J209 Acute bronchitis, unspecified: Secondary | ICD-10-CM | POA: Diagnosis not present

## 2018-11-26 DIAGNOSIS — B349 Viral infection, unspecified: Secondary | ICD-10-CM | POA: Diagnosis not present

## 2018-11-26 MED ORDER — AZITHROMYCIN 250 MG PO TABS: ORAL_TABLET | ORAL | 0 refills | Status: DC

## 2018-11-26 MED ORDER — PROMETHAZINE-DM 6.25-15 MG/5ML PO SYRP: 5.0000 mL | ORAL_SOLUTION | Freq: Four times a day (QID) | ORAL | 0 refills | Status: DC | PRN

## 2018-11-26 NOTE — Assessment & Plan Note (Signed)
Low-grade fevers and productive cough concerning for bacterial respiratory infection Z-Pak Promethazine-dextromethorphan cough syrup Rest, fluids Call if no improvement

## 2018-11-26 NOTE — Patient Instructions (Signed)
Take the antibiotic as prescribed - complete the entire course.  Use the cough syrup as needed.  Continue over the counter cold medication, advil and tylenol.  Increase your fluids and rest.    Call if no improvement    

## 2018-11-26 NOTE — Assessment & Plan Note (Signed)
Seen twice last week for flulike symptoms Blood work, chest x-ray x2- for acute illness Started on Tamiflu-has 2 days left Most of her flulike symptoms have improved, but still having low-grade fevers and cough Concern for bacterial infection/acute bronchitis We will treat with antibiotic Continue supportive measures Complete Tamiflu

## 2019-01-04 ENCOUNTER — Telehealth: Payer: Self-pay | Admitting: Internal Medicine

## 2019-01-04 MED ORDER — ALPRAZOLAM 0.25 MG PO TABS: 0.2500 mg | ORAL_TABLET | Freq: Three times a day (TID) | ORAL | 0 refills | Status: DC | PRN

## 2019-01-04 NOTE — Telephone Encounter (Signed)
Xanax sent to pharmacy.  Use as needed only  - this is addicting but very effective and starts working right away

## 2019-01-04 NOTE — Telephone Encounter (Signed)
Pt aware of response.  

## 2019-01-04 NOTE — Telephone Encounter (Signed)
Copied from [place] [id number]. Topic: Quick Communication - See Telephone Encounter >> Jan 04, 2019 12:57 PM Blase Mess A wrote: CRM for notification. See Telephone encounter for: 01/04/19.  Patient is calling because her mother is passing she is wanting some medication for anxiety. Offered an appt for Monday declined. Please advise. Thank you

## 2019-02-21 ENCOUNTER — Encounter: Payer: Self-pay | Admitting: Internal Medicine

## 2019-02-21 ENCOUNTER — Ambulatory Visit (INDEPENDENT_AMBULATORY_CARE_PROVIDER_SITE_OTHER): Payer: Federal, State, Local not specified - PPO | Admitting: Internal Medicine

## 2019-02-21 DIAGNOSIS — R200 Anesthesia of skin: Secondary | ICD-10-CM | POA: Diagnosis not present

## 2019-02-21 MED ORDER — GABAPENTIN 100 MG PO CAPS: 100.0000 mg | ORAL_CAPSULE | Freq: Every day | ORAL | 5 refills | Status: DC

## 2019-02-21 NOTE — Progress Notes (Signed)
Virtual Visit via Video Note  I connected with Lori [name] on 02/21/19 at 11:15 AM EDT by a video enabled telemedicine application and verified that I am speaking with the correct person using two identifiers.   I discussed the limitations of evaluation and management by telemedicine and the availability of in person appointments. The patient expressed understanding and agreed to proceed.  The patient is currently at home and I am in the office.    No referring provider.    History of Present Illness: This is an acute visit for finger numbness.  She is having a burning and tingling sensation in all 5 of her left fingers.  It is constant.  She denies pain or weakness, but putting on her earings and buttoning a shirt is difficult.  It started 2 weeks ago.  There was no obvious cause - no new activity or injury.  She denies pain in her neck or anywhere in her left arm.   Nothing makes it better.  If she hold her phone with the left hand it increases the sensation, but nothing else makes it works.  .     She is walking regularly and doing a lot of yard work, but nothing she has not been doing all along.   Review of Systems  Constitutional: Negative for chills and fever.  Musculoskeletal: Negative for joint pain and neck pain.  Skin: Negative for rash.  Neurological: Positive for tingling. Negative for focal weakness.     Social History   Socioeconomic History  . Marital status: Widowed    Spouse name: Not on file  . Number of children: 2  . Years of education: Not on file  . Highest education level: Not on file  Occupational History  . Not on file  Social Needs  . Financial resource strain: Not on file  . Food insecurity:    Worry: Not on file    Inability: Not on file  . Transportation needs:    Medical: Not on file    Non-medical: Not on file  Tobacco Use  . Smoking status: Former Smoker    Packs/day: 1.00    Years: 10.00    Pack years: 10.00    Types: Cigarettes     Start date: 04/13/1971    Last attempt to quit: 11/01/1993    Years since quitting: 25.3  . Smokeless tobacco: Never Used  Substance and Sexual Activity  . Alcohol use: Yes    Alcohol/week: 0.0 standard drinks    Comment: wine  . Drug use: No  . Sexual activity: Yes    Birth control/protection: Surgical  Lifestyle  . Physical activity:    Days per week: Not on file    Minutes per session: Not on file  . Stress: Not on file  Relationships  . Social connections:    Talks on phone: Not on file    Gets together: Not on file    Attends religious service: Not on file    Active member of club or organization: Not on file    Attends meetings of clubs or organizations: Not on file    Relationship status: Not on file  Other Topics Concern  . Not on file  Social History Narrative   Exercising regularly - senior exercises, walking     Observations/Objective: Appears well in NAD   Assessment and Plan:  See Problem List for Assessment and Plan of chronic medical problems.   Follow Up Instructions:    I discussed  the assessment and treatment plan with the patient. The patient was provided an opportunity to ask questions and all were answered. The patient agreed with the plan and demonstrated an understanding of the instructions.   The patient was advised to call back or seek an in-person evaluation if the symptoms worsen or if the condition fails to improve as anticipated.    Binnie Rail, MD

## 2019-02-21 NOTE — Assessment & Plan Note (Addendum)
The sensation is very bothersome - will try gabapentin to see if that helps a little This episode started two weeks ago, but she has had similar symptoms in the past in her LUE Start 100 mg at HS - we can increase this if tolerated Will refer to neurology for further evaluation - she understand what a nerve study is and that she may not be able to have one at this time  She call call with any questions or concerns or if she wants to titrate the gabapentin.  Discussed possible side effects

## 2019-02-28 NOTE — Progress Notes (Signed)
New Patient Virtual Visit via Video Note The purpose of this virtual visit is to provide medical care while limiting exposure to the novel coronavirus.    Consent was obtained for video visit:  Yes.   Answered questions that patient had about telehealth interaction:  Yes.   I discussed the limitations, risks, security and privacy concerns of performing an evaluation and management service by telemedicine. I also discussed with the patient that there may be a patient responsible charge related to this service. The patient expressed understanding and agreed to proceed.  Pt location: Home Physician Location: office Name of referring provider:  Binnie Rail, MD I connected with Lori [name] at patients initiation/request on 03/04/2019 at  1:00 PM EDT by video enabled telemedicine application and verified that I am speaking with the correct person using two identifiers. Pt MRN:  [id number] Pt DOB:  [birth year] Video Participants:  Lori [name]    History of Present Illness: Lori [name] is a 68 y.o. right-handed African American female with anxiety/depressionCTS  presenting for evaluation of left hand numbness/tingling.   Starting around early April, she began having tingling over the left hand involving all of the fingers and thumb.  Tingling is constant and worse with repetitive hand movements, such as when gardening.  Tingling does not ever completely go away and does not wake her up from sleeping.  She denies any weakness of the hands.  The symptoms are more bothersome than actual pain.  She reports having history of bilateral carpal tunnel syndrome many years ago and was wearing wrist splints.  She previously worked at the [employer] and retired in 2009.  She does not wear wrist splint currently.  She was offered gabapentin 100 mg at bedtime which she tried for 2 days and then stopped, after reading side effects.  She did not appreciate any benefit with gabapentin.  She denies any neck pain  or radicular hand pain.  Out-side paper records, electronic medical record, and images have been reviewed where available and summarized as:  Lab Results  Component Value Date   HGBA1C 5.3 05/08/2018   No results found for: [id number] Lab Results  Component Value Date   TSH 1.63 05/08/2018   No results found for: ESRSEDRATE, POCTSEDRATE  Past Medical History:  Diagnosis Date  . CTS (carpal tunnel syndrome)    chronic  . Depression 01/16/2013  . Osteopenia   . PONV (postoperative nausea and vomiting)   . Tubular adenoma   . UTI (urinary tract infection)     Past Surgical History:  Procedure Laterality Date  . ABDOMINAL HYSTERECTOMY  1994   fibroids  . APPENDECTOMY    . BUNIONECTOMY Right   . BUNIONECTOMY Left 2018  . CHOLECYSTECTOMY    . COLONOSCOPY  12/09/2008   Dr.Rourk- normal rectum, polyp at the hepatic flexure bx= tubular adenoma. the remainder of the colonic mucosa and terminal ileum mucosa appeared normal.  . COLONOSCOPY  2006   Dr.Sam [place]- colonic adenoma  . COLONOSCOPY N/A 06/25/2014   Procedure: COLONOSCOPY;  Surgeon: Daneil Dolin, MD;  Location: [facility];  Service: Endoscopy;  Laterality: N/A;  1030 DR REQUEST TIME  . KNEE ARTHROSCOPY Right    torn meniscus  . ORIF TOE FRACTURE Right   . rotator Cuff surgery    . TUBAL LIGATION       Medications:  Outpatient Encounter Medications as of 03/04/2019  Medication Sig  . [DISCONTINUED] ALPRAZolam (XANAX) 0.25 MG tablet Take  1-2 tablets (0.25-0.5 mg total) by mouth 3 (three) times daily as needed for anxiety.  . [DISCONTINUED] CALCIUM-VITAMIN D PO Take by mouth.  . [DISCONTINUED] Cyanocobalamin (VITAMIN B-12 PO) Take by mouth.  . [DISCONTINUED] dexlansoprazole (DEXILANT) 60 MG capsule Take 1 capsule (60 mg total) by mouth daily.  . [DISCONTINUED] gabapentin (NEURONTIN) 100 MG capsule Take 1 capsule (100 mg total) by mouth at bedtime.  . [DISCONTINUED] Multiple Vitamins-Minerals (CENTRUM ULTRA WOMENS  PO) Take by mouth.  . [DISCONTINUED] PREMARIN vaginal cream INSERT 1/2 GRAM VAGINALLY 2 TIMES A WEEK   No facility-administered encounter medications on file as of 03/04/2019.     Allergies:  Allergies  Allergen Reactions  . Codeine     nausea  . Penicillins Nausea And Vomiting  . Tramadol     Family History: Family History  Problem Relation Age of Onset  . Bipolar disorder Sister   . Schizophrenia Sister   . Kidney disease Sister   . Hypertension Sister   . Bipolar disorder Sister   . Kidney disease Sister   . Hypertension Mother   . Mental illness Mother   . Dementia Mother   . Cancer Father 23       colon; bladder  . Hypertension Father   . Heart disease Father   . Arthritis Father   . Bipolar disorder Son     Social History: Social History   Tobacco Use  . Smoking status: Former Smoker    Packs/day: 1.00    Years: 10.00    Pack years: 10.00    Types: Cigarettes    Start date: 04/13/1971    Last attempt to quit: 11/01/1993    Years since quitting: 25.3  . Smokeless tobacco: Never Used  Substance Use Topics  . Alcohol use: Yes    Alcohol/week: 0.0 standard drinks    Comment: wine  . Drug use: No   Social History   Social History Narrative   Exercising regularly - senior exercises, walking   Has 2 children.    Review of Systems:  CONSTITUTIONAL: No fevers, chills, night sweats, or weight loss.   EYES: No visual changes or eye pain ENT: No hearing changes.  No history of nose bleeds.   RESPIRATORY: No cough, wheezing and shortness of breath.   CARDIOVASCULAR: Negative for chest pain, and palpitations.   GI: Negative for abdominal discomfort, blood in stools or black stools.  No recent change in bowel habits.   GU:  No history of incontinence.   MUSCLOSKELETAL: No history of joint pain or swelling.  No myalgias.   SKIN: Negative for lesions, rash, and itching.   HEMATOLOGY/ONCOLOGY: Negative for prolonged bleeding, bruising easily, and swollen nodes.   No history of cancer.   ENDOCRINE: Negative for cold or heat intolerance, polydipsia or goiter.   PSYCH:  No depression or anxiety symptoms.   NEURO: As Above.   Vital Signs:  Ht 5\' 5"  (1.651 m)   Wt 171 lb (77.6 kg)   BMI 28.46 kg/m    General Medical Exam:  Well appearing, comfortable.  Nonlabored breathing.   Neurological Exam: MENTAL STATUS including orientation to time, place, person, recent and remote memory, attention span and concentration, language, and fund of knowledge is normal.  Speech is not dysarthric.  CRANIAL NERVES:  Normal conjugate, extra-ocular eye movements in all directions of gaze.  No ptosis.  Normal facial symmetry and movements.  Normal shoulder shrug and head rotation.  Tongue is midline.  MOTOR:  Antigravity in  all extremities.  No abnormal movements.  No pronator drift.  SENSORY: Negative Prayer and Phalen's test bilaterally.   COORDINATION/GAIT: Normal finger to nose bilaterally.  Intact rapid alternating movements bilaterally.  Able to rise from a chair without using arms.  Gait narrow based and stable.   IMPRESSION/PLAN: Left hand paresthesias, most suggestive of carpal tunnel syndrome.   - Start using wrist splint at all times  - NCS/EMG of the left hand  - She does not wish to takes anything to ease tingling as it is not bothersome enough.  She did not have adequate trial of gabapentin, but did not wish to take it due to potential side effects.  She is welcome to use OTC lidocaine ointment to hand as needed, if she develops painful paresthesias.     Follow Up Instructions:  I discussed the assessment and treatment plan with the patient. The patient was provided an opportunity to ask questions and all were answered. The patient agreed with the plan and demonstrated an understanding of the instructions.   The patient was advised to call back or seek an in-person evaluation if the symptoms worsen or if the condition fails to improve as anticipated.   Return to clinic after testing   Alda Berthold, DO

## 2019-03-04 ENCOUNTER — Encounter: Payer: Self-pay | Admitting: Neurology

## 2019-03-04 ENCOUNTER — Encounter: Payer: Self-pay | Admitting: *Deleted

## 2019-03-04 ENCOUNTER — Telehealth (INDEPENDENT_AMBULATORY_CARE_PROVIDER_SITE_OTHER): Payer: Federal, State, Local not specified - PPO | Admitting: Neurology

## 2019-03-04 ENCOUNTER — Other Ambulatory Visit: Payer: Self-pay

## 2019-03-04 ENCOUNTER — Other Ambulatory Visit: Payer: Self-pay | Admitting: *Deleted

## 2019-03-04 VITALS — Ht 65.0 in | Wt 171.0 lb

## 2019-03-04 DIAGNOSIS — G5602 Carpal tunnel syndrome, left upper limb: Secondary | ICD-10-CM

## 2019-03-04 NOTE — Progress Notes (Signed)
Order placed

## 2019-03-26 ENCOUNTER — Telehealth: Payer: Self-pay | Admitting: Neurology

## 2019-03-26 NOTE — Telephone Encounter (Signed)
Patient called and wanted to let Dr. Posey Pronto know that she is doing much better and does not need the referral or appointment for a NCS. She said it seemed to have gotten better by itself.  Thanks

## 2019-03-26 NOTE — Telephone Encounter (Signed)
Noted  

## 2019-05-21 NOTE — Progress Notes (Signed)
Subjective:    Patient ID: Lori [name], female    DOB: [birth year], 68 y.o.   MRN: [id number]  HPI She is here for a physical exam.   Right lateral knee pain:  She was walking a lot and is not able to walk as much.  She is doing yard work.  She has pain on the lateral aspect and it swells.  It hurts going up stairs.  She ices and take tylenol.  Years ago she had a torn meniscus and had surgery in 2012.     Medications and allergies reviewed with patient and updated if appropriate.  Patient Active Problem List   Diagnosis Date Noted  . Stress at home 11/14/2018  . Hyperglycemia 05/08/2018  . Vertigo 04/17/2018  . Pyelonephritis 05/02/2017  . Hyperlipidemia 04/27/2017  . Personal history of colonic polyps 06/06/2014  . Family history of colon cancer 06/06/2014  . Osteopenia 11/04/2013    No current outpatient medications on file prior to visit.   No current facility-administered medications on file prior to visit.     Past Medical History:  Diagnosis Date  . CTS (carpal tunnel syndrome)    chronic  . Depression 01/16/2013  . Osteopenia   . PONV (postoperative nausea and vomiting)   . Tubular adenoma   . UTI (urinary tract infection)     Past Surgical History:  Procedure Laterality Date  . ABDOMINAL HYSTERECTOMY  1994   fibroids  . APPENDECTOMY    . BUNIONECTOMY Right   . BUNIONECTOMY Left 2018  . CHOLECYSTECTOMY    . COLONOSCOPY  12/09/2008   Dr.Rourk- normal rectum, polyp at the hepatic flexure bx= tubular adenoma. the remainder of the colonic mucosa and terminal ileum mucosa appeared normal.  . COLONOSCOPY  2006   Dr.Sam [place]- colonic adenoma  . COLONOSCOPY N/A 06/25/2014   Procedure: COLONOSCOPY;  Surgeon: Daneil Dolin, MD;  Location: [facility];  Service: Endoscopy;  Laterality: N/A;  1030 DR REQUEST TIME  . KNEE ARTHROSCOPY Right    torn meniscus  . ORIF TOE FRACTURE Right   . rotator Cuff surgery    . TUBAL LIGATION      Social History    Socioeconomic History  . Marital status: Widowed    Spouse name: Not on file  . Number of children: 2  . Years of education: Not on file  . Highest education level: Not on file  Occupational History  . Not on file  Social Needs  . Financial resource strain: Not on file  . Food insecurity    Worry: Not on file    Inability: Not on file  . Transportation needs    Medical: Not on file    Non-medical: Not on file  Tobacco Use  . Smoking status: Former Smoker    Packs/day: 1.00    Years: 10.00    Pack years: 10.00    Types: Cigarettes    Start date: 04/13/1971    Quit date: 11/01/1993    Years since quitting: 25.5  . Smokeless tobacco: Never Used  Substance and Sexual Activity  . Alcohol use: Yes    Alcohol/week: 0.0 standard drinks    Comment: wine  . Drug use: No  . Sexual activity: Yes    Birth control/protection: Surgical  Lifestyle  . Physical activity    Days per week: Not on file    Minutes per session: Not on file  . Stress: Not on file  Relationships  . Social  connections    Talks on phone: Not on file    Gets together: Not on file    Attends religious service: Not on file    Active member of club or organization: Not on file    Attends meetings of clubs or organizations: Not on file    Relationship status: Not on file  Other Topics Concern  . Not on file  Social History Narrative   Exercising regularly - senior exercises, walking   Has 2 children.    Family History  Problem Relation Age of Onset  . Bipolar disorder Sister   . Schizophrenia Sister   . Kidney disease Sister   . Hypertension Sister   . Bipolar disorder Sister   . Kidney disease Sister   . Hypertension Mother   . Mental illness Mother   . Dementia Mother   . Cancer Father 32       colon; bladder  . Hypertension Father   . Heart disease Father   . Arthritis Father   . Bipolar disorder Son     Review of Systems  Constitutional: Negative for chills and fever.  Eyes: Negative for  visual disturbance.  Respiratory: Negative for cough, shortness of breath and wheezing.   Cardiovascular: Negative for chest pain, palpitations and leg swelling.  Gastrointestinal: Negative for abdominal pain, blood in stool, constipation, diarrhea and nausea.       No gerd  Genitourinary: Negative for dysuria and hematuria.  Musculoskeletal: Positive for arthralgias (right knee pain).  Skin: Negative for color change and rash.  Neurological: Negative for light-headedness, numbness and headaches.  Psychiatric/Behavioral: Negative for dysphoric mood. The patient is not nervous/anxious.        Objective:   Vitals:   05/22/19 0810  BP: 132/78  Pulse: 68  Resp: 16  Temp: 98.3 F (36.8 C)  SpO2: 98%   Filed Weights   05/22/19 0810  Weight: 173 lb 12.8 oz (78.8 kg)   Body mass index is 28.92 kg/m.  BP Readings from Last 3 Encounters:  05/22/19 132/78  11/26/18 (!) 148/78  11/22/18 (!) 148/88    Wt Readings from Last 3 Encounters:  05/22/19 173 lb 12.8 oz (78.8 kg)  03/04/19 171 lb (77.6 kg)  11/26/18 166 lb (75.3 kg)     Physical Exam Constitutional: She appears well-developed and well-nourished. No distress.  HENT:  Head: Normocephalic and atraumatic.  Right Ear: External ear normal. Normal ear canal and TM Left Ear: External ear normal.  Normal ear canal and TM Mouth/Throat: Oropharynx is clear and moist.  Eyes: Conjunctivae and EOM are normal.  Neck: Neck supple. No tracheal deviation present. No thyromegaly present.  No carotid bruit  Cardiovascular: Normal rate, regular rhythm and normal heart sounds.   No murmur heard.  No edema. Pulmonary/Chest: Effort normal and breath sounds normal. No respiratory distress. She has no wheezes. She has no rales.  Breast: deferred   Abdominal: Soft. She exhibits no distension. There is no tenderness.  Lymphadenopathy: She has no cervical adenopathy.  Skin: Skin is warm and dry. She is not diaphoretic.  Psychiatric: She has  a normal mood and affect. Her behavior is normal.        Assessment & Plan:   Physical exam: Screening blood work ordered Immunizations  prevnar deferred Colonoscopy   Up to date but due 06/2019  Mammogram   Up to date  Gyn  Up to date  Dexa    Due - elam - deferred until next year  Eye exams  Up to date  Exercise  Yard work, some walking - 30 min at most due to knee pain Weight  BMI good for age Skin  No concerns Substance abuse   none  See Problem List for Assessment and Plan of chronic medical problems.   FU in one year

## 2019-05-21 NOTE — Patient Instructions (Addendum)
Tests ordered today. Your results will be released to [place] (or called to you) after review.  If any changes need to be made, you will be notified at that same time.  All other Health Maintenance issues reviewed.   All recommended immunizations and age-appropriate screenings are up-to-date or discussed.  No immunization administered today.   Medications reviewed and updated.  Changes include :   none   Please followup in 1 year    Health Maintenance, Female Adopting a healthy lifestyle and getting preventive care are important in promoting health and wellness. Ask your health care provider about:  The right schedule for you to have regular tests and exams.  Things you can do on your own to prevent diseases and keep yourself healthy. What should I know about diet, weight, and exercise? Eat a healthy diet   Eat a diet that includes plenty of vegetables, fruits, low-fat dairy products, and lean protein.  Do not eat a lot of foods that are high in solid fats, added sugars, or sodium. Maintain a healthy weight Body mass index (BMI) is used to identify weight problems. It estimates body fat based on height and weight. Your health care provider can help determine your BMI and help you achieve or maintain a healthy weight. Get regular exercise Get regular exercise. This is one of the most important things you can do for your health. Most adults should:  Exercise for at least 150 minutes each week. The exercise should increase your heart rate and make you sweat (moderate-intensity exercise).  Do strengthening exercises at least twice a week. This is in addition to the moderate-intensity exercise.  Spend less time sitting. Even light physical activity can be beneficial. Watch cholesterol and blood lipids Have your blood tested for lipids and cholesterol at 66 years of age, then have this test every 5 years. Have your cholesterol levels checked more often if:  Your lipid or  cholesterol levels are high.  You are older than 66 years of age.  You are at high risk for heart disease. What should I know about cancer screening? Depending on your health history and family history, you may need to have cancer screening at various ages. This may include screening for:  Breast cancer.  Cervical cancer.  Colorectal cancer.  Skin cancer.  Lung cancer. What should I know about heart disease, diabetes, and high blood pressure? Blood pressure and heart disease  High blood pressure causes heart disease and increases the risk of stroke. This is more likely to develop in people who have high blood pressure readings, are of African descent, or are overweight.  Have your blood pressure checked: ? Every 3-5 years if you are 34-69 years of age. ? Every year if you are 75 years old or older. Diabetes Have regular diabetes screenings. This checks your fasting blood sugar level. Have the screening done:  Once every three years after age 91 if you are at a normal weight and have a low risk for diabetes.  More often and at a younger age if you are overweight or have a high risk for diabetes. What should I know about preventing infection? Hepatitis B If you have a higher risk for hepatitis B, you should be screened for this virus. Talk with your health care provider to find out if you are at risk for hepatitis B infection. Hepatitis C Testing is recommended for:  Everyone born from [birth year] through 1963.  Anyone with known risk factors for hepatitis C. Sexually  transmitted infections (STIs)  Get screened for STIs, including gonorrhea and chlamydia, if: ? You are sexually active and are younger than 66 years of age. ? You are older than 66 years of age and your health care provider tells you that you are at risk for this type of infection. ? Your sexual activity has changed since you were last screened, and you are at increased risk for chlamydia or gonorrhea. Ask your  health care provider if you are at risk.  Ask your health care provider about whether you are at high risk for HIV. Your health care provider may recommend a prescription medicine to help prevent HIV infection. If you choose to take medicine to prevent HIV, you should first get tested for HIV. You should then be tested every 3 months for as long as you are taking the medicine. Pregnancy  If you are about to stop having your period (premenopausal) and you may become pregnant, seek counseling before you get pregnant.  Take 400 to 800 micrograms (mcg) of folic acid every day if you become pregnant.  Ask for birth control (contraception) if you want to prevent pregnancy. Osteoporosis and menopause Osteoporosis is a disease in which the bones lose minerals and strength with aging. This can result in bone fractures. If you are 37 years old or older, or if you are at risk for osteoporosis and fractures, ask your health care provider if you should:  Be screened for bone loss.  Take a calcium or vitamin D supplement to lower your risk of fractures.  Be given hormone replacement therapy (HRT) to treat symptoms of menopause. Follow these instructions at home: Lifestyle  Do not use any products that contain nicotine or tobacco, such as cigarettes, e-cigarettes, and chewing tobacco. If you need help quitting, ask your health care provider.  Do not use street drugs.  Do not share needles.  Ask your health care provider for help if you need support or information about quitting drugs. Alcohol use  Do not drink alcohol if: ? Your health care provider tells you not to drink. ? You are pregnant, may be pregnant, or are planning to become pregnant.  If you drink alcohol: ? Limit how much you use to 0-1 drink a day. ? Limit intake if you are breastfeeding.  Be aware of how much alcohol is in your drink. In the [place] one drink equals one 12 oz bottle of beer (355 mL), one 5 oz glass of wine (148  mL), or one 1 oz glass of hard liquor (44 mL). General instructions  Schedule regular health, dental, and eye exams.  Stay current with your vaccines.  Tell your health care provider if: ? You often feel depressed. ? You have ever been abused or do not feel safe at home. Summary  Adopting a healthy lifestyle and getting preventive care are important in promoting health and wellness.  Follow your health care provider's instructions about healthy diet, exercising, and getting tested or screened for diseases.  Follow your health care provider's instructions on monitoring your cholesterol and blood pressure. This information is not intended to replace advice given to you by your health care provider. Make sure you discuss any questions you have with your health care provider. Document Released: 04/25/2011 Document Revised: 10/03/2018 Document Reviewed: 10/03/2018 Elsevier Patient Education  2020 [employer].

## 2019-05-22 ENCOUNTER — Other Ambulatory Visit (INDEPENDENT_AMBULATORY_CARE_PROVIDER_SITE_OTHER): Payer: Federal, State, Local not specified - PPO

## 2019-05-22 ENCOUNTER — Ambulatory Visit (INDEPENDENT_AMBULATORY_CARE_PROVIDER_SITE_OTHER): Payer: Federal, State, Local not specified - PPO | Admitting: Internal Medicine

## 2019-05-22 ENCOUNTER — Encounter: Payer: Self-pay | Admitting: Internal Medicine

## 2019-05-22 ENCOUNTER — Other Ambulatory Visit: Payer: Self-pay

## 2019-05-22 VITALS — BP 132/78 | HR 68 | Temp 98.3°F | Resp 16 | Ht 65.0 in | Wt 173.8 lb

## 2019-05-22 DIAGNOSIS — M85859 Other specified disorders of bone density and structure, unspecified thigh: Secondary | ICD-10-CM | POA: Diagnosis not present

## 2019-05-22 DIAGNOSIS — Z0000 Encounter for general adult medical examination without abnormal findings: Secondary | ICD-10-CM

## 2019-05-22 DIAGNOSIS — R739 Hyperglycemia, unspecified: Secondary | ICD-10-CM

## 2019-05-22 DIAGNOSIS — M25561 Pain in right knee: Secondary | ICD-10-CM

## 2019-05-22 DIAGNOSIS — G8929 Other chronic pain: Secondary | ICD-10-CM

## 2019-05-22 DIAGNOSIS — E782 Mixed hyperlipidemia: Secondary | ICD-10-CM

## 2019-05-22 HISTORY — DX: Other chronic pain: G89.29

## 2019-05-22 LAB — CBC WITH DIFFERENTIAL/PLATELET
Basophils Absolute: 0 10*3/uL (ref 0.0–0.1)
Basophils Relative: 1.3 % (ref 0.0–3.0)
Eosinophils Absolute: 0.2 10*3/uL (ref 0.0–0.7)
Eosinophils Relative: 5.3 % — ABNORMAL HIGH (ref 0.0–5.0)
HCT: 43.2 % (ref 36.0–46.0)
Hemoglobin: 14.8 g/dL (ref 12.0–15.0)
Lymphocytes Relative: 40.3 % (ref 12.0–46.0)
Lymphs Abs: 1.5 10*3/uL (ref 0.7–4.0)
MCHC: 34.1 g/dL (ref 30.0–36.0)
MCV: 90.6 fl (ref 78.0–100.0)
Monocytes Absolute: 0.4 10*3/uL (ref 0.1–1.0)
Monocytes Relative: 9.6 % (ref 3.0–12.0)
Neutro Abs: 1.6 10*3/uL (ref 1.4–7.7)
Neutrophils Relative %: 43.5 % (ref 43.0–77.0)
Platelets: 267 10*3/uL (ref 150.0–400.0)
RBC: 4.77 Mil/uL (ref 3.87–5.11)
RDW: 13.8 % (ref 11.5–15.5)
WBC: 3.6 10*3/uL — ABNORMAL LOW (ref 4.0–10.5)

## 2019-05-22 LAB — COMPREHENSIVE METABOLIC PANEL
ALT: 16 U/L (ref 0–35)
AST: 19 U/L (ref 0–37)
Albumin: 4.8 g/dL (ref 3.5–5.2)
Alkaline Phosphatase: 69 U/L (ref 39–117)
BUN: 16 mg/dL (ref 6–23)
CO2: 30 mEq/L (ref 19–32)
Calcium: 9.6 mg/dL (ref 8.4–10.5)
Chloride: 103 mEq/L (ref 96–112)
Creatinine, Ser: 0.89 mg/dL (ref 0.40–1.20)
GFR: 76.76 mL/min (ref >60.00)
Glucose, Bld: 107 mg/dL — ABNORMAL HIGH (ref 70–99)
Potassium: 4.2 mEq/L (ref 3.5–5.1)
Sodium: 140 mEq/L (ref 135–145)
Total Bilirubin: 0.9 mg/dL (ref 0.2–1.2)
Total Protein: 7.3 g/dL (ref 6.0–8.3)

## 2019-05-22 LAB — LIPID PANEL
Cholesterol: 262 mg/dL — ABNORMAL HIGH (ref 0–200)
HDL: 85.3 mg/dL (ref >39.00)
LDL Cholesterol: 158 mg/dL — ABNORMAL HIGH (ref 0–99)
NonHDL: 176.95
Total CHOL/HDL Ratio: 3
Triglycerides: 96 mg/dL (ref 0.0–149.0)
VLDL: 19.2 mg/dL (ref 0.0–40.0)

## 2019-05-22 LAB — TSH: TSH: 1.38 u[IU]/mL (ref 0.35–4.50)

## 2019-05-22 LAB — VITAMIN D 25 HYDROXY (VIT D DEFICIENCY, FRACTURES): VITD: 37.35 ng/mL (ref 30.00–100.00)

## 2019-05-22 LAB — HEMOGLOBIN A1C: Hgb A1c MFr Bld: 5.5 % (ref 4.6–6.5)

## 2019-05-22 MED ORDER — CALCIUM CARBONATE-VITAMIN D 600-400 MG-UNIT PO TABS: 1.0000 | ORAL_TABLET | Freq: Every day | ORAL | Status: AC

## 2019-05-22 MED ORDER — B COMPLEX VITAMINS PO CAPS: 1.0000 | ORAL_CAPSULE | Freq: Every day | ORAL | Status: AC

## 2019-05-22 NOTE — Assessment & Plan Note (Signed)
a1c

## 2019-05-22 NOTE — Assessment & Plan Note (Signed)
Right lateral knee pain and swelling Limiting her activity Advised ortho eval, but she would like to wait for now - she will let me know if/when she wants referral Continue ice, tylenol

## 2019-05-22 NOTE — Assessment & Plan Note (Signed)
Taking calcium and vitamin d Active, but limited by knee pain Deferred dexa for now

## 2019-05-22 NOTE — Assessment & Plan Note (Signed)
Check lipids, cmp, tsh Exercising, eating healthy

## 2019-06-28 ENCOUNTER — Ambulatory Visit: Payer: Self-pay | Admitting: *Deleted

## 2019-06-28 ENCOUNTER — Encounter: Payer: Self-pay | Admitting: Internal Medicine

## 2019-06-28 ENCOUNTER — Ambulatory Visit (INDEPENDENT_AMBULATORY_CARE_PROVIDER_SITE_OTHER): Payer: Federal, State, Local not specified - PPO | Admitting: Internal Medicine

## 2019-06-28 ENCOUNTER — Other Ambulatory Visit: Payer: Self-pay

## 2019-06-28 DIAGNOSIS — R6889 Other general symptoms and signs: Secondary | ICD-10-CM | POA: Diagnosis not present

## 2019-06-28 DIAGNOSIS — R519 Headache, unspecified: Secondary | ICD-10-CM

## 2019-06-28 DIAGNOSIS — G4452 New daily persistent headache (NDPH): Secondary | ICD-10-CM | POA: Diagnosis not present

## 2019-06-28 DIAGNOSIS — Z20822 Contact with and (suspected) exposure to covid-19: Secondary | ICD-10-CM

## 2019-06-28 HISTORY — DX: Headache, unspecified: R51.9

## 2019-06-28 MED ORDER — SUMATRIPTAN SUCCINATE 50 MG PO TABS: 50.0000 mg | ORAL_TABLET | ORAL | 0 refills | Status: DC | PRN

## 2019-06-28 NOTE — Telephone Encounter (Signed)
Pt states she has had a nagging headache for 3 days. Pt states she keeps thinking it will go away. She is calling to ask if anything else she can do, as I advised her they probably would not see you in office with this sx. Pt states she does not usually get headache. Patient has taken Advil, baby aspirin every other day.  Reason for Disposition . [1] MODERATE headache (e.g., interferes with normal activities) AND [2] present > 24 hours AND [3] unexplained  (Exceptions: analgesics not tried, typical migraine, or headache part of viral illness)  Answer Assessment - Initial Assessment Questions 1. LOCATION: "Where does it hurt?"      Top of head 2. ONSET: "When did the headache start?" (Minutes, hours or days)      Started Wednesday 3. PATTERN: "Does the pain come and go, or has it been constant since it started?"     constant 4. SEVERITY: "How bad is the pain?" and "What does it keep you from doing?"  (e.g., Scale 1-10; mild, moderate, or severe)   - MILD (1-3): doesn't interfere with normal activities    - MODERATE (4-7): interferes with normal activities or awakens from sleep    - SEVERE (8-10): excruciating pain, unable to do any normal activities        moderate 5. RECURRENT SYMPTOM: "Have you ever had headaches before?" If so, ask: "When was the last time?" and "What happened that time?"      no 6. CAUSE: "What do you think is causing the headache?"     unknown 7. MIGRAINE: "Have you been diagnosed with migraine headaches?" If so, ask: "Is this headache similar?"      no 8. HEAD INJURY: "Has there been any recent injury to the head?"      no 9. OTHER SYMPTOMS: "Do you have any other symptoms?" (fever, stiff neck, eye pain, sore throat, cold symptoms)     no 10. PREGNANCY: "Is there any chance you are pregnant?" "When was your last menstrual period?"       n/a  Protocols used: HEADACHE-A-AH

## 2019-06-28 NOTE — Assessment & Plan Note (Signed)
No trauma to suggest bleeding. No temporal tenderness to suggest temporal arteritis. Checking covid-19 testing to rule out. If negative can check CT head if no improvement. Rx imitrex to try to break headache cycle.

## 2019-06-28 NOTE — Progress Notes (Signed)
Virtual Visit via Audio Note  I connected with CORAMAE KELLENBERGER on 06/28/19 at 11:40 AM EDT by an audio-only enabled telemedicine application and verified that I am speaking with the correct person using two identifiers.  The patient and the provider were at separate locations throughout the entire encounter.   I discussed the limitations of evaluation and management by telemedicine and the availability of in person appointments. The patient expressed understanding and agreed to proceed.  History of Present Illness: The patient is a 68 y.o. female with visit for headaches. Started having this on Wednesday. Has no change in vision. Denies numbness or weakness. Overall it is not improving. Has tried taking advil and this helps temporarily but it always comes back. Denies sinus pain or drainage. Denies cough or SOB. Denies fevers or chills. Denies known exposure to covid-19. Never usually has headaches. Has no significant medical problems. Denies prior high blood pressure.   Observations/Objective: Voice strong, no coughing or dyspnea during visit, A and O times 3 BP 158/90 at [employer], BP 151/89 at home  Assessment and Plan: See problem oriented charting  Follow Up Instructions: covid-19 testing, imitrex rx  Visit time 14 minutes: that time was spent in non-face to face counseling and coordination of care with the patient: counseled about as above  I discussed the assessment and treatment plan with the patient. The patient was provided an opportunity to ask questions and all were answered. The patient agreed with the plan and demonstrated an understanding of the instructions.   The patient was advised to call back or seek an in-person evaluation if the symptoms worsen or if the condition fails to improve as anticipated.  [name], MD

## 2019-06-28 NOTE — Telephone Encounter (Signed)
On schedule today with Dr. Sharlet Salina

## 2019-06-30 LAB — NOVEL CORONAVIRUS, NAA: SARS-CoV-2, NAA: NOT DETECTED

## 2019-07-02 NOTE — Progress Notes (Signed)
Subjective:    Patient ID: Lori [name], female    DOB: 06-16-1951, 68 y.o.   MRN: [id number]  HPI The patient is here for an acute visit.   Left wrist pain and left shoulder pain: this started about 2-3 weeks ago.  She is unsure if they are related.  The wrist pain is on the anterior aspect and comes and goes. It is not related to movement or activity - it is an intermittent sharp pain.  The shoulder pain is also intermittent - it also occurs randomly, intermittently and is a sharp pain.  It is not related to movement or activity.  She denies neck pain.     Right knee pain:  She has swelling and pain on lateral side of the knee and lower leg.  This has been going on for a while and has not improved.  She denies numbness, but maybe a tingling.  It hurts more with walking.  There was no injury for fall.  She did have arthroscopic knee surgery in the past for meniscal tear.   Medications and allergies reviewed with patient and updated if appropriate.  Patient Active Problem List   Diagnosis Date Noted  . Left wrist pain 07/03/2019  . Acute pain of left shoulder 07/03/2019  . Headache 06/28/2019  . Chronic pain of right knee 05/22/2019  . Stress at home 11/14/2018  . Hyperglycemia 05/08/2018  . Vertigo 04/17/2018  . Pyelonephritis 05/02/2017  . Hyperlipidemia 04/27/2017  . Personal history of colonic polyps 06/06/2014  . Family history of colon cancer 06/06/2014  . Osteopenia 11/04/2013    Current Outpatient Medications on File Prior to Visit  Medication Sig Dispense Refill  . b complex vitamins capsule Take 1 capsule by mouth daily.    . Calcium Carbonate-Vitamin D 600-400 MG-UNIT tablet Take 1 tablet by mouth daily.    . SUMAtriptan (IMITREX) 50 MG tablet Take 1 tablet (50 mg total) by mouth every 2 (two) hours as needed for migraine. May repeat in 2 hours if headache persists or recurs. 10 tablet 0   No current facility-administered medications on file prior to visit.     Past Medical History:  Diagnosis Date  . CTS (carpal tunnel syndrome)    chronic  . Depression 01/16/2013  . Osteopenia   . PONV (postoperative nausea and vomiting)   . Tubular adenoma   . UTI (urinary tract infection)     Past Surgical History:  Procedure Laterality Date  . ABDOMINAL HYSTERECTOMY  1994   fibroids  . APPENDECTOMY    . BUNIONECTOMY Right   . BUNIONECTOMY Left 2018  . CHOLECYSTECTOMY    . COLONOSCOPY  12/09/2008   Dr.Rourk- normal rectum, polyp at the hepatic flexure bx= tubular adenoma. the remainder of the colonic mucosa and terminal ileum mucosa appeared normal.  . COLONOSCOPY  2006   Dr.Sam Roswell- colonic adenoma  . COLONOSCOPY N/A 06/25/2014   Procedure: COLONOSCOPY;  Surgeon: Daneil Dolin, MD;  Location: [facility];  Service: Endoscopy;  Laterality: N/A;  1030 DR REQUEST TIME  . KNEE ARTHROSCOPY Right    torn meniscus  . ORIF TOE FRACTURE Right   . rotator Cuff surgery    . TUBAL LIGATION      Social History   Socioeconomic History  . Marital status: Widowed    Spouse name: Not on file  . Number of children: 2  . Years of education: Not on file  . Highest education level: Not on  file  Occupational History  . Not on file  Social Needs  . Financial resource strain: Not on file  . Food insecurity    Worry: Not on file    Inability: Not on file  . Transportation needs    Medical: Not on file    Non-medical: Not on file  Tobacco Use  . Smoking status: Former Smoker    Packs/day: 1.00    Years: 10.00    Pack years: 10.00    Types: Cigarettes    Start date: 04/13/1971    Quit date: 11/01/1993    Years since quitting: 25.6  . Smokeless tobacco: Never Used  Substance and Sexual Activity  . Alcohol use: Yes    Alcohol/week: 0.0 standard drinks    Comment: wine  . Drug use: No  . Sexual activity: Yes    Birth control/protection: Surgical  Lifestyle  . Physical activity    Days per week: Not on file    Minutes per session: Not on file   . Stress: Not on file  Relationships  . Social Herbalist on phone: Not on file    Gets together: Not on file    Attends religious service: Not on file    Active member of club or organization: Not on file    Attends meetings of clubs or organizations: Not on file    Relationship status: Not on file  Other Topics Concern  . Not on file  Social History Narrative   Exercising regularly - senior exercises, walking   Has 2 children.    Family History  Problem Relation Age of Onset  . Bipolar disorder Sister   . Schizophrenia Sister   . Kidney disease Sister   . Hypertension Sister   . Bipolar disorder Sister   . Kidney disease Sister   . Hypertension Mother   . Mental illness Mother   . Dementia Mother   . Cancer Father 68       colon; bladder  . Hypertension Father   . Heart disease Father   . Arthritis Father   . Bipolar disorder Son     Review of Systems Per HPI    Objective:   Vitals:   07/03/19 0933  BP: (!) 142/72  Pulse: 79  Resp: 16  Temp: 99.1 F (37.3 C)  SpO2: 98%   BP Readings from Last 3 Encounters:  07/03/19 (!) 142/72  05/22/19 132/78  11/26/18 (!) 148/78   Wt Readings from Last 3 Encounters:  07/03/19 171 lb (77.6 kg)  05/22/19 173 lb 12.8 oz (78.8 kg)  03/04/19 171 lb (77.6 kg)   Body mass index is 28.46 kg/m.   Physical Exam Constitutional:      General: She is not in acute distress.    Appearance: Normal appearance. She is not ill-appearing.  HENT:     Head: Normocephalic and atraumatic.  Musculoskeletal:     Comments: No neck tenderness.  No tenderness with left shoulder or left wrist.  Full range of motion with both without pain.  No swelling in either joint.  Right knee with mild swelling on lateral lower aspect of anterior knee, mild to minimal tenderness with palpation.  Full range of motion of knee.  Mild tenderness posterior knee.  No effusion  Skin:    General: Skin is warm and dry.     Findings: No erythema.   Neurological:     Mental Status: She is alert.     Sensory: No  sensory deficit.     Motor: No weakness.            Assessment & Plan:    See Problem List for Assessment and Plan of chronic medical problems.

## 2019-07-03 ENCOUNTER — Ambulatory Visit (INDEPENDENT_AMBULATORY_CARE_PROVIDER_SITE_OTHER): Payer: Federal, State, Local not specified - PPO | Admitting: Internal Medicine

## 2019-07-03 ENCOUNTER — Other Ambulatory Visit: Payer: Self-pay

## 2019-07-03 ENCOUNTER — Encounter: Payer: Self-pay | Admitting: Internal Medicine

## 2019-07-03 ENCOUNTER — Ambulatory Visit (INDEPENDENT_AMBULATORY_CARE_PROVIDER_SITE_OTHER)
Admission: RE | Admit: 2019-07-03 | Discharge: 2019-07-03 | Disposition: A | Discharge status: Home / Self Care | Payer: Federal, State, Local not specified - PPO | Source: Ambulatory Visit | Attending: Internal Medicine | Admitting: Internal Medicine

## 2019-07-03 VITALS — BP 142/72 | HR 79 | Temp 99.1°F | Resp 16 | Ht 65.0 in | Wt 171.0 lb

## 2019-07-03 DIAGNOSIS — Z23 Encounter for immunization: Secondary | ICD-10-CM | POA: Diagnosis not present

## 2019-07-03 DIAGNOSIS — M25511 Pain in right shoulder: Secondary | ICD-10-CM | POA: Insufficient documentation

## 2019-07-03 DIAGNOSIS — M25561 Pain in right knee: Secondary | ICD-10-CM

## 2019-07-03 DIAGNOSIS — G8929 Other chronic pain: Secondary | ICD-10-CM

## 2019-07-03 DIAGNOSIS — M25532 Pain in left wrist: Secondary | ICD-10-CM

## 2019-07-03 DIAGNOSIS — M25512 Pain in left shoulder: Secondary | ICD-10-CM

## 2019-07-03 HISTORY — DX: Pain in left shoulder: M25.512

## 2019-07-03 HISTORY — DX: Pain in left wrist: M25.532

## 2019-07-03 NOTE — Assessment & Plan Note (Signed)
Right lateral knee pain and swelling that has been going on for a while Possible arthritis versus meniscal/ligament tear X-ray today Will refer to Dr. Tamala Julian for further evaluation and treatment

## 2019-07-03 NOTE — Assessment & Plan Note (Signed)
Intermittent pain not related to activity or movement Sharp pain Currently no pain and has full range of motion She will monitor Will refer to sports medicine

## 2019-07-03 NOTE — Patient Instructions (Addendum)
Have an xray of right knee today.    Set up an appointment with Dr Tamala Julian.    Flu immunization administered today.

## 2019-07-22 ENCOUNTER — Telehealth: Payer: Self-pay

## 2019-07-22 NOTE — Telephone Encounter (Signed)
Called pt and she scheduled a nurse visit on 08/15/2019.

## 2019-07-22 NOTE — Telephone Encounter (Signed)
Pt received letter needing nurse visit for TCS. Please call pt at [phone].

## 2019-07-24 ENCOUNTER — Ambulatory Visit (INDEPENDENT_AMBULATORY_CARE_PROVIDER_SITE_OTHER): Payer: Federal, State, Local not specified - PPO | Admitting: Family Medicine

## 2019-07-24 ENCOUNTER — Other Ambulatory Visit: Payer: Self-pay

## 2019-07-24 ENCOUNTER — Encounter: Payer: Self-pay | Admitting: Family Medicine

## 2019-07-24 ENCOUNTER — Ambulatory Visit (INDEPENDENT_AMBULATORY_CARE_PROVIDER_SITE_OTHER)
Admission: RE | Admit: 2019-07-24 | Discharge: 2019-07-24 | Disposition: A | Discharge status: Home / Self Care | Payer: Federal, State, Local not specified - PPO | Source: Ambulatory Visit | Attending: Family Medicine | Admitting: Family Medicine

## 2019-07-24 VITALS — BP 122/70 | HR 95 | Ht 65.0 in | Wt 171.0 lb

## 2019-07-24 DIAGNOSIS — M1711 Unilateral primary osteoarthritis, right knee: Secondary | ICD-10-CM

## 2019-07-24 DIAGNOSIS — M542 Cervicalgia: Secondary | ICD-10-CM

## 2019-07-24 DIAGNOSIS — M25512 Pain in left shoulder: Secondary | ICD-10-CM | POA: Diagnosis not present

## 2019-07-24 HISTORY — DX: Unilateral primary osteoarthritis, right knee: M17.11

## 2019-07-24 NOTE — Progress Notes (Signed)
[name] [place],  [address] Phone: [phone] Subjective:   Lori [name], am serving as a scribe for Dr. Hulan Saas.  I'm seeing this patient by the request  of:    CC: right knee pain   [id number]  Lori [name] is a 68 y.o. female coming in with complaint of right knee pain. Pain and swelling over lateral aspect of knee. Swelling occurring for 2 months. Patient's swelling increases with activity. Patient enjoys walking and is able to go for walks but is in pain during the activity.  Also having left shoulder pain for 1 month. Patient states that pain is throughout the joint. [name] pattern to her pain but it can be sharp. Has to use her contralateral hand to move arm when she has intense pain. Can wake up with numbness in the arm in the mornings.     Past Medical History:  Diagnosis Date  . CTS (carpal tunnel syndrome)    chronic  . Depression 01/16/2013  . Osteopenia   . PONV (postoperative nausea and vomiting)   . Tubular adenoma   . UTI (urinary tract infection)    Past Surgical History:  Procedure Laterality Date  . ABDOMINAL HYSTERECTOMY  1994   fibroids  . APPENDECTOMY    . BUNIONECTOMY Right   . BUNIONECTOMY Left 2018  . CHOLECYSTECTOMY    . COLONOSCOPY  12/09/2008   Dr.Rourk- normal rectum, polyp at the hepatic flexure bx= tubular adenoma. the remainder of the colonic mucosa and terminal ileum mucosa appeared normal.  . COLONOSCOPY  2006   Dr.Sam Ayrshire- colonic adenoma  . COLONOSCOPY N/A 06/25/2014   Procedure: COLONOSCOPY;  Surgeon: Daneil Dolin, MD;  Location: [facility];  Service: Endoscopy;  Laterality: N/A;  1030 DR REQUEST TIME  . KNEE ARTHROSCOPY Right    torn meniscus  . ORIF TOE FRACTURE Right   . rotator Cuff surgery    . TUBAL LIGATION     Social History   Socioeconomic History  . Marital status: Widowed    Spouse name: Not on file  . Number of children: 2  . Years of education:  Not on file  . Highest education level: Not on file  Occupational History  . Not on file  Social Needs  . Financial resource strain: Not on file  . Food insecurity    Worry: Not on file    Inability: Not on file  . Transportation needs    Medical: Not on file    Non-medical: Not on file  Tobacco Use  . Smoking status: Former Smoker    Packs/day: 1.00    Years: 10.00    Pack years: 10.00    Types: Cigarettes    Start date: 04/13/1971    Quit date: 11/01/1993    Years since quitting: 25.7  . Smokeless tobacco: Never Used  Substance and Sexual Activity  . Alcohol use: Yes    Alcohol/week: 0.0 standard drinks    Comment: wine  . Drug use: [name]  . Sexual activity: Yes    Birth control/protection: Surgical  Lifestyle  . Physical activity    Days per week: Not on file    Minutes per session: Not on file  . Stress: Not on file  Relationships  . Social Herbalist on phone: Not on file    Gets together: Not on file    Attends religious service: Not on file  Active member of club or organization: Not on file    Attends meetings of clubs or organizations: Not on file    Relationship status: Not on file  Other Topics Concern  . Not on file  Social History Narrative   Exercising regularly - senior exercises, walking   Has 2 children.   Allergies  Allergen Reactions  . Codeine     nausea  . Penicillins Nausea And Vomiting  . Tramadol    Family History  Problem Relation Age of Onset  . Bipolar disorder Sister   . Schizophrenia Sister   . Kidney disease Sister   . Hypertension Sister   . Bipolar disorder Sister   . Kidney disease Sister   . Hypertension Mother   . Mental illness Mother   . Dementia Mother   . Cancer Father 86       colon; bladder  . Hypertension Father   . Heart disease Father   . Arthritis Father   . Bipolar disorder Son        Current Outpatient Medications (Analgesics):  Marland Kitchen  SUMAtriptan (IMITREX) 50 MG tablet, Take 1 tablet (50 mg  total) by mouth every 2 (two) hours as needed for migraine. May repeat in 2 hours if headache persists or recurs.   Current Outpatient Medications (Other):  .  b complex vitamins capsule, Take 1 capsule by mouth daily. .  Calcium Carbonate-Vitamin D 600-400 MG-UNIT tablet, Take 1 tablet by mouth daily.    Past medical history, social, surgical and family history all reviewed in electronic medical record.  [name] pertanent information unless stated regarding to the chief complaint.   Review of Systems:  [name] headache, visual changes, nausea, vomiting, diarrhea, constipation, dizziness, abdominal pain, skin rash, fevers, chills, night sweats, weight loss, swollen lymph nodes, body aches, joint swelling, muscle aches, chest pain, shortness of breath, mood changes.   Objective  Blood pressure 122/70, pulse 95, height 5\' 5"  (1.651 m), weight 171 lb (77.6 kg), SpO2 97 %.    General: [name] apparent distress alert and oriented x3 mood and affect normal, dressed appropriately.  HEENT: Pupils equal, extraocular movements intact  Respiratory: Patient's speak in full sentences and does not appear short of breath  Cardiovascular: [name] lower extremity edema, non tender, [name] erythema  Skin: Warm dry intact with [name] signs of infection or rash on extremities or on axial skeleton.  Abdomen: Soft nontender  Neuro: Cranial nerves II through XII are intact, neurovascularly intact in all extremities with 2+ DTRs and 2+ pulses.  Lymph: [name] lymphadenopathy of posterior or anterior cervical chain or axillae bilaterally.  Gait normal with good balance and coordination.  MSK:  Non tender with full range of motion and good stability and symmetric strength and tone of , elbows, wrist, hip, nd ankles bilaterally.  Shoulder left: Inspection reveals [name] abnormalities, atrophy or asymmetry. Palpation is normal with [name] tenderness over AC joint or bicipital groove. ROM is full in all planes. Rotator cuff strength normal throughout.  [name] signs of impingement with negative Neer and Hawkin's tests, empty can sign. Speeds and Yergason's tests normal. [name] labral pathology noted with negative Obrien's, negative clunk and good stability. Normal scapular function observed. [name] painful arc and [name] drop arm sign. [name] apprehension sign Knee: Right valgus deformity noted.  Abnormal thigh to calf ratio.  Tender to palpation over medial and PF joint line.  ROM full in flexion and extension and lower leg rotation. instability with valgus force.  painful patellar compression. Patellar  glide with moderate crepitus. Patellar and quadriceps tendons unremarkable. Hamstring and quadriceps strength is normal. Contralateral knee shows minimal to [name] arthritis   Ltd MSk of right knee shows Severe narrowing of medial compartment and mild narrowing of PF and lateral joint space.  Impression: Knee OA   Impression and Recommendations:     This case required medical decision making of moderate complexity. The above documentation has been reviewed and is accurate and complete Lyndal Pulley, DO       Note: This dictation was prepared with Dragon dictation along with smaller phrase technology. Any transcriptional errors that result from this process are unintentional.

## 2019-07-24 NOTE — Assessment & Plan Note (Signed)
On exam nothing abnormal Hx is concerning for possible GERD.  Discussed with patient to watch for CP, keep log, check pulse If SOB  Go to ER immediately.  RTC in 5 weeks if shoulder hurts more with movement.

## 2019-07-24 NOTE — Assessment & Plan Note (Addendum)
Right knee arthritis.  Patient mostly bone-on-bone of the medial compartment. Instability and abnormal thigh to calf ratio.  Custom medial unloaders recommended.  HEP, Icing  Discussed if worsen then will do injecition RTC in 4 weeks

## 2019-07-24 NOTE — Patient Instructions (Addendum)
Good to see you.  Ice 20 minutes 2 times daily. Usually after activity and before bed. They will call you about the custom brace  Voltaren Gel over the counter Turmeric 500mg  daily  Tart cherry extract 1200mg  at night  Vitamin D 2000 IU daily  Count heart beats when that occurs. If irregular go to emergency room See me again in 5-6 weeks

## 2019-08-15 ENCOUNTER — Ambulatory Visit: Payer: Federal, State, Local not specified - PPO

## 2019-08-28 ENCOUNTER — Ambulatory Visit (INDEPENDENT_AMBULATORY_CARE_PROVIDER_SITE_OTHER): Payer: Self-pay | Admitting: *Deleted

## 2019-08-28 ENCOUNTER — Other Ambulatory Visit: Payer: Self-pay

## 2019-08-28 DIAGNOSIS — Z86010 Personal history of colonic polyps: Secondary | ICD-10-CM

## 2019-08-28 MED ORDER — PEG 3350-KCL-NA BICARB-NACL 420 G PO SOLR: 4000.0000 mL | Freq: Once | ORAL | 0 refills | Status: AC

## 2019-08-28 NOTE — Progress Notes (Signed)
Ok to schedule.

## 2019-08-28 NOTE — Progress Notes (Addendum)
Gastroenterology Pre-Procedure Review  Request Date: 08/28/2019 Requesting Physician: 5 year recall, Last TCS 06/25/2014 done by Dr. Gala Romney, tubular adenoma, hyperplastic polyp, polypoid colorectal mucosa  PATIENT REVIEW QUESTIONS: The patient responded to the following health history questions as indicated:    1. Diabetes Melitis: no 2. Joint replacements in the past 12 months: no 3. Major health problems in the past 3 months: no 4. Has an artificial valve or MVP: no 5. Has a defibrillator: no 6. Has been advised in past to take antibiotics in advance of a procedure like teeth cleaning: no 7. Family history of colon cancer: yes, dad age: 33  8. Alcohol Use: yes, 2 or 3 glasses of wine a month 9. Illicit drug Use: no 10. History of sleep apnea: no 11. History of coronary artery or other vascular stents placed within the last 12 months: no 12. History of any prior anesthesia complications: yes, nausea 13. There is no height or weight on file to calculate BMI. ht: 5'4 3/4 wt: 170 lbs     MEDICATIONS & ALLERGIES:    Patient reports the following regarding taking any blood thinners:   Plavix? no Aspirin? no Coumadin? no Brilinta? no Xarelto? no Eliquis? no Pradaxa? no Savaysa? no Effient? no  Patient confirms/reports the following medications:  Current Outpatient Medications  Medication Sig Dispense Refill  . b complex vitamins capsule Take 1 capsule by mouth daily.    . Calcium Carbonate-Vitamin D 600-400 MG-UNIT tablet Take 1 tablet by mouth daily.    . Cholecalciferol (VITAMIN D3) 50 MCG (2000 UT) TABS Take by mouth daily.    . Ginkgo Biloba 60 MG CAPS Take by mouth daily.    . Misc Natural Products (TART CHERRY ADVANCED) CAPS Take by mouth daily.    Marland Kitchen pyridOXINE (VITAMIN B-6) 100 MG tablet Take 100 mg by mouth daily.    . Turmeric (QC TUMERIC COMPLEX PO) Take by mouth daily.    . vitamin B-12 (CYANOCOBALAMIN) 500 MCG tablet Take 500 mcg by mouth daily.    . Vitamin E 180 MG  CAPS Take by mouth daily.     No current facility-administered medications for this visit.     Patient confirms/reports the following allergies:  Allergies  Allergen Reactions  . Codeine     nausea  . Penicillins Nausea And Vomiting  . Tramadol     No orders of the defined types were placed in this encounter.   AUTHORIZATION INFORMATION Primary Insurance: [place],  [place] #: [id number],  Group #: [id number] Pre-Cert / Auth required: No, not required  SCHEDULE INFORMATION: Procedure has been scheduled as follows:  Date: 11/19/2019, Time: 9:30 Location: [facility] with Dr. Gala Romney  This Gastroenterology Pre-Precedure Review Form is being routed to the following provider(s): Neil Crouch, PA-C

## 2019-08-28 NOTE — Patient Instructions (Signed)
Lori [name]   02/17/1951 MRN: [id number]    Procedure Date: 11/19/2019 Time to register: 8:30 am Place to register: [facility] Procedure Time: 9:30 am Scheduled provider: Dr. Gala Romney  PREPARATION FOR COLONOSCOPY WITH TRI-LYTE SPLIT PREP  Please notify us immediately if you are diabetic, take iron supplements, or if you are on Coumadin or any other blood thinners.   You will need to purchase 1 fleet enema and 1 box of Bisacodyl 85m tablets.   2 DAYS BEFORE PROCEDURE:  DATE: 11/17/2019   DAY: Sunday Begin clear liquid diet AFTER your lunch meal. NO SOLID FOODS after this point.  1 DAY BEFORE PROCEDURE:  DATE: 11/18/2019   DAY: Monday Continue clear liquids the entire day - NO SOLID FOOD.    At 2:00 pm:  Take 2 Bisacodyl tablets.   At 4:00pm:  Start drinking your solution. Make sure you mix well per instructions on the bottle. Try to drink 1 (one) 8 ounce glass every 10-15 minutes until you have consumed HALF the jug. You should complete by 6:00pm.You must keep the left over solution refrigerated until completed next day.  Continue clear liquids. You must drink plenty of clear liquids to prevent dehyration and kidney failure.     DAY OF PROCEDURE:   DATE: 11/19/2019   DAY: Tuesday If you take medications for your heart, blood pressure or breathing, you may take these medications.   Five hours before your procedure time @ 4:30 am:  Finish remaining amout of bowel prep, drinking 1 (one) 8 ounce glass every 10-15 minutes until complete. You have two hours to consume remaining prep.   Three hours before your procedure time @ 6:30 am:  Nothing by mouth.   At least one hour before going to the hospital:  Give yourself one Fleet enema. You may take your morning medications with sip of water unless we have instructed otherwise.      Please see below for Dietary Information.  CLEAR LIQUIDS INCLUDE:  Water Jello (NOT red in color)   Ice Popsicles (NOT red in color)   Tea  (sugar ok, no milk/cream) Powdered fruit flavored drinks  Coffee (sugar ok, no milk/cream) Gatorade/ Lemonade/ Kool-Aid  (NOT red in color)   Juice: apple, white grape, white cranberry Soft drinks  Clear bullion, consomme, broth (fat free beef/chicken/vegetable)  Carbonated beverages (any kind)  Strained chicken noodle soup Hard Candy   Remember: Clear liquids are liquids that will allow you to see your fingers on the other side of a clear glass. Be sure liquids are NOT red in color, and not cloudy, but CLEAR.  DO NOT EAT OR DRINK ANY OF THE FOLLOWING:  Dairy products of any kind   Cranberry juice Tomato juice / V8 juice   Grapefruit juice Orange juice     Red grape juice  Do not eat any solid foods, including such foods as: cereal, oatmeal, yogurt, fruits, vegetables, creamed soups, eggs, bread, crackers, pureed foods in a blender, etc.   HELPFUL HINTS FOR DRINKING PREP SOLUTION:   Make sure prep is extremely cold. Mix and refrigerate the the morning of the prep. You may also put in the freezer.   You may try mixing some Crystal Light or Country Time Lemonade if you prefer. Mix in small amounts; add more if necessary.  Try drinking through a straw  Rinse mouth with water or a mouthwash between glasses, to remove after-taste.  Try sipping on a cold beverage /ice/ popsicles between glasses  of prep.  Place a piece of sugar-free hard candy in mouth between glasses.  If you become nauseated, try consuming smaller amounts, or stretch out the time between glasses. Stop for 30-60 minutes, then slowly start back drinking.        OTHER INSTRUCTIONS  You will need a responsible adult at least 66 years of age to accompany you and drive you home. This person must remain in the waiting room during your procedure. The hospital will cancel your procedure if you do not have a responsible adult with you.   1. Wear loose fitting clothing that is easily removed. 2. Leave jewelry and other  valuables at home.  3. Remove all body piercing jewelry and leave at home. 4. Total time from sign-in until discharge is approximately 2-3 hours. 5. You should go home directly after your procedure and rest. You can resume normal activities the day after your procedure. 6. The day of your procedure you should not:  Drive  Make legal decisions  Operate machinery  Drink alcohol  Return to work   You may call the office (Dept: [phone]) before 5:00pm, or page the doctor on call [phone]) after 5:00pm, for further instructions, if necessary.   Insurance Information YOU WILL NEED TO CHECK WITH YOUR INSURANCE COMPANY FOR THE BENEFITS OF COVERAGE YOU HAVE FOR THIS PROCEDURE.  UNFORTUNATELY, NOT ALL INSURANCE COMPANIES HAVE BENEFITS TO COVER ALL OR PART OF THESE TYPES OF PROCEDURES.  IT IS YOUR RESPONSIBILITY TO CHECK YOUR BENEFITS, HOWEVER, WE WILL BE GLAD TO ASSIST YOU WITH ANY CODES YOUR INSURANCE COMPANY MAY NEED.    PLEASE NOTE THAT MOST INSURANCE COMPANIES WILL NOT COVER A SCREENING COLONOSCOPY FOR PEOPLE UNDER THE AGE OF 50  IF YOU HAVE [employer] INSURANCE, YOU MAY HAVE BENEFITS FOR A SCREENING COLONOSCOPY BUT IF POLYPS ARE FOUND THE DIAGNOSIS WILL CHANGE AND THEN YOU MAY HAVE A DEDUCTIBLE THAT WILL NEED TO BE MET. SO PLEASE MAKE SURE YOU CHECK YOUR BENEFITS FOR A SCREENING COLONOSCOPY AS WELL AS A DIAGNOSTIC COLONOSCOPY.

## 2019-08-29 NOTE — Addendum Note (Signed)
Addended by: Metro Kung on: 08/29/2019 08:12 AM   Modules accepted: Orders, SmartSet

## 2019-09-02 ENCOUNTER — Telehealth: Payer: Self-pay | Admitting: Gastroenterology

## 2019-09-02 NOTE — Telephone Encounter (Signed)
Should be fine by me. If she would like to have a sooner appointment, please set her up in [facility]. Thx  RG

## 2019-09-02 NOTE — Telephone Encounter (Signed)
Patient is requesting to transfer back to our office. Patient states that she now lives in [facility] and does not want to travel to [place]. Patient says that she has been having rectal pain.   Dr. Lyndel Safe is Doc of the Day for 09/02/19 am. Records placed on his desk for review.

## 2019-09-03 NOTE — Telephone Encounter (Signed)
Called and spoke with patient-patient has requested to be scheduled for an appt so she can have her colonoscopy (has been 5 years) and she reports she has a family hx of colon "issues"-patient has been scheduled for an appt with Dr. Lyndel Safe on 09/17/2019 at 3:00 pm; patient has been made aware that this appt is at the [facility] office-address given to patient-Patient advised to call back to the office at [phone] should questions/concerns arise;  Patient verbalized understanding of information/instructions;

## 2019-09-17 ENCOUNTER — Other Ambulatory Visit: Payer: Self-pay

## 2019-09-17 ENCOUNTER — Ambulatory Visit: Payer: Federal, State, Local not specified - PPO | Admitting: Gastroenterology

## 2019-09-17 ENCOUNTER — Encounter: Payer: Self-pay | Admitting: Gastroenterology

## 2019-09-17 VITALS — BP 124/76 | HR 78 | Temp 98.7°F | Ht 64.0 in | Wt 174.4 lb

## 2019-09-17 DIAGNOSIS — Z800 Family history of malignant neoplasm of digestive organs: Secondary | ICD-10-CM | POA: Diagnosis not present

## 2019-09-17 DIAGNOSIS — Z1159 Encounter for screening for other viral diseases: Secondary | ICD-10-CM

## 2019-09-17 DIAGNOSIS — Z86010 Personal history of colonic polyps: Secondary | ICD-10-CM

## 2019-09-17 MED ORDER — CLENPIQ 10-3.5-12 MG-GM -GM/160ML PO SOLN: 1.0000 | Freq: Once | ORAL | 0 refills | Status: AC

## 2019-09-17 NOTE — Progress Notes (Signed)
Chief Complaint:   Referring Provider:  Binnie Rail, MD      ASSESSMENT AND PLAN;   #1. H/O polyps  #2. FH colon cancer (age 68)  #3.  Chronic constipation.  Plan: - benefiber 1 tbs po qd with 8 ounces of water. - Continue current water intake.  - Continue to exercise as she is currently doing. - Proceed with colonoscopy with CLENPIQ (samples given). Discussed risks & benefits. (Risks including rare perforation req laparotomy, bleeding after biopsies/polypectomy req blood transfusion, rare chance of missing neoplasms, risks of anesthesia/sedation). Benefits outweigh the risks. Patient agrees to proceed. All the questions were answered. Consent forms given for review.    HPI:    Lori [name] is a 68 y.o. female  Here for colonoscopy d/t H/O tubular adenomas and family history of colon cancer Has constipation with occasional straining.  Does have longstanding history of constipation with pellet-like stools.  She drinks plenty of water and exercises regularly.  Denies use of nonsteroidals.  She does take calcium.  She does not take any magnesium.  She does have bowel movement every day.  At times every other day.  Stool is usually hard.  She does have mild lower abdominal pain at times.  No significant abdominal bloating.  No nausea, vomiting, heartburn, regurgitation, odynophagia or dysphagia.  There is no melena or hematochezia. No unintentional weight loss.  Past GI procedures: -Most recent colonoscopy 06/25/2014 (Dr Gala Romney): 2 hepatic flexure colonic polyps s/p cold snare polypectomy.  Colonic diverticulosis.  Recommended to repeat in 5 years due to previous history of colonic polyps.  SH -lost her husband who had advanced multiple myeloma. Past Medical History:  Diagnosis Date  . CTS (carpal tunnel syndrome)    chronic  . Depression 01/16/2013  . History of colon polyps   . Osteopenia   . PONV (postoperative nausea and vomiting)   . Tubular adenoma   . UTI (urinary  tract infection)     Past Surgical History:  Procedure Laterality Date  . ABDOMINAL HYSTERECTOMY  1994   fibroids  . APPENDECTOMY    . BUNIONECTOMY Right   . BUNIONECTOMY Left 2018  . CHOLECYSTECTOMY    . COLONOSCOPY  12/09/2008   Dr.Rourk- normal rectum, polyp at the hepatic flexure bx= tubular adenoma. the remainder of the colonic mucosa and terminal ileum mucosa appeared normal.  . COLONOSCOPY  2006   Dr.Sam Progress- colonic adenoma  . COLONOSCOPY N/A 06/25/2014   Procedure: COLONOSCOPY;  Surgeon: Daneil Dolin, MD;  Location: [facility];  Service: Endoscopy;  Laterality: N/A;  1030 DR REQUEST TIME  . KNEE ARTHROSCOPY Right    torn meniscus  . ORIF TOE FRACTURE Right   . rotator Cuff surgery    . TUBAL LIGATION      Family History  Problem Relation Age of Onset  . Bipolar disorder Sister   . Schizophrenia Sister   . Kidney disease Sister   . Hypertension Sister   . Bipolar disorder Sister   . Kidney disease Sister   . Hypertension Mother   . Mental illness Mother   . Dementia Mother   . Pancreatic cancer Mother   . Cancer Father 72       colon; bladder  . Hypertension Father   . Heart disease Father   . Arthritis Father   . Prostate cancer Father   . Esophageal cancer Father   . Stomach cancer Father   . Ovarian cancer Daughter   .  Bipolar disorder Son     Social History   Tobacco Use  . Smoking status: Former Smoker    Packs/day: 1.00    Years: 10.00    Pack years: 10.00    Types: Cigarettes    Start date: 04/13/1971    Quit date: 11/01/1993    Years since quitting: 25.8  . Smokeless tobacco: Never Used  Substance Use Topics  . Alcohol use: Yes    Alcohol/week: 0.0 standard drinks    Comment: wine  . Drug use: No    Current Outpatient Medications  Medication Sig Dispense Refill  . Ascorbic Acid (VITAMIN C) 500 MG CHEW Chew 1 tablet by mouth daily.    Marland Kitchen b complex vitamins capsule Take 1 capsule by mouth daily.    . Calcium Carbonate-Vitamin D  600-400 MG-UNIT tablet Take 1 tablet by mouth daily.    . Cholecalciferol (VITAMIN D3) 50 MCG (2000 UT) TABS Take by mouth daily.    . Ginkgo Biloba 60 MG CAPS Take by mouth daily.    . Misc Natural Products (TART CHERRY ADVANCED) CAPS Take by mouth daily.    Marland Kitchen pyridOXINE (VITAMIN B-6) 100 MG tablet Take 100 mg by mouth daily.    . Turmeric (QC TUMERIC COMPLEX PO) Take by mouth daily.    . vitamin B-12 (CYANOCOBALAMIN) 500 MCG tablet Take 500 mcg by mouth daily.    . Vitamin E 180 MG CAPS Take by mouth daily.     No current facility-administered medications for this visit.     Allergies  Allergen Reactions  . Codeine     nausea  . Penicillins Nausea And Vomiting  . Tramadol     Review of Systems:  Constitutional: Denies fever, chills, diaphoresis, appetite change and fatigue.  HEENT: Denies photophobia, eye pain, redness, hearing loss, ear pain, congestion, sore throat, rhinorrhea, sneezing, mouth sores, neck pain, neck stiffness and tinnitus.   Respiratory: Denies SOB, DOE, cough, chest tightness,  and wheezing.   Cardiovascular: Denies chest pain, palpitations and leg swelling.  Genitourinary: Denies dysuria, urgency, frequency, hematuria, flank pain and difficulty urinating.  Musculoskeletal: Denies myalgias, back pain, joint swelling, arthralgias and gait problem.  Skin: No rash.  Neurological: Denies dizziness, seizures, syncope, weakness, light-headedness, numbness and headaches.  Hematological: Denies adenopathy. Easy bruising, personal or family bleeding history  Psychiatric/Behavioral: No anxiety or depression     Physical Exam:    BP 124/76   Pulse 78   Temp 98.7 F (37.1 C)   Ht _0  (1.626 m)   Wt 174 lb 6 oz (79.1 kg)   BMI 29.93 kg/m  Filed Weights   09/17/19 1442  Weight: 174 lb 6 oz (79.1 kg)   Constitutional:  Well-developed, in no acute distress. Psychiatric: Normal mood and affect. Behavior is normal. HEENT: Pupils normal.  Conjunctivae are  normal. No scleral icterus. Neck supple.  Cardiovascular: Normal rate, regular rhythm. No edema Pulmonary/chest: Effort normal and breath sounds normal. No wheezing, rales or rhonchi. Abdominal: Soft, nondistended. Nontender. Bowel sounds active throughout. There are no masses palpable. No hepatomegaly. Rectal:  defered Neurological: Alert and oriented to person place and time. Skin: Skin is warm and dry. No rashes noted.  Data Reviewed: I have personally reviewed following labs and imaging studies  CBC: CBC Latest Ref Rng & Units 05/22/2019 11/14/2018 05/08/2018  WBC 4.0 - 10.5 K/uL 3.6(L) 3.7(L) 3.8(L)  Hemoglobin 12.0 - 15.0 g/dL 14.8 14.0 14.2  Hematocrit 36.0 - 46.0 % 43.2 40.3 41.0  Platelets 150.0 - 400.0 K/uL 267.0 274.0 256.0    CMP: CMP Latest Ref Rng & Units 05/22/2019 11/14/2018 05/08/2018  Glucose 70 - 99 mg/dL 107(H) 94 107(H)  BUN 6 - 23 mg/dL _0 Creatinine 0.40 - 1.20 mg/dL 0.89 0.83 0.85  Sodium 135 - 145 mEq/L 140 139 141  Potassium 3.5 - 5.1 mEq/L 4.2 3.9 4.0  Chloride 96 - 112 mEq/L 103 102 102  CO2 19 - 32 mEq/L _1 Calcium 8.4 - 10.5 mg/dL 9.6 9.5 9.3  Total Protein 6.0 - 8.3 g/dL 7.3 - 7.1  Total Bilirubin 0.2 - 1.2 mg/dL 0.9 - 0.9  Alkaline Phos 39 - 117 U/L 69 - 70  AST 0 - 37 U/L 19 - 18  ALT 0 - 35 U/L 16 - 16      Carmell Austria, MD 09/17/2019, 2:57 PM  Cc: Binnie Rail, MD

## 2019-09-17 NOTE — Patient Instructions (Signed)
If you are age 66 or older, your body mass index should be between 23-30. Your Body mass index is 29.93 kg/m. If this is out of the aforementioned range listed, please consider follow up with your Primary Care Provider.  If you are age 42 or younger, your body mass index should be between 19-25. Your Body mass index is 29.93 kg/m. If this is out of the aformentioned range listed, please consider follow up with your Primary Care Provider.   You have been scheduled for a colonoscopy. Please follow written instructions given to you at your visit today.  Please pick up your prep supplies at the pharmacy within the next 1-3 days. If you use inhalers (even only as needed), please bring them with you on the day of your procedure. Your physician has requested that you go to www.startemmi.com and enter the access code given to you at your visit today. This web site gives a general overview about your procedure. However, you should still follow specific instructions given to you by our office regarding your preparation for the procedure.  Please purchase the following medications over the counter and take as directed: Benefiber 1 tablespoon   We have given you samples of the following medication to take: Clenpiq  Due to recent COVID-19 restrictions implemented by [employer] and state authorities and in an effort to keep both patients and staff as safe as possible, [facility] requires COVID-19 testing prior to any scheduled endoscopic procedure. The testing center is located at [address] [place], [place] [address] in the [facility] [employer]  suite.  Your appointment has been scheduled for 09/30/19 at 1:05pm.   Please bring your insurance cards to this appointment. You will require your COVID screen 2 business days prior to your endoscopic procedure.  You are not required to quarantine after your screening.  You will only receive a phone call  with the results if it is POSITIVE.  If you do not receive a call the day before your procedure you should begin your prep, if ordered, and you should report to the endo center for your procedure at your designated appointment arrival time ( one hour prior to the procedure time). There is no cost to you for the screening on the day of the swab.  [facility] will file with your insurance company for the testing.    You may receive an automated phone call prior to your procedure or have a message in your MyChart that you have an appointment for a BP/15 at the [facility], please disregard this message.  Your testing will be at the [place] [place] location.   If you are leaving [facility] travel [place] on [address], turn left onto [facility] [address], turn night onto [address] at the 1st stop light turn right, pass the [employer] on your right and proceed to [address] (white building).    Thank you,  Dr. Jackquline Denmark

## 2019-09-18 ENCOUNTER — Encounter: Payer: Self-pay | Admitting: Gastroenterology

## 2019-09-18 NOTE — Progress Notes (Signed)
Patient called back and stated she would like to cancel her procedure.  She is going to another GI doctor to have it done.  She wanted it done this year.   I made endoscopy aware of the cancellation.

## 2019-09-30 ENCOUNTER — Other Ambulatory Visit: Payer: Self-pay | Admitting: Gastroenterology

## 2019-09-30 ENCOUNTER — Ambulatory Visit (INDEPENDENT_AMBULATORY_CARE_PROVIDER_SITE_OTHER): Payer: Federal, State, Local not specified - PPO

## 2019-09-30 DIAGNOSIS — Z1159 Encounter for screening for other viral diseases: Secondary | ICD-10-CM

## 2019-10-01 LAB — SARS CORONAVIRUS 2 (TAT 6-24 HRS): SARS Coronavirus 2: NEGATIVE

## 2019-10-02 ENCOUNTER — Other Ambulatory Visit: Payer: Self-pay

## 2019-10-02 ENCOUNTER — Encounter: Payer: Self-pay | Admitting: Gastroenterology

## 2019-10-02 ENCOUNTER — Ambulatory Visit (AMBULATORY_SURGERY_CENTER): Payer: Federal, State, Local not specified - PPO | Admitting: Gastroenterology

## 2019-10-02 VITALS — BP 124/77 | HR 86 | Temp 98.2°F | Resp 17 | Ht 64.0 in | Wt 174.0 lb

## 2019-10-02 DIAGNOSIS — Z86010 Personal history of colonic polyps: Secondary | ICD-10-CM | POA: Diagnosis present

## 2019-10-02 DIAGNOSIS — D123 Benign neoplasm of transverse colon: Secondary | ICD-10-CM

## 2019-10-02 DIAGNOSIS — Z800 Family history of malignant neoplasm of digestive organs: Secondary | ICD-10-CM

## 2019-10-02 MED ORDER — SODIUM CHLORIDE 0.9 % IV SOLN: 500.0000 mL | Freq: Once | INTRAVENOUS | Status: DC

## 2019-10-02 NOTE — Patient Instructions (Signed)
Read all handouts given to you by your recovery room nurse.  Thank-you for choosing [place] for your healthcare needs today.  YOU HAD AN ENDOSCOPIC PROCEDURE TODAY AT [facility]:   Refer to the procedure report that was given to you for any specific questions about what was found during the examination.  If the procedure report does not answer your questions, please call your gastroenterologist to clarify.  If you requested that your care partner not be given the details of your procedure findings, then the procedure report has been included in a sealed envelope for you to review at your convenience later.  YOU SHOULD EXPECT: Some feelings of bloating in the abdomen. Passage of more gas than usual.  Walking can help get rid of the air that was put into your GI tract during the procedure and reduce the bloating. If you had a lower endoscopy (such as a colonoscopy or flexible sigmoidoscopy) you may notice spotting of blood in your stool or on the toilet paper. If you underwent a bowel prep for your procedure, you may not have a normal bowel movement for a few days.  Please Note:  You might notice some irritation and congestion in your nose or some drainage.  This is from the oxygen used during your procedure.  There is no need for concern and it should clear up in a day or so.  SYMPTOMS TO REPORT IMMEDIATELY:   Following lower endoscopy (colonoscopy or flexible sigmoidoscopy):  Excessive amounts of blood in the stool  Significant tenderness or worsening of abdominal pains  Swelling of the abdomen that is new, acute  Fever of 100F or higher   For urgent or emergent issues, a gastroenterologist can be reached at any hour by calling [phone].   DIET:  We do recommend a small meal at first, but then you may proceed to your regular diet.  Drink plenty of fluids but you should avoid alcoholic beverages for 24 hours.  Try to increase the fiber in your diet, and drink plenty of  water.  ACTIVITY:  You should plan to take it easy for the rest of today and you should NOT DRIVE or use heavy machinery until tomorrow (because of the sedation medicines used during the test).    FOLLOW UP: Our staff will call the number listed on your records 48-72 hours following your procedure to check on you and address any questions or concerns that you may have regarding the information given to you following your procedure. If we do not reach you, we will leave a message.  We will attempt to reach you two times.  During this call, we will ask if you have developed any symptoms of COVID 19. If you develop any symptoms (ie: fever, flu-like symptoms, shortness of breath, cough etc.) before then, please call [phone].  If you test positive for Covid 19 in the 2 weeks post procedure, please call and report this information to [place].    If any biopsies were taken you will be contacted by phone or by letter within the next 1-3 weeks.  Please call us at [phone] if you have not heard about the biopsies in 3 weeks.    SIGNATURES/CONFIDENTIALITY: You and/or your care partner have signed paperwork which will be entered into your electronic medical record.  These signatures attest to the fact that that the information above on your After Visit Summary has been reviewed and is understood.  Full responsibility of the confidentiality of  this discharge information lies with you and/or your care-partner.

## 2019-10-02 NOTE — Progress Notes (Signed)
Report given to PACU, vss 

## 2019-10-02 NOTE — Op Note (Signed)
[place] Patient Name: Lori [name] Procedure Date: 10/02/2019 2:07 PM MRN: [id number] Endoscopist: Jackquline Denmark , MD Age: 68 Referring MD:  Date of Birth: 02/08/1951 Gender: Female Account #: [id number] Procedure:                Colonoscopy Indications:              High risk colon cancer surveillance: Personal                            history of colonic polyps. FH of colon cancer (dad                            age 41). Medicines:                Monitored Anesthesia Care Procedure:                Pre-Anesthesia Assessment:                           - Prior to the procedure, a History and Physical                            was performed, and patient medications and                            allergies were reviewed. The patient's tolerance of                            previous anesthesia was also reviewed. The risks                            and benefits of the procedure and the sedation                            options and risks were discussed with the patient.                            All questions were answered, and informed consent                            was obtained. Prior Anticoagulants: The patient has                            taken no previous anticoagulant or antiplatelet                            agents. ASA Grade Assessment: II - A patient with                            mild systemic disease. After reviewing the risks                            and benefits, the patient was deemed in  satisfactory condition to undergo the procedure.                           After obtaining informed consent, the colonoscope                            was passed under direct vision. Throughout the                            procedure, the patient's blood pressure, pulse, and                            oxygen saturations were monitored continuously. The                            Colonoscope was introduced through the anus and                    advanced to the 2 cm into the ileum. The                            colonoscopy was performed without difficulty. The                            patient tolerated the procedure well. The quality                            of the bowel preparation was good. The terminal                            ileum, ileocecal valve, appendiceal orifice, and                            rectum were photographed. Scope In: 3:02:01 PM Scope Out: 3:19:43 PM Scope Withdrawal Time: 0 hours 9 minutes 22 seconds  Total Procedure Duration: 0 hours 17 minutes 42 seconds  Findings:                 A 6 mm polyp was found in the distal transverse                            colon. The polyp was sessile. The polyp was removed                            with a cold snare. Resection and retrieval were                            complete.                           A few small-mouthed diverticula were found in the                            sigmoid colon.  Non-bleeding internal hemorrhoids were found during                            retroflexion. The hemorrhoids were small.                           The terminal ileum appeared normal.                           The exam was otherwise without abnormality on                            direct and retroflexion views. Complications:            No immediate complications. Estimated Blood Loss:     Estimated blood loss: none. Impression:               -Colonic polyp s/p polypectomy.                           -Mild sigmoid diverticulosis.                           -Otherwise normal colonoscopy to TI. Recommendation:           - Patient has a contact number available for                            emergencies. The signs and symptoms of potential                            delayed complications were discussed with the                            patient. Return to normal activities tomorrow.                            Written discharge  instructions were provided to the                            patient.                           - Resume previous diet.                           - Continue present medications.                           - Await pathology results.                           - Repeat colonoscopy for surveillance based on                            pathology results.                           - Return  to GI clinic PRN.                           - D/W Alveta Heimlich. Jackquline Denmark, MD 10/02/2019 3:28:15 PM This report has been signed electronically.

## 2019-10-02 NOTE — Progress Notes (Signed)
Called to room to assist during endoscopic procedure.  Patient ID and intended procedure confirmed with present staff. Received instructions for my participation in the procedure from the performing physician.  

## 2019-10-02 NOTE — Progress Notes (Signed)
VS-DT Temp-JB 

## 2019-10-04 ENCOUNTER — Telehealth: Payer: Self-pay

## 2019-10-04 ENCOUNTER — Telehealth: Payer: Self-pay | Admitting: *Deleted

## 2019-10-04 NOTE — Telephone Encounter (Signed)
  Follow up Call-  Call back number 10/02/2019  Post procedure Call Back phone  # [phone]  Permission to leave phone message Yes  Some recent data might be hidden     Patient questions:  Do you have a fever, pain , or abdominal swelling? No. Pain Score  0 *  Have you tolerated food without any problems? Yes.    Have you been able to return to your normal activities? Yes.    Do you have any questions about your discharge instructions: Diet   No. Medications  No. Follow up visit  No.  Do you have questions or concerns about your Care? No.  Actions: * If pain score is 4 or above: No action needed, pain <4.  1. Have you developed a fever since your procedure? no  2.   Have you had an respiratory symptoms (SOB or cough) since your procedure? no  3.   Have you tested positive for COVID 19 since your procedure no  4.   Have you had any family members/close contacts diagnosed with the COVID 19 since your procedure?  no   If yes to any of these questions please route to Joylene John, RN and Alphonsa Gin, Therapist, sports.

## 2019-10-04 NOTE — Telephone Encounter (Signed)
  Follow up Call-  Call back number 10/02/2019  Post procedure Call Back phone  # [phone]  Permission to leave phone message Yes  Some recent data might be hidden     Left message

## 2019-10-10 ENCOUNTER — Encounter: Payer: Self-pay | Admitting: Gastroenterology

## 2019-10-30 IMAGING — DX DG KNEE COMPLETE 4+V*R*
4 series · 4 of 4 positions shown · non-contrast
Comparison: None.

CLINICAL DATA: Lateral knee pain, no acute injury, history of torn
meniscus

EXAM:
RIGHT KNEE - COMPLETE 4+ VIEW

[knee ap]
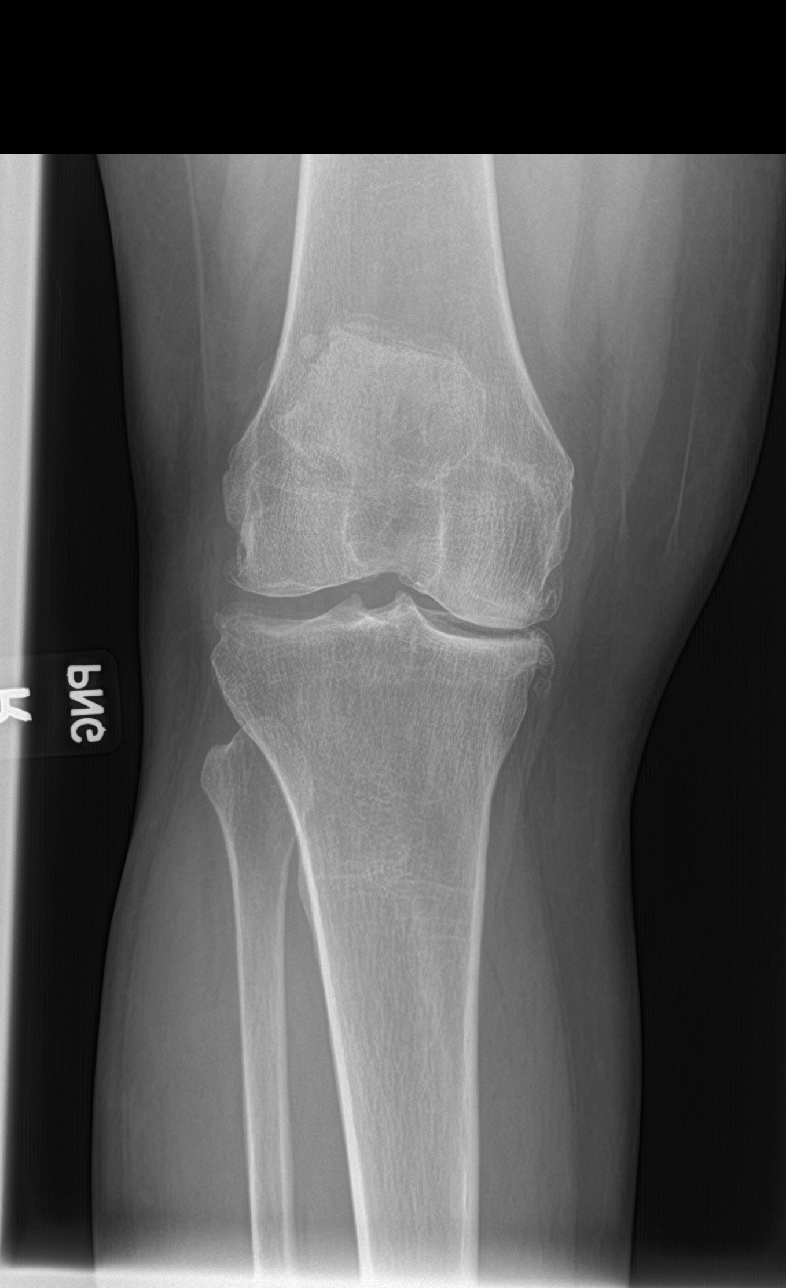

[knee tunnel]
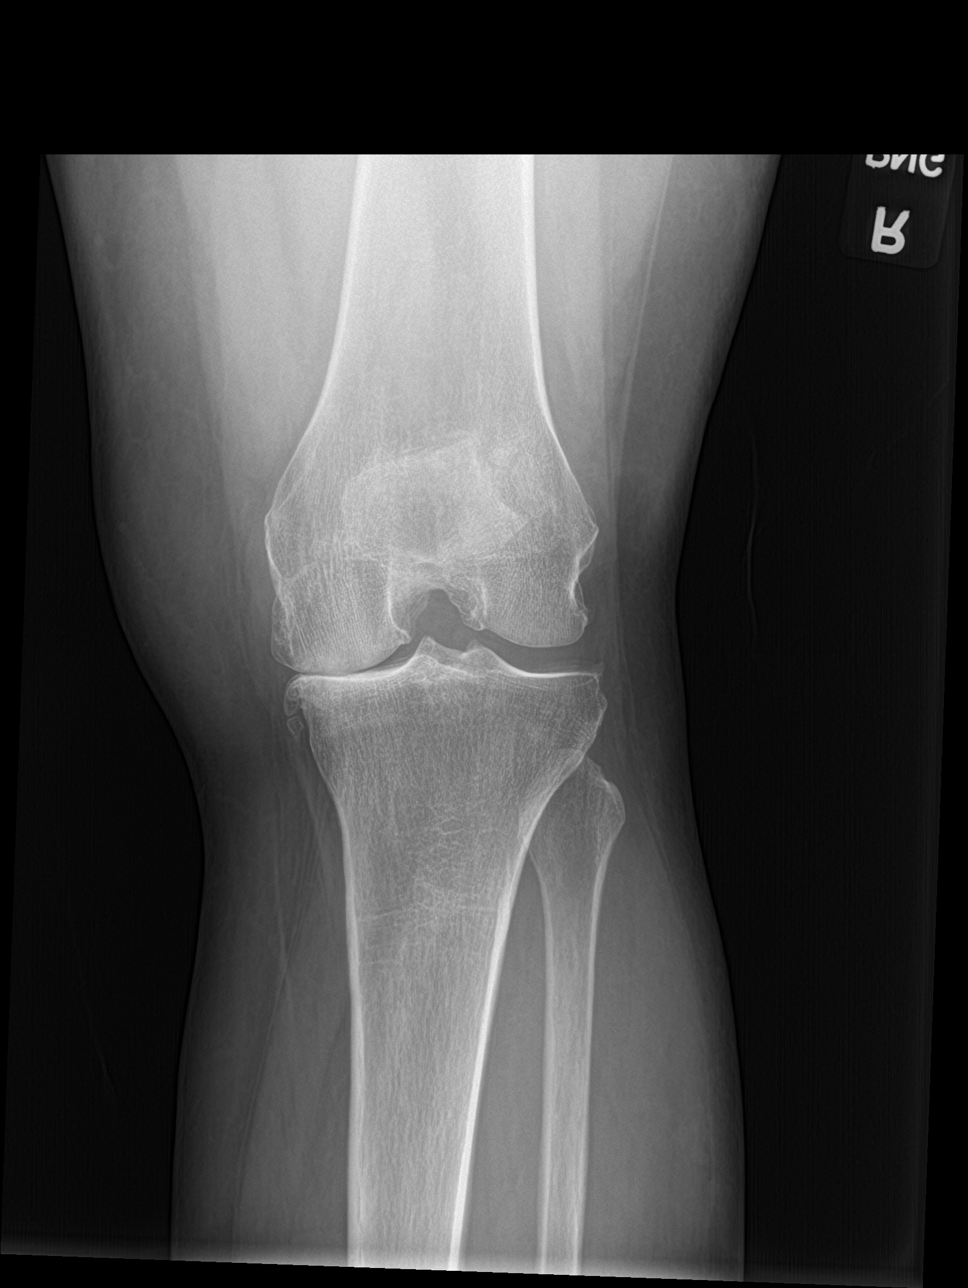

[knee lat]
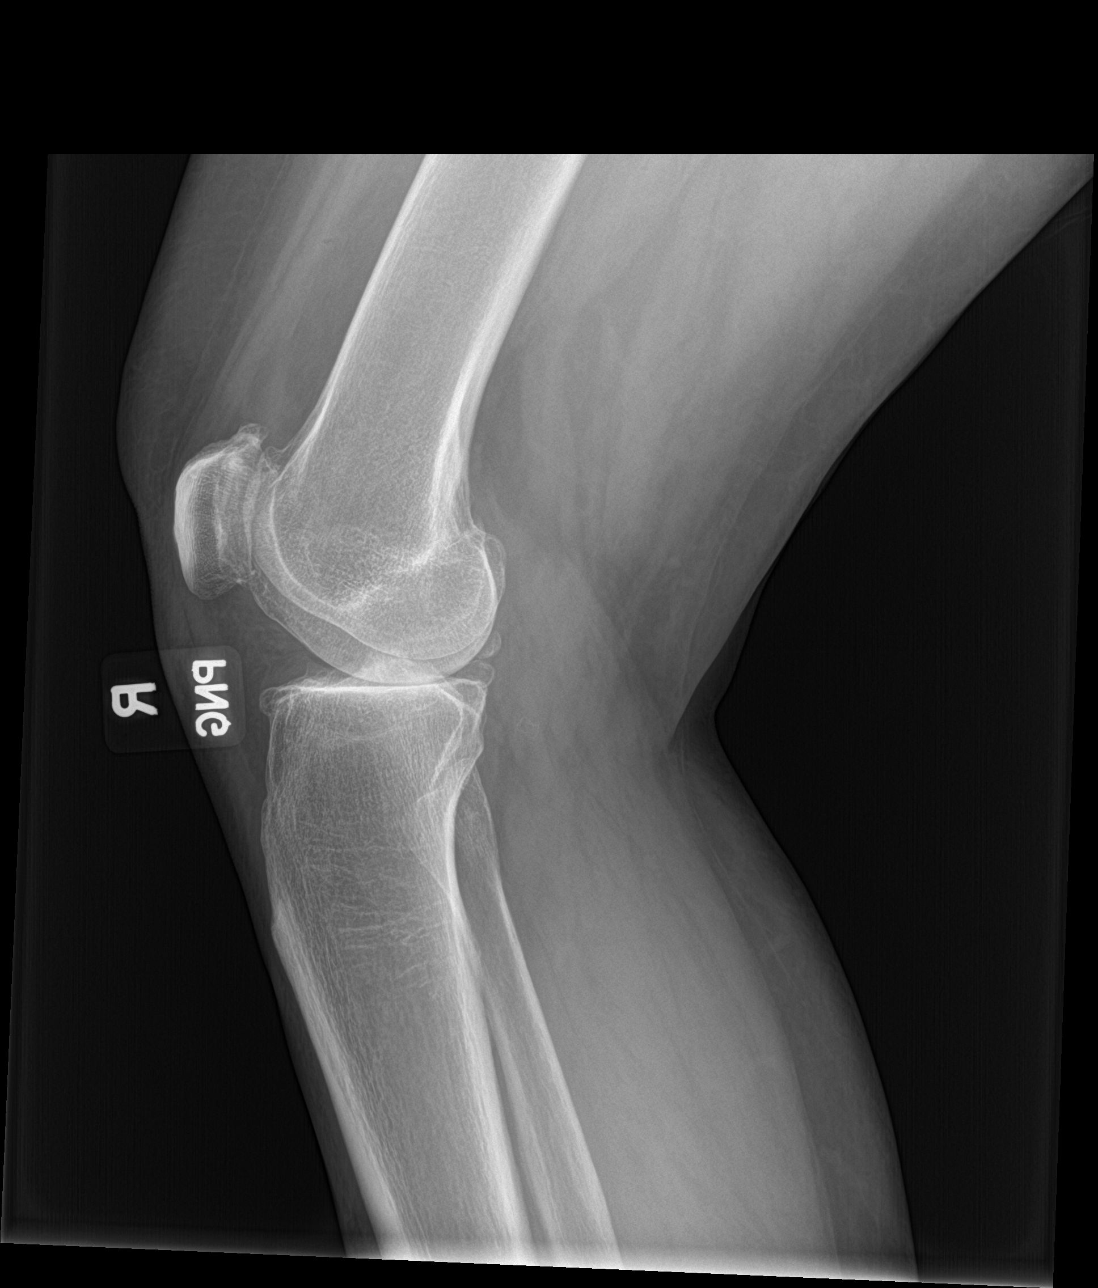

[sunrise]
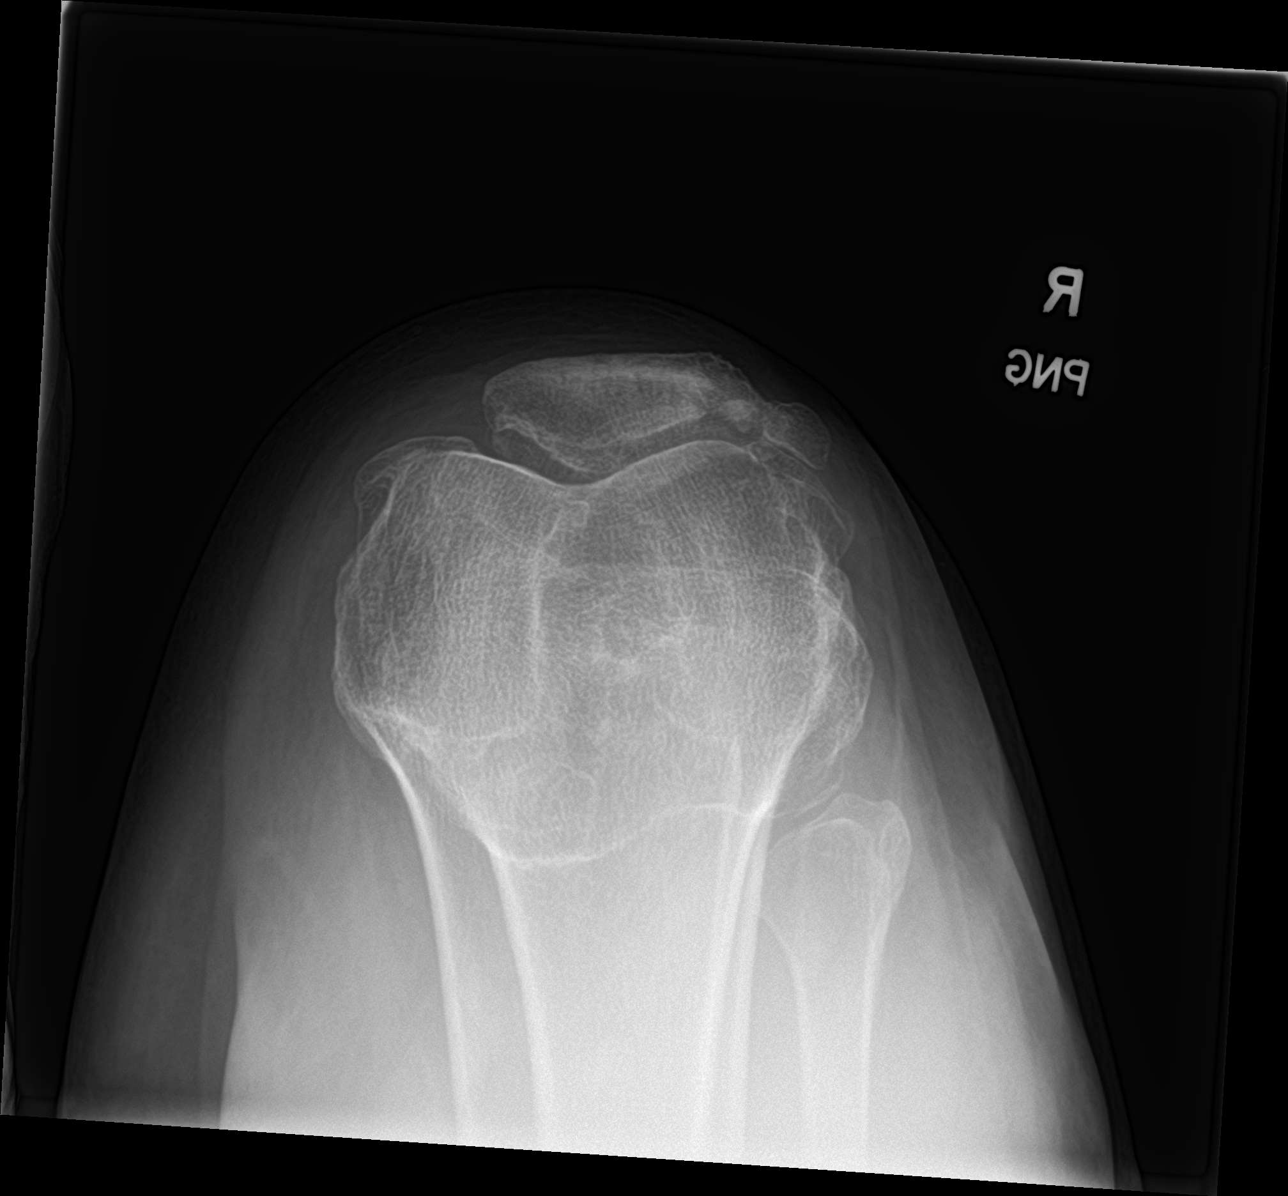

[4 of 4 positions shown; findings below may reference images not displayed]

FINDINGS: No fracture or dislocation of the right knee. There is moderate
tricompartmental osteophytosis and severe, bone-on-bone joint space
loss of the medial compartment. There is a small, nonspecific knee
joint effusion. Soft tissues are unremarkable.
IMPRESSION: No fracture or dislocation of the right knee. There is moderate
tricompartmental osteophytosis and severe, bone-on-bone joint space
loss of the medial compartment. There is a small, nonspecific knee
joint effusion

## 2019-11-15 ENCOUNTER — Other Ambulatory Visit (HOSPITAL_COMMUNITY): Payer: Federal, State, Local not specified - PPO

## 2019-11-19 ENCOUNTER — Encounter (HOSPITAL_COMMUNITY): Payer: Self-pay

## 2019-11-19 ENCOUNTER — Ambulatory Visit (HOSPITAL_COMMUNITY): Admit: 2019-11-19 | Payer: Federal, State, Local not specified - PPO | Admitting: Internal Medicine

## 2019-11-19 SURGERY — COLONOSCOPY
Anesthesia: Moderate Sedation

## 2019-12-24 ENCOUNTER — Ambulatory Visit: Payer: Federal, State, Local not specified - PPO | Attending: Internal Medicine

## 2019-12-24 DIAGNOSIS — Z23 Encounter for immunization: Secondary | ICD-10-CM | POA: Insufficient documentation

## 2020-01-13 NOTE — Progress Notes (Signed)
Subjective:    Patient ID: Lori [name], female    DOB: [birth year], 69 y.o.   MRN: [id number]  HPI The patient is here for an acute visit.   Left hand and arm numbness - it started > 2 months ago. It goes numb every time she lays down.  It will wake her up. To make it go away she has to sit up and let the arm hang down.  She denies any symptoms during the day.  She denies pain or weakness in the left arm/hand.  She denies neck pain.    She has intermittent sharp, bee sting like pain in her chest randomly. If she counts to 3 it is gone.  It only occurs with activity or after activity.  She had it yesterday after walking.  She was concerned the arm symptoms and chest pain were related to her heart.     Medications and allergies reviewed with patient and updated if appropriate.  Patient Active Problem List   Diagnosis Date Noted  . Arthritis of right knee 07/24/2019  . Left wrist pain 07/03/2019  . Acute pain of left shoulder 07/03/2019  . Headache 06/28/2019  . Chronic pain of right knee 05/22/2019  . Stress at home 11/14/2018  . Hyperglycemia 05/08/2018  . Vertigo 04/17/2018  . Pyelonephritis 05/02/2017  . Hyperlipidemia 04/27/2017  . Personal history of colonic polyps 06/06/2014  . Family history of colon cancer 06/06/2014  . Osteopenia 11/04/2013    Current Outpatient Medications on File Prior to Visit  Medication Sig Dispense Refill  . Ascorbic Acid (VITAMIN C) 500 MG CHEW Chew 1 tablet by mouth daily.    Marland Kitchen b complex vitamins capsule Take 1 capsule by mouth daily. (Patient taking differently: Take by mouth daily. )    . Calcium Carbonate-Vitamin D 600-400 MG-UNIT tablet Take 1 tablet by mouth daily.    . Cholecalciferol (VITAMIN D3) 50 MCG (2000 UT) TABS Take by mouth daily.    . COD LIVER OIL PO Take by mouth.    . estrogens, conjugated, (PREMARIN) 0.625 MG tablet Take 0.625 mg by mouth daily. Take daily for 21 days then do not take for 7 days.    . Ginkgo Biloba 60 MG  CAPS Take by mouth daily.    . Misc Natural Products (TART CHERRY ADVANCED) CAPS Take by mouth daily.    . Multiple Vitamins-Minerals (ONE-A-DAY WOMENS 50 PLUS PO) Take by mouth.    . pyridOXINE (VITAMIN B-6) 100 MG tablet Take 100 mg by mouth daily.    . Rhubarb (ESTROVEN COMPLETE PO) Take by mouth.    . Turmeric (QC TUMERIC COMPLEX PO) Take by mouth daily.    . vitamin B-12 (CYANOCOBALAMIN) 500 MCG tablet Take 500 mcg by mouth daily.    . Vitamin E 180 MG CAPS Take by mouth daily.     No current facility-administered medications on file prior to visit.    Past Medical History:  Diagnosis Date  . CTS (carpal tunnel syndrome)    chronic  . Depression 01/16/2013  . History of colon polyps   . Osteopenia   . PONV (postoperative nausea and vomiting)   . Tubular adenoma   . UTI (urinary tract infection)     Past Surgical History:  Procedure Laterality Date  . ABDOMINAL HYSTERECTOMY  1994   fibroids  . APPENDECTOMY    . BUNIONECTOMY Right   . BUNIONECTOMY Left 2018  . CHOLECYSTECTOMY    . COLONOSCOPY  12/09/2008  Dr.Rourk- normal rectum, polyp at the hepatic flexure bx= tubular adenoma. the remainder of the colonic mucosa and terminal ileum mucosa appeared normal.  . COLONOSCOPY  2006   Dr.Sam Atkins- colonic adenoma  . COLONOSCOPY N/A 06/25/2014   Procedure: COLONOSCOPY;  Surgeon: Daneil Dolin, MD;  Location: [facility];  Service: Endoscopy;  Laterality: N/A;  1030 DR REQUEST TIME  . HEMORRHOID SURGERY    . KNEE ARTHROSCOPY Right    torn meniscus  . ORIF TOE FRACTURE Right   . rotator Cuff surgery    . TUBAL LIGATION      Social History   Socioeconomic History  . Marital status: Widowed    Spouse name: Not on file  . Number of children: 2  . Years of education: Not on file  . Highest education level: Not on file  Occupational History  . Not on file  Tobacco Use  . Smoking status: Former Smoker    Packs/day: 1.00    Years: 10.00    Pack years: 10.00     Types: Cigarettes    Start date: 04/13/1971    Quit date: 11/01/1993    Years since quitting: 26.2  . Smokeless tobacco: Never Used  Substance and Sexual Activity  . Alcohol use: Yes    Alcohol/week: 0.0 standard drinks    Comment: wine  . Drug use: No  . Sexual activity: Yes    Birth control/protection: Surgical  Other Topics Concern  . Not on file  Social History Narrative   Exercising regularly - senior exercises, walking   Has 2 children.   Social Determinants of Health   Financial Resource Strain:   . Difficulty of Paying Living Expenses:   Food Insecurity:   . Worried About Charity fundraiser in the Last Year:   . Arboriculturist in the Last Year:   Transportation Needs:   . Film/video editor (Medical):   Marland Kitchen Lack of Transportation (Non-Medical):   Physical Activity:   . Days of Exercise per Week:   . Minutes of Exercise per Session:   Stress:   . Feeling of Stress :   Social Connections:   . Frequency of Communication with Friends and Family:   . Frequency of Social Gatherings with Friends and Family:   . Attends Religious Services:   . Active Member of Clubs or Organizations:   . Attends Archivist Meetings:   Marland Kitchen Marital Status:     Family History  Problem Relation Age of Onset  . Bipolar disorder Sister   . Schizophrenia Sister   . Kidney disease Sister   . Hypertension Sister   . Bipolar disorder Sister   . Kidney disease Sister   . Hypertension Mother   . Mental illness Mother   . Dementia Mother   . Pancreatic cancer Mother   . Cancer Father 64       colon; bladder  . Hypertension Father   . Heart disease Father   . Arthritis Father   . Prostate cancer Father   . Esophageal cancer Father   . Stomach cancer Father   . Colon cancer Father   . Kidney disease Father   . Bladder Cancer Father   . Ovarian cancer Daughter   . Bipolar disorder Son   . Liver cancer Neg Hx     Review of Systems  Constitutional: Negative for fever.    Respiratory: Negative for shortness of breath.   Cardiovascular: Positive for chest pain.  Negative for palpitations.  Musculoskeletal: Negative for neck pain and neck stiffness.  Neurological: Positive for numbness. Negative for dizziness, weakness, light-headedness and headaches.       Objective:   Vitals:   01/14/20 1420  BP: 138/76  Pulse: 78  Resp: 16  Temp: 98.2 F (36.8 C)  SpO2: 97%   BP Readings from Last 3 Encounters:  01/14/20 138/76  10/02/19 124/77  09/17/19 124/76   Wt Readings from Last 3 Encounters:  01/14/20 174 lb (78.9 kg)  10/02/19 174 lb (78.9 kg)  09/17/19 174 lb 6 oz (79.1 kg)   Body mass index is 29.87 kg/m.   Physical Exam    Constitutional: Appears well-developed and well-nourished. No distress.  Head: Normocephalic and atraumatic.  Neck: Neck supple. No tracheal deviation present. No thyromegaly present.  No cervical lymphadenopathy Cardiovascular: Normal rate, regular rhythm and normal heart sounds.  No murmur heard. No carotid bruit .  No edema Pulmonary/Chest: Effort normal and breath sounds normal. No respiratory distress. No has no wheezes. No rales.  Msk;  No tenderness with palpation of left upper arm, shoulder or neck.  No symptoms with movement of arm.  ROM normal of Left shoulder Neuro: normal sensation and strength b/l UE Skin: Skin is warm and dry. Not diaphoretic.  Psychiatric: Normal mood and affect. Behavior is normal.       Assessment & Plan:    See Problem List for Assessment and Plan of chronic medical problems.    This visit occurred during the SARS-CoV-2 public health emergency.  Safety protocols were in place, including screening questions prior to the visit, additional usage of staff PPE, and extensive cleaning of exam room while observing appropriate contact time as indicated for disinfecting solutions.

## 2020-01-14 ENCOUNTER — Encounter: Payer: Self-pay | Admitting: Internal Medicine

## 2020-01-14 ENCOUNTER — Ambulatory Visit: Payer: Federal, State, Local not specified - PPO | Admitting: Internal Medicine

## 2020-01-14 ENCOUNTER — Other Ambulatory Visit: Payer: Self-pay

## 2020-01-14 VITALS — BP 138/76 | HR 78 | Temp 98.2°F | Resp 16 | Ht 64.0 in | Wt 174.0 lb

## 2020-01-14 DIAGNOSIS — R200 Anesthesia of skin: Secondary | ICD-10-CM | POA: Diagnosis not present

## 2020-01-14 DIAGNOSIS — R0789 Other chest pain: Secondary | ICD-10-CM | POA: Diagnosis not present

## 2020-01-14 MED ORDER — GABAPENTIN 100 MG PO CAPS: 100.0000 mg | ORAL_CAPSULE | Freq: Three times a day (TID) | ORAL | 3 refills | Status: DC

## 2020-01-14 MED ORDER — ALPRAZOLAM 0.25 MG PO TABS: 0.2500 mg | ORAL_TABLET | Freq: Every day | ORAL | 0 refills | Status: DC | PRN

## 2020-01-14 NOTE — Assessment & Plan Note (Signed)
Acute It only occurs when he needs down at night-never experiences this during the day No associated weakness, neck pain or shoulder pain Has carpal tunnel on the left, which could be causing the symptoms versus cervical radiculopathy Discussed neurology referral-he has been in the past, ENT Advised he can try gabapentin at night to see if that helps Advised wearing her wrist brace nightly to see if that helps She will update me if her symptoms do not improve Advised that if not related to her chest pain or cardiac in nature

## 2020-01-14 NOTE — Patient Instructions (Addendum)
   Medications reviewed and updated.  Changes include :   Gabapentin 100-200 mg at night   Your prescription(s) have been submitted to your pharmacy. Please take as directed and contact our office if you believe you are having problem(s) with the medication(s).  A referral was ordered for cardiology   Start wearing the left wrist splint nightly

## 2020-01-14 NOTE — Assessment & Plan Note (Signed)
Acute Experiencing intermittent sharp chest pain that last several seconds It tends to occur with activity or discoloration activity No associated symptoms including shortness of breath, palpitations Pain is not consistent with activity Pain sounds atypical in nature the way she is describing it, but is not the best historian Advised this is not related to her left arm numbness Will refer to cardiology for further evaluation

## 2020-01-16 ENCOUNTER — Other Ambulatory Visit: Payer: Self-pay

## 2020-01-16 ENCOUNTER — Encounter: Payer: Self-pay | Admitting: Cardiology

## 2020-01-16 ENCOUNTER — Ambulatory Visit: Payer: Federal, State, Local not specified - PPO | Admitting: Cardiology

## 2020-01-16 VITALS — BP 130/66 | HR 75 | Resp 15 | Ht 64.5 in | Wt 174.0 lb

## 2020-01-16 DIAGNOSIS — Z79899 Other long term (current) drug therapy: Secondary | ICD-10-CM

## 2020-01-16 DIAGNOSIS — Z01812 Encounter for preprocedural laboratory examination: Secondary | ICD-10-CM

## 2020-01-16 DIAGNOSIS — R079 Chest pain, unspecified: Secondary | ICD-10-CM | POA: Diagnosis not present

## 2020-01-16 DIAGNOSIS — E785 Hyperlipidemia, unspecified: Secondary | ICD-10-CM

## 2020-01-16 DIAGNOSIS — R0789 Other chest pain: Secondary | ICD-10-CM | POA: Diagnosis not present

## 2020-01-16 MED ORDER — ROSUVASTATIN CALCIUM 5 MG PO TABS: 5.0000 mg | ORAL_TABLET | Freq: Every day | ORAL | 3 refills | Status: DC

## 2020-01-16 MED ORDER — METOPROLOL TARTRATE 100 MG PO TABS: 100.0000 mg | ORAL_TABLET | Freq: Once | ORAL | 0 refills | Status: DC

## 2020-01-16 NOTE — Progress Notes (Signed)
Cardiology Office Note:    Date:  01/16/2020   ID:  Lori [name], DOB 12/20/1950, MRN [id number]  PCP:  Lori Rail, MD  Cardiologist:  No primary care provider on file.  Electrophysiologist:  None   Referring MD: Lori Rail, MD     History of Present Illness:    Lori [name] is a 69 y.o. female here for evaluation of chest pain at the request of Dr. Billey [name].  Been having intermittent sharp be like stinging chest discomfort randomly after about 3 seconds that usually goes away.  Can occur with or without activity. Arm goes numb and hand nightly. Will holler out with activity at times. Been happening for several month.  When she has the chest discomfort sometimes it makes her yelp.  Quit tob 30 years ago after 20 years.   Personally reviewed CT of abdomen from 2018 as below.  This did demonstrate some iliac atherosclerosis present.  He says she has had some trouble with medications in the past.  Past Medical History:  Diagnosis Date  . Acute pain of left shoulder 07/03/2019  . Arthritis of right knee 07/24/2019  . Atypical chest pain 11/14/2018  . Chronic pain of right knee 05/22/2019  . CTS (carpal tunnel syndrome)    chronic  . Depression 01/16/2013  . Family history of colon cancer 06/06/2014  . Headache 06/28/2019  . History of colon polyps   . Hyperglycemia 05/08/2018  . Hyperlipidemia 04/27/2017  . Left arm numbness 11/21/2017  . Left wrist pain 07/03/2019  . Osteopenia   . Personal history of colonic polyps 06/06/2014  . PONV (postoperative nausea and vomiting)   . Pyelonephritis 05/02/2017  . Stress at home 11/14/2018   stress w/dtr who has ovarian cancer 1/20  . Tubular adenoma   . UTI (urinary tract infection)   . Vertigo 04/17/2018    Past Surgical History:  Procedure Laterality Date  . ABDOMINAL HYSTERECTOMY  1994   fibroids  . APPENDECTOMY    . BUNIONECTOMY Right   . BUNIONECTOMY Left 2018  . CHOLECYSTECTOMY    . COLONOSCOPY  12/09/2008   Dr.Rourk-  normal rectum, polyp at the hepatic flexure bx= tubular adenoma. the remainder of the colonic mucosa and terminal ileum mucosa appeared normal.  . COLONOSCOPY  2006   Dr.Sam [name]- colonic adenoma  . COLONOSCOPY N/A 06/25/2014   Procedure: COLONOSCOPY;  Surgeon: Lori Dolin, MD;  Location: [facility];  Service: Endoscopy;  Laterality: N/A;  1030 DR REQUEST TIME  . HEMORRHOID SURGERY    . KNEE ARTHROSCOPY Right    torn meniscus  . ORIF TOE FRACTURE Right   . rotator Cuff surgery    . TUBAL LIGATION      Current Medications: Current Meds  Medication Sig  . ALPRAZolam (XANAX) 0.25 MG tablet Take 1 tablet (0.25 mg total) by mouth daily as needed for anxiety.  . Ascorbic Acid (VITAMIN C) 500 MG CHEW Chew 1 tablet by mouth daily.  Marland Kitchen b complex vitamins capsule Take 1 capsule by mouth daily. (Patient taking differently: Take by mouth daily. )  . Calcium Carbonate-Vitamin D 600-400 MG-UNIT tablet Take 1 tablet by mouth daily.  . Cholecalciferol (VITAMIN D3) 50 MCG (2000 UT) TABS Take by mouth daily.  . COD LIVER OIL PO Take by mouth.  . estrogens, conjugated, (PREMARIN) 0.625 MG tablet Take 0.625 mg by mouth daily. Take daily for 21 days then do not take for 7 days.  Marland Kitchen gabapentin (  NEURONTIN) 100 MG capsule Take 1 capsule (100 mg total) by mouth 3 (three) times daily.  . Ginkgo Biloba 60 MG CAPS Take by mouth daily.  . Misc Natural Products (TART CHERRY ADVANCED) CAPS Take by mouth daily.  . Multiple Vitamins-Minerals (ONE-A-DAY WOMENS 50 PLUS PO) Take by mouth.  . pyridOXINE (VITAMIN B-6) 100 MG tablet Take 100 mg by mouth daily.  . Rhubarb (ESTROVEN COMPLETE PO) Take by mouth.  . Turmeric (QC TUMERIC COMPLEX PO) Take by mouth daily.  . vitamin B-12 (CYANOCOBALAMIN) 500 MCG tablet Take 500 mcg by mouth daily.  . Vitamin E 180 MG CAPS Take by mouth daily.     Allergies:   Codeine, Penicillins, and Tramadol   Social History   Socioeconomic History  . Marital status: Widowed     Spouse name: Not on file  . Number of children: 2  . Years of education: Not on file  . Highest education level: Not on file  Occupational History  . Not on file  Tobacco Use  . Smoking status: Former Smoker    Packs/day: 1.00    Years: 10.00    Pack years: 10.00    Types: Cigarettes    Start date: 04/13/1971    Quit date: 11/01/1993    Years since quitting: 26.2  . Smokeless tobacco: Never Used  Substance and Sexual Activity  . Alcohol use: Yes    Alcohol/week: 0.0 standard drinks    Comment: wine  . Drug use: No  . Sexual activity: Yes    Birth control/protection: Surgical  Other Topics Concern  . Not on file  Social History Narrative   Exercising regularly - senior exercises, walking   Has 2 children.   Social Determinants of Health   Financial Resource Strain:   . Difficulty of Paying Living Expenses:   Food Insecurity:   . Worried About Charity fundraiser in the Last Year:   . Arboriculturist in the Last Year:   Transportation Needs:   . Film/video editor (Medical):   Marland Kitchen Lack of Transportation (Non-Medical):   Physical Activity:   . Days of Exercise per Week:   . Minutes of Exercise per Session:   Stress:   . Feeling of Stress :   Social Connections:   . Frequency of Communication with Friends and Family:   . Frequency of Social Gatherings with Friends and Family:   . Attends Religious Services:   . Active Member of Clubs or Organizations:   . Attends Archivist Meetings:   Marland Kitchen Marital Status:      Family History: The patient's family history includes Arthritis in her father; Bipolar disorder in her sister, sister, and son; Bladder Cancer in her father; Cancer (age of onset: 30) in her father; Colon cancer in her father; Dementia in her mother; Esophageal cancer in her father; Heart disease in her father; Hypertension in her father, mother, and sister; Kidney disease in her father, sister, and sister; Mental illness in her mother; Ovarian cancer in  her daughter; Pancreatic cancer in her mother; Prostate cancer in her father; Schizophrenia in her sister; Stomach cancer in her father. There is no history of Liver cancer.  Lori:   Please see the history of present illness.    No fevers chills nausea vomiting syncope bleeding orthopnea all other systems reviewed and are negative.  EKGs/Labs/Other Studies Reviewed:    The following studies were reviewed today: Prior office notes reviewed, abdominal CT scan from 2018  reviewed personally and interpreted as iliac atherosclerosis  EKG:  EKG is  ordered today.  The ekg ordered today demonstrates sinus rhythm 64 with no other abnormalities  Recent Labs: 05/22/2019: ALT 16; BUN 16; Creatinine, Ser 0.89; Hemoglobin 14.8; Platelets 267.0; Potassium 4.2; Sodium 140; TSH 1.38  Recent Lipid Panel    Component Value Date/Time   CHOL 262 (H) 05/22/2019 0859   TRIG 96.0 05/22/2019 0859   HDL 85.30 05/22/2019 0859   CHOLHDL 3 05/22/2019 0859   VLDL 19.2 05/22/2019 0859   LDLCALC 158 (H) 05/22/2019 0859    Physical Exam:    VS:  BP 130/66   Pulse 75   Resp 15   Ht 5' 4.5" (1.638 m)   Wt 174 lb (78.9 kg)   SpO2 96%   BMI 29.41 kg/m     Wt Readings from Last 3 Encounters:  01/16/20 174 lb (78.9 kg)  01/14/20 174 lb (78.9 kg)  10/02/19 174 lb (78.9 kg)     GEN:  Well nourished, well developed in no acute distress HEENT: Normal NECK: No JVD; No carotid bruits LYMPHATICS: No lymphadenopathy CARDIAC: RRR, no murmurs, rubs, gallops RESPIRATORY:  Clear to auscultation without rales, wheezing or rhonchi  ABDOMEN: Soft, non-tender, non-distended MUSCULOSKELETAL:  No edema; No deformity  SKIN: Warm and dry NEUROLOGIC:  Alert and oriented x 3 PSYCHIATRIC:  Normal affect   ASSESSMENT:    1. Hyperlipidemia, unspecified hyperlipidemia type   2. Chest pain, unspecified type   3. Medication management   4. Atypical chest pain   5. Pre-procedure lab exam    PLAN:    In order of problems  listed above:  Atypical chest pain -Does sound fairly musculoskeletal in quality.  We gave her options of potentially monitoring this watching with further risk factor modification.  I also gave her the option of potentially proceeding with coronary CT scan.  She does have iliac atherosclerosis noted, right.  She opted to proceed with diagnostic testing.  Iliac atherosclerosis on the right side -I personally reviewed her CT of the abdomen from 2018 and showed her evidence of atherosclerosis present in the iliac system.  Because of this, LDL cholesterol of 158 previously 128, I am going to start her on Crestor 5 mg a day.  She says that medicine sometimes do not agree with her.  Hopefully she will not have any difficulties with this medication.  I think it makes sense for her given the evidence of atherosclerosis. -In 3 months we will check a lipid panel and ALT.  Mixed hyperlipidemia -Starting Crestor 5 mg.  See above for details.   Medication Adjustments/Labs and Tests Ordered: Current medicines are reviewed at length with the patient today.  Concerns regarding medicines are outlined above.  Orders Placed This Encounter  Procedures  . CT CORONARY MORPH W/CTA COR W/SCORE W/CA W/CM &/OR WO/CM  . CT CORONARY FRACTIONAL FLOW RESERVE DATA PREP  . CT CORONARY FRACTIONAL FLOW RESERVE FLUID ANALYSIS  . ALT  . Lipid panel  . Basic metabolic panel  . EKG 12-Lead   Meds ordered this encounter  Medications  . rosuvastatin (CRESTOR) 5 MG tablet    Sig: Take 1 tablet (5 mg total) by mouth daily.    Dispense:  90 tablet    Refill:  3  . metoprolol tartrate (LOPRESSOR) 100 MG tablet    Sig: Take 1 tablet (100 mg total) by mouth once for 1 dose. Take 1 tablet 2 hours before your Coronary CT scan  Dispense:  1 tablet    Refill:  0    Patient Instructions  Medication Instructions:  Start Crestor 5 mg a day. Continue all other medications as listed.  *If you need a refill on your cardiac  medications before your next appointment, please call your pharmacy*  Lab Work: You will need lab work before your CT scan (BMP) Please have blood work in 3 months ([place])  If you have labs (blood work) drawn today and your tests are completely normal, you will receive your results only by: Marland Kitchen MyChart Message (if you have MyChart) OR . A paper copy in the mail If you have any lab test that is abnormal or we need to change your treatment, we will call you to review the results.  Testing/Procedures: Your physician has requested that you have Coronary CT. Please follow instruction sheet as given.  Follow-Up: At [facility], you and your health needs are our priority.  As part of our continuing mission to provide you with exceptional heart care, we have created designated Provider Care Teams.  These Care Teams include your primary Cardiologist (physician) and Advanced Practice Providers (APPs -  Physician Assistants and Nurse Practitioners) who all work together to provide you with the care you need, when you need it.  We recommend signing up for the patient portal called "MyChart".  Sign up information is provided on this After Visit Summary.  MyChart is used to connect with patients for Virtual Visits (Telemedicine).  Patients are able to view lab/test results, encounter notes, upcoming appointments, etc.  Non-urgent messages can be sent to your provider as well.   To learn more about what you can do with MyChart, go to [email or web address].    Your next appointment:   3 month(s)  The format for your next appointment:   In Person  Provider:   Candee Furbish, MD   Thank you for choosing [facility]!!    Your cardiac CT will be scheduled at:  [facility] [address] [place], [place] [address] [phone]  Please arrive at the [facility] main entrance of [facility] 30 minutes prior to test start time. Proceed to the [facility] Radiology  Department (first floor) to check-in and test prep.  Please follow these instructions carefully (unless otherwise directed):  On the Night Before the Test: . Be sure to Drink plenty of water. . Do not consume any caffeinated/decaffeinated beverages or chocolate 12 hours prior to your test. . Do not take any antihistamines 12 hours prior to your test. . If you take Metformin do not take 24 hours prior to test.  On the Day of the Test: . Drink plenty of water. Do not drink any water within one hour of the test. . Do not eat any food 4 hours prior to the test. . You may take your regular medications prior to the test.  . Take metoprolol (Lopressor) 100 mg two hours prior to test. . HOLD Furosemide/Hydrochlorothiazide morning of the test. . FEMALES- please wear underwire-free bra if available      After the Test: . Drink plenty of water. . After receiving IV contrast, you may experience a mild flushed feeling. This is normal. . On occasion, you may experience a mild rash up to 24 hours after the test. This is not dangerous. If this occurs, you can take Benadryl 25 mg and increase your fluid intake. . If you experience trouble breathing, this can be serious. If it is  severe call 911 IMMEDIATELY. If it is mild, please call our office. . If you take any of these medications: Glipizide/Metformin, Avandament, Glucavance, please do not take 48 hours after completing test unless otherwise instructed.   Once we have confirmed authorization from your insurance company, we will call you to set up a date and time for your test.   For non-scheduling related questions, please contact the cardiac imaging nurse navigator should you have any questions/concerns: Marchia Bond, RN Navigator Cardiac Imaging [facility] [phone] office  For scheduling needs, including cancellations and rescheduling, please call [phone].       Signed, Candee Furbish, MD  01/16/2020 1:12  PM    [facility]

## 2020-01-16 NOTE — Patient Instructions (Addendum)
Medication Instructions:  Start Crestor 5 mg a day. Continue all other medications as listed.  *If you need a refill on your cardiac medications before your next appointment, please call your pharmacy*  Lab Work: You will need lab work before your CT scan (BMP) Please have blood work in 3 months ([place])  If you have labs (blood work) drawn today and your tests are completely normal, you will receive your results only by: Marland Kitchen MyChart Message (if you have MyChart) OR . A paper copy in the mail If you have any lab test that is abnormal or we need to change your treatment, we will call you to review the results.  Testing/Procedures: Your physician has requested that you have Coronary CT. Please follow instruction sheet as given.  Follow-Up: At [facility], you and your health needs are our priority.  As part of our continuing mission to provide you with exceptional heart care, we have created designated Provider Care Teams.  These Care Teams include your primary Cardiologist (physician) and Advanced Practice Providers (APPs -  Physician Assistants and Nurse Practitioners) who all work together to provide you with the care you need, when you need it.  We recommend signing up for the patient portal called "MyChart".  Sign up information is provided on this After Visit Summary.  MyChart is used to connect with patients for Virtual Visits (Telemedicine).  Patients are able to view lab/test results, encounter notes, upcoming appointments, etc.  Non-urgent messages can be sent to your provider as well.   To learn more about what you can do with MyChart, go to [email or web address].    Your next appointment:   3 month(s)  The format for your next appointment:   In Person  Provider:   Candee Furbish, MD   Thank you for choosing [facility]!!    Your cardiac CT will be scheduled at:  [facility] [address] [place], [place] [address] [phone]  Please  arrive at the [facility] main entrance of [facility] 30 minutes prior to test start time. Proceed to the [facility] Radiology Department (first floor) to check-in and test prep.  Please follow these instructions carefully (unless otherwise directed):  On the Night Before the Test: . Be sure to Drink plenty of water. . Do not consume any caffeinated/decaffeinated beverages or chocolate 12 hours prior to your test. . Do not take any antihistamines 12 hours prior to your test. . If you take Metformin do not take 24 hours prior to test.  On the Day of the Test: . Drink plenty of water. Do not drink any water within one hour of the test. . Do not eat any food 4 hours prior to the test. . You may take your regular medications prior to the test.  . Take metoprolol (Lopressor) 100 mg two hours prior to test. . HOLD Furosemide/Hydrochlorothiazide morning of the test. . FEMALES- please wear underwire-free bra if available      After the Test: . Drink plenty of water. . After receiving IV contrast, you may experience a mild flushed feeling. This is normal. . On occasion, you may experience a mild rash up to 24 hours after the test. This is not dangerous. If this occurs, you can take Benadryl 25 mg and increase your fluid intake. . If you experience trouble breathing, this can be serious. If it is severe call 911 IMMEDIATELY. If it is mild, please call our office. . If you take any  of these medications: Glipizide/Metformin, Avandament, Glucavance, please do not take 48 hours after completing test unless otherwise instructed.   Once we have confirmed authorization from your insurance company, we will call you to set up a date and time for your test.   For non-scheduling related questions, please contact the cardiac imaging nurse navigator should you have any questions/concerns: Marchia Bond, RN Navigator Cardiac Imaging [facility] [phone] office  For  scheduling needs, including cancellations and rescheduling, please call [phone].

## 2020-01-21 ENCOUNTER — Ambulatory Visit: Payer: Federal, State, Local not specified - PPO | Attending: Internal Medicine

## 2020-01-21 DIAGNOSIS — Z23 Encounter for immunization: Secondary | ICD-10-CM

## 2020-01-21 NOTE — Progress Notes (Signed)
   Covid-19 Vaccination Clinic  Name:  Lori [name]    MRN: [id number] DOB: [birth year]  01/21/2020  [name] was observed post Covid-19 immunization for 15 minutes without incident. She was provided with Vaccine Information Sheet and instruction to access the V-Safe system.   [name] was instructed to call 911 with any severe reactions post vaccine: Marland Kitchen Difficulty breathing  . Swelling of face and throat  . A fast heartbeat  . A bad rash all over body  . Dizziness and weakness   Immunizations Administered    Name Date Dose VIS Date Route   Moderna COVID-19 Vaccine 01/21/2020  9:42 AM 0.5 mL 09/24/2019 Intramuscular   Manufacturer: Moderna   Lot: [id number]   [id number]

## 2020-01-30 ENCOUNTER — Other Ambulatory Visit: Payer: Federal, State, Local not specified - PPO | Admitting: *Deleted

## 2020-01-30 ENCOUNTER — Other Ambulatory Visit: Payer: Self-pay

## 2020-01-30 DIAGNOSIS — R0789 Other chest pain: Secondary | ICD-10-CM

## 2020-01-30 DIAGNOSIS — Z01812 Encounter for preprocedural laboratory examination: Secondary | ICD-10-CM

## 2020-01-30 LAB — BASIC METABOLIC PANEL
BUN/Creatinine Ratio: 17 (ref 12–28)
BUN: 14 mg/dL (ref 8–27)
CO2: 25 mmol/L (ref 20–29)
Calcium: 9.1 mg/dL (ref 8.7–10.3)
Chloride: 104 mmol/L (ref 96–106)
Creatinine, Ser: 0.81 mg/dL (ref 0.57–1.00)
GFR calc Af Amer: 88 mL/min/{1.73_m2} (ref >59)
GFR calc non Af Amer: 76 mL/min/{1.73_m2} (ref >59)
Glucose: 102 mg/dL — ABNORMAL HIGH (ref 65–99)
Potassium: 4.7 mmol/L (ref 3.5–5.2)
Sodium: 142 mmol/L (ref 134–144)

## 2020-02-12 ENCOUNTER — Telehealth (HOSPITAL_COMMUNITY): Payer: Self-pay | Admitting: Emergency Medicine

## 2020-02-12 NOTE — Telephone Encounter (Signed)
Reaching out to patient to offer assistance regarding upcoming cardiac imaging study; pt verbalizes understanding of appt date/time, parking situation and where to check in, pre-test NPO status and medications ordered, and verified current allergies; name and call back number provided for further questions should they arise Areya Lemmerman RN Navigator Cardiac Imaging [facility] [phone] office [phone] cell 

## 2020-02-13 ENCOUNTER — Encounter (HOSPITAL_COMMUNITY): Payer: Self-pay

## 2020-02-13 ENCOUNTER — Other Ambulatory Visit: Payer: Self-pay

## 2020-02-13 ENCOUNTER — Ambulatory Visit (HOSPITAL_COMMUNITY)
Admission: RE | Admit: 2020-02-13 | Discharge: 2020-02-13 | Disposition: A | Discharge status: Home / Self Care | Payer: Federal, State, Local not specified - PPO | Source: Ambulatory Visit | Attending: Cardiology | Admitting: Cardiology

## 2020-02-13 DIAGNOSIS — R0789 Other chest pain: Secondary | ICD-10-CM | POA: Diagnosis not present

## 2020-02-13 MED ORDER — IOHEXOL 350 MG/ML SOLN
80.0000 mL | Freq: Once | INTRAVENOUS | Status: AC | PRN
  Administered 2020-02-13: 80 mL via INTRAVENOUS

## 2020-02-13 MED ORDER — NITROGLYCERIN 0.4 MG SL SUBL
0.8000 mg | SUBLINGUAL_TABLET | Freq: Once | SUBLINGUAL | Status: AC
  Administered 2020-02-13: 0.8 mg via SUBLINGUAL

## 2020-02-13 MED ORDER — NITROGLYCERIN 0.4 MG SL SUBL
SUBLINGUAL_TABLET | SUBLINGUAL | Status: AC
  Filled 2020-02-13: qty 2

## 2020-02-13 NOTE — Discharge Instructions (Signed)
Cardiac CT Angiogram A cardiac CT angiogram is a procedure to look at the heart and the area around the heart. It may be done to help find the cause of chest pains or other symptoms of heart disease. During this procedure, a substance called contrast dye is injected into the blood vessels in the area to be checked. A large X-ray machine, called a CT scanner, then takes detailed pictures of the heart and the surrounding area. The procedure is also sometimes called a coronary CT angiogram, coronary artery scanning, or CTA. A cardiac CT angiogram allows the health care provider to see how well blood is flowing to and from the heart. The health care provider will be able to see if there are any problems, such as:  Blockage or narrowing of the coronary arteries in the heart.  Fluid around the heart.  Signs of weakness or disease in the muscles, valves, and tissues of the heart. Tell a health care provider about:  Any allergies you have. This is especially important if you have had a previous allergic reaction to contrast dye.  All medicines you are taking, including vitamins, herbs, eye drops, creams, and over-the-counter medicines.  Any blood disorders you have.  Any surgeries you have had.  Any medical conditions you have.  Whether you are pregnant or may be pregnant.  Any anxiety disorders, chronic pain, or other conditions you have that may increase your stress or prevent you from lying still. What are the risks? Generally, this is a safe procedure. However, problems may occur, including:  Bleeding.  Infection.  Allergic reactions to medicines or dyes.  Damage to other structures or organs.  Kidney damage from the contrast dye that is used.  Increased risk of cancer from radiation exposure. This risk is low. Talk with your health care provider about: ? The risks and benefits of testing. ? How you can receive the lowest dose of radiation. What happens before the  procedure?  Wear comfortable clothing and remove any jewelry, glasses, dentures, and hearing aids.  Follow instructions from your health care provider about eating and drinking. This may include: ? For 12 hours before the procedure -- avoid caffeine. This includes tea, coffee, soda, energy drinks, and diet pills. Drink plenty of water or other fluids that do not have caffeine in them. Being well hydrated can prevent complications. ? For 4-6 hours before the procedure -- stop eating and drinking. The contrast dye can cause nausea, but this is less likely if your stomach is empty.  Ask your health care provider about changing or stopping your regular medicines. This is especially important if you are taking diabetes medicines, blood thinners, or medicines to treat problems with erections (erectile dysfunction). What happens during the procedure?   Hair on your chest may need to be removed so that small sticky patches called electrodes can be placed on your chest. These will transmit information that helps to monitor your heart during the procedure.  An IV will be inserted into one of your veins.  You might be given a medicine to control your heart rate during the procedure. This will help to ensure that good images are obtained.  You will be asked to lie on an exam table. This table will slide in and out of the CT machine during the procedure.  Contrast dye will be injected into the IV. You might feel warm, or you may get a metallic taste in your mouth.  You will be given a medicine called   nitroglycerin. This will relax or dilate the arteries in your heart.  The table that you are lying on will move into the CT machine tunnel for the scan.  The person running the machine will give you instructions while the scans are being done. You may be asked to: ? Keep your arms above your head. ? Hold your breath. ? Stay very still, even if the table is moving.  When the scanning is complete, you  will be moved out of the machine.  The IV will be removed. The procedure may vary among health care providers and hospitals. What can I expect after the procedure? After your procedure, it is common to have:  A metallic taste in your mouth from the contrast dye.  A feeling of warmth.  A headache from the nitroglycerin. Follow these instructions at home:  Take over-the-counter and prescription medicines only as told by your health care provider.  If you are told, drink enough fluid to keep your urine pale yellow. This will help to flush the contrast dye out of your body.  Most people can return to their normal activities right after the procedure. Ask your health care provider what activities are safe for you.  It is up to you to get the results of your procedure. Ask your health care provider, or the department that is doing the procedure, when your results will be ready.  Keep all follow-up visits as told by your health care provider. This is important. Contact a health care provider if:  You have any symptoms of allergy to the contrast dye. These include: ? Shortness of breath. ? Rash or hives. ? A racing heartbeat. Summary  A cardiac CT angiogram is a procedure to look at the heart and the area around the heart. It may be done to help find the cause of chest pains or other symptoms of heart disease.  During this procedure, a large X-ray machine, called a CT scanner, takes detailed pictures of the heart and the surrounding area after a contrast dye has been injected into blood vessels in the area.  Ask your health care provider about changing or stopping your regular medicines before the procedure. This is especially important if you are taking diabetes medicines, blood thinners, or medicines to treat erectile dysfunction.  If you are told, drink enough fluid to keep your urine pale yellow. This will help to flush the contrast dye out of your body. This information is not  intended to replace advice given to you by your health care provider. Make sure you discuss any questions you have with your health care provider. Document Revised: 06/05/2019 Document Reviewed: 06/05/2019 Elsevier Patient Education  2020 Elsevier Inc[employer] Testing With IV Contrast Material IV contrast material is a fluid that is used with some imaging tests. It is injected into your body through a vein. Contrast material is used when your health care providers need a detailed look at organs, tissues, or blood vessels that may not show up with the standard test. The material may be used when an X-ray, an MRI, a CT scan, or an ultrasound is done. IV contrast material may be used for imaging tests that check:  Muscles, skin, and fat.  Breasts.  Brain.  Digestive tract.  Heart.  Organs such as the liver, kidneys, lungs, bladder, and many others.  Arteries and veins. Tell a health care provider about:  Any allergies you have, especially an allergy to contrast material.  All medicines you are taking, including   metformin, beta blockers, NSAIDs (such as ibuprofen), interleukin-2, vitamins, herbs, eye drops, creams, and over-the-counter medicines.  Any problems you or family members have had with the use of contrast material.  Any blood disorders you have, such as sickle cell anemia.  Any surgeries you have had.  Any medical conditions you have or have had, especially alcohol abuse, dehydration, asthma, or kidney, liver, or heart problems.  Whether you are pregnant or may be pregnant.  Whether you are breastfeeding. Most contrast materials are safe for use in breastfeeding women. What are the risks? Generally, this is a safe procedure. However, problems may occur, including:  Headache.  Itching, skin rash, and hives.  Nausea and vomiting.  Allergic reactions.  Wheezing or difficulty breathing.  Abnormal heart rate.  Changes in blood pressure.  Throat swelling.  Kidney  damage. What happens before the procedure? Medicines Ask your health care provider about:  Changing or stopping your regular medicines. This is especially important if you are taking diabetes medicines or blood thinners.  Taking medicines such as aspirin and ibuprofen. These medicines can thin your blood. Do not take these medicines unless your health care provider tells you to take them.  Taking over-the-counter medicines, vitamins, herbs, and supplements. If you are at risk of having a reaction to the IV contrast material, you may be asked to take medicine before the procedure to prevent a reaction. General instructions  Follow instructions from your health care provider about eating or drinking restrictions.  You may have an exam or lab tests to make sure that you can safely get IV contrast material.  Ask if you will be given a medicine to help you relax (sedative) during the procedure. If so, plan to have someone take you home from the hospital or clinic. What happens during the procedure?  You may be given a sedative to help you relax.  An IV will be inserted into one of your veins.  Contrast material will be injected into your IV.  You may feel warmth or flushing as the contrast material enters your bloodstream.  You may have a metallic taste in your mouth for a few minutes.  The needle may cause some discomfort and bruising.  After the contrast material is in your body, the imaging test will be done. The procedure may vary among health care providers and hospitals. What can I expect after the procedure?  The IV will be removed.  You may be taken to a recovery area if sedation medicines were used. Your blood pressure, heart rate, breathing rate, and blood oxygen level will be monitored until you leave the hospital or clinic. Follow these instructions at home:   Take over-the-counter and prescription medicines only as told by your health care provider. ? Your health  care provider may tell you to not take certain medicines for a couple of days after the procedure. This is especially important if you are taking diabetes medicines.  If you are told, drink enough fluid to keep your urine pale yellow. This will help to remove the contrast material out of your body.  Do not drive for 24 hours if you were given a sedative during your procedure.  It is up to you to get the results of your procedure. Ask your health care provider, or the department that is doing the procedure, when your results will be ready.  Keep all follow-up visits as told by your health care provider. This is important. Contact a health care provider if:    You have redness, swelling, or pain near your IV site. Get help right away if:  You have an abnormal heart rhythm.  You have trouble breathing.  You have: ? Chest pain. ? Pain in your back, neck, arm, jaw, or stomach. ? Nausea or sweating. ? Hives or a rash.  You start shaking and cannot stop. These symptoms may represent a serious problem that is an emergency. Do not wait to see if the symptoms will go away. Get medical help right away. Call your local emergency services (911 in the [place] Do not drive yourself to the hospital. Summary  IV contrast material may be used for imaging tests to help your health care providers see your organs and tissues more clearly.  Tell your health care provider if you are pregnant or may be pregnant.  During the procedure, you may feel warmth or flushing as the contrast material enters your bloodstream.  After the procedure, drink enough fluid to keep your urine pale yellow. This information is not intended to replace advice given to you by your health care provider. Make sure you discuss any questions you have with your health care provider. Document Revised: 12/27/2018 Document Reviewed: 12/27/2018 Elsevier Patient Education  2020 Elsevier Inc.  

## 2020-03-20 ENCOUNTER — Other Ambulatory Visit: Payer: Federal, State, Local not specified - PPO | Admitting: *Deleted

## 2020-03-20 ENCOUNTER — Other Ambulatory Visit: Payer: Self-pay

## 2020-03-20 DIAGNOSIS — Z79899 Other long term (current) drug therapy: Secondary | ICD-10-CM

## 2020-03-20 DIAGNOSIS — E785 Hyperlipidemia, unspecified: Secondary | ICD-10-CM

## 2020-03-20 LAB — LIPID PANEL
Chol/HDL Ratio: 2.5 ratio (ref 0.0–4.4)
Cholesterol, Total: 202 mg/dL — ABNORMAL HIGH (ref 100–199)
HDL: 82 mg/dL (ref >39)
LDL Chol Calc (NIH): 105 mg/dL — ABNORMAL HIGH (ref 0–99)
Triglycerides: 83 mg/dL (ref 0–149)
VLDL Cholesterol Cal: 15 mg/dL (ref 5–40)

## 2020-03-20 LAB — ALT: ALT: 16 IU/L (ref 0–32)

## 2020-04-02 ENCOUNTER — Encounter: Payer: Self-pay | Admitting: Family Medicine

## 2020-04-02 ENCOUNTER — Ambulatory Visit: Payer: Federal, State, Local not specified - PPO | Admitting: Family Medicine

## 2020-04-02 ENCOUNTER — Ambulatory Visit: Payer: Self-pay

## 2020-04-02 ENCOUNTER — Ambulatory Visit (INDEPENDENT_AMBULATORY_CARE_PROVIDER_SITE_OTHER): Payer: Federal, State, Local not specified - PPO

## 2020-04-02 ENCOUNTER — Other Ambulatory Visit: Payer: Self-pay

## 2020-04-02 ENCOUNTER — Ambulatory Visit: Payer: Federal, State, Local not specified - PPO | Admitting: Cardiology

## 2020-04-02 VITALS — BP 122/62 | HR 87 | Ht 64.5 in | Wt 174.0 lb

## 2020-04-02 DIAGNOSIS — M25562 Pain in left knee: Secondary | ICD-10-CM | POA: Diagnosis not present

## 2020-04-02 NOTE — Progress Notes (Signed)
Rito Ehrlich, am serving as a Education administrator for [name].  Lori [name] is a 69 y.o. female who presents to [place] at [facility] today for L knee pain. Patient sees Dr. Tamala Julian and was last seen by him on 07/24/2019 for L shoulder, neck and R knee pain. Patient participated in senior games in may where she ran the 50 yard dash, 100 yard dash, and 191meter fast walk. Then drove to [facility]. patient states knee was tight since then, but yesterday L knee popped and has been limping. Locates pain to back of knee describes the pain as sharp shooting feels better when leg is extended.  Radiates: no  Swelling: yes  aggravating symptoms: doing anything  Mechanical: yes  Tried: tylenol advil   Pertinent review of systems: No fevers or chills  Relevant historical information: Osteopenia   Exam:  BP 122/62 (BP Location: Left Arm, Patient Position: Sitting, Cuff Size: Normal)   Pulse 87   Ht 5' 4.5" (1.638 m)   Wt 174 lb (78.9 kg)   SpO2 98%   BMI 29.41 kg/m  General: Well Developed, well nourished, and in no acute distress.   MSK: Left knee mild effusion otherwise normal-appearing Range of motion 0-100 degrees. Nontender. Stable ligamentous exam. Positive medial McMurray's test negative lateral. Intact strength.     Lab and Radiology Results X-ray images left knee obtained today personally and independently reviewed. Moderate medial compartment and patellofemoral DJD. Normal lateral. No acute fractures. Await formal radiology review.  Diagnostic Limited MSK Ultrasound of: Left knee Quad tendon normal-appearing intact. Moderate effusion superior patellar space. Patellar tendon normal-appearing Medial joint line narrowed with absent or degenerative appearing medial meniscus. Lateral joint line also moderately narrowed with abnormal appearing lateral meniscus. Posterior knee reveals a tiny Baker's cyst. Impression: DJD effusion and tiny Baker's  cyst  Procedure: Real-time Ultrasound Guided Injection of left knee superior lateral patellar space Device: Philips Affiniti 50G Images permanently stored and available for review in the ultrasound unit. Verbal informed consent obtained.  Discussed risks and benefits of procedure. Warned about infection bleeding damage to structures skin hypopigmentation and fat atrophy among others. Patient expresses understanding and agreement Time-out conducted.   Noted no overlying erythema, induration, or other signs of local infection.   Skin prepped in a sterile fashion.   Local anesthesia: Topical Ethyl chloride.   With sterile technique and under real time ultrasound guidance:  40 mg of Kenalog and 2 mL of Marcaine injected easily.   Completed without difficulty   Pain immediately resolved suggesting accurate placement of the medication.   Advised to call if fevers/chills, erythema, induration, drainage, or persistent bleeding.   Images permanently stored and available for review in the ultrasound unit.  Impression: Technically successful ultrasound guided injection.        Assessment and Plan: 69 y.o. female with left knee pain.  Pain started about a month ago after participating in the senior games however patient had dramatically worsening pain yesterday after experiencing a pop.  I am concerned she may have suffered a meniscus tear.  Plan for x-ray and ultrasound today along with injection.  If not improving pretty rapidly may consider MRI.   PDMP not reviewed this encounter. Orders Placed This Encounter  Procedures  . [place] LIMITED JOINT SPACE STRUCTURES LOW LEFT    Standing Status:   Future    Number of Occurrences:   1    Standing Expiration Date:   04/02/2021  Order Specific Question:   Reason for Exam (SYMPTOM  OR DIAGNOSIS REQUIRED)    Answer:   Left knee pain    Order Specific Question:   Preferred imaging location?    Answer:   [place]  . DG Knee  AP/LAT W/[place] Left    Standing Status:   Future    Number of Occurrences:   1    Standing Expiration Date:   04/02/2021    Order Specific Question:   Reason for Exam (SYMPTOM  OR DIAGNOSIS REQUIRED)    Answer:   left knee pain    Order Specific Question:   Preferred imaging location?    Answer:   [facility]    Order Specific Question:   Radiology Contrast Protocol - do NOT remove file path    Answer:   [email or web address]   No orders of the defined types were placed in this encounter.    Discussed warning signs or symptoms. Please see discharge instructions. Patient expresses understanding.   The above documentation has been reviewed and is accurate and complete [name], M.D.

## 2020-04-02 NOTE — Patient Instructions (Signed)
Thank you for coming in today. Call or go to the ER if you develop a large red swollen joint with extreme pain or oozing puss.  Get xray today.  Recheck if not better.

## 2020-04-03 NOTE — Progress Notes (Signed)
X-ray left knee shows mild arthritis.

## 2020-04-06 ENCOUNTER — Ambulatory Visit: Payer: Federal, State, Local not specified - PPO | Admitting: Family Medicine

## 2020-04-07 ENCOUNTER — Telehealth: Payer: Self-pay | Admitting: Family Medicine

## 2020-04-07 NOTE — Telephone Encounter (Signed)
error 

## 2020-04-09 ENCOUNTER — Ambulatory Visit: Payer: Federal, State, Local not specified - PPO | Admitting: Family Medicine

## 2020-04-09 ENCOUNTER — Encounter: Payer: Self-pay | Admitting: Family Medicine

## 2020-04-09 ENCOUNTER — Other Ambulatory Visit: Payer: Self-pay

## 2020-04-09 VITALS — BP 130/78 | HR 71 | Ht 64.5 in | Wt 172.4 lb

## 2020-04-09 DIAGNOSIS — M25562 Pain in left knee: Secondary | ICD-10-CM

## 2020-04-09 NOTE — Patient Instructions (Addendum)
Thank you for coming in today. The next step is not improving or if worsening is MRI.  Let me know if it is going well. I will arrange for an MRI.   It will likely be done at [facility].    Keep me updated.

## 2020-04-09 NOTE — Progress Notes (Signed)
   I, [name], LAT, ATC, am serving as scribe for [name].  Lori [name] is a 69 y.o. female who presents to [place] at [facility] today for f/u of L post knee pain after competing in the [employer] on Feb 29, 2020 running the 50 yd and 100 yd dash and competing in the 150 m fast walk.  She was last seen by [name] on 04/02/20 and had a L knee injection.  Since her last visit, pt reports that her L knee is feeling better and is having less pain w/ weight bearing.  However, she states that her L knee is not as much improved as it was for the first 1-2 days after getting her injection.  She had a flare up of her knee pain over last weekend and rested for the majority of the weekend and into the beginning of this week.  Diagnostic imaging: L knee XR- 04/02/20   Pertinent review of systems: No fevers or chills  Relevant historical information: Osteopenia   Exam:  BP 130/78 (BP Location: Right Arm, Patient Position: Sitting, Cuff Size: Normal)   Pulse 71   Ht 5' 4.5" (1.638 m)   Wt 172 lb 6.4 oz (78.2 kg)   SpO2 99%   BMI 29.14 kg/m  General: Well Developed, well nourished, and in no acute distress.   MSK: Left knee small effusion. Normal motion. Mildly tender medial joint line. Normal gait.    Lab and Radiology Results  EXAM: LEFT KNEE 3 VIEWS  COMPARISON:  None.  FINDINGS: No fracture or dislocation of the left knee. There is mild patellofemoral compartment arthrosis with otherwise preserved joint spaces. Moderate, nonspecific knee joint effusion containing a small air loculation, of uncertain significance. Soft tissues are unremarkable.  IMPRESSION: 1.  No fracture or dislocation of the left knee.  2. Moderate nonspecific knee joint effusion containing a small air loculation of uncertain significance. Correlate for procedure such as arthrocentesis.   Electronically Signed   By: Eddie Candle M.D.   On: 04/02/2020 16:47 I,  [name], personally (independently) visualized and performed the interpretation of the images attached in this note.     Assessment and Plan: 69 y.o. female with left knee pain.  Had a knee injection about a week ago and had initial pain that is now improving.  She still more symptomatic than I would like.  We discussed potential next steps.  If improving after few weeks no need for further intervention however if not improving or if worsening next step would be MRI.  Patient will keep me updated with how she is feeling and will proceed with MRI in the future if needed.     Discussed warning signs or symptoms. Please see discharge instructions. Patient expresses understanding.   The above documentation has been reviewed and is accurate and complete [name], M.D.

## 2020-04-23 NOTE — Progress Notes (Signed)
Cardiology Office Note   Date:  05/04/2020   ID:  Lori [name], DOB 01-02-1951, MRN [id number]  PCP:  Lori Rail, MD  Cardiologist:  Dr. Marlou Porch, MD   Chief Complaint  Patient presents with   Follow-up   History of Present Illness: Lori [name] is a 69 y.o. female who presents for chest pain follow up, seen for Lori [name].  Lori [name] has a hx of remote tobacco use and HLD who was referred to Lori [name] 01/16/20 from Lori [name] for the evaluation of chest pain.   She had been having intermittent sharp chest pain lasting 3 seconds in duration. She had arm and hand involvement however this would typically occur with sleeping.   Lori [name] discussed either watching her symptoms versus proceeding with a CCTA due to iliac atherosclerosis noted on prior CT  She opted to proceed with diagnostic testing.  Study performed 4/22/21which showed a coronary calcium score of 0. Normal coronary origin with right dominance and no evidence of CAD with mild aortic atherosclerosis.  She presents today for follow up. She reports no further chest discomfort since last seen. She feels prior symptoms were in the setting of muscle tension/pulled muscle. Denies chest pain, SOB, palpitations, dizziness or syncope. Sees PCP next week for annual exam. Will re-draw Lipid panel today Tolerating Crestor with no s/e.   Past Medical History:  Diagnosis Date   Acute pain of left shoulder 07/03/2019   Arthritis of right knee 07/24/2019   Atypical chest pain 11/14/2018   Chronic pain of right knee 05/22/2019   CTS (carpal tunnel syndrome)    chronic   Depression 01/16/2013   Family history of colon cancer 06/06/2014   Headache 06/28/2019   History of colon polyps    Hyperglycemia 05/08/2018   Hyperlipidemia 04/27/2017   Left arm numbness 11/21/2017   Left wrist pain 07/03/2019   Osteopenia    Personal history of colonic polyps 06/06/2014   PONV (postoperative nausea and vomiting)    Pyelonephritis  05/02/2017   Stress at home 11/14/2018   stress w/dtr who has ovarian cancer 1/20   Tubular adenoma    UTI (urinary tract infection)    Vertigo 04/17/2018    Past Surgical History:  Procedure Laterality Date   ABDOMINAL HYSTERECTOMY  1994   fibroids   APPENDECTOMY     BUNIONECTOMY Right    BUNIONECTOMY Left 2018   CHOLECYSTECTOMY     COLONOSCOPY  12/09/2008   [facility]- normal rectum, polyp at the hepatic flexure bx= tubular adenoma. the remainder of the colonic mucosa and terminal ileum mucosa appeared normal.   COLONOSCOPY  2006   [facility] [place]- colonic adenoma   COLONOSCOPY N/A 06/25/2014   Procedure: COLONOSCOPY;  Surgeon: Lori Dolin, MD;  Location: [facility];  Service: Endoscopy;  Laterality: N/A;  1030 DR REQUEST TIME   HEMORRHOID SURGERY     KNEE ARTHROSCOPY Right    torn meniscus   ORIF TOE FRACTURE Right    rotator Cuff surgery     TUBAL LIGATION       Current Outpatient Medications  Medication Sig Dispense Refill   ALPRAZolam (XANAX) 0.25 MG tablet Take 1 tablet (0.25 mg total) by mouth daily as needed for anxiety. 20 tablet 0   Ascorbic Acid (VITAMIN C) 500 MG CHEW Chew 1 tablet by mouth daily.     b complex vitamins capsule Take 1 capsule by mouth daily.     Calcium Carbonate-Vitamin D  600-400 MG-UNIT tablet Take 1 tablet by mouth daily.     Cholecalciferol (VITAMIN D3) 50 MCG (2000 UT) TABS Take 2,000 Units by mouth daily.      COD LIVER OIL PO Take 1 capsule by mouth daily.      estrogens, conjugated, (PREMARIN) 0.625 MG tablet Take 0.625 mg by mouth daily. Take daily for 21 days then do not take for 7 days.     Ginkgo Biloba 60 MG CAPS Take 60 mg by mouth daily.      HYDROcodone-homatropine (HYCODAN) 5-1.5 MG/5ML syrup Take 5 mLs by mouth every 8 (eight) hours as needed for cough. 120 mL 0   Misc Natural Products (TART CHERRY ADVANCED) CAPS Take 1 capsule by mouth daily.      Multiple Vitamins-Minerals (ONE-A-DAY WOMENS 50 PLUS  PO) Take 1 tablet by mouth.      pyridOXINE (VITAMIN B-6) 100 MG tablet Take 100 mg by mouth daily.     Rhubarb (ESTROVEN COMPLETE PO) Take 1 tablet by mouth daily.      rosuvastatin (CRESTOR) 5 MG tablet Take 1 tablet (5 mg total) by mouth daily. 90 tablet 3   Turmeric (QC TUMERIC COMPLEX PO) Take 1 tablet by mouth daily.      vitamin B-12 (CYANOCOBALAMIN) 500 MCG tablet Take 500 mcg by mouth daily.     Vitamin E 180 MG CAPS Take 180 mg by mouth daily.      No current facility-administered medications for this visit.    Allergies:   Codeine, Penicillins, and Tramadol    Social History:  The patient  reports that she quit smoking about 26 years ago. Her smoking use included cigarettes. She started smoking about 49 years ago. She has a 10.00 pack-year smoking history. She has never used smokeless tobacco. She reports current alcohol use. She reports that she does not use drugs.   Family History:  The patient's family history includes Arthritis in her father; Bipolar disorder in her sister, sister, and son; Bladder Cancer in her father; Cancer (age of onset: 85) in her father; Colon cancer in her father; Dementia in her mother; Esophageal cancer in her father; Heart disease in her father; Hypertension in her father, mother, and sister; Kidney disease in her father, sister, and sister; Mental illness in her mother; Ovarian cancer in her daughter; Pancreatic cancer in her mother; Prostate cancer in her father; Schizophrenia in her sister; Stomach cancer in her father.   ROS:  Please see the history of present illness.   Otherwise, review of systems are positive for none.   All other systems are reviewed and negative.    PHYSICAL EXAM: VS:  BP (!) 102/58    Pulse 77    Ht 5\' 4"  (1.626 m)    Wt 174 lb 6.4 oz (79.1 kg)    SpO2 98%    BMI 29.94 kg/m  , BMI Body mass index is 29.94 kg/m.   General: Well developed, well nourished, NAD Neck: Negative for carotid bruits. No JVD Lungs:Clear to  ausculation bilaterally. Breathing is unlabored. Cardiovascular: RRR with S1 S2. No murmurs Extremities: No edema. Neuro: Alert and oriented. No focal deficits. No facial asymmetry. MAE spontaneously. Psych: Responds to questions appropriately with normal affect.    EKG:  EKG is not ordered today.  Recent Labs: 05/22/2019: Hemoglobin 14.8; Platelets 267.0; TSH 1.38 01/30/2020: BUN 14; Creatinine, Ser 0.81; Potassium 4.7; Sodium 142 03/20/2020: ALT 16    Lipid Panel    Component Value Date/Time  CHOL 202 (H) 03/20/2020 0724   TRIG 83 03/20/2020 0724   HDL 82 03/20/2020 0724   CHOLHDL 2.5 03/20/2020 0724   CHOLHDL 3 05/22/2019 0859   VLDL 19.2 05/22/2019 0859   LDLCALC 105 (H) 03/20/2020 0724     Wt Readings from Last 3 Encounters:  05/04/20 174 lb 6.4 oz (79.1 kg)  04/09/20 172 lb 6.4 oz (78.2 kg)  04/02/20 174 lb (78.9 kg)     Other studies Reviewed: Additional studies/ records that were reviewed today include:  Review of the above records demonstrates:   CCTA 02/13/20:  IMPRESSION: 1. Coronary calcium score of 0. This was 0 percentile for age and sex matched control.  2. Normal coronary origin with right dominance.  3. No evidence of CAD.  CADRADS 0.  4. Mild aortic atherosclerosis.   ASSESSMENT AND PLAN:  1. Atypical chest pain -Underwent coronary CTA 02/13/20 which shoed a calcium score of 0 with normal coronary anatomy.  -No recurrence>>feels symptoms were in the setting of pulled muscle  -Continue statin   2. Known Iliac atherosclerosis on the right side -Found on CT>>given elevated LDL of 158 on 05/22/2019 therefore placed on Crestor 5mg  PO QD -Plan for repeat lipid panel and LFTS today   3. Mixed hyperlipidemia -Continue statin -Recheck labs today    Current medicines are reviewed at length with the patient today.  The patient does not have concerns regarding medicines.  The following changes have been made:  no change  Labs/ tests ordered  today include: Lipid panel, LFTS  Orders Placed This Encounter  Procedures   Comprehensive metabolic panel   Lipid panel     Disposition:   FU with Lori [name]  in 1 year  Signed, Kathyrn Drown, NP  05/04/2020 10:03 AM    [address], [place], [place]  [address] Phone: [phone]; Fax: [phone]

## 2020-04-30 NOTE — Progress Notes (Signed)
Virtual Visit via Video Note  I connected with Lori [name] on 05/01/20 at  8:45 AM EDT by a video enabled telemedicine application and verified that I am speaking with the correct person using two identifiers.   I discussed the limitations of evaluation and management by telemedicine and the availability of in person appointments. The patient expressed understanding and agreed to proceed.  Present for the visit:  Myself, Dr Billey Gosling, Gordy Savers.  The patient is currently at home and I am in the office.    No referring provider.    History of Present Illness: She is here for an acute visit for cold symptoms.   Her symptoms started about 2 weeks.   She is experiencing dry cough intermittently.  She feels irritation in her throat, but no soreness or drainage.  She denies any cold symptoms.    She coughs with her mask on. She coughs more at night.  She has it when she is in a small room when the air is not moving.  She does not cough outside.  She thinks it is primarily at night.  She denies GERD.    She has tried taking robitussin, which helps some.    Review of Systems  Constitutional: Negative for chills and fever.  HENT: Negative for congestion, ear pain, sinus pain and sore throat.   Respiratory: Positive for cough (dry ). Negative for shortness of breath and wheezing.   Neurological: Negative for headaches.      Social History   Socioeconomic History  . Marital status: Widowed    Spouse name: Not on file  . Number of children: 2  . Years of education: Not on file  . Highest education level: Not on file  Occupational History  . Not on file  Tobacco Use  . Smoking status: Former Smoker    Packs/day: 1.00    Years: 10.00    Pack years: 10.00    Types: Cigarettes    Start date: 04/13/1971    Quit date: 11/01/1993    Years since quitting: 26.5  . Smokeless tobacco: Never Used  Vaping Use  . Vaping Use: Never used  Substance and Sexual Activity  . Alcohol use: Yes     Alcohol/week: 0.0 standard drinks    Comment: wine  . Drug use: No  . Sexual activity: Yes    Birth control/protection: Surgical  Other Topics Concern  . Not on file  Social History Narrative   Exercising regularly - senior exercises, walking   Has 2 children.   Social Determinants of Health   Financial Resource Strain:   . Difficulty of Paying Living Expenses:   Food Insecurity:   . Worried About Charity fundraiser in the Last Year:   . Arboriculturist in the Last Year:   Transportation Needs:   . Film/video editor (Medical):   Marland Kitchen Lack of Transportation (Non-Medical):   Physical Activity:   . Days of Exercise per Week:   . Minutes of Exercise per Session:   Stress:   . Feeling of Stress :   Social Connections:   . Frequency of Communication with Friends and Family:   . Frequency of Social Gatherings with Friends and Family:   . Attends Religious Services:   . Active Member of Clubs or Organizations:   . Attends Archivist Meetings:   Marland Kitchen Marital Status:      Observations/Objective: Appears well in NAD Breathing normal Skin appears warm and  dry  Assessment and Plan:  See Problem List for Assessment and Plan of chronic medical problems.   Follow Up Instructions:    I discussed the assessment and treatment plan with the patient. The patient was provided an opportunity to ask questions and all were answered. The patient agreed with the plan and demonstrated an understanding of the instructions.   The patient was advised to call back or seek an in-person evaluation if the symptoms worsen or if the condition fails to improve as anticipated.    Binnie Rail, MD

## 2020-05-01 ENCOUNTER — Telehealth (INDEPENDENT_AMBULATORY_CARE_PROVIDER_SITE_OTHER): Payer: Federal, State, Local not specified - PPO | Admitting: Internal Medicine

## 2020-05-01 ENCOUNTER — Encounter: Payer: Self-pay | Admitting: Internal Medicine

## 2020-05-01 DIAGNOSIS — R05 Cough: Secondary | ICD-10-CM | POA: Diagnosis not present

## 2020-05-01 DIAGNOSIS — R059 Cough, unspecified: Secondary | ICD-10-CM

## 2020-05-01 MED ORDER — HYDROCODONE-HOMATROPINE 5-1.5 MG/5ML PO SYRP: 5.0000 mL | ORAL_SOLUTION | Freq: Three times a day (TID) | ORAL | 0 refills | Status: DC | PRN

## 2020-05-01 NOTE — Assessment & Plan Note (Signed)
Acute Present for 2 weeks Dry, intermittent - no other symptoms Situational No evidence of infection Denies gerd, but there may be some silent gerd - occurs mostly at night -advised if it is not better try pepcid Will try hycodan - she thinks this always helps here cough go away - ok to try - sent to pharmacy

## 2020-05-04 ENCOUNTER — Ambulatory Visit: Payer: Federal, State, Local not specified - PPO | Attending: Internal Medicine

## 2020-05-04 ENCOUNTER — Other Ambulatory Visit: Payer: Self-pay

## 2020-05-04 ENCOUNTER — Ambulatory Visit: Payer: Federal, State, Local not specified - PPO | Admitting: Cardiology

## 2020-05-04 ENCOUNTER — Encounter: Payer: Self-pay | Admitting: Cardiology

## 2020-05-04 VITALS — BP 102/58 | HR 77 | Ht 64.0 in | Wt 174.4 lb

## 2020-05-04 DIAGNOSIS — R0789 Other chest pain: Secondary | ICD-10-CM

## 2020-05-04 DIAGNOSIS — E785 Hyperlipidemia, unspecified: Secondary | ICD-10-CM | POA: Diagnosis not present

## 2020-05-04 DIAGNOSIS — Z79899 Other long term (current) drug therapy: Secondary | ICD-10-CM

## 2020-05-04 DIAGNOSIS — Z20822 Contact with and (suspected) exposure to covid-19: Secondary | ICD-10-CM

## 2020-05-04 LAB — COMPREHENSIVE METABOLIC PANEL
ALT: 18 IU/L (ref 0–32)
AST: 23 IU/L (ref 0–40)
Albumin/Globulin Ratio: 1.9 (ref 1.2–2.2)
Albumin: 4.4 g/dL (ref 3.8–4.8)
Alkaline Phosphatase: 77 IU/L (ref 48–121)
BUN/Creatinine Ratio: 16 (ref 12–28)
BUN: 14 mg/dL (ref 8–27)
Bilirubin Total: 0.7 mg/dL (ref 0.0–1.2)
CO2: 24 mmol/L (ref 20–29)
Calcium: 9.1 mg/dL (ref 8.7–10.3)
Chloride: 104 mmol/L (ref 96–106)
Creatinine, Ser: 0.85 mg/dL (ref 0.57–1.00)
GFR calc Af Amer: 82 mL/min/{1.73_m2} (ref >59)
GFR calc non Af Amer: 71 mL/min/{1.73_m2} (ref >59)
Globulin, Total: 2.3 g/dL (ref 1.5–4.5)
Glucose: 101 mg/dL — ABNORMAL HIGH (ref 65–99)
Potassium: 3.9 mmol/L (ref 3.5–5.2)
Sodium: 141 mmol/L (ref 134–144)
Total Protein: 6.7 g/dL (ref 6.0–8.5)

## 2020-05-04 LAB — LIPID PANEL
Chol/HDL Ratio: 2.6 ratio (ref 0.0–4.4)
Cholesterol, Total: 224 mg/dL — ABNORMAL HIGH (ref 100–199)
HDL: 85 mg/dL (ref >39)
LDL Chol Calc (NIH): 123 mg/dL — ABNORMAL HIGH (ref 0–99)
Triglycerides: 90 mg/dL (ref 0–149)
VLDL Cholesterol Cal: 16 mg/dL (ref 5–40)

## 2020-05-04 NOTE — Patient Instructions (Signed)
Medication Instructions:  Your physician recommends that you continue on your current medications as directed. Please refer to the Current Medication list given to you today.  *If you need a refill on your cardiac medications before your next appointment, please call your pharmacy*   Lab Work: TODAY: CMET, LIPIDS If you have labs (blood work) drawn today and your tests are completely normal, you will receive your results only by: Marland Kitchen MyChart Message (if you have MyChart) OR . A paper copy in the mail If you have any lab test that is abnormal or we need to change your treatment, we will call you to review the results.   Testing/Procedures: NONE   Follow-Up: At [facility], you and your health needs are our priority.  As part of our continuing mission to provide you with exceptional heart care, we have created designated Provider Care Teams.  These Care Teams include your primary Cardiologist (physician) and Advanced Practice Providers (APPs -  Physician Assistants and Nurse Practitioners) who all work together to provide you with the care you need, when you need it.  We recommend signing up for the patient portal called "MyChart".  Sign up information is provided on this After Visit Summary.  MyChart is used to connect with patients for Virtual Visits (Telemedicine).  Patients are able to view lab/test results, encounter notes, upcoming appointments, etc.  Non-urgent messages can be sent to your provider as well.   To learn more about what you can do with MyChart, go to [email or web address].    Your next appointment:   12 month(s)  The format for your next appointment:   In Person  Provider:   You may see Dr. Marlou Porch or one of the following Advanced Practice Providers on your designated Care Team:    Truitt Merle, NP  Cecilie Kicks, NP  Kathyrn Drown, NP

## 2020-05-05 LAB — SARS-COV-2, NAA 2 DAY TAT

## 2020-05-05 LAB — NOVEL CORONAVIRUS, NAA: SARS-CoV-2, NAA: NOT DETECTED

## 2020-05-07 ENCOUNTER — Telehealth: Payer: Self-pay | Admitting: *Deleted

## 2020-05-07 DIAGNOSIS — E782 Mixed hyperlipidemia: Secondary | ICD-10-CM

## 2020-05-07 MED ORDER — ROSUVASTATIN CALCIUM 10 MG PO TABS
10.0000 mg | ORAL_TABLET | Freq: Every day | ORAL | 5 refills | Status: DC
Start: 2020-05-07 — End: 2020-07-17

## 2020-05-07 NOTE — Telephone Encounter (Signed)
Spoke to patient. Reviewed results. Patient verbalized understanding and is agreeable to plan. Lab scheduled for September 15.

## 2020-05-07 NOTE — Telephone Encounter (Signed)
-----   Message from Tommie Raymond, NP sent at 05/07/2020  3:09 PM EDT ----- Please let the patient know that her cholesterol is elevated. Lets have her increased her Crestor to 10mg  QD and will recheck labs in 2 months

## 2020-05-21 DIAGNOSIS — I708 Atherosclerosis of other arteries: Secondary | ICD-10-CM | POA: Insufficient documentation

## 2020-05-21 NOTE — Patient Instructions (Addendum)
Blood work was ordered.    All other Health Maintenance issues reviewed.   All recommended immunizations and age-appropriate screenings are up-to-date or discussed.  No immunization administered today.   Medications reviewed and updated.  Changes include :   none  Your prescription(s) have been submitted to your pharmacy. Please take as directed and contact our office if you believe you are having problem(s) with the medication(s).    Please followup in 1 year    Health Maintenance, Female Adopting a healthy lifestyle and getting preventive care are important in promoting health and wellness. Ask your health care provider about:  The right schedule for you to have regular tests and exams.  Things you can do on your own to prevent diseases and keep yourself healthy. What should I know about diet, weight, and exercise? Eat a healthy diet   Eat a diet that includes plenty of vegetables, fruits, low-fat dairy products, and lean protein.  Do not eat a lot of foods that are high in solid fats, added sugars, or sodium. Maintain a healthy weight Body mass index (BMI) is used to identify weight problems. It estimates body fat based on height and weight. Your health care provider can help determine your BMI and help you achieve or maintain a healthy weight. Get regular exercise Get regular exercise. This is one of the most important things you can do for your health. Most adults should:  Exercise for at least 150 minutes each week. The exercise should increase your heart rate and make you sweat (moderate-intensity exercise).  Do strengthening exercises at least twice a week. This is in addition to the moderate-intensity exercise.  Spend less time sitting. Even light physical activity can be beneficial. Watch cholesterol and blood lipids Have your blood tested for lipids and cholesterol at 67 years of age, then have this test every 5 years. Have your cholesterol levels checked more  often if:  Your lipid or cholesterol levels are high.  You are older than 67 years of age.  You are at high risk for heart disease. What should I know about cancer screening? Depending on your health history and family history, you may need to have cancer screening at various ages. This may include screening for:  Breast cancer.  Cervical cancer.  Colorectal cancer.  Skin cancer.  Lung cancer. What should I know about heart disease, diabetes, and high blood pressure? Blood pressure and heart disease  High blood pressure causes heart disease and increases the risk of stroke. This is more likely to develop in people who have high blood pressure readings, are of African descent, or are overweight.  Have your blood pressure checked: ? Every 3-5 years if you are 18-39 years of age. ? Every year if you are 40 years old or older. Diabetes Have regular diabetes screenings. This checks your fasting blood sugar level. Have the screening done:  Once every three years after age 40 if you are at a normal weight and have a low risk for diabetes.  More often and at a younger age if you are overweight or have a high risk for diabetes. What should I know about preventing infection? Hepatitis B If you have a higher risk for hepatitis B, you should be screened for this virus. Talk with your health care provider to find out if you are at risk for hepatitis B infection. Hepatitis C Testing is recommended for:  Everyone born from 1945 through 1965.  Anyone with known risk factors for hepatitis   C. Sexually transmitted infections (STIs)  Get screened for STIs, including gonorrhea and chlamydia, if: ? You are sexually active and are younger than 67 years of age. ? You are older than 67 years of age and your health care provider tells you that you are at risk for this type of infection. ? Your sexual activity has changed since you were last screened, and you are at increased risk for chlamydia  or gonorrhea. Ask your health care provider if you are at risk.  Ask your health care provider about whether you are at high risk for HIV. Your health care provider may recommend a prescription medicine to help prevent HIV infection. If you choose to take medicine to prevent HIV, you should first get tested for HIV. You should then be tested every 3 months for as long as you are taking the medicine. Pregnancy  If you are about to stop having your period (premenopausal) and you may become pregnant, seek counseling before you get pregnant.  Take 400 to 800 micrograms (mcg) of folic acid every day if you become pregnant.  Ask for birth control (contraception) if you want to prevent pregnancy. Osteoporosis and menopause Osteoporosis is a disease in which the bones lose minerals and strength with aging. This can result in bone fractures. If you are 65 years old or older, or if you are at risk for osteoporosis and fractures, ask your health care provider if you should:  Be screened for bone loss.  Take a calcium or vitamin D supplement to lower your risk of fractures.  Be given hormone replacement therapy (HRT) to treat symptoms of menopause. Follow these instructions at home: Lifestyle  Do not use any products that contain nicotine or tobacco, such as cigarettes, e-cigarettes, and chewing tobacco. If you need help quitting, ask your health care provider.  Do not use street drugs.  Do not share needles.  Ask your health care provider for help if you need support or information about quitting drugs. Alcohol use  Do not drink alcohol if: ? Your health care provider tells you not to drink. ? You are pregnant, may be pregnant, or are planning to become pregnant.  If you drink alcohol: ? Limit how much you use to 0-1 drink a day. ? Limit intake if you are breastfeeding.  Be aware of how much alcohol is in your drink. In the [place] one drink equals one 12 oz bottle of beer (355 mL), one 5 oz  glass of wine (148 mL), or one 1 oz glass of hard liquor (44 mL). General instructions  Schedule regular health, dental, and eye exams.  Stay current with your vaccines.  Tell your health care provider if: ? You often feel depressed. ? You have ever been abused or do not feel safe at home. Summary  Adopting a healthy lifestyle and getting preventive care are important in promoting health and wellness.  Follow your health care provider's instructions about healthy diet, exercising, and getting tested or screened for diseases.  Follow your health care provider's instructions on monitoring your cholesterol and blood pressure. This information is not intended to replace advice given to you by your health care provider. Make sure you discuss any questions you have with your health care provider. Document Revised: 10/03/2018 Document Reviewed: 10/03/2018 Elsevier Patient Education  2020 [employer]  

## 2020-05-21 NOTE — Progress Notes (Signed)
Subjective:    Patient ID: Lori [name], female    DOB: 1951-05-11, 69 y.o.   MRN: [id number]  HPI She is here for a physical exam.   She saw cards recently.  Cardiac calcium score 0, has iliac arthrosclerosis - placed on crestor and dose just increased.   She still has knee pain- it flares up after increased activity.    Medications and allergies reviewed with patient and updated if appropriate.  Patient Active Problem List   Diagnosis Date Noted  . Atherosclerosis of right iliac artery 05/21/2020  . Arthritis of right knee 07/24/2019  . Left wrist pain 07/03/2019  . Acute pain of left shoulder 07/03/2019  . Headache 06/28/2019  . Chronic pain of right knee 05/22/2019  . Atypical chest pain 11/14/2018  . Stress at home 11/14/2018  . Hyperglycemia 05/08/2018  . Vertigo 04/17/2018  . Cough 11/21/2017  . Left arm numbness 11/21/2017  . Pyelonephritis 05/02/2017  . Hyperlipidemia 04/27/2017  . Personal history of colonic polyps 06/06/2014  . Family history of colon cancer 06/06/2014  . Osteopenia 11/04/2013    Current Outpatient Medications on File Prior to Visit  Medication Sig Dispense Refill  . Ascorbic Acid (VITAMIN C) 500 MG CHEW Chew 1 tablet by mouth daily.    Marland Kitchen b complex vitamins capsule Take 1 capsule by mouth daily.    . Calcium Carbonate-Vitamin D 600-400 MG-UNIT tablet Take 1 tablet by mouth daily.    . Cholecalciferol (VITAMIN D3) 50 MCG (2000 UT) TABS Take 2,000 Units by mouth daily.     . COD LIVER OIL PO Take 1 capsule by mouth daily.     Marland Kitchen conjugated estrogens (PREMARIN) vaginal cream Place 1 Applicatorful vaginally as needed.    . Ginkgo Biloba 60 MG CAPS Take 60 mg by mouth daily.     Marland Kitchen HYDROcodone-homatropine (HYCODAN) 5-1.5 MG/5ML syrup Take 5 mLs by mouth every 8 (eight) hours as needed for cough. 120 mL 0  . Misc Natural Products (TART CHERRY ADVANCED) CAPS Take 1 capsule by mouth daily.     . Multiple Vitamins-Minerals (ONE-A-DAY WOMENS 50 PLUS  PO) Take 1 tablet by mouth.     . Psyllium (METAMUCIL FIBER PO) Take 8 capsules by mouth. Patient takes up 8 capsules daily    . pyridOXINE (VITAMIN B-6) 100 MG tablet Take 100 mg by mouth daily.    . Rhubarb (ESTROVEN COMPLETE PO) Take 1 tablet by mouth daily.     . rosuvastatin (CRESTOR) 10 MG tablet Take 1 tablet (10 mg total) by mouth daily. 30 tablet 5  . Turmeric (QC TUMERIC COMPLEX PO) Take 1 tablet by mouth daily.     . vitamin B-12 (CYANOCOBALAMIN) 500 MCG tablet Take 500 mcg by mouth daily.    . Vitamin E 180 MG CAPS Take 180 mg by mouth daily.     Marland Kitchen estrogens, conjugated, (PREMARIN) 0.625 MG tablet Take 0.625 mg by mouth daily. Take daily for 21 days then do not take for 7 days. (Patient not taking: Reported on 05/22/2020)     No current facility-administered medications on file prior to visit.    Past Medical History:  Diagnosis Date  . Acute pain of left shoulder 07/03/2019  . Arthritis of right knee 07/24/2019  . Atypical chest pain 11/14/2018  . Chronic pain of right knee 05/22/2019  . CTS (carpal tunnel syndrome)    chronic  . Depression 01/16/2013  . Family history of colon cancer 06/06/2014  .  Headache 06/28/2019  . History of colon polyps   . Hyperglycemia 05/08/2018  . Hyperlipidemia 04/27/2017  . Left arm numbness 11/21/2017  . Left wrist pain 07/03/2019  . Osteopenia   . Personal history of colonic polyps 06/06/2014  . PONV (postoperative nausea and vomiting)   . Pyelonephritis 05/02/2017  . Stress at home 11/14/2018   stress w/dtr who has ovarian cancer 1/20  . Tubular adenoma   . UTI (urinary tract infection)   . Vertigo 04/17/2018    Past Surgical History:  Procedure Laterality Date  . ABDOMINAL HYSTERECTOMY  1994   fibroids  . APPENDECTOMY    . BUNIONECTOMY Right   . BUNIONECTOMY Left 2018  . CHOLECYSTECTOMY    . COLONOSCOPY  12/09/2008   Dr.Rourk- normal rectum, polyp at the hepatic flexure bx= tubular adenoma. the remainder of the colonic mucosa and terminal  ileum mucosa appeared normal.  . COLONOSCOPY  2006   Dr.Sam Cool- colonic adenoma  . COLONOSCOPY N/A 06/25/2014   Procedure: COLONOSCOPY;  Surgeon: Daneil Dolin, MD;  Location: [facility];  Service: Endoscopy;  Laterality: N/A;  1030 DR REQUEST TIME  . HEMORRHOID SURGERY    . KNEE ARTHROSCOPY Right    torn meniscus  . ORIF TOE FRACTURE Right   . rotator Cuff surgery    . TUBAL LIGATION      Social History   Socioeconomic History  . Marital status: Widowed    Spouse name: Not on file  . Number of children: 2  . Years of education: Not on file  . Highest education level: Not on file  Occupational History  . Not on file  Tobacco Use  . Smoking status: Former Smoker    Packs/day: 1.00    Years: 10.00    Pack years: 10.00    Types: Cigarettes    Start date: 04/13/1971    Quit date: 11/01/1993    Years since quitting: 26.5  . Smokeless tobacco: Never Used  Vaping Use  . Vaping Use: Never used  Substance and Sexual Activity  . Alcohol use: Yes    Alcohol/week: 0.0 standard drinks    Comment: wine  . Drug use: No  . Sexual activity: Yes    Birth control/protection: Surgical  Other Topics Concern  . Not on file  Social History Narrative   Exercising regularly - senior exercises, walking   Has 2 children.   Social Determinants of Health   Financial Resource Strain:   . Difficulty of Paying Living Expenses:   Food Insecurity:   . Worried About Charity fundraiser in the Last Year:   . Arboriculturist in the Last Year:   Transportation Needs:   . Film/video editor (Medical):   Marland Kitchen Lack of Transportation (Non-Medical):   Physical Activity:   . Days of Exercise per Week:   . Minutes of Exercise per Session:   Stress:   . Feeling of Stress :   Social Connections:   . Frequency of Communication with Friends and Family:   . Frequency of Social Gatherings with Friends and Family:   . Attends Religious Services:   . Active Member of Clubs or Organizations:   .  Attends Archivist Meetings:   Marland Kitchen Marital Status:     Family History  Problem Relation Age of Onset  . Bipolar disorder Sister   . Schizophrenia Sister   . Kidney disease Sister   . Hypertension Sister   . Bipolar disorder Sister   .  Kidney disease Sister   . Hypertension Mother   . Mental illness Mother   . Dementia Mother   . Pancreatic cancer Mother   . Cancer Father 46       colon; bladder  . Hypertension Father   . Heart disease Father   . Arthritis Father   . Prostate cancer Father   . Esophageal cancer Father   . Stomach cancer Father   . Colon cancer Father   . Kidney disease Father   . Bladder Cancer Father   . Ovarian cancer Daughter   . Bipolar disorder Son   . Liver cancer Neg Hx     Review of Systems  Constitutional: Negative for chills and fever.  Eyes: Negative for visual disturbance.  Respiratory: Negative for cough, shortness of breath and wheezing.   Cardiovascular: Negative for chest pain, palpitations and leg swelling.  Gastrointestinal: Positive for constipation (metamucil controlled). Negative for abdominal pain, blood in stool, diarrhea and nausea.       No gerd  Genitourinary: Negative for dysuria.  Musculoskeletal: Positive for arthralgias (left knee).  Skin: Negative for color change and rash.  Neurological: Negative for light-headedness and headaches.  Psychiatric/Behavioral: Negative for dysphoric mood. The patient is nervous/anxious (situational).        Objective:   Vitals:   05/22/20 1008  BP: (!) 132/80  Pulse: 72  Temp: 98.2 F (36.8 C)  SpO2: 98%   Filed Weights   05/22/20 1008  Weight: 174 lb (78.9 kg)   Body mass index is 29.87 kg/m.  BP Readings from Last 3 Encounters:  05/22/20 (!) 132/80  05/04/20 (!) 102/58  04/09/20 130/78    Wt Readings from Last 3 Encounters:  05/22/20 174 lb (78.9 kg)  05/04/20 174 lb 6.4 oz (79.1 kg)  04/09/20 172 lb 6.4 oz (78.2 kg)     Physical Exam Constitutional: She  appears well-developed and well-nourished. No distress.  HENT:  Head: Normocephalic and atraumatic.  Right Ear: External ear normal. Normal ear canal and TM Left Ear: External ear normal.  Normal ear canal and TM Mouth/Throat: Oropharynx is clear and moist.  Eyes: Conjunctivae and EOM are normal.  Neck: Neck supple. No tracheal deviation present. No thyromegaly present.  No carotid bruit  Cardiovascular: Normal rate, regular rhythm and normal heart sounds.   No murmur heard.  No edema. Pulmonary/Chest: Effort normal and breath sounds normal. No respiratory distress. She has no wheezes. She has no rales.  Breast: deferred   Abdominal: Soft. She exhibits no distension. There is no tenderness.  Lymphadenopathy: She has no cervical adenopathy.  Skin: Skin is warm and dry. She is not diaphoretic.  Psychiatric: She has a normal mood and affect. Her behavior is normal.        Assessment & Plan:   Physical exam: Screening blood work    ordered Immunizations  Up to date Colonoscopy  Up to date  Mammogram  Up to date  Gyn  Up to date  Dexa  Due - ordered Eye exams  Up to date  Exercise  none Weight  overweight Substance abuse  none  See Problem List for Assessment and Plan of chronic medical problems.   This visit occurred during the SARS-CoV-2 public health emergency.  Safety protocols were in place, including screening questions prior to the visit, additional usage of staff PPE, and extensive cleaning of exam room while observing appropriate contact time as indicated for disinfecting solutions.

## 2020-05-22 ENCOUNTER — Ambulatory Visit (INDEPENDENT_AMBULATORY_CARE_PROVIDER_SITE_OTHER): Payer: Federal, State, Local not specified - PPO | Admitting: Internal Medicine

## 2020-05-22 ENCOUNTER — Other Ambulatory Visit: Payer: Self-pay

## 2020-05-22 ENCOUNTER — Encounter: Payer: Self-pay | Admitting: Internal Medicine

## 2020-05-22 VITALS — BP 132/80 | HR 72 | Temp 98.2°F | Ht 64.0 in | Wt 174.0 lb

## 2020-05-22 DIAGNOSIS — I708 Atherosclerosis of other arteries: Secondary | ICD-10-CM

## 2020-05-22 DIAGNOSIS — E782 Mixed hyperlipidemia: Secondary | ICD-10-CM | POA: Diagnosis not present

## 2020-05-22 DIAGNOSIS — Z0000 Encounter for general adult medical examination without abnormal findings: Secondary | ICD-10-CM | POA: Diagnosis not present

## 2020-05-22 DIAGNOSIS — M8589 Other specified disorders of bone density and structure, multiple sites: Secondary | ICD-10-CM

## 2020-05-22 DIAGNOSIS — R739 Hyperglycemia, unspecified: Secondary | ICD-10-CM

## 2020-05-22 MED ORDER — ALPRAZOLAM 0.25 MG PO TABS: 0.2500 mg | ORAL_TABLET | Freq: Every day | ORAL | 0 refills | Status: DC | PRN

## 2020-05-22 NOTE — Assessment & Plan Note (Signed)
Chronic dexa due - ordered  Encouraged regular exercise Taking cal, vitamin d

## 2020-05-22 NOTE — Assessment & Plan Note (Signed)
Chronic On crestor  Managed by cardio

## 2020-05-22 NOTE — Assessment & Plan Note (Signed)
Chronic Continue daily statin Regular exercise and healthy diet encouraged  

## 2020-05-22 NOTE — Assessment & Plan Note (Signed)
Chronic Check a1c Low sugar / carb diet Stressed regular exercise  

## 2020-05-23 LAB — HEMOGLOBIN A1C
Hgb A1c MFr Bld: 5.4 % of total Hgb (ref <5.7)
Mean Plasma Glucose: 108 (calc)
eAG (mmol/L): 6 (calc)

## 2020-05-23 LAB — CBC WITH DIFFERENTIAL/PLATELET
Absolute Monocytes: 310 cells/uL (ref 200–950)
Basophils Absolute: 40 cells/uL (ref 0–200)
Basophils Relative: 1.1 %
Eosinophils Absolute: 119 cells/uL (ref 15–500)
Eosinophils Relative: 3.3 %
HCT: 43 % (ref 35.0–45.0)
Hemoglobin: 14.6 g/dL (ref 11.7–15.5)
Lymphs Abs: 1516 cells/uL (ref 850–3900)
MCH: 31 pg (ref 27.0–33.0)
MCHC: 34 g/dL (ref 32.0–36.0)
MCV: 91.3 fL (ref 80.0–100.0)
MPV: 10.4 fL (ref 7.5–12.5)
Monocytes Relative: 8.6 %
Neutro Abs: 1616 cells/uL (ref 1500–7800)
Neutrophils Relative %: 44.9 %
Platelets: 268 10*3/uL (ref 140–400)
RBC: 4.71 10*6/uL (ref 3.80–5.10)
RDW: 13.7 % (ref 11.0–15.0)
Total Lymphocyte: 42.1 %
WBC: 3.6 10*3/uL — ABNORMAL LOW (ref 3.8–10.8)

## 2020-05-23 LAB — TSH: TSH: 1.03 mIU/L (ref 0.40–4.50)

## 2020-06-01 ENCOUNTER — Other Ambulatory Visit: Payer: Self-pay

## 2020-06-01 ENCOUNTER — Encounter: Payer: Self-pay | Admitting: Family Medicine

## 2020-06-01 ENCOUNTER — Ambulatory Visit: Payer: Federal, State, Local not specified - PPO

## 2020-06-01 ENCOUNTER — Ambulatory Visit: Payer: Self-pay

## 2020-06-01 ENCOUNTER — Ambulatory Visit: Payer: Federal, State, Local not specified - PPO | Admitting: Family Medicine

## 2020-06-01 VITALS — BP 124/72 | HR 85 | Ht 64.0 in | Wt 175.2 lb

## 2020-06-01 DIAGNOSIS — M25472 Effusion, left ankle: Secondary | ICD-10-CM

## 2020-06-01 DIAGNOSIS — M79662 Pain in left lower leg: Secondary | ICD-10-CM

## 2020-06-01 DIAGNOSIS — G8929 Other chronic pain: Secondary | ICD-10-CM | POA: Diagnosis not present

## 2020-06-01 DIAGNOSIS — M25562 Pain in left knee: Secondary | ICD-10-CM | POA: Diagnosis not present

## 2020-06-01 NOTE — Patient Instructions (Addendum)
Thank you for coming in today. Plan for [place] today at [facility] [address] [place], [place] [address] 4pm   Also plan for MRI.   Recheck after the MRI.    I think the main issue is a ruptured baker cyst but it could also be a blood clot. We will figure it out today.

## 2020-06-01 NOTE — Progress Notes (Signed)
I, [name], LAT, ATC, am serving as scribe for [name].  Lori [name] is a 69 y.o. female who presents to [facility] today for f/u of L post knee pain after competing in the [employer] on 02/29/20.  She was last seen by [name] on 04/09/20 after having had a L knee injection on 04/02/20.  Since her last visit, pt reports L lower leg pain and swelling.  She also reports L post knee pain.  She has been icing and elevating and taking IBU.  Swelling developed over the last few days.  She has mild pain in the knee and down to the calf.  Diagnostic testing: L knee XR- 04/02/20   Pertinent review of systems: No fevers or chills.  No trouble breathing cough or shortness of breath or chest pain.  Relevant historical information: Hyperlipidemia.  No history of DVT   Exam:  BP 124/72 (BP Location: Right Arm, Patient Position: Sitting, Cuff Size: Normal)   Pulse 85   Ht 5\' 4"  (1.626 m)   Wt 175 lb 3.2 oz (79.5 kg)   SpO2 97%   BMI 30.07 kg/m  General: Well Developed, well nourished, and in no acute distress.   MSK: Left knee mild effusion otherwise normal-appearing Normal motion. Left lower leg and calf 1+ nonpitting edema.  No palpable cords.  Calf is nontender.    Lab and Radiology Results Diagnostic Limited MSK Ultrasound of: Left knee and calf Quad tendon intact normal-appearing.  Small joint effusion. Patellar tendon normal. Narrowed degenerative appearing medial and lateral joint lines. Posterior knee reveals a small Baker's cyst. Hypoechoic fluid tracking down posterior calf superficial to muscle layer deep to subcutaneous fat tracking within subcutaneous fat indicating edema. Impression: Small Baker's cyst with probable ruptured Baker's cyst.      Assessment and Plan: 69 y.o. female with left calf swelling.  Probably due to a ruptured Baker's cyst but DVT has not been ruled out.  We will plan for vascular ultrasound to rule out  DVT today.  However patient continues to experience knee pain and swelling not much better with conservative management including steroid injection.  Proceed with MRI of the knee to further evaluate knee pain and for potential injection or surgical planning.   PDMP not reviewed this encounter. Orders Placed This Encounter  Procedures  . [place] LIMITED JOINT SPACE STRUCTURES LOW LEFT(NO LINKED CHARGES)    Order Specific Question:   Reason for Exam (SYMPTOM  OR DIAGNOSIS REQUIRED)    Answer:   L leg pain and swelling    Order Specific Question:   Preferred imaging location?    Answer:   [facility]  . US Venous Img Lower Unilateral Left (DVT)    Standing Status:   Future    Standing Expiration Date:   06/01/2021    Order Specific Question:   Reason for Exam (SYMPTOM  OR DIAGNOSIS REQUIRED)    Answer:   eval left leg swelling    Order Specific Question:   Preferred imaging location?    Answer:   [facility]  . MR Knee Left  Wo Contrast    Standing Status:   Future    Standing Expiration Date:   06/01/2021    Order Specific Question:   What is the patient's sedation requirement?    Answer:   No Sedation    Order Specific Question:   Does the patient have a pacemaker or implanted  devices?    Answer:   No    Order Specific Question:   Preferred imaging location?    Answer:   Product[facility]process development [facility] (table limit-350lbs)    Order Specific Question:   Radiology Contrast Protocol - do NOT remove file path    Answer:   [email or web address]   No orders of the defined types were placed in this encounter.    Discussed warning signs or symptoms. Please see discharge instructions. Patient expresses understanding.   The above documentation has been reviewed and is accurate and complete [name], M.D.

## 2020-06-01 NOTE — Progress Notes (Signed)
Good news! No DVT.  Radiology did see a small Baker's cyst that I saw as well.  However no blood clot in the leg.

## 2020-06-02 ENCOUNTER — Other Ambulatory Visit: Payer: Self-pay

## 2020-06-02 ENCOUNTER — Ambulatory Visit (INDEPENDENT_AMBULATORY_CARE_PROVIDER_SITE_OTHER): Payer: Federal, State, Local not specified - PPO

## 2020-06-02 DIAGNOSIS — M25562 Pain in left knee: Secondary | ICD-10-CM | POA: Diagnosis not present

## 2020-06-02 DIAGNOSIS — M25472 Effusion, left ankle: Secondary | ICD-10-CM | POA: Diagnosis not present

## 2020-06-02 DIAGNOSIS — M79662 Pain in left lower leg: Secondary | ICD-10-CM | POA: Diagnosis not present

## 2020-06-02 DIAGNOSIS — G8929 Other chronic pain: Secondary | ICD-10-CM

## 2020-06-02 NOTE — Progress Notes (Signed)
MRI shows significant tear of the medial and lateral meniscus as well as possible strain of the MCL and medium arthritis of the knee.  Additionally there is a ruptured Baker's cyst.  Recommend return to clinic to discuss the MRI results in full detail in the near future.

## 2020-06-04 ENCOUNTER — Ambulatory Visit: Payer: Self-pay

## 2020-06-04 ENCOUNTER — Other Ambulatory Visit: Payer: Self-pay

## 2020-06-04 ENCOUNTER — Encounter: Payer: Self-pay | Admitting: Family Medicine

## 2020-06-04 ENCOUNTER — Ambulatory Visit: Payer: Federal, State, Local not specified - PPO | Admitting: Family Medicine

## 2020-06-04 VITALS — BP 116/70 | HR 78 | Ht 64.0 in | Wt 173.8 lb

## 2020-06-04 DIAGNOSIS — M7122 Synovial cyst of popliteal space [Baker], left knee: Secondary | ICD-10-CM | POA: Insufficient documentation

## 2020-06-04 DIAGNOSIS — M25472 Effusion, left ankle: Secondary | ICD-10-CM

## 2020-06-04 DIAGNOSIS — M25562 Pain in left knee: Secondary | ICD-10-CM

## 2020-06-04 NOTE — Patient Instructions (Signed)
Thank you for coming in today. Continue compression.  Recheck if not improving.  OK to advance activity as tolerated.    Meniscus Tear  A meniscus tear is a knee injury that happens when a piece of the meniscus is torn. The meniscus is a thick, rubbery, wedge-shaped cartilage in the knee. Two menisci are located in each knee. They sit between the upper bone (femur) and lower bone (tibia) that make up the knee joint. Each meniscus acts as a shock absorber for the knee. A torn meniscus is one of the most common types of knee injuries. This injury can range from mild to severe. Surgery may be needed to repair a severe tear. What are the causes? This condition may be caused by any kneeling, squatting, twisting, or pivoting movement. Sports-related injuries are the most common cause. These often occur from:  Running and stopping suddenly. ? Changing direction. ? Being tackled or knocked off your feet.  Lifting or carrying heavy weights. As people get older, their menisci get thinner and weaker. In these people, tears can happen more easily, such as from climbing stairs. What increases the risk? You are more likely to develop this condition if you:  Play contact sports.  Have a job that requires kneeling or squatting.  Are female.  Are over 38 years old. What are the signs or symptoms? Symptoms of this condition include:  Knee pain, especially at the side of the knee joint. You may feel pain when the injury occurs, or you may only hear a pop and feel pain later.  A feeling that your knee is clicking, catching, locking, or giving way.  Not being able to fully bend or extend your knee.  Bruising or swelling in your knee. How is this diagnosed? This condition may be diagnosed based on your symptoms and a physical exam. You may also have tests, such as:  X-rays.  MRI.  A procedure to look inside your knee with a narrow surgical telescope (arthroscopy). You may be referred to a  knee specialist (orthopedic surgeon). How is this treated? Treatment for this injury depends on the severity of the tear. Treatment for a mild tear may include:  Rest.  Medicine to reduce pain and swelling. This is usually a nonsteroidal anti-inflammatory drug (NSAID), like ibuprofen.  A knee brace, sleeve, or wrap.  Using crutches or a walker to keep weight off your knee and to help you walk.  Exercises to strengthen your knee (physical therapy). You may need surgery if you have a severe tear or if other treatments are not working. Follow these instructions at home: If you have a brace, sleeve, or wrap:  Wear it as told by your health care provider. Remove it only as told by your health care provider.  Loosen the brace, sleeve, or wrap if your toes tingle, become numb, or turn cold and blue.  Keep the brace, sleeve, or wrap clean and dry.  If the brace, sleeve, or wrap is not waterproof: ? Do not let it get wet. ? Cover it with a watertight covering when you take a bath or shower. Managing pain and swelling   Take over-the-counter and prescription medicines only as told by your health care provider.  If directed, put ice on your knee: ? If you have a removable brace, sleeve, or wrap, remove it as told by your health care provider. ? Put ice in a plastic bag. ? Place a towel between your skin and the bag. ? Leave the  ice on for 20 minutes, 2-3 times per day.  Move your toes often to avoid stiffness and to lessen swelling.  Raise (elevate) the injured area above the level of your heart while you are sitting or lying down. Activity  Do not use the injured limb to support your body weight until your health care provider says that you can. Use crutches or a walker as told by your health care provider.  Return to your normal activities as told by your health care provider. Ask your health care provider what activities are safe for you.  Perform range-of-motion exercises only  as told by your health care provider.  Begin doing exercises to strengthen your knee and leg muscles only as told by your health care provider. After you recover, your health care provider may recommend these exercises to help prevent another injury. General instructions  Use a knee brace, sleeve, or wrap as told by your health care provider.  Ask your health care provider when it is safe to drive if you have a brace, sleeve, or wrap on your knee.  Do not use any products that contain nicotine or tobacco, such as cigarettes, e-cigarettes, and chewing tobacco. If you need help quitting, ask your health care provider.  Ask your health care provider if the medicine prescribed to you: ? Requires you to avoid driving or using heavy machinery. ? Can cause constipation. You may need to take these actions to prevent or treat constipation:  Drink enough fluid to keep your urine pale yellow.  Take over-the-counter or prescription medicines.  Eat foods that are high in fiber, such as beans, whole grains, and fresh fruits and vegetables.  Limit foods that are high in fat and processed sugars, such as fried or sweet foods.  Keep all follow-up visits as told by your health care provider. This is important. Contact a health care provider if:  You have a fever.  Your knee becomes red, tender, or swollen.  Your pain medicine is not helping.  Your symptoms get worse or do not improve after 2 weeks of home care. Summary  A meniscus tear is a knee injury that happens when a piece of the meniscus is torn.  Treatment for this injury depends on the severity of the tear. You may need surgery if you have a severe tear or if other treatments are not working.  Rest, ice, and raise (elevate) your injured knee as told by your health care provider. This will help lessen pain and swelling.  Contact a health care provider if you have new symptoms, or your symptoms get worse or do not improve after 2 weeks  of home care.  Keep all follow-up visits as told by your health care provider. This is important. This information is not intended to replace advice given to you by your health care provider. Make sure you discuss any questions you have with your health care provider. Document Revised: 04/24/2018 Document Reviewed: 04/24/2018 Elsevier Patient Education  [place] Cyst  A Baker cyst, also called a popliteal cyst, is a growth that forms at the back of the knee. The cyst forms when the fluid-filled sac (bursa) that cushions the knee joint becomes enlarged. What are the causes? In most cases, a Baker cyst results from another knee problem that causes swelling inside the knee. This makes the fluid inside the knee joint (synovial fluid) flow into the bursa behind the knee, causing the bursa to enlarge. What increases the  risk? You may be more likely to develop a Baker cyst if you already have a knee problem, such as:  A tear in cartilage that cushions the knee joint (meniscal tear).  A tear in the tissues that connect the bones of the knee joint (ligament tear).  Knee swelling from osteoarthritis, rheumatoid arthritis, or gout. What are the signs or symptoms? The main symptom of this condition is a lump behind the knee. This may be the only symptom of the condition. The lump may be painful, especially when the knee is straightened. If the lump is painful, the pain may come and go. The knee may also be stiff. Symptoms may quickly get more severe if the cyst breaks open (ruptures). If the cyst ruptures, you may feel the following in your knee and calf:  Sudden or worsening pain.  Swelling.  Bruising.  Redness in the calf. A Baker cyst does not always cause symptoms. How is this diagnosed? This condition may be diagnosed based on your symptoms and medical history. Your health care provider will also do a physical exam. This may include:  Feeling the cyst to check  whether it is tender.  Checking your knee for signs of another knee condition that causes swelling. You may have imaging tests, such as:  X-rays.  MRI.  Ultrasound. How is this treated? A Baker cyst that is not painful may go away without treatment. If the cyst gets large or painful, it will likely get better if the underlying knee problem is treated. If needed, treatment for a Baker cyst may include:  Resting.  Keeping weight off of the knee. This means not leaning on the knee to support your body weight.  Taking NSAIDs, such as ibuprofen, to reduce pain and swelling.  Having a procedure to drain the fluid from the cyst with a needle (aspiration). You may also get an injection of a medicine that reduces swelling (steroid).  Having surgery. This may be needed if other treatments do not work. This usually involves correcting knee damage and removing the cyst. Follow these instructions at home:  Activity  Rest as told by your health care provider.  Avoid activities that make pain or swelling worse.  Return to your normal activities as told by your health care provider. Ask your health care provider what activities are safe for you.  Do not use the injured limb to support your body weight until your health care provider says that you can. Use crutches as told by your health care provider. General instructions  Take over-the-counter and prescription medicines only as told by your health care provider.  Keep all follow-up visits as told by your health care provider. This is important. Contact a health care provider if:  You have knee pain, stiffness, or swelling that does not get better. Get help right away if:  You have sudden or worsening pain and swelling in your calf area. Summary  A Baker cyst, also called a popliteal cyst, is a growth that forms at the back of the knee.  In most cases, a Baker cyst results from another knee problem that causes swelling inside the  knee.  A Baker cyst that is not painful may go away without treatment.  If needed, treatment for a Baker cyst may include resting, keeping weight off of the knee, medicines, or draining fluid from the cyst.  Surgery may be needed if other treatments are not effective. This information is not intended to replace advice given to you by  your health care provider. Make sure you discuss any questions you have with your health care provider. Document Revised: 02/22/2019 Document Reviewed: 02/22/2019 Elsevier Patient Education  [place].

## 2020-06-04 NOTE — Progress Notes (Signed)
I, [name], LAT, ATC, am serving as scribe for [name].  Lori [name] is a 69 y.o. female who presents to [place] at [facility] today for f/u of L knee pain and L knee MRI review.  She was last seen by [name] on 06/01/20 w/ con't L knee pain and new L lower leg pain/swelling.  She was referred for a doppler US of her L LE and for a L knee MRI and is here today to review those results.  Since her last visit, pt reports that she con't to have swelling in her L lower leg and ankle as well as L knee pain.  She doesn't notice much change.  Diagnostic imaging: L knee MRI- 06/02/20; L LE venous doppler- 06/01/20; L knee XR- 04/02/20   Pertinent review of systems: No fevers or chills  Relevant historical information: Osteopenia   Exam:  BP 116/70 (BP Location: Right Arm, Patient Position: Sitting, Cuff Size: Normal)   Pulse 78   Ht 5\' 4"  (1.626 m)   Wt 173 lb 12.8 oz (78.8 kg)   SpO2 98%   BMI 29.83 kg/m  General: Well Developed, well nourished, and in no acute distress.   MSK: Left knee small effusion otherwise normal-appearing Normal motion. Tender palpation medial joint line. Stable he was examined Lower leg nonpitting edema mildly.    Lab and Radiology Results No results found for this or any previous visit (from the past 72 hour(s)). MR Knee Left  Wo Contrast  Result Date: 06/02/2020 CLINICAL DATA:  Chronic knee pain EXAM: MRI OF THE LEFT KNEE WITHOUT CONTRAST TECHNIQUE: Multiplanar, multisequence MR imaging of the knee was performed. No intravenous contrast was administered. COMPARISON:  04/02/2020 radiographs FINDINGS: MENISCI Medial meniscus: Large radial tear of the root of the posterior horn medial meniscus, image 14/9. Amorphous degenerative signal in much of the remaining posterior horn and midbody questionable extension to the superior meniscal surface on image 9/10, potentially from additional degenerative tearing. Lateral meniscus: Oblique  grade 3 surface tear of the anterior horn with horizontal extension towards the free edge for example on images 20 through 22 of series 10. This tear likely extends into the anterior root. LIGAMENTS Cruciates:  Unremarkable Collaterals: Mildly thickened proximal MCL with adjacent edema suggesting low-grade sprain CARTILAGE Patellofemoral: Moderate degenerative chondral thinning along the medial patellar facet. Chondral thinning centrally along the femoral trochlear groove (especially inferiorly) and along the medial femoral trochlear groove. Medial: Moderate to prominent degenerative chondral thinning along the medial femoral condyle with moderate chondral thinning medially along the medial tibial plateau. Lateral:  Mild marginal spurring. Joint: Small knee effusion with associated synovitis. No current gas visible in the joint. Popliteal Fossa: Ruptured Baker's cyst with some complexity or synovitis for example on image 16/6 and image 10/8. This could possibly reflect blood products. I favor that the plantaris is probably intact although some of the appearance resembles findings encountered in plantaris tear including fluid signal between the soleus medial head gastrocnemius. Extensor Mechanism: Prepatellar subcutaneous edema, otherwise unremarkable. Bones: No significant extra-articular osseous abnormalities identified. Other: No supplemental non-categorized findings. IMPRESSION: 1. Large radial tear of the root of the posterior horn medial meniscus. Probable adjacent degenerative tearing the posterior horn. 2. Oblique grade 3 surface tear of the anterior horn lateral meniscus with horizontal extension towards the free edge. This tear likely extends into the anterior root. 3. Mildly thickened proximal MCL with adjacent edema suggesting low-grade sprain. 4. Moderate to prominent degenerative  chondral thinning in the medial compartment and medially in the patellofemoral joint. 5. Small knee effusion with associated  synovitis. 6. Ruptured Baker's cyst with some complexity or synovitis. Suspect that the fluid signal between the lateral head gastrocnemius and soleus is probably from ruptured Baker's cyst rather than a plantaris tear. Electronically Signed   By: [name]   On: 06/02/2020 10:32   US Venous Img Lower Unilateral Left (DVT)  Result Date: 06/01/2020 CLINICAL DATA:  Left leg pain and swelling EXAM: LEFT LOWER EXTREMITY VENOUS DOPPLER ULTRASOUND TECHNIQUE: Gray-scale sonography with compression, as well as color and duplex ultrasound, were performed to evaluate the deep venous system(s) from the level of the common femoral vein through the popliteal and proximal calf veins. COMPARISON:  None. FINDINGS: VENOUS Normal compressibility of the common femoral, superficial femoral, and popliteal veins, as well as the visualized calf veins. Visualized portions of profunda femoral vein and great saphenous vein unremarkable. No filling defects to suggest DVT on grayscale or color Doppler imaging. Doppler waveforms show normal direction of venous flow, normal respiratory plasticity and response to augmentation. Limited views of the contralateral common femoral vein are unremarkable. OTHER Incidentally noted is a 3 x 1 x 1.4 cm Baker's cyst. Limitations: none IMPRESSION: 1. No DVT. 2. Left-sided Baker's cyst measuring 3 cm. Electronically Signed   By: Constance Holster M.D.   On: 06/01/2020 16:11   I, [name], personally (independently) visualized and performed the interpretation of the MRI images attached in this note.   Procedure: Real-time Ultrasound Guided Injection of left knee superior lateral patellar space Device: Philips Affiniti 50G Images permanently stored and available for review in the ultrasound unit. Verbal informed consent obtained.  Discussed risks and benefits of procedure. Warned about infection bleeding damage to structures skin hypopigmentation and fat atrophy among  others. Patient expresses understanding and agreement Time-out conducted.   Noted no overlying erythema, induration, or other signs of local infection.   Skin prepped in a sterile fashion.   Local anesthesia: Topical Ethyl chloride.   With sterile technique and under real time ultrasound guidance:  40 mg of Kenalog and 2 mL of Marcaine injected easily.   Completed without difficulty   Pain immediately resolved suggesting accurate placement of the medication.   Advised to call if fevers/chills, erythema, induration, drainage, or persistent bleeding.   Images permanently stored and available for review in the ultrasound unit.  Impression: Technically successful ultrasound guided injection.  Brief look ultrasound prior to injection showed small knee effusion and a small Baker's cyst.     Assessment and Plan: 69 y.o. female with left knee pain and swelling due to DJD meniscus tears and Baker's cyst.  Ruptured Baker's cyst explains her lower leg and pain and swelling.  Discussed options.  Plan for steroid injection of the articular joint knee.  If this does not provide significant benefit I would consider an aspiration and injection of Baker's cyst as well.  Recheck back in the future if not improving.  Recommend also compression sleeve.   PDMP not reviewed this encounter. Orders Placed This Encounter  Procedures  . [place] LIMITED JOINT SPACE STRUCTURES LOW LEFT(NO LINKED CHARGES)    Order Specific Question:   Reason for Exam (SYMPTOM  OR DIAGNOSIS REQUIRED)    Answer:   L knee pain    Order Specific Question:   Preferred imaging location?    Answer:   [place]  . [place] LIMITED JOINT SPACE STRUCTURES LOW LEFT(NO  LINKED CHARGES)    Order Specific Question:   Reason for Exam (SYMPTOM  OR DIAGNOSIS REQUIRED)    Answer:   L knee pain    Order Specific Question:   Preferred imaging location?    Answer:   [place]   No orders of the defined  types were placed in this encounter.    Discussed warning signs or symptoms. Please see discharge instructions. Patient expresses understanding.   The above documentation has been reviewed and is accurate and complete [name], M.D.  Total encounter time 30 minutes including charting time date of service.

## 2020-07-08 ENCOUNTER — Other Ambulatory Visit: Payer: Self-pay

## 2020-07-08 ENCOUNTER — Other Ambulatory Visit: Payer: Federal, State, Local not specified - PPO | Admitting: *Deleted

## 2020-07-08 DIAGNOSIS — E782 Mixed hyperlipidemia: Secondary | ICD-10-CM

## 2020-07-08 LAB — LIPID PANEL
Chol/HDL Ratio: 2.7 ratio (ref 0.0–4.4)
Cholesterol, Total: 239 mg/dL — ABNORMAL HIGH (ref 100–199)
HDL: 89 mg/dL (ref >39)
LDL Chol Calc (NIH): 138 mg/dL — ABNORMAL HIGH (ref 0–99)
Triglycerides: 72 mg/dL (ref 0–149)
VLDL Cholesterol Cal: 12 mg/dL (ref 5–40)

## 2020-07-08 LAB — HEPATIC FUNCTION PANEL
ALT: 16 IU/L (ref 0–32)
AST: 21 IU/L (ref 0–40)
Albumin: 4.7 g/dL (ref 3.8–4.8)
Alkaline Phosphatase: 88 IU/L (ref 44–121)
Bilirubin Total: 0.6 mg/dL (ref 0.0–1.2)
Bilirubin, Direct: 0.16 mg/dL (ref 0.00–0.40)
Total Protein: 7.2 g/dL (ref 6.0–8.5)

## 2020-07-14 ENCOUNTER — Ambulatory Visit: Payer: Self-pay

## 2020-07-14 ENCOUNTER — Ambulatory Visit (INDEPENDENT_AMBULATORY_CARE_PROVIDER_SITE_OTHER): Payer: Federal, State, Local not specified - PPO | Admitting: Family Medicine

## 2020-07-14 ENCOUNTER — Encounter: Payer: Self-pay | Admitting: Family Medicine

## 2020-07-14 ENCOUNTER — Other Ambulatory Visit: Payer: Self-pay

## 2020-07-14 VITALS — BP 112/70 | HR 80 | Ht 64.0 in | Wt 176.6 lb

## 2020-07-14 DIAGNOSIS — M25562 Pain in left knee: Secondary | ICD-10-CM

## 2020-07-14 DIAGNOSIS — G8929 Other chronic pain: Secondary | ICD-10-CM | POA: Diagnosis not present

## 2020-07-14 NOTE — Patient Instructions (Signed)
Thank you for coming in today.  We can do those gel shots.  Just let me know.   I do think talking with orthopedic surgery is worthwhile.  Let me know if you want to have a referral.   OK to continue exercise and activity.   OK to continue tylenol and voltaren gel and compression stocking.   Let me know if you want to move forward with any of the things we talked about.

## 2020-07-14 NOTE — Progress Notes (Signed)
I, [name], LAT, ATC, am serving as scribe for [name].  Lori [name] is a 69 y.o. female who presents to [place] at [facility] today for f/u of L knee pain.  She was last seen by [name] on 06/04/20 and was c/o con't L knee pain and L lower leg/ankle swelling and had a L knee injection.  Since her last visit, pt reports that her L knee remains relatively unchanged.  She states that she feels like she has to "walk on stilts."  She states that the L knee injection helped for 1-2 weeks but pain and swelling has returned.  She can only walk for about 30 min at this point vs walking 3-4 miles/day.  Diagnostic imaging: L knee MRI- 06/02/20; L LE venous doppler- 06/01/20   Pertinent review of systems: No fevers or chills  Relevant historical information: Baker's cyst left knee   Exam:  BP 112/70 (BP Location: Right Arm, Patient Position: Sitting, Cuff Size: Normal)   Pulse 80   Ht 5\' 4"  (1.626 m)   Wt 176 lb 9.6 oz (80.1 kg)   SpO2 99%   BMI 30.31 kg/m  General: Well Developed, well nourished, and in no acute distress.   MSK: Left knee small effusion otherwise normal-appearing Normal motion with crepitation.  Tender palpation medial joint line Stable ligamentous exam. Positive Murray's test.   Lab and Radiology Results Diagnostic Limited MSK Ultrasound of: Left knee Quad tendon intact normal-appearing Trace joint effusion.  Next patellar tendon normal-appearing Medial joint line narrowed degenerative.  Lateral joint line narrowed degenerative. Posterior knee small Baker's cyst. Impression: DJD.  Small Baker's cyst.  EXAM: MRI OF THE LEFT KNEE WITHOUT CONTRAST  TECHNIQUE: Multiplanar, multisequence MR imaging of the knee was performed. No intravenous contrast was administered.  COMPARISON:  04/02/2020 radiographs  FINDINGS: MENISCI  Medial meniscus: Large radial tear of the root of the posterior horn medial meniscus, image 14/9.  Amorphous degenerative signal in much of the remaining posterior horn and midbody questionable extension to the superior meniscal surface on image 9/10, potentially from additional degenerative tearing.  Lateral meniscus: Oblique grade 3 surface tear of the anterior horn with horizontal extension towards the free edge for example on images 20 through 22 of series 10. This tear likely extends into the anterior root.  LIGAMENTS  Cruciates:  Unremarkable  Collaterals: Mildly thickened proximal MCL with adjacent edema suggesting low-grade sprain  CARTILAGE  Patellofemoral: Moderate degenerative chondral thinning along the medial patellar facet. Chondral thinning centrally along the femoral trochlear groove (especially inferiorly) and along the medial femoral trochlear groove.  Medial: Moderate to prominent degenerative chondral thinning along the medial femoral condyle with moderate chondral thinning medially along the medial tibial plateau.  Lateral:  Mild marginal spurring.  Joint: Small knee effusion with associated synovitis. No current gas visible in the joint.  Popliteal Fossa: Ruptured Baker's cyst with some complexity or synovitis for example on image 16/6 and image 10/8. This could possibly reflect blood products. I favor that the plantaris is probably intact although some of the appearance resembles findings encountered in plantaris tear including fluid signal between the soleus medial head gastrocnemius.  Extensor Mechanism: Prepatellar subcutaneous edema, otherwise unremarkable.  Bones: No significant extra-articular osseous abnormalities identified.  Other: No supplemental non-categorized findings.  IMPRESSION: 1. Large radial tear of the root of the posterior horn medial meniscus. Probable adjacent degenerative tearing the posterior horn. 2. Oblique grade 3 surface tear of the anterior horn  lateral meniscus with horizontal extension towards  the free edge. This tear likely extends into the anterior root. 3. Mildly thickened proximal MCL with adjacent edema suggesting low-grade sprain. 4. Moderate to prominent degenerative chondral thinning in the medial compartment and medially in the patellofemoral joint. 5. Small knee effusion with associated synovitis. 6. Ruptured Baker's cyst with some complexity or synovitis. Suspect that the fluid signal between the lateral head gastrocnemius and soleus is probably from ruptured Baker's cyst rather than a plantaris tear.   Electronically Signed   By: [name]   On: 06/02/2020 10:32 I, [name], personally (independently) visualized and performed the interpretation of the images attached in this note. Agree with radiology read.    Assessment and Plan: 69 y.o. female with left knee pain due to DJD and meniscus tear.  Doubtful that the small Baker's cyst seen has much to do with her current pain.  Discussed treatment options.  Steroid injection done about a month ago lasted until recently.  Am not optimistic that future steroid injections will provide much more lasting relief.  Discussed that ultimately she will require total knee replacement however her medium DJD probably right now is not sufficient to proceed with total knee replacement.  Discussed that it is reasonable to discuss with orthopedic surgery for surgical options.  She would like to avoid that if possible for now.  Recommended trial of hyaluronic acid injection as next step.  She would like to continue doing what she is doing quad strengthening and activity as tolerated.  We will consider hyaluronic acid injections.   PDMP not reviewed this encounter. Orders Placed This Encounter  Procedures  . [place] LIMITED JOINT SPACE STRUCTURES LOW LEFT(NO LINKED CHARGES)    Order Specific Question:   Reason for Exam (SYMPTOM  OR DIAGNOSIS REQUIRED)    Answer:   L knee pain    Order Specific Question:   Preferred imaging  location?    Answer:   [place]   No orders of the defined types were placed in this encounter.    Discussed warning signs or symptoms. Please see discharge instructions. Patient expresses understanding.   The above documentation has been reviewed and is accurate and complete [name], M.D.  Total encounter time 20 minutes including face-to-face time with the patient and, reviewing past medical record, and charting on the date of service.   Time excludes time to perform the ultrasound

## 2020-07-17 ENCOUNTER — Other Ambulatory Visit: Payer: Self-pay

## 2020-07-17 MED ORDER — ROSUVASTATIN CALCIUM 20 MG PO TABS: 20.0000 mg | ORAL_TABLET | Freq: Every day | ORAL | 3 refills | Status: DC

## 2020-07-23 ENCOUNTER — Encounter: Payer: Self-pay | Admitting: Family Medicine

## 2020-07-23 ENCOUNTER — Other Ambulatory Visit: Payer: Self-pay

## 2020-07-23 ENCOUNTER — Ambulatory Visit (INDEPENDENT_AMBULATORY_CARE_PROVIDER_SITE_OTHER): Payer: Federal, State, Local not specified - PPO | Admitting: Family Medicine

## 2020-07-23 VITALS — BP 140/80 | HR 82 | Ht 64.0 in | Wt 176.6 lb

## 2020-07-23 DIAGNOSIS — S83232D Complex tear of medial meniscus, current injury, left knee, subsequent encounter: Secondary | ICD-10-CM | POA: Diagnosis not present

## 2020-07-23 DIAGNOSIS — M7122 Synovial cyst of popliteal space [Baker], left knee: Secondary | ICD-10-CM

## 2020-07-23 DIAGNOSIS — M25562 Pain in left knee: Secondary | ICD-10-CM | POA: Diagnosis not present

## 2020-07-23 MED ORDER — MELOXICAM 15 MG PO TABS
15.0000 mg | ORAL_TABLET | Freq: Every day | ORAL | 0 refills | Status: DC | PRN
Start: 2020-07-23 — End: 2020-12-02

## 2020-07-23 NOTE — Progress Notes (Signed)
I, [name], LAT, ATC, am serving as scribe for [name].  Lori [name] is a 69 y.o. female who presents to [place] at [facility] today for f/u of L knee pain that has worsened since earlier this week to the point that she is needing to walk on crutches.  She was last seen by [name] on 07/14/20 w/ con't L knee pain and swelling.  She was advised that referral to orthopedics is likely warranted at this point and was instructed to con't using Tylenol, Voltaren gel and a compression sleeve/sock.  Since her last visit, her L knee pain is worsening and is currently ambulating w/ B axillary crutches.  She has been icing and elevating her knee for the last 2 days.  Diagnostic testing: L knee MRI- 06/02/20; L knee XR- 04/02/20   Pertinent review of systems: No fevers or chills  Relevant historical information: Osteopenia and atherosclerosis.   Exam:  BP 140/80 (BP Location: Right Arm, Patient Position: Sitting, Cuff Size: Normal)   Pulse 82   Ht 5\' 4"  (1.626 m)   Wt 176 lb 9.4 oz (80.1 kg)   SpO2 98%   BMI 30.31 kg/m  General: Well Developed, well nourished, and in no acute distress.   MSK: Left knee decreased motion.  Range of motion 0-100 degrees.  Tender palpation medial and lateral joint line.  Antalgic gait.    Lab and Radiology Results EXAM: MRI OF THE LEFT KNEE WITHOUT CONTRAST  TECHNIQUE: Multiplanar, multisequence MR imaging of the knee was performed. No intravenous contrast was administered.  COMPARISON:  04/02/2020 radiographs  FINDINGS: MENISCI  Medial meniscus: Large radial tear of the root of the posterior horn medial meniscus, image 14/9. Amorphous degenerative signal in much of the remaining posterior horn and midbody questionable extension to the superior meniscal surface on image 9/10, potentially from additional degenerative tearing.  Lateral meniscus: Oblique grade 3 surface tear of the anterior horn with horizontal  extension towards the free edge for example on images 20 through 22 of series 10. This tear likely extends into the anterior root.  LIGAMENTS  Cruciates:  Unremarkable  Collaterals: Mildly thickened proximal MCL with adjacent edema suggesting low-grade sprain  CARTILAGE  Patellofemoral: Moderate degenerative chondral thinning along the medial patellar facet. Chondral thinning centrally along the femoral trochlear groove (especially inferiorly) and along the medial femoral trochlear groove.  Medial: Moderate to prominent degenerative chondral thinning along the medial femoral condyle with moderate chondral thinning medially along the medial tibial plateau.  Lateral:  Mild marginal spurring.  Joint: Small knee effusion with associated synovitis. No current gas visible in the joint.  Popliteal Fossa: Ruptured Baker's cyst with some complexity or synovitis for example on image 16/6 and image 10/8. This could possibly reflect blood products. I favor that the plantaris is probably intact although some of the appearance resembles findings encountered in plantaris tear including fluid signal between the soleus medial head gastrocnemius.  Extensor Mechanism: Prepatellar subcutaneous edema, otherwise unremarkable.  Bones: No significant extra-articular osseous abnormalities identified.  Other: No supplemental non-categorized findings.  IMPRESSION: 1. Large radial tear of the root of the posterior horn medial meniscus. Probable adjacent degenerative tearing the posterior horn. 2. Oblique grade 3 surface tear of the anterior horn lateral meniscus with horizontal extension towards the free edge. This tear likely extends into the anterior root. 3. Mildly thickened proximal MCL with adjacent edema suggesting low-grade sprain. 4. Moderate to prominent degenerative chondral thinning in the medial  compartment and medially in the patellofemoral joint. 5. Small knee  effusion with associated synovitis. 6. Ruptured Baker's cyst with some complexity or synovitis. Suspect that the fluid signal between the lateral head gastrocnemius and soleus is probably from ruptured Baker's cyst rather than a plantaris tear.   Electronically Signed   By: [name]   On: 06/02/2020 10:32     Assessment and Plan: 69 y.o. female with left knee pain due to meniscus tear and DJD. Patient suffered a knee injury participating in the senior games.  She has failed conservative management including steroid injections exercises and rest.  At this point I am not optimistic about hyaluronic acid injections.  I think is worthwhile having a surgical consultation.  I do not think a meniscus debridement is going to provide significant relief and she is probably a good candidate for total knee replacement at this point.  Will prescribe meloxicam to use with Tylenol for pain control.  Steroid injection last performed August 12  PDMP not reviewed this encounter. Orders Placed This Encounter  Procedures  . Ambulatory referral to Orthopedic Surgery    Referral Priority:   Routine    Referral Type:   Surgical    Referral Reason:   Specialty Services Required    Requested Specialty:   Orthopedic Surgery    Number of Visits Requested:   1   Meds ordered this encounter  Medications  . meloxicam (MOBIC) 15 MG tablet    Sig: Take 1 tablet (15 mg total) by mouth daily as needed for pain.    Dispense:  14 tablet    Refill:  0     Discussed warning signs or symptoms. Please see discharge instructions. Patient expresses understanding.   The above documentation has been reviewed and is accurate and complete [name], M.D.  Total encounter time 20 minutes including face-to-face time with the patient and, reviewing past medical record, and charting on the date of service.

## 2020-07-23 NOTE — Patient Instructions (Addendum)
Thank you for coming in today.  You should hear soon from orthopedic surgery about consultation.   Use the crutches as needed.   I am not optimistic about the gel shots.   Try the meloxicam daily for pain as needed. Ok to take with tylenol.

## 2020-07-30 ENCOUNTER — Ambulatory Visit: Payer: Federal, State, Local not specified - PPO | Admitting: Orthopaedic Surgery

## 2020-07-30 ENCOUNTER — Encounter: Payer: Self-pay | Admitting: Orthopaedic Surgery

## 2020-07-30 VITALS — Ht 64.0 in | Wt 176.0 lb

## 2020-07-30 DIAGNOSIS — M84452A Pathological fracture, left femur, initial encounter for fracture: Secondary | ICD-10-CM

## 2020-07-30 DIAGNOSIS — S83282A Other tear of lateral meniscus, current injury, left knee, initial encounter: Secondary | ICD-10-CM | POA: Insufficient documentation

## 2020-07-30 DIAGNOSIS — S83242A Other tear of medial meniscus, current injury, left knee, initial encounter: Secondary | ICD-10-CM | POA: Diagnosis not present

## 2020-07-30 NOTE — Progress Notes (Signed)
Office Visit Note   Patient: Lori [name]           Date of Birth: [birth year]           MRN: [id number] Visit Date: 07/30/2020              Requested by: Gregor Hams, MD [place],  [place] [address] PCP: Binnie Rail, MD   Assessment & Plan: Visit Diagnoses:  1. Closed subchondral insufficiency fracture of condyle of left femur (HCC)   2. Acute medial meniscus tear, left, initial encounter   3. Acute tear lateral meniscus, left, initial encounter     Plan: MRI of the left knee shows a subchondral edema and insufficiency fracture of the medial femoral condyle as well as large meniscal root tear of the medial meniscus as well as anterior horn lateral meniscus tear.  The medial meniscus is nonfunctional and extruded therefore causing the stress reaction of the medial femoral condyle.  She also has moderate chondromalacia in all 3 compartments.  These findings were reviewed with the patient in detail and we discussed the option of continued conservative treatment activity modification versus arthroscopic partial medial meniscectomy and lateral meniscectomy with chondroplasty and subchondroplasty of the medial femoral condyle.  Based on our discussion on risk benefits rehab recovery patient has elected to move forward with surgical treatment in the near future.  Questions encouraged and answered.  Follow-Up Instructions: Return for 2-week postop visit.   Orders:  No orders of the defined types were placed in this encounter.  No orders of the defined types were placed in this encounter.     Procedures: No procedures performed   Clinical Data: No additional findings.   Subjective: Chief Complaint  Patient presents with  . Left Knee - Pain    Phillip is a very pleasant and very active 69 year old female sent to [place] by [name] for chronic left knee pain for the last 6 months.  She injured it while she was participating in the senior games but she pushed through  it since then she has had significant pain to her left knee with weightbearing and with any activities beyond just normal basic walking for ADLs.  She has tried cortisone injections as well with temporary and partial relief.  She is currently taking meloxicam which does help but she understands that this is temporary.  She has also used crutches at times when the pain is severe.  Denies any numbness and tingling.  Denies any mechanical symptoms.   Review of Systems  Constitutional: Negative.   HENT: Negative.   Eyes: Negative.   Respiratory: Negative.   Cardiovascular: Negative.   Endocrine: Negative.   Musculoskeletal: Negative.   Neurological: Negative.   Hematological: Negative.   Psychiatric/Behavioral: Negative.   All other systems reviewed and are negative.    Objective: Vital Signs: Ht 5\' 4"  (1.626 m)   Wt 176 lb (79.8 kg)   BMI 30.21 kg/m   Physical Exam Vitals and nursing note reviewed.  Constitutional:      Appearance: She is well-developed.  HENT:     Head: Normocephalic and atraumatic.  Pulmonary:     Effort: Pulmonary effort is normal.  Abdominal:     Palpations: Abdomen is soft.  Musculoskeletal:     Cervical back: Neck supple.  Skin:    General: Skin is warm.     Capillary Refill: Capillary refill takes less than 2 seconds.  Neurological:     Mental  Status: She is alert and oriented to person, place, and time.  Psychiatric:        Behavior: Behavior normal.        Thought Content: Thought content normal.        Judgment: Judgment normal.     Ortho Exam Left knee shows a trace joint effusion.  Collaterals and cruciates are stable.  Small Baker's cyst.  Range of motion is painless and near normal with minimal crepitus.  Mild medial joint line tenderness and tenderness along the medial femoral condyle. Specialty Comments:  No specialty comments available.  Imaging: No results found.   PMFS History: Patient Active Problem List   Diagnosis Date  Noted  . Acute medial meniscus tear, left, initial encounter 07/30/2020  . Acute tear lateral meniscus, left, initial encounter 07/30/2020  . Closed subchondral insufficiency fracture of condyle of left femur ([place]) 07/30/2020  . Baker's cyst of knee, left 06/04/2020  . Atherosclerosis of right iliac artery 05/21/2020  . Arthritis of right knee 07/24/2019  . Left wrist pain 07/03/2019  . Acute pain of left shoulder 07/03/2019  . Headache 06/28/2019  . Chronic pain of right knee 05/22/2019  . Atypical chest pain 11/14/2018  . Stress at home 11/14/2018  . Hyperglycemia 05/08/2018  . Vertigo 04/17/2018  . Cough 11/21/2017  . Left arm numbness 11/21/2017  . Pyelonephritis 05/02/2017  . Hyperlipidemia 04/27/2017  . Personal history of colonic polyps 06/06/2014  . Family history of colon cancer 06/06/2014  . Osteopenia 11/04/2013   Past Medical History:  Diagnosis Date  . Acute pain of left shoulder 07/03/2019  . Arthritis of right knee 07/24/2019  . Atypical chest pain 11/14/2018  . Chronic pain of right knee 05/22/2019  . CTS (carpal tunnel syndrome)    chronic  . Depression 01/16/2013  . Family history of colon cancer 06/06/2014  . Headache 06/28/2019  . History of colon polyps   . Hyperglycemia 05/08/2018  . Hyperlipidemia 04/27/2017  . Left arm numbness 11/21/2017  . Left wrist pain 07/03/2019  . Osteopenia   . Personal history of colonic polyps 06/06/2014  . PONV (postoperative nausea and vomiting)   . Pyelonephritis 05/02/2017  . Stress at home 11/14/2018   stress w/dtr who has ovarian cancer 1/20  . Tubular adenoma   . UTI (urinary tract infection)   . Vertigo 04/17/2018    Family History  Problem Relation Age of Onset  . Bipolar disorder Sister   . Schizophrenia Sister   . Kidney disease Sister   . Hypertension Sister   . Bipolar disorder Sister   . Kidney disease Sister   . Hypertension Mother   . Mental illness Mother   . Dementia Mother   . Pancreatic cancer Mother   .  Cancer Father 46       colon; bladder  . Hypertension Father   . Heart disease Father   . Arthritis Father   . Prostate cancer Father   . Esophageal cancer Father   . Stomach cancer Father   . Colon cancer Father   . Kidney disease Father   . Bladder Cancer Father   . Ovarian cancer Daughter   . Bipolar disorder Son   . Liver cancer Neg Hx     Past Surgical History:  Procedure Laterality Date  . ABDOMINAL HYSTERECTOMY  1994   fibroids  . APPENDECTOMY    . BUNIONECTOMY Right   . BUNIONECTOMY Left 2018  . CHOLECYSTECTOMY    . COLONOSCOPY  12/09/2008  Dr.Rourk- normal rectum, polyp at the hepatic flexure bx= tubular adenoma. the remainder of the colonic mucosa and terminal ileum mucosa appeared normal.  . COLONOSCOPY  2006   Dr.Sam [place]- colonic adenoma  . COLONOSCOPY N/A 06/25/2014   Procedure: COLONOSCOPY;  Surgeon: Daneil Dolin, MD;  Location: [facility];  Service: Endoscopy;  Laterality: N/A;  1030 DR REQUEST TIME  . HEMORRHOID SURGERY    . KNEE ARTHROSCOPY Right    torn meniscus  . ORIF TOE FRACTURE Right   . rotator Cuff surgery    . TUBAL LIGATION     Social History   Occupational History  . Not on file  Tobacco Use  . Smoking status: Former Smoker    Packs/day: 1.00    Years: 10.00    Pack years: 10.00    Types: Cigarettes    Start date: 04/13/1971    Quit date: 11/01/1993    Years since quitting: 26.7  . Smokeless tobacco: Never Used  Vaping Use  . Vaping Use: Never used  Substance and Sexual Activity  . Alcohol use: Yes    Alcohol/week: 0.0 standard drinks    Comment: wine  . Drug use: No  . Sexual activity: Yes    Birth control/protection: Surgical

## 2020-08-04 ENCOUNTER — Other Ambulatory Visit: Payer: Self-pay | Admitting: Physician Assistant

## 2020-08-04 MED ORDER — ONDANSETRON HCL 4 MG PO TABS: 4.0000 mg | ORAL_TABLET | Freq: Three times a day (TID) | ORAL | 0 refills | Status: DC | PRN

## 2020-08-04 MED ORDER — HYDROCODONE-ACETAMINOPHEN 5-325 MG PO TABS: 1.0000 | ORAL_TABLET | Freq: Three times a day (TID) | ORAL | 0 refills | Status: DC | PRN

## 2020-08-05 ENCOUNTER — Encounter: Payer: Self-pay | Admitting: Orthopaedic Surgery

## 2020-08-06 ENCOUNTER — Other Ambulatory Visit: Payer: Self-pay | Admitting: Physician Assistant

## 2020-08-06 DIAGNOSIS — S83242A Other tear of medial meniscus, current injury, left knee, initial encounter: Secondary | ICD-10-CM

## 2020-08-06 DIAGNOSIS — M84452A Pathological fracture, left femur, initial encounter for fracture: Secondary | ICD-10-CM

## 2020-08-06 DIAGNOSIS — S83282A Other tear of lateral meniscus, current injury, left knee, initial encounter: Secondary | ICD-10-CM

## 2020-08-06 MED ORDER — TRAMADOL HCL 50 MG PO TABS: 50.0000 mg | ORAL_TABLET | Freq: Four times a day (QID) | ORAL | 0 refills | Status: DC | PRN

## 2020-08-07 ENCOUNTER — Other Ambulatory Visit: Payer: Self-pay | Admitting: Physician Assistant

## 2020-08-07 ENCOUNTER — Telehealth: Payer: Self-pay | Admitting: Orthopaedic Surgery

## 2020-08-07 MED ORDER — KETOROLAC TROMETHAMINE 10 MG PO TABS: 10.0000 mg | ORAL_TABLET | Freq: Two times a day (BID) | ORAL | 0 refills | Status: DC | PRN

## 2020-08-07 MED ORDER — HYDROMORPHONE HCL 2 MG PO TABS: 2.0000 mg | ORAL_TABLET | Freq: Three times a day (TID) | ORAL | 0 refills | Status: DC | PRN

## 2020-08-07 MED ORDER — HYDROMORPHONE HCL 2 MG PO TABS
2.0000 mg | ORAL_TABLET | Freq: Three times a day (TID) | ORAL | 0 refills | Status: DC | PRN
Start: 2020-08-07 — End: 2020-12-02

## 2020-08-07 MED ORDER — KETOROLAC TROMETHAMINE 10 MG PO TABS
10.0000 mg | ORAL_TABLET | Freq: Two times a day (BID) | ORAL | 0 refills | Status: DC | PRN
Start: 2020-08-07 — End: 2020-12-02

## 2020-08-07 NOTE — Telephone Encounter (Signed)
Thank you :)

## 2020-08-07 NOTE — Telephone Encounter (Signed)
Patient called requesting new pain medication. Patient states she is in severe pain and the hydrocodone and tramadol is not touching the pains. Please call patient about this matter and send new medication [facility] at [address]. Patient phone number is [phone].

## 2020-08-07 NOTE — Telephone Encounter (Signed)
Rx written and given to front desk

## 2020-08-07 NOTE — Telephone Encounter (Signed)
IC s/w patient and advised. Verbalized understanding.  

## 2020-08-07 NOTE — Telephone Encounter (Signed)
Can you let her know that I have called in dilaudid and toradol.  Make sure she knows not to take the dilaudid in addition, but instead of the tramadol/norco

## 2020-08-11 ENCOUNTER — Telehealth: Payer: Self-pay | Admitting: Radiology

## 2020-08-11 MED ORDER — OXYCODONE-ACETAMINOPHEN 5-325 MG PO TABS: 1.0000 | ORAL_TABLET | Freq: Three times a day (TID) | ORAL | 0 refills | Status: DC | PRN

## 2020-08-11 MED ORDER — KETOROLAC TROMETHAMINE 10 MG PO TABS: 10.0000 mg | ORAL_TABLET | Freq: Two times a day (BID) | ORAL | 0 refills | Status: DC | PRN

## 2020-08-11 NOTE — Telephone Encounter (Signed)
I called patient and advised. 

## 2020-08-11 NOTE — Telephone Encounter (Signed)
I sent in percocet and toradol. I'll give her a call later today.

## 2020-08-11 NOTE — Telephone Encounter (Signed)
Patient had surgery last Thursday and the pain continues to be just as bad as it was post op. She is still unable to put weight on her leg.  She states that [name] has changed her medications and she is still unable to get any relief. She is not scheduled to come back in to the office until 08/20/2020 for a post op visit. She does not know that she can make it until then in the pain that she is having. She would like to know what she should do.  Please advise. CB for patient is [phone]

## 2020-08-20 ENCOUNTER — Other Ambulatory Visit: Payer: Self-pay

## 2020-08-20 ENCOUNTER — Ambulatory Visit (INDEPENDENT_AMBULATORY_CARE_PROVIDER_SITE_OTHER): Payer: Federal, State, Local not specified - PPO | Admitting: Physician Assistant

## 2020-08-20 ENCOUNTER — Encounter: Payer: Self-pay | Admitting: Physician Assistant

## 2020-08-20 DIAGNOSIS — Z9889 Other specified postprocedural states: Secondary | ICD-10-CM

## 2020-08-20 NOTE — Progress Notes (Signed)
Post-Op Visit Note   Patient: Lori [name]           Date of Birth: [birth year]           MRN: [id number] Visit Date: 08/20/2020 PCP: Binnie Rail, MD   Assessment & Plan:  Chief Complaint:  Chief Complaint  Patient presents with  . Left Knee - Routine Post Op, Follow-up   Visit Diagnoses:  1. S/P left knee arthroscopy     Plan: Patient is a pleasant 69 year old female who comes in today 2 weeks out left knee arthroscopic debridement medial lateral meniscus as well as subchondroplasty.  She initially had increased pain which was hard to control with narcotics, but has been doing much better over the past week.  She is taking 1 narcotic pain pill twice daily.  She is trying to wean off of this.  No fevers or chills.  Examination of left knee reveals well-healed surgical portals without complication.  Calf soft nontender.  She is neurovascular intact distally.  At this point, sutures were removed and Steri-Strips applied.  Intraoperative pictures reviewed.  Range of motion and strengthening exercises were provided.  She will follow up with [place] in 4 weeks time for recheck.  Call with concerns or questions.  Follow-Up Instructions: Return in about 4 weeks (around 09/17/2020).   Orders:  No orders of the defined types were placed in this encounter.  No orders of the defined types were placed in this encounter.   Imaging: No new imaging  PMFS History: Patient Active Problem List   Diagnosis Date Noted  . Acute medial meniscus tear, left, initial encounter 07/30/2020  . Acute tear lateral meniscus, left, initial encounter 07/30/2020  . Closed subchondral insufficiency fracture of condyle of left femur ([place]) 07/30/2020  . Baker's cyst of knee, left 06/04/2020  . Atherosclerosis of right iliac artery 05/21/2020  . Arthritis of right knee 07/24/2019  . Left wrist pain 07/03/2019  . Acute pain of left shoulder 07/03/2019  . Headache 06/28/2019  . Chronic pain of right knee  05/22/2019  . Atypical chest pain 11/14/2018  . Stress at home 11/14/2018  . Hyperglycemia 05/08/2018  . Vertigo 04/17/2018  . Cough 11/21/2017  . Left arm numbness 11/21/2017  . Pyelonephritis 05/02/2017  . Hyperlipidemia 04/27/2017  . Personal history of colonic polyps 06/06/2014  . Family history of colon cancer 06/06/2014  . Osteopenia 11/04/2013   Past Medical History:  Diagnosis Date  . Acute pain of left shoulder 07/03/2019  . Arthritis of right knee 07/24/2019  . Atypical chest pain 11/14/2018  . Chronic pain of right knee 05/22/2019  . CTS (carpal tunnel syndrome)    chronic  . Depression 01/16/2013  . Family history of colon cancer 06/06/2014  . Headache 06/28/2019  . History of colon polyps   . Hyperglycemia 05/08/2018  . Hyperlipidemia 04/27/2017  . Left arm numbness 11/21/2017  . Left wrist pain 07/03/2019  . Osteopenia   . Personal history of colonic polyps 06/06/2014  . PONV (postoperative nausea and vomiting)   . Pyelonephritis 05/02/2017  . Stress at home 11/14/2018   stress w/dtr who has ovarian cancer 1/20  . Tubular adenoma   . UTI (urinary tract infection)   . Vertigo 04/17/2018    Family History  Problem Relation Age of Onset  . Bipolar disorder Sister   . Schizophrenia Sister   . Kidney disease Sister   . Hypertension Sister   . Bipolar disorder Sister   . Kidney  disease Sister   . Hypertension Mother   . Mental illness Mother   . Dementia Mother   . Pancreatic cancer Mother   . Cancer Father 75       colon; bladder  . Hypertension Father   . Heart disease Father   . Arthritis Father   . Prostate cancer Father   . Esophageal cancer Father   . Stomach cancer Father   . Colon cancer Father   . Kidney disease Father   . Bladder Cancer Father   . Ovarian cancer Daughter   . Bipolar disorder Son   . Liver cancer Neg Hx     Past Surgical History:  Procedure Laterality Date  . ABDOMINAL HYSTERECTOMY  1994   fibroids  . APPENDECTOMY    . BUNIONECTOMY  Right   . BUNIONECTOMY Left 2018  . CHOLECYSTECTOMY    . COLONOSCOPY  12/09/2008   Dr.Rourk- normal rectum, polyp at the hepatic flexure bx= tubular adenoma. the remainder of the colonic mucosa and terminal ileum mucosa appeared normal.  . COLONOSCOPY  2006   Dr.Sam [place]- colonic adenoma  . COLONOSCOPY N/A 06/25/2014   Procedure: COLONOSCOPY;  Surgeon: Daneil Dolin, MD;  Location: [facility];  Service: Endoscopy;  Laterality: N/A;  1030 DR REQUEST TIME  . HEMORRHOID SURGERY    . KNEE ARTHROSCOPY Right    torn meniscus  . ORIF TOE FRACTURE Right   . rotator Cuff surgery    . TUBAL LIGATION     Social History   Occupational History  . Not on file  Tobacco Use  . Smoking status: Former Smoker    Packs/day: 1.00    Years: 10.00    Pack years: 10.00    Types: Cigarettes    Start date: 04/13/1971    Quit date: 11/01/1993    Years since quitting: 26.8  . Smokeless tobacco: Never Used  Vaping Use  . Vaping Use: Never used  Substance and Sexual Activity  . Alcohol use: Yes    Alcohol/week: 0.0 standard drinks    Comment: wine  . Drug use: No  . Sexual activity: Yes    Birth control/protection: Surgical

## 2020-09-29 IMAGING — MR MR KNEE*L* W/O CM
7 series · 40 of 40 positions shown · non-contrast
Comparison: 04/02/2020 radiographs

CLINICAL DATA: Chronic knee pain

EXAM:
MRI OF THE LEFT KNEE WITHOUT CONTRAST
TECHNIQUE: Multiplanar, multisequence MR imaging of the knee was performed. No
intravenous contrast was administered.

[Series 6: T2 fat-sat · axial · 4.0mm · 0.53mm/px · z∈[-67,+78]mm · 6 of 30 slices shown (1 of 3)]
[im 1/30]
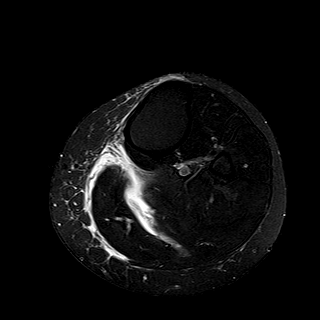
[im 6/30]
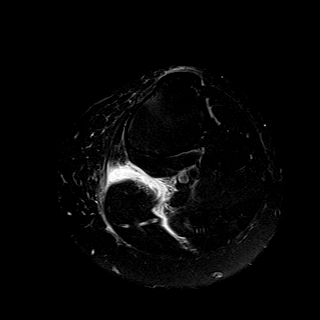
[im 12/30]
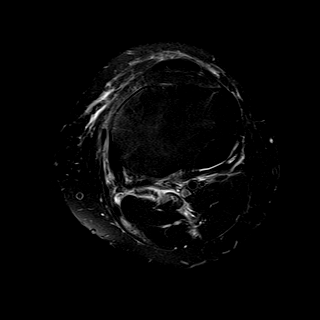
[im 18/30]
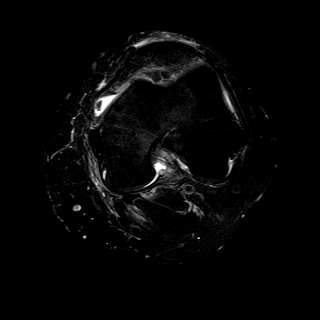
[im 24/30]
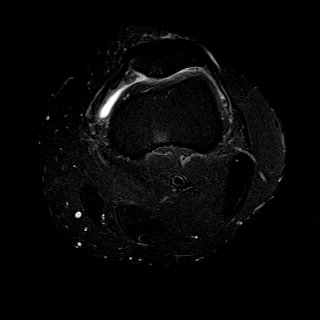
[im 30/30]
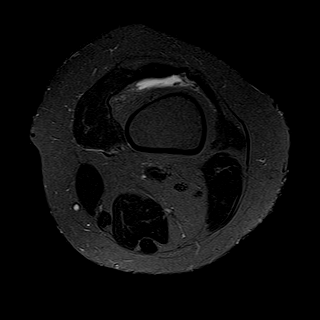

[Series 7: T1 · coronal · 4.0mm · 0.62mm/px · 6 of 29 slices shown]
[im 1/29]
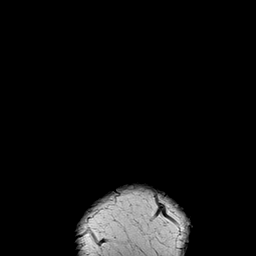
[im 6/29]
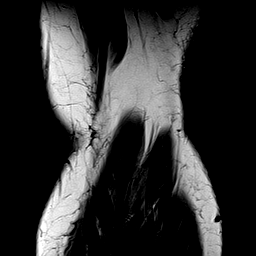
[im 12/29]
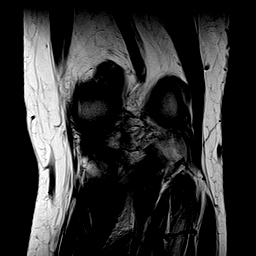
[im 17/29]
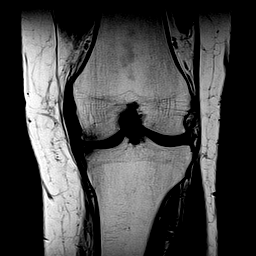
[im 23/29]
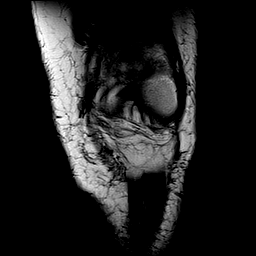
[im 29/29]
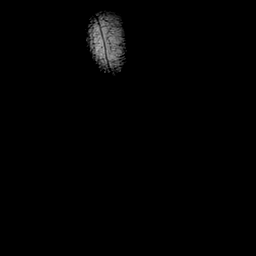

[Series 8: T2 fat-sat · coronal · 4.0mm · 0.62mm/px · 6 of 29 slices shown (2 of 3)]
[im 1/29]
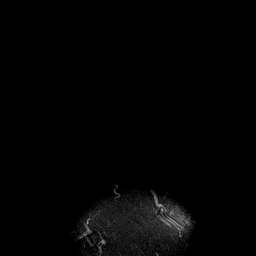
[im 6/29]
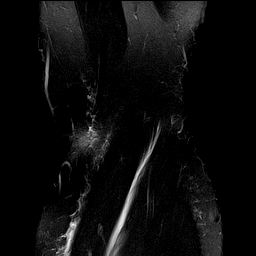
[im 12/29]
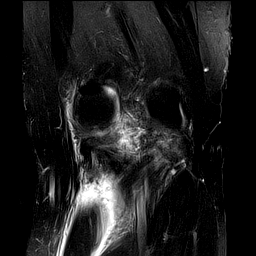
[im 17/29]
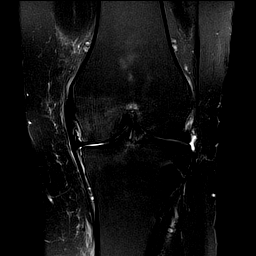
[im 23/29]
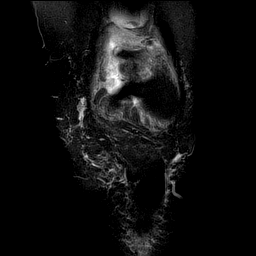
[im 29/29]
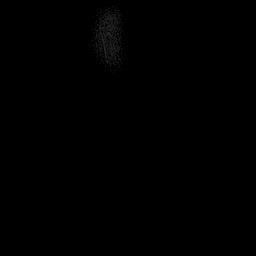

[Series 9: PD fat-sat · coronal · 4.0mm · 0.62mm/px · 6 of 29 slices shown (1 of 3)]
[im 1/29]
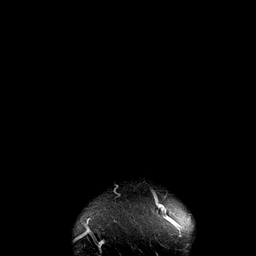
[im 6/29]
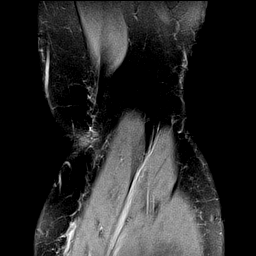
[im 12/29]
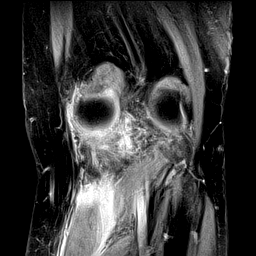
[im 17/29]
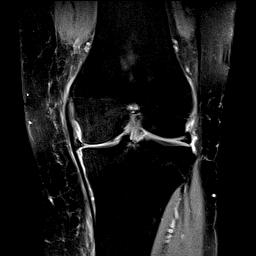
[im 23/29]
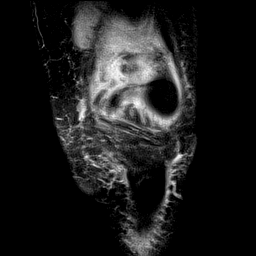
[im 29/29]
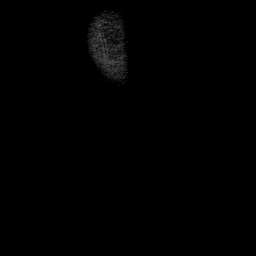

[Series 10: PD fat-sat · sagittal · 3.0mm · 0.62mm/px · 6 of 28 slices shown (2 of 3)]
[im 1/28]
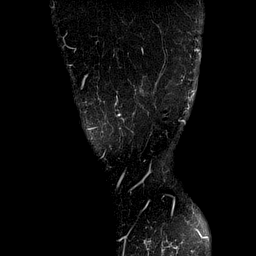
[im 6/28]
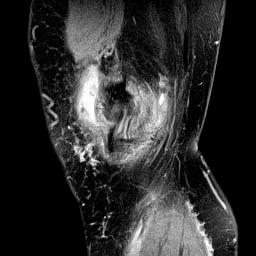
[im 11/28]
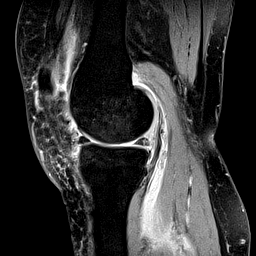
[im 17/28]
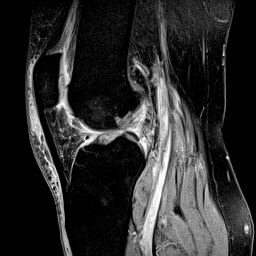
[im 22/28]
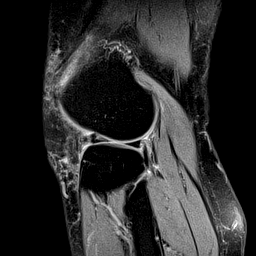
[im 28/28]
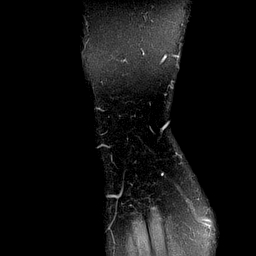

[Series 11: T2 fat-sat · sagittal · 3.0mm · 0.62mm/px · 6 of 28 slices shown (3 of 3)]
[im 1/28]
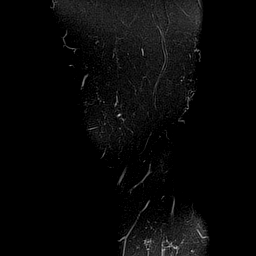
[im 6/28]
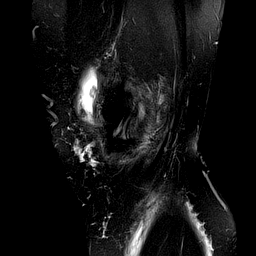
[im 11/28]
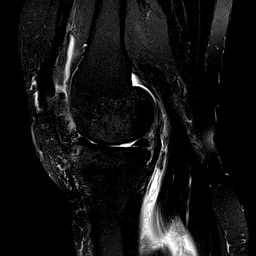
[im 17/28]
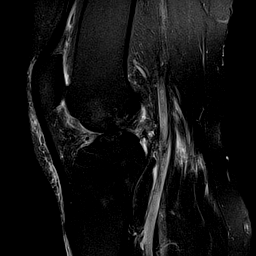
[im 22/28]
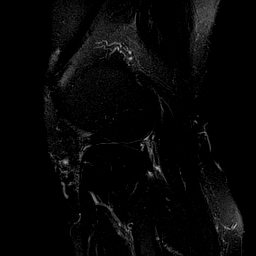
[im 28/28]
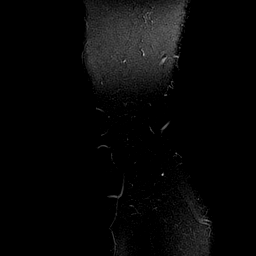

[Series 12: PD fat-sat · oblique · 2.0mm · 0.62mm/px · 4 of 19 slices shown (3 of 3)]
[im 1/19]
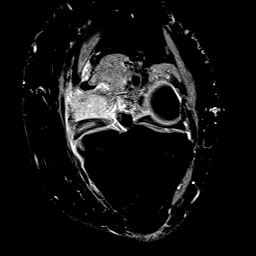
[im 7/19]
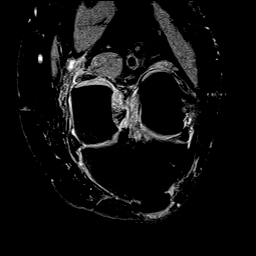
[im 13/19]
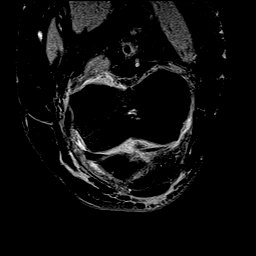
[im 19/19]
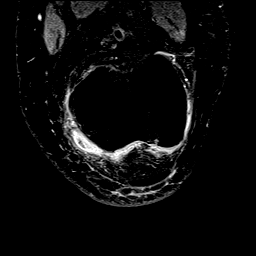

[40 of 40 positions shown; findings below may reference images not displayed]

FINDINGS: MENISCI

Medial meniscus: Large radial tear of the root of the posterior horn
medial meniscus, image [DATE]. Amorphous degenerative signal in much
of the remaining posterior horn and midbody questionable extension
to the superior meniscal surface on image [DATE], potentially from
additional degenerative tearing.

Lateral meniscus: Oblique grade 3 surface tear of the anterior horn
with horizontal extension towards the free edge for example on
images 20 through 22 of series 10. This tear likely extends into the
anterior root.

LIGAMENTS

Cruciates:  Unremarkable

Collaterals: Mildly thickened proximal MCL with adjacent edema
suggesting low-grade sprain

CARTILAGE

Patellofemoral: Moderate degenerative chondral thinning along the
medial patellar facet. Chondral thinning centrally along the femoral
trochlear groove (especially inferiorly) and along the medial
femoral trochlear groove.

Medial: Moderate to prominent degenerative chondral thinning along
the medial femoral condyle with moderate chondral thinning medially
along the medial tibial plateau.

Lateral:  Mild marginal spurring.

Joint: Small knee effusion with associated synovitis. No current gas
visible in the joint.

Popliteal Fossa: Ruptured Baker's cyst with some complexity or
synovitis for example on image [DATE] and image [DATE]. This could
possibly reflect blood products. I favor that the plantaris is
probably intact although some of the appearance resembles findings
encountered in plantaris tear including fluid signal between the
soleus medial head gastrocnemius.

Extensor Mechanism: Prepatellar subcutaneous edema, otherwise
unremarkable.

Bones: No significant extra-articular osseous abnormalities
identified.

Other: No supplemental non-categorized findings.
IMPRESSION: 1. Large radial tear of the root of the posterior horn medial
meniscus. Probable adjacent degenerative tearing the posterior horn.
2. Oblique grade 3 surface tear of the anterior horn lateral
meniscus with horizontal extension towards the free edge. This tear
likely extends into the anterior root.
3. Mildly thickened proximal MCL with adjacent edema suggesting
low-grade sprain.
4. Moderate to prominent degenerative chondral thinning in the
medial compartment and medially in the patellofemoral joint.
5. Small knee effusion with associated synovitis.
6. Ruptured Baker's cyst with some complexity or synovitis. Suspect
that the fluid signal between the lateral head gastrocnemius and
soleus is probably from ruptured Baker's cyst rather than a
plantaris tear.

## 2020-10-12 LAB — HM MAMMOGRAPHY

## 2020-12-01 NOTE — Progress Notes (Unsigned)
Subjective:    Patient ID: Lori [name], female    DOB: 13-Sep-1951, 70 y.o.   MRN: [id number]  HPI The patient is here for an acute visit.   Right arm and neck pain -  The pain started December.  She denies any activity that caused it.  It was initially the right upper arm.  It was painful to use the arm. She had a hard time laying on that side. It gradually moved up to her neck and posterior head.   Turning her head would hurt.  The ROM of her right shoulder was decreased, but it has improved.    She took Excedrin tension relief Monday and Tuesday and it did help  - it is a little better.      Medications and allergies reviewed with patient and updated if appropriate.  Patient Active Problem List   Diagnosis Date Noted  . Acute medial meniscus tear, left, initial encounter 07/30/2020  . Acute tear lateral meniscus, left, initial encounter 07/30/2020  . Closed subchondral insufficiency fracture of condyle of left femur ([place]) 07/30/2020  . Baker's cyst of knee, left 06/04/2020  . Atherosclerosis of right iliac artery 05/21/2020  . Arthritis of right knee 07/24/2019  . Left wrist pain 07/03/2019  . Acute pain of left shoulder 07/03/2019  . Headache 06/28/2019  . Chronic pain of right knee 05/22/2019  . Atypical chest pain 11/14/2018  . Stress at home 11/14/2018  . Hyperglycemia 05/08/2018  . Vertigo 04/17/2018  . Cough 11/21/2017  . Left arm numbness 11/21/2017  . Pyelonephritis 05/02/2017  . Hyperlipidemia 04/27/2017  . Personal history of colonic polyps 06/06/2014  . Family history of colon cancer 06/06/2014  . Osteopenia 11/04/2013    Current Outpatient Medications on File Prior to Visit  Medication Sig Dispense Refill  . ALPRAZolam (XANAX) 0.25 MG tablet Take 1 tablet (0.25 mg total) by mouth daily as needed for anxiety. 30 tablet 0  . Ascorbic Acid (VITAMIN C) 500 MG CHEW Chew 1 tablet by mouth daily.    Marland Kitchen b complex vitamins capsule Take 1 capsule by mouth daily.     . Calcium Carbonate-Vitamin D 600-400 MG-UNIT tablet Take 1 tablet by mouth daily.    . Cholecalciferol (VITAMIN D3) 50 MCG (2000 UT) TABS Take 2,000 Units by mouth daily.     . COD LIVER OIL PO Take 1 capsule by mouth daily.     Marland Kitchen conjugated estrogens (PREMARIN) vaginal cream Place 1 Applicatorful vaginally as needed.    Marland Kitchen estrogens, conjugated, (PREMARIN) 0.625 MG tablet Take 0.625 mg by mouth daily. Take daily for 21 days then do not take for 7 days.     . Ginkgo Biloba 60 MG CAPS Take 60 mg by mouth daily.     . Misc Natural Products (TART CHERRY ADVANCED) CAPS Take 1 capsule by mouth daily.     . Multiple Vitamins-Minerals (ONE-A-DAY WOMENS 50 PLUS PO) Take 1 tablet by mouth.     . Psyllium (METAMUCIL FIBER PO) Take 8 capsules by mouth. Patient takes up 8 capsules daily    . pyridOXINE (VITAMIN B-6) 100 MG tablet Take 100 mg by mouth daily.    . Rhubarb (ESTROVEN COMPLETE PO) Take 1 tablet by mouth daily.     . rosuvastatin (CRESTOR) 20 MG tablet Take 1 tablet (20 mg total) by mouth daily. 90 tablet 3  . Turmeric (QC TUMERIC COMPLEX PO) Take 1 tablet by mouth daily.     . vitamin  B-12 (CYANOCOBALAMIN) 500 MCG tablet Take 500 mcg by mouth daily.    . Vitamin E 180 MG CAPS Take 180 mg by mouth daily.      No current facility-administered medications on file prior to visit.    Past Medical History:  Diagnosis Date  . Acute pain of left shoulder 07/03/2019  . Arthritis of right knee 07/24/2019  . Atypical chest pain 11/14/2018  . Chronic pain of right knee 05/22/2019  . CTS (carpal tunnel syndrome)    chronic  . Depression 01/16/2013  . Family history of colon cancer 06/06/2014  . Headache 06/28/2019  . History of colon polyps   . Hyperglycemia 05/08/2018  . Hyperlipidemia 04/27/2017  . Left arm numbness 11/21/2017  . Left wrist pain 07/03/2019  . Osteopenia   . Personal history of colonic polyps 06/06/2014  . PONV (postoperative nausea and vomiting)   . Pyelonephritis 05/02/2017  . Stress  at home 11/14/2018   stress w/dtr who has ovarian cancer 1/20  . Tubular adenoma   . UTI (urinary tract infection)   . Vertigo 04/17/2018    Past Surgical History:  Procedure Laterality Date  . ABDOMINAL HYSTERECTOMY  1994   fibroids  . APPENDECTOMY    . BUNIONECTOMY Right   . BUNIONECTOMY Left 2018  . CHOLECYSTECTOMY    . COLONOSCOPY  12/09/2008   Dr.Rourk- normal rectum, polyp at the hepatic flexure bx= tubular adenoma. the remainder of the colonic mucosa and terminal ileum mucosa appeared normal.  . COLONOSCOPY  2006   Dr.Sam [place]- colonic adenoma  . COLONOSCOPY N/A 06/25/2014   Procedure: COLONOSCOPY;  Surgeon: Daneil Dolin, MD;  Location: [facility];  Service: Endoscopy;  Laterality: N/A;  1030 DR REQUEST TIME  . HEMORRHOID SURGERY    . KNEE ARTHROSCOPY Right    torn meniscus  . ORIF TOE FRACTURE Right   . rotator Cuff surgery    . TUBAL LIGATION      Social History   Socioeconomic History  . Marital status: Widowed    Spouse name: Not on file  . Number of children: 2  . Years of education: Not on file  . Highest education level: Not on file  Occupational History  . Not on file  Tobacco Use  . Smoking status: Former Smoker    Packs/day: 1.00    Years: 10.00    Pack years: 10.00    Types: Cigarettes    Start date: 04/13/1971    Quit date: 11/01/1993    Years since quitting: 27.1  . Smokeless tobacco: Never Used  Vaping Use  . Vaping Use: Never used  Substance and Sexual Activity  . Alcohol use: Yes    Alcohol/week: 0.0 standard drinks    Comment: wine  . Drug use: No  . Sexual activity: Yes    Birth control/protection: Surgical  Other Topics Concern  . Not on file  Social History Narrative   Exercising regularly - senior exercises, walking   Has 2 children.   Social Determinants of Health   Financial Resource Strain: Not on file  Food Insecurity: Not on file  Transportation Needs: Not on file  Physical Activity: Not on file  Stress: Not on  file  Social Connections: Not on file    Family History  Problem Relation Age of Onset  . Bipolar disorder Sister   . Schizophrenia Sister   . Kidney disease Sister   . Hypertension Sister   . Bipolar disorder Sister   . Kidney  disease Sister   . Hypertension Mother   . Mental illness Mother   . Dementia Mother   . Pancreatic cancer Mother   . Cancer Father 74       colon; bladder  . Hypertension Father   . Heart disease Father   . Arthritis Father   . Prostate cancer Father   . Esophageal cancer Father   . Stomach cancer Father   . Colon cancer Father   . Kidney disease Father   . Bladder Cancer Father   . Ovarian cancer Daughter   . Bipolar disorder Son   . Liver cancer Neg Hx     Review of Systems  Constitutional: Negative for fever.  Musculoskeletal: Positive for arthralgias, neck pain and neck stiffness.  Skin: Negative for rash.  Neurological: Negative for weakness (in hand) and numbness.       Objective:   Vitals:   12/02/20 1325  BP: 126/80  Pulse: 80  Temp: 98.1 F (36.7 C)  SpO2: 97%   BP Readings from Last 3 Encounters:  12/02/20 126/80  07/23/20 140/80  07/14/20 112/70   Wt Readings from Last 3 Encounters:  12/02/20 174 lb (78.9 kg)  07/30/20 176 lb (79.8 kg)  07/23/20 176 lb 9.4 oz (80.1 kg)   Body mass index is 29.87 kg/m.   Physical Exam Constitutional:      General: She is not in acute distress.    Appearance: Normal appearance. She is not ill-appearing.  Musculoskeletal:        General: Tenderness (Minimal tenderness bilateral trapezius muscles, slight increased pain with movement of head, but normal range of motion) present. No swelling or deformity.     Comments: Right shoulder with minimally decreased range of motion-pain with some movements  Neurological:     Mental Status: She is alert.     Sensory: No sensory deficit.     Motor: No weakness.            Assessment & Plan:    See Problem List for Assessment and  Plan of chronic medical problems.    This visit occurred during the SARS-CoV-2 public health emergency.  Safety protocols were in place, including screening questions prior to the visit, additional usage of staff PPE, and extensive cleaning of exam room while observing appropriate contact time as indicated for disinfecting solutions.

## 2020-12-02 ENCOUNTER — Ambulatory Visit: Payer: Federal, State, Local not specified - PPO | Admitting: Internal Medicine

## 2020-12-02 ENCOUNTER — Encounter: Payer: Self-pay | Admitting: Internal Medicine

## 2020-12-02 ENCOUNTER — Other Ambulatory Visit: Payer: Self-pay

## 2020-12-02 VITALS — BP 126/80 | HR 80 | Temp 98.1°F | Ht 64.0 in | Wt 174.0 lb

## 2020-12-02 DIAGNOSIS — S46819A Strain of other muscles, fascia and tendons at shoulder and upper arm level, unspecified arm, initial encounter: Secondary | ICD-10-CM | POA: Diagnosis not present

## 2020-12-02 DIAGNOSIS — M25511 Pain in right shoulder: Secondary | ICD-10-CM

## 2020-12-02 MED ORDER — METHOCARBAMOL 500 MG PO TABS: 500.0000 mg | ORAL_TABLET | Freq: Three times a day (TID) | ORAL | 0 refills | Status: DC | PRN

## 2020-12-02 NOTE — Patient Instructions (Addendum)
Take the methocarbamol (muscle relaxer) for your neck pain, muscle tightness.    You can take Excedrin as needed.     If your shoulder pain does not improve please let me know.

## 2020-12-02 NOTE — Assessment & Plan Note (Signed)
Acute Bilateral trapezius muscle strain, discomfort Has improved Discussed that this is muscular in nature and not related to her arm/shoulder pain Has improved some, but still symptomatic Continue heat and over-the-counter pain medication if needed Trial of methocarbamol 500 mg 3 times daily as needed Call if no improvement

## 2020-12-02 NOTE — Assessment & Plan Note (Addendum)
Acute Right shoulder pain and decreased range of motion-started couple of months ago for no obvious reason There has been improvement Continue ibuprofen as needed Continue gentle range of motion If there is no improvement she will let me know and I will refer to sports medicine

## 2021-04-27 NOTE — Progress Notes (Signed)
Subjective:    Patient ID: Lori [name], female    DOB: 09/07/1951, 70 y.o.   MRN: [id number]  HPI The patient is here for an acute visit.  Rash under left breast - 3 weeks.  It started with one bump under the breast and now she has several very small bumps.  It is so itchy she can not stop itching it.  When she does itch the area becomes red and she feels like it is spreading.  No rash under right breast.  She has tried Neosporin, calamine, cortisone w/o improvement.     Medications and allergies reviewed with patient and updated if appropriate.  Patient Active Problem List   Diagnosis Date Noted   Trapezius muscle strain 12/02/2020   Acute medial meniscus tear, left, initial encounter 07/30/2020   Acute tear lateral meniscus, left, initial encounter 07/30/2020   Closed subchondral insufficiency fracture of condyle of left femur ([place]) 07/30/2020   Baker's cyst of knee, left 06/04/2020   Atherosclerosis of right iliac artery 05/21/2020   Arthritis of right knee 07/24/2019   Left wrist pain 07/03/2019   Right shoulder pain 07/03/2019   Headache 06/28/2019   Chronic pain of right knee 05/22/2019   Atypical chest pain 11/14/2018   Stress at home 11/14/2018   Hyperglycemia 05/08/2018   Vertigo 04/17/2018   Cough 11/21/2017   Left arm numbness 11/21/2017   Pyelonephritis 05/02/2017   Hyperlipidemia 04/27/2017   Personal history of colonic polyps 06/06/2014   Family history of colon cancer 06/06/2014   Osteopenia 11/04/2013    Current Outpatient Medications on File Prior to Visit  Medication Sig Dispense Refill   ALPRAZolam (XANAX) 0.25 MG tablet Take 1 tablet (0.25 mg total) by mouth daily as needed for anxiety. 30 tablet 0   Ascorbic Acid (VITAMIN C) 500 MG CHEW Chew 1 tablet by mouth daily.     b complex vitamins capsule Take 1 capsule by mouth daily.     Calcium Carbonate-Vitamin D 600-400 MG-UNIT tablet Take 1 tablet by mouth daily.     Cholecalciferol (VITAMIN D3) 50  MCG (2000 UT) TABS Take 2,000 Units by mouth daily.      COD LIVER OIL PO Take 1 capsule by mouth daily.      conjugated estrogens (PREMARIN) vaginal cream Place 1 Applicatorful vaginally as needed.     estrogens, conjugated, (PREMARIN) 0.625 MG tablet Take 0.625 mg by mouth daily. Take daily for 21 days then do not take for 7 days.      Ginkgo Biloba 60 MG CAPS Take 60 mg by mouth daily.      methocarbamol (ROBAXIN) 500 MG tablet Take 1 tablet (500 mg total) by mouth every 8 (eight) hours as needed for muscle spasms. 20 tablet 0   Misc Natural Products (TART CHERRY ADVANCED) CAPS Take 1 capsule by mouth daily.      Multiple Vitamins-Minerals (ONE-A-DAY WOMENS 50 PLUS PO) Take 1 tablet by mouth.      Psyllium (METAMUCIL FIBER PO) Take 8 capsules by mouth. Patient takes up 8 capsules daily     pyridOXINE (VITAMIN B-6) 100 MG tablet Take 100 mg by mouth daily.     Rhubarb (ESTROVEN COMPLETE PO) Take 1 tablet by mouth daily.      rosuvastatin (CRESTOR) 20 MG tablet Take 1 tablet (20 mg total) by mouth daily. 90 tablet 3   Turmeric (QC TUMERIC COMPLEX PO) Take 1 tablet by mouth daily.      vitamin B-12 (  CYANOCOBALAMIN) 500 MCG tablet Take 500 mcg by mouth daily.     Vitamin E 180 MG CAPS Take 180 mg by mouth daily.      Testosterone POWD      No current facility-administered medications on file prior to visit.    Past Medical History:  Diagnosis Date   Acute pain of left shoulder 07/03/2019   Arthritis of right knee 07/24/2019   Atypical chest pain 11/14/2018   Chronic pain of right knee 05/22/2019   CTS (carpal tunnel syndrome)    chronic   Depression 01/16/2013   Family history of colon cancer 06/06/2014   Headache 06/28/2019   History of colon polyps    Hyperglycemia 05/08/2018   Hyperlipidemia 04/27/2017   Left arm numbness 11/21/2017   Left wrist pain 07/03/2019   Osteopenia    Personal history of colonic polyps 06/06/2014   PONV (postoperative nausea and vomiting)    Pyelonephritis 05/02/2017    Stress at home 11/14/2018   stress w/dtr who has ovarian cancer 1/20   Tubular adenoma    UTI (urinary tract infection)    Vertigo 04/17/2018    Past Surgical History:  Procedure Laterality Date   ABDOMINAL HYSTERECTOMY  1994   fibroids   APPENDECTOMY     BUNIONECTOMY Right    BUNIONECTOMY Left 2018   CHOLECYSTECTOMY     COLONOSCOPY  12/09/2008   Dr.Rourk- normal rectum, polyp at the hepatic flexure bx= tubular adenoma. the remainder of the colonic mucosa and terminal ileum mucosa appeared normal.   COLONOSCOPY  2006   Dr.Sam Rathdrum- colonic adenoma   COLONOSCOPY N/A 06/25/2014   Procedure: COLONOSCOPY;  Surgeon: Daneil Dolin, MD;  Location: [facility];  Service: Endoscopy;  Laterality: N/A;  15 DR REQUEST TIME   HEMORRHOID SURGERY     KNEE ARTHROSCOPY Right    torn meniscus   ORIF TOE FRACTURE Right    rotator Cuff surgery     TUBAL LIGATION      Social History   Socioeconomic History   Marital status: Widowed    Spouse name: Not on file   Number of children: 2   Years of education: Not on file   Highest education level: Not on file  Occupational History   Not on file  Tobacco Use   Smoking status: Former    Packs/day: 1.00    Years: 10.00    Pack years: 10.00    Types: Cigarettes    Start date: 04/13/1971    Quit date: 11/01/1993    Years since quitting: 27.5   Smokeless tobacco: Never  Vaping Use   Vaping Use: Never used  Substance and Sexual Activity   Alcohol use: Yes    Alcohol/week: 0.0 standard drinks    Comment: wine   Drug use: No   Sexual activity: Yes    Birth control/protection: Surgical  Other Topics Concern   Not on file  Social History Narrative   Exercising regularly - senior exercises, walking   Has 2 children.   Social Determinants of Health   Financial Resource Strain: Not on file  Food Insecurity: Not on file  Transportation Needs: Not on file  Physical Activity: Not on file  Stress: Not on file  Social Connections: Not on  file    Family History  Problem Relation Age of Onset   Bipolar disorder Sister    Schizophrenia Sister    Kidney disease Sister    Hypertension Sister    Bipolar disorder Sister  Kidney disease Sister    Hypertension Mother    Mental illness Mother    Dementia Mother    Pancreatic cancer Mother    Cancer Father 29       colon; bladder   Hypertension Father    Heart disease Father    Arthritis Father    Prostate cancer Father    Esophageal cancer Father    Stomach cancer Father    Colon cancer Father    Kidney disease Father    Bladder Cancer Father    Ovarian cancer Daughter    Bipolar disorder Son    Liver cancer Neg Hx     Review of Systems     Objective:   Vitals:   04/28/21 1433  BP: 120/70  Pulse: 81  Temp: 98.7 F (37.1 C)  SpO2: 99%   BP Readings from Last 3 Encounters:  04/28/21 120/70  12/02/20 126/80  07/23/20 140/80   Wt Readings from Last 3 Encounters:  04/28/21 171 lb 12.8 oz (77.9 kg)  12/02/20 174 lb (78.9 kg)  07/30/20 176 lb (79.8 kg)   Body mass index is 29.49 kg/m.   Physical Exam Constitutional:      General: She is not in acute distress.    Appearance: Normal appearance. She is not ill-appearing.  HENT:     Head: Normocephalic and atraumatic.  Skin:    General: Skin is warm and dry.     Findings: Rash (one papule under left breast with several surrounding very small papules, minimal erythema but she has been itching.  no skin breakdown) present.  Neurological:     Mental Status: She is alert.           Assessment & Plan:    See Problem List for Assessment and Plan of chronic medical problems.    This visit occurred during the SARS-CoV-2 public health emergency.  Safety protocols were in place, including screening questions prior to the visit, additional usage of staff PPE, and extensive cleaning of exam room while observing appropriate contact time as indicated for disinfecting solutions.

## 2021-04-28 ENCOUNTER — Encounter: Payer: Self-pay | Admitting: Internal Medicine

## 2021-04-28 ENCOUNTER — Other Ambulatory Visit: Payer: Self-pay

## 2021-04-28 ENCOUNTER — Ambulatory Visit: Payer: Federal, State, Local not specified - PPO | Admitting: Internal Medicine

## 2021-04-28 DIAGNOSIS — B3789 Other sites of candidiasis: Secondary | ICD-10-CM | POA: Diagnosis not present

## 2021-04-28 MED ORDER — CLOTRIMAZOLE-BETAMETHASONE 1-0.05 % EX CREA: 1.0000 "application " | TOPICAL_CREAM | Freq: Two times a day (BID) | CUTANEOUS | 0 refills | Status: DC

## 2021-04-28 NOTE — Patient Instructions (Addendum)
    Medications changes include :   lotrisone cream twice daily for your rash   Your prescription(s) have been submitted to your pharmacy. Please take as directed and contact our office if you believe you are having problem(s) with the medication(s).   Please call if there is no improvement in your symptoms.

## 2021-04-28 NOTE — Assessment & Plan Note (Signed)
Acute Mild rash x 3 week under left breast only - no improvement or spreading Start lotrisone cream bid   She will call if there is no improvement by 5 days from now so we can try something else

## 2021-04-30 NOTE — Progress Notes (Deleted)
   I, Peterson Lombard, LAT, ATC acting as a scribe for [name], MD.  Lori [name] is a 69 y.o. female who presents to [place] at [facility] today for L knee pain x/. MOI: Pt struck knee on a chair. Pt was previously seen by [name] on 07/23/20 for L knee pain and was advised to proceed to surgical consultation and was prescribed meloxicam.  Pt had a L knee arthroscopy in Oct 2021. Today, pt locates pain to   R knee swelling: Treatments tried:  Dx testing: 06/02/20 L knee MRI  06/01/21 L venous doppler US  04/02/20 L knee XR  Pertinent review of systems: ***  Relevant historical information: ***   Exam:  There were no vitals taken for this visit. General: Well Developed, well nourished, and in no acute distress.   MSK: ***    Lab and Radiology Results No results found for this or any previous visit (from the past 72 hour(s)). No results found.     Assessment and Plan: 70 y.o. female with ***   PDMP not reviewed this encounter. No orders of the defined types were placed in this encounter.  No orders of the defined types were placed in this encounter.    Discussed warning signs or symptoms. Please see discharge instructions. Patient expresses understanding.   ***

## 2021-05-03 ENCOUNTER — Ambulatory Visit: Payer: Federal, State, Local not specified - PPO | Admitting: Family Medicine

## 2021-05-05 NOTE — Progress Notes (Signed)
I, [name], LAT, ATC, am serving as scribe for [name].  Lori [name] is a 70 y.o. female who presents to [place] at [facility] today for f/u of L knee pain.  She was last seen by [name] on 07/23/20 w/ worsening L knee pain and was referred to Orthopedics and prescribed Meloxicam.  Since her last visit, pt reports her knee had been doing good, however on 7/1 she hit her patella/knee on the corner of a chair. Pt notes her knee isn't painful, but doesn't feel right. Pt reports mechanical symptoms and discomfort w/ knee extension.  She notes a painful click or pop in the left lateral knee near the patella with knee extension.  Diagnostic imaging: L knee MRI- 06/02/20; L knee XR- 04/02/20  Pertinent review of systems: No fevers or chills  Relevant historical information: History medial and lateral meniscus debridement and as well as some chondroplasty in October 2021 left knee   Exam:  BP 128/80 (BP Location: Right Arm, Patient Position: Sitting, Cuff Size: Normal)   Pulse 90   Ht 5\' 4"  (1.626 m)   Wt 171 lb (77.6 kg)   SpO2 98%   BMI 29.35 kg/m  General: Well Developed, well nourished, and in no acute distress.   MSK: Left knee no effusion. Mature portal scars. Normal motion with palpable click present lateral patella. Intact strength however some pain with resisted knee extension present. Stable ligamentous exam.    Lab and Radiology Results  Procedure: Real-time Ultrasound Guided Injection of left knee area of impingement lateral to the patella and patellofemoral joint and into the knee joint. Device: Philips Affiniti 50G Images permanently stored and available for review in PACS Ultrasound evaluation prior to injection reveals minimal joint effusion intact quad and patellar tendons.  Degenerative medial and lateral joint lines. Dynamic soft tissue impingement seen at lateral patellofemoral joint with knee extension in the area of palpable  click. Verbal informed consent obtained.  Discussed risks and benefits of procedure. Warned about infection bleeding damage to structures skin hypopigmentation and fat atrophy among others. Patient expresses understanding and agreement Time-out conducted.   Noted no overlying erythema, induration, or other signs of local infection.   Skin prepped in a sterile fashion.   Local anesthesia: Topical Ethyl chloride.   With sterile technique and under real time ultrasound guidance: 40 mg of Kenalog and 2 ml of Marcaine injected into soft tissue impingement and into the knee joint lateral patellofemoral joint. Fluid seen entering the soft tissue and joint.   Completed without difficulty   Pain immediately resolved suggesting accurate placement of the medication.   Advised to call if fevers/chills, erythema, induration, drainage, or persistent bleeding.   Images permanently stored and available for review in the ultrasound unit.  Impression: Technically successful ultrasound guided injection.    X-ray images left knee obtained today personally and independently interpreted Sclerosis femoral condyle.  Patellofemoral DJD.  No acute fractures. Await formal radiology review    Assessment and Plan: 70 y.o. female with left knee pain after anterior knee contact and collision.  Patient has dynamic soft tissue impingement at the lateral patellofemoral joint probably from the impact on the anterior aspect of the knee.  Plan for injection of the impinging soft tissue directly in clinic today as well as intra-articular injection.  Additionally recommend Voltaren gel and relative rest.  Gradual return to activity.  Recheck if not improving.   PDMP not reviewed this encounter. Orders Placed  This Encounter  Procedures   [place] LIMITED JOINT SPACE STRUCTURES LOW LEFT(NO LINKED CHARGES)    Standing Status:   Future    Number of Occurrences:   1    Standing Expiration Date:   11/06/2021    Order Specific  Question:   Reason for Exam (SYMPTOM  OR DIAGNOSIS REQUIRED)    Answer:   left knee pain    Order Specific Question:   Preferred imaging location?    Answer:   [facility]   DG Knee AP/LAT W/[facility] Left    Standing Status:   Future    Number of Occurrences:   1    Standing Expiration Date:   05/06/2022    Order Specific Question:   Reason for Exam (SYMPTOM  OR DIAGNOSIS REQUIRED)    Answer:   left knee pain    Order Specific Question:   Preferred imaging location?    Answer:   [facility]   No orders of the defined types were placed in this encounter.    Discussed warning signs or symptoms. Please see discharge instructions. Patient expresses understanding.   The above documentation has been reviewed and is accurate and complete [name], M.D.

## 2021-05-06 ENCOUNTER — Ambulatory Visit: Payer: Self-pay

## 2021-05-06 ENCOUNTER — Ambulatory Visit: Payer: Federal, State, Local not specified - PPO | Admitting: Family Medicine

## 2021-05-06 ENCOUNTER — Ambulatory Visit (INDEPENDENT_AMBULATORY_CARE_PROVIDER_SITE_OTHER): Payer: Federal, State, Local not specified - PPO

## 2021-05-06 ENCOUNTER — Other Ambulatory Visit: Payer: Self-pay

## 2021-05-06 VITALS — BP 128/80 | HR 90 | Ht 64.0 in | Wt 171.0 lb

## 2021-05-06 DIAGNOSIS — M25562 Pain in left knee: Secondary | ICD-10-CM

## 2021-05-06 NOTE — Patient Instructions (Addendum)
Thank you for coming in today.   Please get an Xray today before you leave.   Call or go to the ER if you develop a large red swollen joint with extreme pain or oozing puss.    Take easy for a week or so.   Please use Voltaren gel (Generic Diclofenac Gel) up to 4x daily for pain as needed.  This is available over-the-counter as both the name brand Voltaren gel and the generic diclofenac gel.   Recheck as needed.

## 2021-05-10 ENCOUNTER — Telehealth: Payer: Self-pay

## 2021-05-10 NOTE — Telephone Encounter (Signed)
[facility] radiology called stating patient's xray results are in and MRI is recommended

## 2021-05-11 NOTE — Progress Notes (Signed)
Right knee x-ray shows some arthritis.  The radiologist does talk about concern for stress fracture.  However based on prior MRI we know that there already was a stress fracture there so I do not think this is a new issue and I do not think it needs to be looked into further with an MRI.  I talked to the radiologist about this yesterday.

## 2021-05-24 NOTE — Progress Notes (Signed)
Subjective:    Patient ID: Lori [name], female    DOB: 08-14-1951, 70 y.o.   MRN: [id number]   This visit occurred during the SARS-CoV-2 public health emergency.  Safety protocols were in place, including screening questions prior to the visit, additional usage of staff PPE, and extensive cleaning of exam room while observing appropriate contact time as indicated for disinfecting solutions.    HPI She is here for a physical exam.   Her breast rash resolved with cream.  Medications and allergies reviewed with patient and updated if appropriate.  Patient Active Problem List   Diagnosis Date Noted   Candidiasis of breast 04/28/2021   Acute medial meniscus tear, left, initial encounter 07/30/2020   Acute tear lateral meniscus, left, initial encounter 07/30/2020   Closed subchondral insufficiency fracture of condyle of left femur ([place]) 07/30/2020   Baker's cyst of knee, left 06/04/2020   Atherosclerosis of right iliac artery 05/21/2020   Arthritis of right knee 07/24/2019   Right shoulder pain 07/03/2019   Headache 06/28/2019   Atypical chest pain 11/14/2018   Stress at home 11/14/2018   Hyperglycemia 05/08/2018   Vertigo 04/17/2018   Cough 11/21/2017   Left arm numbness 11/21/2017   Pyelonephritis 05/02/2017   Hyperlipidemia 04/27/2017   Personal history of colonic polyps 06/06/2014   Family history of colon cancer 06/06/2014   Osteopenia 11/04/2013    Current Outpatient Medications on File Prior to Visit  Medication Sig Dispense Refill   Ascorbic Acid (VITAMIN C) 500 MG CHEW Chew 1 tablet by mouth daily.     b complex vitamins capsule Take 1 capsule by mouth daily.     Calcium Carbonate-Vitamin D 600-400 MG-UNIT tablet Take 1 tablet by mouth daily.     Cholecalciferol (VITAMIN D3) 50 MCG (2000 UT) TABS Take 2,000 Units by mouth daily.      COD LIVER OIL PO Take 1 capsule by mouth daily.      conjugated estrogens (PREMARIN) vaginal cream Place 1 Applicatorful  vaginally as needed.     Ginkgo Biloba 60 MG CAPS Take 60 mg by mouth daily.      methocarbamol (ROBAXIN) 500 MG tablet Take 1 tablet (500 mg total) by mouth every 8 (eight) hours as needed for muscle spasms. 20 tablet 0   Misc Natural Products (TART CHERRY ADVANCED) CAPS Take 1 capsule by mouth daily.      Multiple Vitamins-Minerals (ONE-A-DAY WOMENS 50 PLUS PO) Take 1 tablet by mouth.      Psyllium (METAMUCIL FIBER PO) Take 8 capsules by mouth. Patient takes up 8 capsules daily     pyridOXINE (VITAMIN B-6) 100 MG tablet Take 100 mg by mouth daily.     Rhubarb (ESTROVEN COMPLETE PO) Take 1 tablet by mouth daily.      rosuvastatin (CRESTOR) 20 MG tablet Take 1 tablet (20 mg total) by mouth daily. 90 tablet 3   Turmeric (QC TUMERIC COMPLEX PO) Take 1 tablet by mouth daily.      vitamin B-12 (CYANOCOBALAMIN) 500 MCG tablet Take 500 mcg by mouth daily.     Vitamin E 180 MG CAPS Take 180 mg by mouth daily.      No current facility-administered medications on file prior to visit.    Past Medical History:  Diagnosis Date   Acute pain of left shoulder 07/03/2019   Arthritis of right knee 07/24/2019   Atypical chest pain 11/14/2018   Chronic pain of right knee 05/22/2019   CTS (carpal tunnel  syndrome)    chronic   Depression 01/16/2013   Family history of colon cancer 06/06/2014   Headache 06/28/2019   History of colon polyps    Hyperglycemia 05/08/2018   Hyperlipidemia 04/27/2017   Left arm numbness 11/21/2017   Left wrist pain 07/03/2019   Osteopenia    Personal history of colonic polyps 06/06/2014   PONV (postoperative nausea and vomiting)    Pyelonephritis 05/02/2017   Stress at home 11/14/2018   stress w/dtr who has ovarian cancer 1/20   Tubular adenoma    UTI (urinary tract infection)    Vertigo 04/17/2018    Past Surgical History:  Procedure Laterality Date   ABDOMINAL HYSTERECTOMY  1994   fibroids   APPENDECTOMY     BUNIONECTOMY Right    BUNIONECTOMY Left 2018   CHOLECYSTECTOMY      COLONOSCOPY  12/09/2008   Dr.Rourk- normal rectum, polyp at the hepatic flexure bx= tubular adenoma. the remainder of the colonic mucosa and terminal ileum mucosa appeared normal.   COLONOSCOPY  2006   Dr.Sam [place]- colonic adenoma   COLONOSCOPY N/A 06/25/2014   Procedure: COLONOSCOPY;  Surgeon: Daneil Dolin, MD;  Location: [facility];  Service: Endoscopy;  Laterality: N/A;  23 DR REQUEST TIME   HEMORRHOID SURGERY     KNEE ARTHROSCOPY Right    torn meniscus   ORIF TOE FRACTURE Right    rotator Cuff surgery     TUBAL LIGATION      Social History   Socioeconomic History   Marital status: Widowed    Spouse name: Not on file   Number of children: 2   Years of education: Not on file   Highest education level: Not on file  Occupational History   Not on file  Tobacco Use   Smoking status: Former    Packs/day: 1.00    Years: 10.00    Pack years: 10.00    Types: Cigarettes    Start date: 04/13/1971    Quit date: 11/01/1993    Years since quitting: 27.5   Smokeless tobacco: Never  Vaping Use   Vaping Use: Never used  Substance and Sexual Activity   Alcohol use: Yes    Alcohol/week: 0.0 standard drinks    Comment: wine   Drug use: No   Sexual activity: Yes    Birth control/protection: Surgical  Other Topics Concern   Not on file  Social History Narrative   Exercising regularly - senior exercises, walking   Has 2 children.   Social Determinants of Health   Financial Resource Strain: Not on file  Food Insecurity: Not on file  Transportation Needs: Not on file  Physical Activity: Not on file  Stress: Not on file  Social Connections: Not on file    Family History  Problem Relation Age of Onset   Bipolar disorder Sister    Schizophrenia Sister    Kidney disease Sister    Hypertension Sister    Bipolar disorder Sister    Kidney disease Sister    Hypertension Mother    Mental illness Mother    Dementia Mother    Pancreatic cancer Mother    Cancer Father 5        colon; bladder   Hypertension Father    Heart disease Father    Arthritis Father    Prostate cancer Father    Esophageal cancer Father    Stomach cancer Father    Colon cancer Father    Kidney disease Father  Bladder Cancer Father    Ovarian cancer Daughter    Bipolar disorder Son    Liver cancer Neg Hx     Review of Systems  Constitutional:  Negative for chills and fever.  Eyes:  Negative for visual disturbance.  Respiratory:  Negative for cough, shortness of breath and wheezing.   Cardiovascular:  Negative for chest pain and leg swelling.  Gastrointestinal:  Negative for abdominal pain, blood in stool, constipation, diarrhea and nausea.       No gerd  Genitourinary:  Negative for dysuria and hematuria.  Musculoskeletal:  Negative for arthralgias.  Skin:  Negative for color change and rash.  Neurological:  Negative for light-headedness and headaches.  Psychiatric/Behavioral:  Negative for dysphoric mood. The patient is not nervous/anxious.       Objective:   Vitals:   05/25/21 0839  BP: 120/74  Pulse: 71  Temp: 98.4 F (36.9 C)  SpO2: 98%   Filed Weights   05/25/21 0839  Weight: 170 lb (77.1 kg)   Body mass index is 29.18 kg/m.  BP Readings from Last 3 Encounters:  05/25/21 120/74  05/06/21 128/80  04/28/21 120/70    Wt Readings from Last 3 Encounters:  05/25/21 170 lb (77.1 kg)  05/06/21 171 lb (77.6 kg)  04/28/21 171 lb 12.8 oz (77.9 kg)     Physical Exam Constitutional: She appears well-developed and well-nourished. No distress.  HENT:  Head: Normocephalic and atraumatic.  Right Ear: External ear normal. Normal ear canal and TM Left Ear: External ear normal.  Normal ear canal and TM Mouth/Throat: Oropharynx is clear and moist.  Eyes: Conjunctivae and EOM are normal.  Neck: Neck supple. No tracheal deviation present. No thyromegaly present.  No carotid bruit  Cardiovascular: Normal rate, regular rhythm and normal heart sounds.   No murmur  heard.  No edema. Pulmonary/Chest: Effort normal and breath sounds normal. No respiratory distress. She has no wheezes. She has no rales.  Breast: deferred   Abdominal: Soft. She exhibits no distension. There is no tenderness.  Lymphadenopathy: She has no cervical adenopathy.  Skin: Skin is warm and dry. She is not diaphoretic.  Psychiatric: She has a normal mood and affect. Her behavior is normal.     Lab Results  Component Value Date   WBC 3.6 (L) 05/22/2020   HGB 14.6 05/22/2020   HCT 43.0 05/22/2020   PLT 268 05/22/2020   GLUCOSE 101 (H) 05/04/2020   CHOL 239 (H) 07/08/2020   TRIG 72 07/08/2020   HDL 89 07/08/2020   LDLCALC 138 (H) 07/08/2020   ALT 16 07/08/2020   AST 21 07/08/2020   NA 141 05/04/2020   K 3.9 05/04/2020   CL 104 05/04/2020   CREATININE 0.85 05/04/2020   BUN 14 05/04/2020   CO2 24 05/04/2020   TSH 1.03 05/22/2020   HGBA1C 5.4 05/22/2020         Assessment & Plan:   Physical exam: Screening blood work  ordered Exercise  walking Weight  ok for age Substance abuse  none   Dexa ordered  Reviewed recommended immunizations.   Health Maintenance  Topic Date Due   PNA vac Low Risk Adult (1 of 2 - PCV13) Never done   DEXA SCAN  06/21/2018   COVID-19 Vaccine (3 - Moderna risk series) 02/18/2020   MAMMOGRAM  09/24/2020   INFLUENZA VACCINE  05/24/2021   Zoster Vaccines- Shingrix (1 of 2) 08/25/2021 (Originally 04/12/1970)   COLONOSCOPY (Pts 45-10yr Insurance coverage will need to  be confirmed)  10/01/2024   TETANUS/TDAP  05/08/2028   Hepatitis C Screening  Completed   HPV VACCINES  Aged Out          See Problem List for Assessment and Plan of chronic medical problems.

## 2021-05-24 NOTE — Patient Instructions (Addendum)
Blood work was ordered.     Medications changes include :   none     Please followup in 1 year    Health Maintenance, Female Adopting a healthy lifestyle and getting preventive care are important in promoting health and wellness. Ask your health care provider about: The right schedule for you to have regular tests and exams. Things you can do on your own to prevent diseases and keep yourself healthy. What should I know about diet, weight, and exercise? Eat a healthy diet  Eat a diet that includes plenty of vegetables, fruits, low-fat dairy products, and lean protein. Do not eat a lot of foods that are high in solid fats, added sugars, or sodium.  Maintain a healthy weight Body mass index (BMI) is used to identify weight problems. It estimates body fat based on height and weight. Your health care provider can help determineyour BMI and help you achieve or maintain a healthy weight. Get regular exercise Get regular exercise. This is one of the most important things you can do for your health. Most adults should: Exercise for at least 150 minutes each week. The exercise should increase your heart rate and make you sweat (moderate-intensity exercise). Do strengthening exercises at least twice a week. This is in addition to the moderate-intensity exercise. Spend less time sitting. Even light physical activity can be beneficial. Watch cholesterol and blood lipids Have your blood tested for lipids and cholesterol at 68 years of age, then havethis test every 5 years. Have your cholesterol levels checked more often if: Your lipid or cholesterol levels are high. You are older than 68 years of age. You are at high risk for heart disease. What should I know about cancer screening? Depending on your health history and family history, you may need to have cancer screening at various ages. This may include screening for: Breast cancer. Cervical cancer. Colorectal cancer. Skin cancer. Lung  cancer. What should I know about heart disease, diabetes, and high blood pressure? Blood pressure and heart disease High blood pressure causes heart disease and increases the risk of stroke. This is more likely to develop in people who have high blood pressure readings, are of African descent, or are overweight. Have your blood pressure checked: Every 3-5 years if you are 18-39 years of age. Every year if you are 40 years old or older. Diabetes Have regular diabetes screenings. This checks your fasting blood sugar level. Have the screening done: Once every three years after age 40 if you are at a normal weight and have a low risk for diabetes. More often and at a younger age if you are overweight or have a high risk for diabetes. What should I know about preventing infection? Hepatitis B If you have a higher risk for hepatitis B, you should be screened for this virus. Talk with your health care provider to find out if you are at risk forhepatitis B infection. Hepatitis C Testing is recommended for: Everyone born from 1945 through 1965. Anyone with known risk factors for hepatitis C. Sexually transmitted infections (STIs) Get screened for STIs, including gonorrhea and chlamydia, if: You are sexually active and are younger than 68 years of age. You are older than 68 years of age and your health care provider tells you that you are at risk for this type of infection. Your sexual activity has changed since you were last screened, and you are at increased risk for chlamydia or gonorrhea. Ask your health care provider if you are   at risk. Ask your health care provider about whether you are at high risk for HIV. Your health care provider may recommend a prescription medicine to help prevent HIV infection. If you choose to take medicine to prevent HIV, you should first get tested for HIV. You should then be tested every 3 months for as long as you are taking the medicine. Pregnancy If you are about  to stop having your period (premenopausal) and you may become pregnant, seek counseling before you get pregnant. Take 400 to 800 micrograms (mcg) of folic acid every day if you become pregnant. Ask for birth control (contraception) if you want to prevent pregnancy. Osteoporosis and menopause Osteoporosis is a disease in which the bones lose minerals and strength with aging. This can result in bone fractures. If you are 65 years old or older, or if you are at risk for osteoporosis and fractures, ask your health care provider if you should: Be screened for bone loss. Take a calcium or vitamin D supplement to lower your risk of fractures. Be given hormone replacement therapy (HRT) to treat symptoms of menopause. Follow these instructions at home: Lifestyle Do not use any products that contain nicotine or tobacco, such as cigarettes, e-cigarettes, and chewing tobacco. If you need help quitting, ask your health care provider. Do not use street drugs. Do not share needles. Ask your health care provider for help if you need support or information about quitting drugs. Alcohol use Do not drink alcohol if: Your health care provider tells you not to drink. You are pregnant, may be pregnant, or are planning to become pregnant. If you drink alcohol: Limit how much you use to 0-1 drink a day. Limit intake if you are breastfeeding. Be aware of how much alcohol is in your drink. In the [place] one drink equals one 12 oz bottle of beer (355 mL), one 5 oz glass of wine (148 mL), or one 1 oz glass of hard liquor (44 mL). General instructions Schedule regular health, dental, and eye exams. Stay current with your vaccines. Tell your health care provider if: You often feel depressed. You have ever been abused or do not feel safe at home. Summary Adopting a healthy lifestyle and getting preventive care are important in promoting health and wellness. Follow your health care provider's instructions about  healthy diet, exercising, and getting tested or screened for diseases. Follow your health care provider's instructions on monitoring your cholesterol and blood pressure. This information is not intended to replace advice given to you by your health care provider. Make sure you discuss any questions you have with your healthcare provider. Document Revised: 10/03/2018 Document Reviewed: 10/03/2018 Elsevier Patient Education  2022 [employer]  

## 2021-05-25 ENCOUNTER — Other Ambulatory Visit: Payer: Self-pay

## 2021-05-25 ENCOUNTER — Ambulatory Visit (INDEPENDENT_AMBULATORY_CARE_PROVIDER_SITE_OTHER): Payer: Federal, State, Local not specified - PPO | Admitting: Internal Medicine

## 2021-05-25 ENCOUNTER — Encounter: Payer: Self-pay | Admitting: Internal Medicine

## 2021-05-25 VITALS — BP 120/74 | HR 71 | Temp 98.4°F | Ht 64.0 in | Wt 170.0 lb

## 2021-05-25 DIAGNOSIS — E782 Mixed hyperlipidemia: Secondary | ICD-10-CM | POA: Diagnosis not present

## 2021-05-25 DIAGNOSIS — R739 Hyperglycemia, unspecified: Secondary | ICD-10-CM

## 2021-05-25 DIAGNOSIS — M8589 Other specified disorders of bone density and structure, multiple sites: Secondary | ICD-10-CM

## 2021-05-25 DIAGNOSIS — Z0000 Encounter for general adult medical examination without abnormal findings: Secondary | ICD-10-CM | POA: Diagnosis not present

## 2021-05-25 LAB — CBC WITH DIFFERENTIAL/PLATELET
Basophils Absolute: 0 10*3/uL (ref 0.0–0.1)
Basophils Relative: 1.1 % (ref 0.0–3.0)
Eosinophils Absolute: 0.1 10*3/uL (ref 0.0–0.7)
Eosinophils Relative: 1.8 % (ref 0.0–5.0)
HCT: 41.7 % (ref 36.0–46.0)
Hemoglobin: 14.2 g/dL (ref 12.0–15.0)
Lymphocytes Relative: 39.3 % (ref 12.0–46.0)
Lymphs Abs: 1.3 10*3/uL (ref 0.7–4.0)
MCHC: 34.1 g/dL (ref 30.0–36.0)
MCV: 92.3 fl (ref 78.0–100.0)
Monocytes Absolute: 0.4 10*3/uL (ref 0.1–1.0)
Monocytes Relative: 11.6 % (ref 3.0–12.0)
Neutro Abs: 1.5 10*3/uL (ref 1.4–7.7)
Neutrophils Relative %: 46.2 % (ref 43.0–77.0)
Platelets: 224 10*3/uL (ref 150.0–400.0)
RBC: 4.51 Mil/uL (ref 3.87–5.11)
RDW: 13.6 % (ref 11.5–15.5)
WBC: 3.2 10*3/uL — ABNORMAL LOW (ref 4.0–10.5)

## 2021-05-25 LAB — LIPID PANEL
Cholesterol: 200 mg/dL (ref 0–200)
HDL: 81.3 mg/dL (ref >39.00)
LDL Cholesterol: 103 mg/dL — ABNORMAL HIGH (ref 0–99)
NonHDL: 118.58
Total CHOL/HDL Ratio: 2
Triglycerides: 80 mg/dL (ref 0.0–149.0)
VLDL: 16 mg/dL (ref 0.0–40.0)

## 2021-05-25 LAB — COMPREHENSIVE METABOLIC PANEL
ALT: 19 U/L (ref 0–35)
AST: 23 U/L (ref 0–37)
Albumin: 4.5 g/dL (ref 3.5–5.2)
Alkaline Phosphatase: 69 U/L (ref 39–117)
BUN: 13 mg/dL (ref 6–23)
CO2: 28 mEq/L (ref 19–32)
Calcium: 9.4 mg/dL (ref 8.4–10.5)
Chloride: 104 mEq/L (ref 96–112)
Creatinine, Ser: 0.86 mg/dL (ref 0.40–1.20)
GFR: 69.56 mL/min (ref >60.00)
Glucose, Bld: 100 mg/dL — ABNORMAL HIGH (ref 70–99)
Potassium: 4.3 mEq/L (ref 3.5–5.1)
Sodium: 140 mEq/L (ref 135–145)
Total Bilirubin: 1.1 mg/dL (ref 0.2–1.2)
Total Protein: 7.2 g/dL (ref 6.0–8.3)

## 2021-05-25 LAB — TSH: TSH: 1.45 u[IU]/mL (ref 0.35–5.50)

## 2021-05-25 LAB — HEMOGLOBIN A1C: Hgb A1c MFr Bld: 5.5 % (ref 4.6–6.5)

## 2021-05-25 NOTE — Assessment & Plan Note (Signed)
Chronic Check a1c Low sugar / carb diet Stressed regular exercise  

## 2021-05-25 NOTE — Addendum Note (Signed)
Addended by: Jacobo Forest on: 05/25/2021 09:09 AM   Modules accepted: Orders

## 2021-05-25 NOTE — Assessment & Plan Note (Signed)
Chronic dexa due - ordered Continue regular exercise Continue calcium and vitamin d daily

## 2021-05-25 NOTE — Assessment & Plan Note (Signed)
Chronic Check lipid panel  Continue crestor 20 mg qd Regular exercise and healthy diet encouraged  

## 2021-06-09 NOTE — Progress Notes (Signed)
Cardiology Office Note    Date:  06/16/2021   ID:  [name], [name] 1951/07/09, MRN [id number]   PCP:  Binnie Rail, MD   [place]  Cardiologist:  Candee Furbish, MD   Advanced Practice Provider:  No care team member to display Electrophysiologist:  None   [phone]   Chief Complaint  Patient presents with   Follow-up     History of Present Illness:  Lori [name] is a 70 y.o. female with history of HLD and remote tobacco abuse, chest pain with coronary CTA 02/13/2020 showed calcium score of 0 and no evidence of CAD with mild aortic atherosclerosis.  Patient was last seen in our office 05/04/2020 by Ms. Lori Peers, NP and was doing well without chest pain.  Patient comes in for f/u. No further chest pain. Doing well.Walks 1 hr in am and stationary bike in evening. Hurt knee running in senior games last year and had surgery.   Past Medical History:  Diagnosis Date   Acute pain of left shoulder 07/03/2019   Arthritis of right knee 07/24/2019   Atypical chest pain 11/14/2018   Chronic pain of right knee 05/22/2019   CTS (carpal tunnel syndrome)    chronic   Depression 01/16/2013   Family history of colon cancer 06/06/2014   Headache 06/28/2019   History of colon polyps    Hyperglycemia 05/08/2018   Hyperlipidemia 04/27/2017   Left arm numbness 11/21/2017   Left wrist pain 07/03/2019   Osteopenia    Personal history of colonic polyps 06/06/2014   PONV (postoperative nausea and vomiting)    Pyelonephritis 05/02/2017   Stress at home 11/14/2018   stress w/dtr who has ovarian cancer 1/20   Tubular adenoma    UTI (urinary tract infection)    Vertigo 04/17/2018    Past Surgical History:  Procedure Laterality Date   ABDOMINAL HYSTERECTOMY  1994   fibroids   APPENDECTOMY     BUNIONECTOMY Right    BUNIONECTOMY Left 2018   CHOLECYSTECTOMY     COLONOSCOPY  12/09/2008   Dr.Rourk- normal rectum, polyp at the hepatic flexure bx= tubular adenoma. the remainder of  the colonic mucosa and terminal ileum mucosa appeared normal.   COLONOSCOPY  2006   Dr.Sam [place]- colonic adenoma   COLONOSCOPY N/A 06/25/2014   Procedure: COLONOSCOPY;  Surgeon: Daneil Dolin, MD;  [facility];  Service: Endoscopy;  Laterality: N/A;  1030 DR REQUEST TIME   HEMORRHOID SURGERY     KNEE ARTHROSCOPY Right    torn meniscus   ORIF TOE FRACTURE Right    rotator Cuff surgery     TUBAL LIGATION      Current Medications: Current Meds  Medication Sig   Ascorbic Acid (VITAMIN C) 500 MG CHEW Chew 1 tablet by mouth daily.   b complex vitamins capsule Take 1 capsule by mouth daily.   Calcium Carbonate-Vitamin D 600-400 MG-UNIT tablet Take 1 tablet by mouth daily.   Cholecalciferol (VITAMIN D3) 50 MCG (2000 UT) TABS Take 2,000 Units by mouth daily.    COD LIVER OIL PO Take 1 capsule by mouth daily.    conjugated estrogens (PREMARIN) vaginal cream Place 1 Applicatorful vaginally as needed.   Ginkgo Biloba 60 MG CAPS Take 60 mg by mouth daily.    Misc Natural Products (TART CHERRY ADVANCED) CAPS Take 1 capsule by mouth daily.    Multiple Vitamins-Minerals (ONE-A-DAY WOMENS 50 PLUS PO) Take 1 tablet by mouth.  Psyllium (METAMUCIL FIBER PO) Take 8 capsules by mouth. Patient takes up 8 capsules daily   pyridOXINE (VITAMIN B-6) 100 MG tablet Take 100 mg by mouth daily.   Rhubarb (ESTROVEN COMPLETE PO) Take 1 tablet by mouth daily.    rosuvastatin (CRESTOR) 20 MG tablet Take 1 tablet (20 mg total) by mouth daily.   Turmeric (QC TUMERIC COMPLEX PO) Take 1 tablet by mouth daily.    vitamin B-12 (CYANOCOBALAMIN) 500 MCG tablet Take 500 mcg by mouth daily.   Vitamin E 180 MG CAPS Take 180 mg by mouth daily.      Allergies:   Codeine, Penicillins, and Tramadol   Social History   Socioeconomic History   Marital status: Widowed    Spouse name: Not on file   Number of children: 2   Years of education: Not on file   Highest education level: Not on file  Occupational  History   Not on file  Tobacco Use   Smoking status: Former    Packs/day: 1.00    Years: 10.00    Pack years: 10.00    Types: Cigarettes    Start date: 04/13/1971    Quit date: 11/01/1993    Years since quitting: 27.6   Smokeless tobacco: Never  Vaping Use   Vaping Use: Never used  Substance and Sexual Activity   Alcohol use: Yes    Alcohol/week: 0.0 standard drinks    Comment: wine   Drug use: No   Sexual activity: Yes    Birth control/protection: Surgical  Other Topics Concern   Not on file  Social History Narrative   Exercising regularly - senior exercises, walking   Has 2 children.   Social Determinants of Health   Financial Resource Strain: Not on file  Food Insecurity: Not on file  Transportation Needs: Not on file  Physical Activity: Not on file  Stress: Not on file  Social Connections: Not on file     Family History:  The patient's  family history includes Arthritis in her father; Bipolar disorder in her sister, sister, and son; Bladder Cancer in her father; Cancer (age of onset: 31) in her father; Colon cancer in her father; Dementia in her mother; Esophageal cancer in her father; Heart disease in her father; Hypertension in her father, mother, and sister; Kidney disease in her father, sister, and sister; Mental illness in her mother; Ovarian cancer in her daughter; Pancreatic cancer in her mother; Prostate cancer in her father; Schizophrenia in her sister; Stomach cancer in her father.   ROS:   Please see the history of present illness.    ROS All other systems reviewed and are negative.   PHYSICAL EXAM:   VS:  BP 120/62   Pulse 60   Ht '5\' 4"'$  (1.626 m)   Wt 171 lb (77.6 kg)   SpO2 98%   BMI 29.35 kg/m   Physical Exam  GEN: Well nourished, well developed, in no acute distress  Neck: no JVD, carotid bruits, or masses Cardiac:RRR; no murmurs, rubs, or gallops  Respiratory:  clear to auscultation bilaterally, normal work of breathing GI: soft, nontender,  nondistended, + BS Ext: without cyanosis, clubbing, or edema, Good distal pulses bilaterally Neuro:  Alert and Oriented x 3 Psych: euthymic mood, full affect  Wt Readings from Last 3 Encounters:  06/16/21 171 lb (77.6 kg)  05/25/21 170 lb (77.1 kg)  05/06/21 171 lb (77.6 kg)      Studies/Labs Reviewed:   EKG:  EKG is  ordered today.  The ekg ordered today demonstrates NSR, normal EKG  Recent Labs: 05/25/2021: ALT 19; BUN 13; Creatinine, Ser 0.86; Hemoglobin 14.2; Platelets 224.0; Potassium 4.3; Sodium 140; TSH 1.45   Lipid Panel    Component Value Date/Time   CHOL 200 05/25/2021 0909   CHOL 239 (H) 07/08/2020 0830   TRIG 80.0 05/25/2021 0909   HDL 81.30 05/25/2021 0909   HDL 89 07/08/2020 0830   CHOLHDL 2 05/25/2021 0909   VLDL 16.0 05/25/2021 0909   LDLCALC 103 (H) 05/25/2021 0909   LDLCALC 138 (H) 07/08/2020 0830    Additional studies/ records that were reviewed today include:  CCTA 02/13/20:   IMPRESSION: 1. Coronary calcium score of 0. This was 0 percentile for age and sex matched control.   2. Normal coronary origin with right dominance.   3. No evidence of CAD.  CADRADS 0.   4. Mild aortic atherosclerosis.     Risk Assessment/Calculations:         ASSESSMENT:    1. Atypical chest pain   2. Hyperlipidemia, unspecified hyperlipidemia type   3. Aortic atherosclerosis (HCC)      PLAN:  In order of problems listed above:  History of atypical chest pain, coronary CTA 02/13/2020 calcium score 0 normal coronary arteries, mild aortic atherosclerosis. No further chest pain-was M-S. Very active, can f/u with [place] prn  Hyperlipidemia on a statin-LDL 103 but HDL 81. Continue current dose Crestor.   Known aortic atherosclerosis on statin  Shared Decision Making/Informed Consent        Medication Adjustments/Labs and Tests Ordered: Current medicines are reviewed at length with the patient today.  Concerns regarding medicines are outlined above.  Medication  changes, Labs and Tests ordered today are listed in the Patient Instructions below. Patient Instructions  Medication Instructions:  Your physician recommends that you continue on your current medications as directed. Please refer to the Current Medication list given to you today.  *If you need a refill on your cardiac medications before your next appointment, please call your pharmacy*   Lab Work: None ordered  If you have labs (blood work) drawn today and your tests are completely normal, you will receive your results only by: [place] (if you have MyChart) OR A paper copy in the mail If you have any lab test that is abnormal or we need to change your treatment, we will call you to review the results.   Testing/Procedures: None ordered   Follow-Up: At [facility] you and your health needs are our priority.  As part of our continuing mission to provide you with exceptional heart care, we have created designated Provider Care Teams.  These Care Teams include your primary Cardiologist (physician) and Advanced Practice Providers (APPs -  Physician Assistants and Nurse Practitioners) who all work together to provide you with the care you need, when you need it.  We recommend signing up for the patient portal called "MyChart".  Sign up information is provided on this After Visit Summary.  MyChart is used to connect with patients for Virtual Visits (Telemedicine).  Patients are able to view lab/test results, encounter notes, upcoming appointments, etc.  Non-urgent messages can be sent to your provider as well.   To learn more about what you can do with MyChart, go to [email or web address].    Your next appointment:   As Needed  The format for your next appointment:   In Person  Provider:   You may see Candee Furbish, MD or one  of the following Advanced Practice Providers on your designated Care Team:   Cecilie Kicks, NP    Other Instructions    Signed, Ermalinda Barrios,  PA-C  06/16/2021 8:34 AM    [facility] [address], [place], [place]  [address] Phone: [phone]; Fax: [phone]

## 2021-06-16 ENCOUNTER — Encounter: Payer: Self-pay | Admitting: Physician Assistant

## 2021-06-16 ENCOUNTER — Ambulatory Visit: Payer: Federal, State, Local not specified - PPO | Admitting: Physician Assistant

## 2021-06-16 ENCOUNTER — Other Ambulatory Visit: Payer: Self-pay

## 2021-06-16 VITALS — BP 120/62 | HR 60 | Ht 64.0 in | Wt 171.0 lb

## 2021-06-16 DIAGNOSIS — R0789 Other chest pain: Secondary | ICD-10-CM | POA: Diagnosis not present

## 2021-06-16 DIAGNOSIS — E785 Hyperlipidemia, unspecified: Secondary | ICD-10-CM

## 2021-06-16 DIAGNOSIS — I700 Atherosclerosis of aorta: Secondary | ICD-10-CM | POA: Diagnosis not present

## 2021-06-16 NOTE — Patient Instructions (Addendum)
Medication Instructions:  Your physician recommends that you continue on your current medications as directed. Please refer to the Current Medication list given to you today.  *If you need a refill on your cardiac medications before your next appointment, please call your pharmacy*   Lab Work: None ordered  If you have labs (blood work) drawn today and your tests are completely normal, you will receive your results only by: [place] (if you have MyChart) OR A paper copy in the mail If you have any lab test that is abnormal or we need to change your treatment, we will call you to review the results.   Testing/Procedures: None ordered   Follow-Up: At [facility], you and your health needs are our priority.  As part of our continuing mission to provide you with exceptional heart care, we have created designated Provider Care Teams.  These Care Teams include your primary Cardiologist (physician) and Advanced Practice Providers (APPs -  Physician Assistants and Nurse Practitioners) who all work together to provide you with the care you need, when you need it.  We recommend signing up for the patient portal called "MyChart".  Sign up information is provided on this After Visit Summary.  MyChart is used to connect with patients for Virtual Visits (Telemedicine).  Patients are able to view lab/test results, encounter notes, upcoming appointments, etc.  Non-urgent messages can be sent to your provider as well.   To learn more about what you can do with MyChart, go to [email or web address].    Your next appointment:   As Needed  The format for your next appointment:   In Person  Provider:   You may see Candee Furbish, MD or one of the following Advanced Practice Providers on your designated Care Team:   Cecilie Kicks, NP    Other Instructions

## 2021-06-29 LAB — HM DEXA SCAN

## 2021-07-01 ENCOUNTER — Encounter: Payer: Self-pay | Admitting: Internal Medicine

## 2021-07-01 NOTE — Progress Notes (Signed)
Outside notes received. Information abstracted. Notes sent to scan.  

## 2021-07-15 NOTE — Progress Notes (Signed)
Subjective:    Patient ID: Lori [name], female    DOB: 1951/04/26, 70 y.o.   MRN: [id number]  This visit occurred during the SARS-CoV-2 public health emergency.  Safety protocols were in place, including screening questions prior to the visit, additional usage of staff PPE, and extensive cleaning of exam room while observing appropriate contact time as indicated for disinfecting solutions.    HPI The patient is here for an acute visit.  Snake or spider bite on right leg -  she felt a bite when she was doing yard work, 5 days ago.  She did not see what bit her.  She later noticed two bite marks on her right lateral lower leg.  The surrounding area was swollen and sore.  It remains sore for several days.  All of her symptoms are better now.  She denies any numbness or tingling.  Besides the leg soreness she had no other symptoms.  Later that day she did see a black snake near the lawn more where she was bit and assumes that may have been what bit her  She recently saw cardiology.  Because her coronary calcium artery score was 0 and she is not having any concerning symptoms she does not need to continue to follow with them on a regular basis.  I will prescribe her cholesterol medication.  She is exercising regularly without any concerning symptoms.   Medications and allergies reviewed with patient and updated if appropriate.  Patient Active Problem List   Diagnosis Date Noted   Nonvenomous snake bite 07/16/2021   Candidiasis of breast 04/28/2021   Acute medial meniscus tear, left, initial encounter 07/30/2020   Acute tear lateral meniscus, left, initial encounter 07/30/2020   Closed subchondral insufficiency fracture of condyle of left femur ([place]) 07/30/2020   Baker's cyst of knee, left 06/04/2020   Atherosclerosis of right iliac artery 05/21/2020   Arthritis of right knee 07/24/2019   Right shoulder pain 07/03/2019   Stress at home 11/14/2018   Hyperglycemia 05/08/2018   Vertigo  04/17/2018   Pyelonephritis 05/02/2017   Hyperlipidemia 04/27/2017   Personal history of colonic polyps 06/06/2014   Family history of colon cancer 06/06/2014   Osteopenia 11/04/2013    Current Outpatient Medications on File Prior to Visit  Medication Sig Dispense Refill   Ascorbic Acid (VITAMIN C) 500 MG CHEW Chew 1 tablet by mouth daily.     b complex vitamins capsule Take 1 capsule by mouth daily.     Calcium Carbonate-Vitamin D 600-400 MG-UNIT tablet Take 1 tablet by mouth daily.     Cholecalciferol (VITAMIN D3) 50 MCG (2000 UT) TABS Take 2,000 Units by mouth daily.      COD LIVER OIL PO Take 1 capsule by mouth daily.      conjugated estrogens (PREMARIN) vaginal cream Place 1 Applicatorful vaginally as needed.     Ginkgo Biloba 60 MG CAPS Take 60 mg by mouth daily.      Misc Natural Products (TART CHERRY ADVANCED) CAPS Take 1 capsule by mouth daily.      Multiple Vitamins-Minerals (ONE-A-DAY WOMENS 50 PLUS PO) Take 1 tablet by mouth.      Psyllium (METAMUCIL FIBER PO) Take 8 capsules by mouth. Patient takes up 8 capsules daily     pyridOXINE (VITAMIN B-6) 100 MG tablet Take 100 mg by mouth daily.     Rhubarb (ESTROVEN COMPLETE PO) Take 1 tablet by mouth daily.      rosuvastatin (CRESTOR) 20 MG  tablet Take 1 tablet (20 mg total) by mouth daily. 90 tablet 3   Turmeric (QC TUMERIC COMPLEX PO) Take 1 tablet by mouth daily.      vitamin B-12 (CYANOCOBALAMIN) 500 MCG tablet Take 500 mcg by mouth daily.     Vitamin E 180 MG CAPS Take 180 mg by mouth daily.      No current facility-administered medications on file prior to visit.    Past Medical History:  Diagnosis Date   Acute pain of left shoulder 07/03/2019   Arthritis of right knee 07/24/2019   Atypical chest pain 11/14/2018   Chronic pain of right knee 05/22/2019   CTS (carpal tunnel syndrome)    chronic   Depression 01/16/2013   Family history of colon cancer 06/06/2014   Headache 06/28/2019   History of colon polyps     Hyperglycemia 05/08/2018   Hyperlipidemia 04/27/2017   Left arm numbness 11/21/2017   Left wrist pain 07/03/2019   Osteopenia    Personal history of colonic polyps 06/06/2014   PONV (postoperative nausea and vomiting)    Pyelonephritis 05/02/2017   Stress at home 11/14/2018   stress w/dtr who has ovarian cancer 1/20   Tubular adenoma    UTI (urinary tract infection)    Vertigo 04/17/2018    Past Surgical History:  Procedure Laterality Date   ABDOMINAL HYSTERECTOMY  1994   fibroids   APPENDECTOMY     BUNIONECTOMY Right    BUNIONECTOMY Left 2018   CHOLECYSTECTOMY     COLONOSCOPY  12/09/2008   Dr.Rourk- normal rectum, polyp at the hepatic flexure bx= tubular adenoma. the remainder of the colonic mucosa and terminal ileum mucosa appeared normal.   COLONOSCOPY  2006   Dr.Sam [place]- colonic adenoma   COLONOSCOPY N/A 06/25/2014   Procedure: COLONOSCOPY;  Surgeon: Daneil Dolin, MD;  Location[facility];  Service: Endoscopy;  Laterality: N/A;  36 DR REQUEST TIME   HEMORRHOID SURGERY     KNEE ARTHROSCOPY Right    torn meniscus   ORIF TOE FRACTURE Right    rotator Cuff surgery     TUBAL LIGATION      Social History   Socioeconomic History   Marital status: Widowed    Spouse name: Not on file   Number of children: 2   Years of education: Not on file   Highest education level: Not on file  Occupational History   Not on file  Tobacco Use   Smoking status: Former    Packs/day: 1.00    Years: 10.00    Pack years: 10.00    Types: Cigarettes    Start date: 04/13/1971    Quit date: 11/01/1993    Years since quitting: 27.7   Smokeless tobacco: Never  Vaping Use   Vaping Use: Never used  Substance and Sexual Activity   Alcohol use: Yes    Alcohol/week: 0.0 standard drinks    Comment: wine   Drug use: No   Sexual activity: Yes    Birth control/protection: Surgical  Other Topics Concern   Not on file  Social History Narrative   Exercising regularly - senior exercises, walking    Has 2 children.   Social Determinants of Health   Financial Resource Strain: Not on file  Food Insecurity: Not on file  Transportation Needs: Not on file  Physical Activity: Not on file  Stress: Not on file  Social Connections: Not on file    Family History  Problem Relation Age of Onset  Bipolar disorder Sister    Schizophrenia Sister    Kidney disease Sister    Hypertension Sister    Bipolar disorder Sister    Kidney disease Sister    Hypertension Mother    Mental illness Mother    Dementia Mother    Pancreatic cancer Mother    Cancer Father 44       colon; bladder   Hypertension Father    Heart disease Father    Arthritis Father    Prostate cancer Father    Esophageal cancer Father    Stomach cancer Father    Colon cancer Father    Kidney disease Father    Bladder Cancer Father    Ovarian cancer Daughter    Bipolar disorder Son    Liver cancer Neg Hx     Review of Systems     Objective:   Vitals:   07/16/21 0857  BP: 132/80  Pulse: 97  Temp: 97.9 F (36.6 C)  SpO2: 99%   BP Readings from Last 3 Encounters:  07/16/21 132/80  06/16/21 120/62  05/25/21 120/74   Wt Readings from Last 3 Encounters:  07/16/21 174 lb (78.9 kg)  06/16/21 171 lb (77.6 kg)  05/25/21 170 lb (77.1 kg)   Body mass index is 29.87 kg/m.   Physical Exam Constitutional:      General: She is not in acute distress.    Appearance: Normal appearance. She is not ill-appearing.  HENT:     Head: Normocephalic and atraumatic.  Skin:    General: Skin is warm and dry.     Comments: Fading residual bite marks barely visible, no surrounding swelling, erythema.  Minimal tenderness near the bite marks  Neurological:     Mental Status: She is alert.           Assessment & Plan:    See Problem List for Assessment and Plan of chronic medical problems.

## 2021-07-16 ENCOUNTER — Ambulatory Visit: Payer: Federal, State, Local not specified - PPO | Admitting: Internal Medicine

## 2021-07-16 ENCOUNTER — Encounter: Payer: Self-pay | Admitting: Internal Medicine

## 2021-07-16 ENCOUNTER — Other Ambulatory Visit: Payer: Self-pay

## 2021-07-16 DIAGNOSIS — S81851A Open bite, right lower leg, initial encounter: Secondary | ICD-10-CM | POA: Diagnosis not present

## 2021-07-16 DIAGNOSIS — W5911XA Bitten by nonvenomous snake, initial encounter: Secondary | ICD-10-CM | POA: Diagnosis not present

## 2021-07-16 DIAGNOSIS — E782 Mixed hyperlipidemia: Secondary | ICD-10-CM

## 2021-07-16 DIAGNOSIS — I708 Atherosclerosis of other arteries: Secondary | ICD-10-CM | POA: Diagnosis not present

## 2021-07-16 MED ORDER — ROSUVASTATIN CALCIUM 20 MG PO TABS: 20.0000 mg | ORAL_TABLET | Freq: Every day | ORAL | 3 refills | Status: DC

## 2021-07-16 NOTE — Assessment & Plan Note (Signed)
Chronic Well-controlled Continue rosuvastatin 20 mg daily-I will prescribe this going forward-prescription sent to pharmacy.

## 2021-07-16 NOTE — Patient Instructions (Addendum)
   You may have had snake bite.  No treatment is needed.    Medications changes include :   none

## 2021-07-16 NOTE — Assessment & Plan Note (Signed)
Acute She does have a picture of her bite and it is consistent with a snakebite obviously this was nonvenomous given only mild local reaction and no other symptoms She later did see a black snake near the area where she was bit Symptoms improving no most completely resolved-minimal tenderness only at this time No further evaluation or treatment

## 2021-07-16 NOTE — Addendum Note (Signed)
Addended by: Binnie Rail on: 07/16/2021 10:16 AM   Modules accepted: Orders

## 2021-08-02 ENCOUNTER — Ambulatory Visit: Payer: Federal, State, Local not specified - PPO | Admitting: Family Medicine

## 2021-08-02 ENCOUNTER — Other Ambulatory Visit: Payer: Self-pay

## 2021-08-02 ENCOUNTER — Ambulatory Visit: Payer: Self-pay

## 2021-08-02 VITALS — BP 124/82 | HR 75 | Ht 64.0 in | Wt 175.0 lb

## 2021-08-02 DIAGNOSIS — M25562 Pain in left knee: Secondary | ICD-10-CM | POA: Diagnosis not present

## 2021-08-02 DIAGNOSIS — G8929 Other chronic pain: Secondary | ICD-10-CM

## 2021-08-02 NOTE — Patient Instructions (Addendum)
Thank you for coming in today.   You received an injection today. Seek immediate medical attention if the joint becomes red, extremely painful, or is oozing fluid.   You will hear from [facility] when we get the gel shots authorized.  Recheck as needed

## 2021-08-02 NOTE — Progress Notes (Signed)
I, Lori [name], LAT, ATC, am serving as scribe for Lori [name].  Lori [name] is a 70 y.o. female who presents to [place] at [facility] today for f/u of chronic L knee pain.  She was last seen by Lori [name] on 05/06/21 after hitting her L knee/patella on the corner of a chair and reporting mechanical symptoms and pain w/ L knee extension.  She had a L knee injection and was advised to use Voltaren gel.  Today, pt reports L knee started hurting again about 2 weeks ago. Pt c/o pain w/ knee flexion, transitioning from seated to standing, and going up the stairs. Pt has been doing a lot amount of hiking is is wondering if this contributed to the return of her L knee pain.  Diagnostic imaging: L knee XR- 05/06/21; L knee MRI- 06/02/20; L knee XR- 04/02/20  Pertinent review of systems: No fevers or chills  Relevant historical information: History of insufficiency fracture left knee   Exam:  BP 124/82   Pulse 75   Ht 5\' 4"  (1.626 m)   Wt 175 lb (79.4 kg)   SpO2 97%   BMI 30.04 kg/m  General: Well Developed, well nourished, and in no acute distress.   MSK: Left knee mild effusion normal motion with crepitation.  Stable ligamentous exam. Intact strength.    Lab and Radiology Results  Procedure: Real-time Ultrasound Guided Injection of left knee superior lateral patellar space Device: Philips Affiniti 50G Images permanently stored and available for review in PACS Verbal informed consent obtained.  Discussed risks and benefits of procedure. Warned about infection bleeding damage to structures skin hypopigmentation and fat atrophy among others. Patient expresses understanding and agreement Time-out conducted.   Noted no overlying erythema, induration, or other signs of local infection.   Skin prepped in a sterile fashion.   Local anesthesia: Topical Ethyl chloride.   With sterile technique and under real time ultrasound guidance: 40 mg of Kenalog and 2 mL of  Marcaine injected into knee joint. Fluid seen entering the joint capsule.   Completed without difficulty   Pain immediately resolved suggesting accurate placement of the medication.   Advised to call if fevers/chills, erythema, induration, drainage, or persistent bleeding.   Images permanently stored and available for review in the ultrasound unit.  Impression: Technically successful ultrasound guided injection.    EXAM: LEFT KNEE 3 VIEWS   COMPARISON:  04/02/2020   FINDINGS: Frontal, lateral, and sunrise views of the left knee are obtained. There is stable mild 3 compartmental osteoarthritis greatest in the medial and patellofemoral compartments. Since the previous exam, sclerosis has developed within the medial femoral condyle, concerning for underlying nondisplaced stress fracture. Correlation with MRI may be useful for further characterization. Small joint effusion. Soft tissues are unremarkable.   IMPRESSION: 1. Interval development of sclerosis within the medial femoral condyle, concerning for nondisplaced stress fracture. Correlation with MRI is recommended. 2. Stable mild 3 compartmental osteoarthritis. 3. Small joint effusion.   These results will be called to the ordering clinician or representative by the Radiologist Assistant, and communication documented in the PACS or [employer].     Electronically Signed   By: Lori [name] M.D.   On: 05/08/2021 18:07  EXAM: MRI OF THE LEFT KNEE WITHOUT CONTRAST   TECHNIQUE: Multiplanar, multisequence MR imaging of the knee was performed. No intravenous contrast was administered.   COMPARISON:  04/02/2020 radiographs   FINDINGS: MENISCI   Medial meniscus: Large radial tear  of the root of the posterior horn medial meniscus, image 14/9. Amorphous degenerative signal in much of the remaining posterior horn and midbody questionable extension to the superior meniscal surface on image 9/10, potentially  from additional degenerative tearing.   Lateral meniscus: Oblique grade 3 surface tear of the anterior horn with horizontal extension towards the free edge for example on images 20 through 22 of series 10. This tear likely extends into the anterior root.   LIGAMENTS   Cruciates:  Unremarkable   Collaterals: Mildly thickened proximal MCL with adjacent edema suggesting low-grade sprain   CARTILAGE   Patellofemoral: Moderate degenerative chondral thinning along the medial patellar facet. Chondral thinning centrally along the femoral trochlear groove (especially inferiorly) and along the medial femoral trochlear groove.   Medial: Moderate to prominent degenerative chondral thinning along the medial femoral condyle with moderate chondral thinning medially along the medial tibial plateau.   Lateral:  Mild marginal spurring.   Joint: Small knee effusion with associated synovitis. No current gas visible in the joint.   Popliteal Fossa: Ruptured Baker's cyst with some complexity or synovitis for example on image 16/6 and image 10/8. This could possibly reflect blood products. I favor that the plantaris is probably intact although some of the appearance resembles findings encountered in plantaris tear including fluid signal between the soleus medial head gastrocnemius.   Extensor Mechanism: Prepatellar subcutaneous edema, otherwise unremarkable.   Bones: No significant extra-articular osseous abnormalities identified.   Other: No supplemental non-categorized findings.   IMPRESSION: 1. Large radial tear of the root of the posterior horn medial meniscus. Probable adjacent degenerative tearing the posterior horn. 2. Oblique grade 3 surface tear of the anterior horn lateral meniscus with horizontal extension towards the free edge. This tear likely extends into the anterior root. 3. Mildly thickened proximal MCL with adjacent edema suggesting low-grade sprain. 4. Moderate to  prominent degenerative chondral thinning in the medial compartment and medially in the patellofemoral joint. 5. Small knee effusion with associated synovitis. 6. Ruptured Baker's cyst with some complexity or synovitis. Suspect that the fluid signal between the lateral head gastrocnemius and soleus is probably from ruptured Baker's cyst rather than a plantaris tear.     Electronically Signed   By: [name]   On: 06/02/2020 10:32 I, Lynne [name], personally (independently) visualized and performed the interpretation of the images attached in this note.    Assessment and Plan: 70 y.o. female with left knee pain thought to be due to DJD.  Plan for repeat steroid injection.  We will work on authorization now for hyaluronic acid as well so that we can do that in the future if needed.  Patient is doing pretty well overall with this repeat steroid injection plan.  Continue activity including hiking.  Of note sclerosis seen on x-ray from July thought to be from old insufficiency fracture seen on MRI August of last year and not new issue.   PDMP not reviewed this encounter. Orders Placed This Encounter  Procedures   [place] LIMITED JOINT SPACE STRUCTURES LOW LEFT(NO LINKED CHARGES)    Standing Status:   Future    Number of Occurrences:   1    Standing Expiration Date:   01/31/2022    Order Specific Question:   Reason for Exam (SYMPTOM  OR DIAGNOSIS REQUIRED)    Answer:   left knee pain    Order Specific Question:   Preferred imaging location?    Answer:   [place]  No orders of the defined types were placed in this encounter.    Discussed warning signs or symptoms. Please see discharge instructions. Patient expresses understanding.   The above documentation has been reviewed and is accurate and complete Lynne [name], M.D.

## 2021-08-04 ENCOUNTER — Telehealth: Payer: Self-pay

## 2021-08-04 NOTE — Telephone Encounter (Signed)
Informed pt her insurance has approved Orthovisc/Monovisc and whenever her knee started hurting again, she can call our office to schedule a visit to start the gel series. Pt verbalized understanding.

## 2021-08-12 ENCOUNTER — Encounter: Payer: Self-pay | Admitting: Internal Medicine

## 2021-08-12 NOTE — Progress Notes (Signed)
Outside notes received. Information abstracted. Notes sent to scan.  

## 2021-09-01 ENCOUNTER — Encounter: Payer: Self-pay | Admitting: Internal Medicine

## 2021-09-01 ENCOUNTER — Other Ambulatory Visit: Payer: Self-pay

## 2021-09-01 ENCOUNTER — Ambulatory Visit: Payer: Federal, State, Local not specified - PPO | Admitting: Internal Medicine

## 2021-09-01 VITALS — BP 130/80 | HR 93 | Temp 98.8°F | Resp 18 | Ht 64.0 in | Wt 169.9 lb

## 2021-09-01 DIAGNOSIS — J029 Acute pharyngitis, unspecified: Secondary | ICD-10-CM | POA: Insufficient documentation

## 2021-09-01 LAB — POCT INFLUENZA A/B
Influenza A, POC: NEGATIVE
Influenza B, POC: NEGATIVE

## 2021-09-01 LAB — POC COVID19 BINAXNOW: SARS Coronavirus 2 Ag: NEGATIVE

## 2021-09-01 MED ORDER — PROMETHAZINE-DM 6.25-15 MG/5ML PO SYRP: 5.0000 mL | ORAL_SOLUTION | Freq: Two times a day (BID) | ORAL | 0 refills | Status: DC | PRN

## 2021-09-01 NOTE — Progress Notes (Signed)
   Subjective:   Patient ID: Lori [name], female    DOB: [birth year], 70 y.o.   MRN: [id number]  Sore Throat  Associated symptoms include coughing and headaches. Pertinent negatives include no abdominal pain, diarrhea, shortness of breath or vomiting.  The patient is a 70 YO female coming in for sore throat 2 days. Has not taken covid-19 test. No flu shot this year. Prior covid-19 vaccine and new booster.   Review of Systems  Constitutional: Negative.   HENT:  Positive for postnasal drip, rhinorrhea and sore throat.   Eyes: Negative.   Respiratory:  Positive for cough. Negative for chest tightness and shortness of breath.   Cardiovascular:  Negative for chest pain, palpitations and leg swelling.  Gastrointestinal:  Negative for abdominal distention, abdominal pain, constipation, diarrhea, nausea and vomiting.  Musculoskeletal: Negative.   Skin: Negative.   Neurological:  Positive for headaches.  Psychiatric/Behavioral: Negative.     Objective:  Physical Exam Constitutional:      Appearance: She is well-developed.  HENT:     Head: Normocephalic and atraumatic.     Nose: Congestion present.     Mouth/Throat:     Pharynx: Posterior oropharyngeal erythema present.  Cardiovascular:     Rate and Rhythm: Normal rate and regular rhythm.  Pulmonary:     Effort: Pulmonary effort is normal. No respiratory distress.     Breath sounds: Normal breath sounds. No wheezing or rales.  Abdominal:     General: Bowel sounds are normal. There is no distension.     Palpations: Abdomen is soft.     Tenderness: There is no abdominal tenderness. There is no rebound.  Musculoskeletal:     Cervical back: Normal range of motion.  Skin:    General: Skin is warm and dry.  Neurological:     Mental Status: She is alert and oriented to person, place, and time.     Coordination: Coordination normal.    Vitals:   09/01/21 0917  BP: 130/80  Pulse: 93  Resp: 18  Temp: 98.8 F (37.1 C)  TempSrc: Oral   SpO2: 98%  Weight: 169 lb 14.4 oz (77.1 kg)  Height: 5\' 4"  (1.626 m)    This visit occurred during the SARS-CoV-2 public health emergency.  Safety protocols were in place, including screening questions prior to the visit, additional usage of staff PPE, and extensive cleaning of exam room while observing appropriate contact time as indicated for disinfecting solutions.   Assessment & Plan:

## 2021-09-01 NOTE — Patient Instructions (Signed)
We have sent in promethazine/dm cough syrup which you can use for cough.  The covid-19 and flu testing are negative so this is likely allergies.   Take zyrtec over the counter and it is okay to take tylenol or chloroseptic throat spray for pain.

## 2021-09-01 NOTE — Assessment & Plan Note (Signed)
POC flu and covid-19 testing done in office which were negative. Likely caused by raking leaves day prior to symptom onset. Advised to take zyrtec for allergy drainage and use tylenol or chloroseptic for sore throat. Rx promethazine/dm cough medicine for cough.

## 2021-09-09 NOTE — Progress Notes (Signed)
Subjective:    Patient ID: Lori [name], female    DOB: 1950-10-28, 70 y.o.   MRN: [id number]  This visit occurred during the SARS-CoV-2 public health emergency.  Safety protocols were in place, including screening questions prior to the visit, additional usage of staff PPE, and extensive cleaning of exam room while observing appropriate contact time as indicated for disinfecting solutions.     HPI The patient is here for follow up from her last visit.   He saw Dr Sharlet Salina 11/9 for sore throat.  She also had cough and HA's.  Symptoms started 2 days prior.  Flu and covid were neg.  Symptoms thought to be from raking leaves the day prior.  She was advised to take zyrtec and prescription cough syrup.   She has been in bed most of the time since she was here last.  She feels horrible.  Her sore throat is better.  The coughing is not a problem.  She has frontal head pain/sinus pain and nasal congestion that feels like something is stuck in there.  She has discolored mucus - she did bring pictures.  At times there is tinges of blood in the mucus and its either very dark yellow or slightly brownish in color.  Mucus is thick.  She states decreased appetite, mild shortness of breath.  She has not had any fevers.    Medications and allergies reviewed with patient and updated if appropriate.  Patient Active Problem List   Diagnosis Date Noted   Sore throat 09/01/2021   Sore throat 09/01/2021   Nonvenomous snake bite 07/16/2021   Candidiasis of breast 04/28/2021   Acute medial meniscus tear, left, initial encounter 07/30/2020   Acute tear lateral meniscus, left, initial encounter 07/30/2020   Closed subchondral insufficiency fracture of condyle of left femur ([place]) 07/30/2020   Baker's cyst of knee, left 06/04/2020   Atherosclerosis of right iliac artery 05/21/2020   Arthritis of right knee 07/24/2019   Right shoulder pain 07/03/2019   Stress at home 11/14/2018   Hyperglycemia 05/08/2018    Vertigo 04/17/2018   Pyelonephritis 05/02/2017   Hyperlipidemia 04/27/2017   Personal history of colonic polyps 06/06/2014   Family history of colon cancer 06/06/2014   Osteopenia 11/04/2013    Current Outpatient Medications on File Prior to Visit  Medication Sig Dispense Refill   Ascorbic Acid (VITAMIN C) 500 MG CHEW Chew 1 tablet by mouth daily.     b complex vitamins capsule Take 1 capsule by mouth daily.     Calcium Carbonate-Vitamin D 600-400 MG-UNIT tablet Take 1 tablet by mouth daily.     Cholecalciferol (VITAMIN D3) 50 MCG (2000 UT) TABS Take 2,000 Units by mouth daily.      COD LIVER OIL PO Take 1 capsule by mouth daily.      conjugated estrogens (PREMARIN) vaginal cream Place 1 Applicatorful vaginally as needed.     Ginkgo Biloba 60 MG CAPS Take 60 mg by mouth daily.      Misc Natural Products (TART CHERRY ADVANCED) CAPS Take 1 capsule by mouth daily.      Multiple Vitamins-Minerals (ONE-A-DAY WOMENS 50 PLUS PO) Take 1 tablet by mouth.      promethazine-dextromethorphan (PROMETHAZINE-DM) 6.25-15 MG/5ML syrup Take 5 mLs by mouth 2 (two) times daily as needed for cough. 118 mL 0   Psyllium (METAMUCIL FIBER PO) Take 8 capsules by mouth. Patient takes up 8 capsules daily     pyridOXINE (VITAMIN B-6) 100  MG tablet Take 100 mg by mouth daily.     Rhubarb (ESTROVEN COMPLETE PO) Take 1 tablet by mouth daily.      rosuvastatin (CRESTOR) 20 MG tablet Take 1 tablet (20 mg total) by mouth daily. 90 tablet 3   Turmeric (QC TUMERIC COMPLEX PO) Take 1 tablet by mouth daily.      vitamin B-12 (CYANOCOBALAMIN) 500 MCG tablet Take 500 mcg by mouth daily.     Vitamin E 180 MG CAPS Take 180 mg by mouth daily.      No current facility-administered medications on file prior to visit.    Past Medical History:  Diagnosis Date   Acute pain of left shoulder 07/03/2019   Arthritis of right knee 07/24/2019   Atypical chest pain 11/14/2018   Chronic pain of right knee 05/22/2019   CTS (carpal tunnel  syndrome)    chronic   Depression 01/16/2013   Family history of colon cancer 06/06/2014   Headache 06/28/2019   History of colon polyps    Hyperglycemia 05/08/2018   Hyperlipidemia 04/27/2017   Left arm numbness 11/21/2017   Left wrist pain 07/03/2019   Osteopenia    Personal history of colonic polyps 06/06/2014   PONV (postoperative nausea and vomiting)    Pyelonephritis 05/02/2017   Stress at home 11/14/2018   stress w/dtr who has ovarian cancer 1/20   Tubular adenoma    UTI (urinary tract infection)    Vertigo 04/17/2018    Past Surgical History:  Procedure Laterality Date   ABDOMINAL HYSTERECTOMY  1994   fibroids   APPENDECTOMY     BUNIONECTOMY Right    BUNIONECTOMY Left 2018   CHOLECYSTECTOMY     COLONOSCOPY  12/09/2008   Dr.Rourk- normal rectum, polyp at the hepatic flexure bx= tubular adenoma. the remainder of the colonic mucosa and terminal ileum mucosa appeared normal.   COLONOSCOPY  2006   Dr.Sam Gwynn- colonic adenoma   COLONOSCOPY N/A 06/25/2014   Procedure: COLONOSCOPY;  Surgeon: Daneil Dolin, MD;  Location: [facility];  Service: Endoscopy;  Laterality: N/A;  16 DR REQUEST TIME   HEMORRHOID SURGERY     KNEE ARTHROSCOPY Right    torn meniscus   ORIF TOE FRACTURE Right    rotator Cuff surgery     TUBAL LIGATION      Social History   Socioeconomic History   Marital status: Widowed    Spouse name: Not on file   Number of children: 2   Years of education: Not on file   Highest education level: Not on file  Occupational History   Not on file  Tobacco Use   Smoking status: Former    Packs/day: 1.00    Years: 10.00    Pack years: 10.00    Types: Cigarettes    Start date: 04/13/1971    Quit date: 11/01/1993    Years since quitting: 27.8   Smokeless tobacco: Never  Vaping Use   Vaping Use: Never used  Substance and Sexual Activity   Alcohol use: Yes    Alcohol/week: 0.0 standard drinks    Comment: wine   Drug use: No   Sexual activity: Yes    Birth  control/protection: Surgical  Other Topics Concern   Not on file  Social History Narrative   Exercising regularly - senior exercises, walking   Has 2 children.   Social Determinants of Health   Financial Resource Strain: Not on file  Food Insecurity: Not on file  Transportation Needs: Not  on file  Physical Activity: Not on file  Stress: Not on file  Social Connections: Not on file    Family History  Problem Relation Age of Onset   Bipolar disorder Sister    Schizophrenia Sister    Kidney disease Sister    Hypertension Sister    Bipolar disorder Sister    Kidney disease Sister    Hypertension Mother    Mental illness Mother    Dementia Mother    Pancreatic cancer Mother    Cancer Father 75       colon; bladder   Hypertension Father    Heart disease Father    Arthritis Father    Prostate cancer Father    Esophageal cancer Father    Stomach cancer Father    Colon cancer Father    Kidney disease Father    Bladder Cancer Father    Ovarian cancer Daughter    Bipolar disorder Son    Liver cancer Neg Hx     Review of Systems  Constitutional:  Positive for appetite change. Negative for fever.  HENT:  Positive for congestion and sinus pain. Negative for ear pain and sore throat.   Respiratory:  Positive for cough (mild) and shortness of breath (mild with stairs). Negative for wheezing.   Gastrointestinal:  Negative for abdominal pain, diarrhea and nausea.  Musculoskeletal:  Negative for myalgias.  Neurological:  Positive for headaches. Negative for dizziness and light-headedness.      Objective:   Vitals:   09/10/21 1040  BP: 140/82  Pulse: 98  Temp: 98.6 F (37 C)  SpO2: 99%   BP Readings from Last 3 Encounters:  09/10/21 140/82  09/01/21 130/80  08/02/21 124/82   Wt Readings from Last 3 Encounters:  09/10/21 174 lb (78.9 kg)  09/01/21 169 lb 14.4 oz (77.1 kg)  08/02/21 175 lb (79.4 kg)   Body mass index is 29.87 kg/m.   Physical Exam    GENERAL  APPEARANCE: Appears stated age, well appearing, NAD EYES: conjunctiva clear, no icterus HENT: bilateral tympanic membranes and ear canals normal, oropharynx with no erythema or exudates, trachea midline, no cervical or supraclavicular lymphadenopathy LUNGS: Unlabored breathing, good air entry bilaterally, clear to auscultation without wheeze or crackles CARDIOVASCULAR: Normal S1,S2 , no edema SKIN: Warm, dry      Assessment & Plan:    See Problem List for Assessment and Plan of chronic medical problems.

## 2021-09-10 ENCOUNTER — Encounter: Payer: Self-pay | Admitting: Internal Medicine

## 2021-09-10 ENCOUNTER — Other Ambulatory Visit: Payer: Self-pay

## 2021-09-10 ENCOUNTER — Ambulatory Visit: Payer: Federal, State, Local not specified - PPO | Admitting: Internal Medicine

## 2021-09-10 VITALS — BP 140/82 | HR 98 | Temp 98.6°F | Ht 64.0 in | Wt 174.0 lb

## 2021-09-10 DIAGNOSIS — J0120 Acute ethmoidal sinusitis, unspecified: Secondary | ICD-10-CM | POA: Insufficient documentation

## 2021-09-10 MED ORDER — DOXYCYCLINE HYCLATE 100 MG PO TABS: 100.0000 mg | ORAL_TABLET | Freq: Two times a day (BID) | ORAL | 0 refills | Status: DC

## 2021-09-10 NOTE — Assessment & Plan Note (Signed)
Acute Likely bacterial  Start doxycycline 100 mg BID x 10 day otc cold medications-consider using saline nasal spray and taking Mucinex Rest, fluid Call if no improvement

## 2021-09-10 NOTE — Patient Instructions (Addendum)
    You have a sinus infection.    Medications changes include :   doxycycline for your sinus infection   You can also try using saline nasal spray and taking mucinex to thin out your mucus.   Your prescription(s) have been submitted to your pharmacy. Please take as directed and contact our office if you believe you are having problem(s) with the medication(s).

## 2021-09-13 ENCOUNTER — Telehealth: Payer: Self-pay | Admitting: Internal Medicine

## 2021-09-13 MED ORDER — AZITHROMYCIN 250 MG PO TABS: ORAL_TABLET | ORAL | 0 refills | Status: DC

## 2021-09-13 NOTE — Telephone Encounter (Signed)
Prescription sent

## 2021-09-13 NOTE — Telephone Encounter (Signed)
I agree with stopping the doxycycline.  The saline nasal spray she can try - it is just salt water.     I can prescribe a different antibiotic if she wants. - we can try a zpak which is usually well tolerated

## 2021-09-13 NOTE — Telephone Encounter (Signed)
Pt. Stopped by office. I informed pt. Of message from MD. Pt. States she would like the Zpack sent over, and verbalized understanding of other note. Also gave advised information via writing.    [id number] - [address], [address] [place] Phone:  [phone]  Fax:  [phone]

## 2021-09-13 NOTE — Telephone Encounter (Signed)
Patient is complaining of severe headache pain around the base of head to the side and nausea. Stated it started Friday after taking the antibiotic. She stopped taking the antibiotic and mucinex yesterday; has not tried the saline nasal spray because she did not feel comfortable taking all of it together. Requesting advise on next steps.   Please advise.

## 2021-09-23 ENCOUNTER — Ambulatory Visit: Payer: Federal, State, Local not specified - PPO | Admitting: Family Medicine

## 2021-09-23 ENCOUNTER — Ambulatory Visit: Payer: Self-pay

## 2021-09-23 ENCOUNTER — Other Ambulatory Visit: Payer: Self-pay

## 2021-09-23 VITALS — BP 130/82 | HR 99 | Ht 64.0 in | Wt 174.4 lb

## 2021-09-23 DIAGNOSIS — M7062 Trochanteric bursitis, left hip: Secondary | ICD-10-CM

## 2021-09-23 DIAGNOSIS — M7989 Other specified soft tissue disorders: Secondary | ICD-10-CM

## 2021-09-23 DIAGNOSIS — M25562 Pain in left knee: Secondary | ICD-10-CM | POA: Diagnosis not present

## 2021-09-23 DIAGNOSIS — G8929 Other chronic pain: Secondary | ICD-10-CM

## 2021-09-23 DIAGNOSIS — S83232D Complex tear of medial meniscus, current injury, left knee, subsequent encounter: Secondary | ICD-10-CM | POA: Diagnosis not present

## 2021-09-23 DIAGNOSIS — M1732 Unilateral post-traumatic osteoarthritis, left knee: Secondary | ICD-10-CM

## 2021-09-23 NOTE — Progress Notes (Signed)
I, [name], LAT, ATC, am serving as scribe for [name].  Lori [name] is a 70 y.o. female who presents to [place] at [facility] today for f/u of L knee pain and new L lower leg " knot."  She was last seen by [name] on 08/02/21 for f/u of chronic L knee pain and had a L knee steroid injection.   Orthovisc and Monovisc have been approved for her L knee.  Today, pt reports L knee is very swollen and continuing to swell. Pt is now experiencing pain in the lateral aspect of her L lower leg, lateral aspect of the L hip and L thigh. No back pain noted.  She notes some leg swelling left-sided lower leg.  Diagnostic testing: L knee XR- 05/06/21  Pertinent review of systems: No fevers or chills  Relevant historical information: No history of DVT. History of subchondral insufficiency fracture left femur healed on repeat imaging  Exam:  BP 130/82   Pulse 99   Ht 5\' 4"  (1.626 m)   Wt 174 lb 6.4 oz (79.1 kg)   SpO2 (!) 78%   BMI 29.94 kg/m  General: Well Developed, well nourished, and in no acute distress.   MSK: Left hip normal-appearing Normal motion. Tender palpation greater trochanter. Hip abduction strength diminished 4/5. Left knee mild effusion. Normal knee motion with crepitation. Stable ligamentous exam. Intact strength pain with resisted knee extension. Left lower leg slightly enlarged.  Palpable tender cord anterior lower leg consistent in appearance with superficial venous thrombophlebitis mildly. Negative calf squeeze test.  Negative Hoffman's test.    Lab and Radiology Results   EXAM: LEFT KNEE 3 VIEWS   COMPARISON:  04/02/2020   FINDINGS: Frontal, lateral, and sunrise views of the left knee are obtained. There is stable mild 3 compartmental osteoarthritis greatest in the medial and patellofemoral compartments. Since the previous exam, sclerosis has developed within the medial femoral condyle, concerning for underlying  nondisplaced stress fracture. Correlation with MRI may be useful for further characterization. Small joint effusion. Soft tissues are unremarkable.   IMPRESSION: 1. Interval development of sclerosis within the medial femoral condyle, concerning for nondisplaced stress fracture. Correlation with MRI is recommended. 2. Stable mild 3 compartmental osteoarthritis. 3. Small joint effusion.   These results will be called to the ordering clinician or representative by the Radiologist Assistant, and communication documented in the PACS or [employer].     Electronically Signed   By: [name]   On: 05/08/2021 18:07 I, [name], personally (independently) visualized and performed the interpretation of the images attached in this note.      Assessment and Plan: 70 y.o. female with left lateral hip pain thought to be due to trochanteric bursitis.  Plan to treat with physical therapy referral.  Reassess in 1 month.  Left knee pain with effusion thought to be due to exacerbation of underlying DJD. Patient has already had trial of steroid injection with mild benefit.  Next obvious step here would be hyaluronic acid injection.  This is already approved. Renise would like to try other options before proceeding with more injections.  Plan for PT and recheck in 1 month.  Left lower leg swelling.  Doubtful for DVT however patient does have evidence of superficial venous thrombophlebitis today.  Plan for venous Doppler ultrasound to assess for DVT and reassess in 1 month.   PDMP not reviewed this encounter. Orders Placed This Encounter  Procedures   Ambulatory  referral to Physical Therapy    Referral Priority:   Routine    Referral Type:   Physical Medicine    Referral Reason:   Specialty Services Required    Requested Specialty:   Physical Therapy    Number of Visits Requested:   1   No orders of the defined types were placed in this encounter.    Discussed warning signs  or symptoms. Please see discharge instructions. Patient expresses understanding.   The above documentation has been reviewed and is accurate and complete [name], M.D.

## 2021-09-23 NOTE — Patient Instructions (Addendum)
Thank you for coming in today.   I've referred you to Physical Therapy.  Let us know if you don't hear from them in one week.   You should hear soon about the DVT ultrasound.   Recheck in 1 month.

## 2021-09-29 ENCOUNTER — Other Ambulatory Visit: Payer: Self-pay

## 2021-09-29 ENCOUNTER — Ambulatory Visit (HOSPITAL_COMMUNITY)
Admission: RE | Admit: 2021-09-29 | Discharge: 2021-09-29 | Disposition: A | Discharge status: Home / Self Care | Payer: Federal, State, Local not specified - PPO | Source: Ambulatory Visit | Attending: Cardiovascular Disease | Admitting: Cardiovascular Disease

## 2021-09-29 DIAGNOSIS — M7989 Other specified soft tissue disorders: Secondary | ICD-10-CM

## 2021-09-30 NOTE — Progress Notes (Signed)
DVT ultrasound shows no DVT.

## 2021-10-01 ENCOUNTER — Encounter: Payer: Self-pay | Admitting: Rehabilitative and Restorative Service Providers"

## 2021-10-01 ENCOUNTER — Ambulatory Visit: Payer: Federal, State, Local not specified - PPO | Admitting: Rehabilitative and Restorative Service Providers"

## 2021-10-01 ENCOUNTER — Other Ambulatory Visit: Payer: Self-pay

## 2021-10-01 DIAGNOSIS — M6281 Muscle weakness (generalized): Secondary | ICD-10-CM | POA: Diagnosis not present

## 2021-10-01 DIAGNOSIS — G8929 Other chronic pain: Secondary | ICD-10-CM

## 2021-10-01 DIAGNOSIS — R600 Localized edema: Secondary | ICD-10-CM

## 2021-10-01 DIAGNOSIS — R262 Difficulty in walking, not elsewhere classified: Secondary | ICD-10-CM | POA: Diagnosis not present

## 2021-10-01 DIAGNOSIS — M25662 Stiffness of left knee, not elsewhere classified: Secondary | ICD-10-CM

## 2021-10-01 DIAGNOSIS — M25652 Stiffness of left hip, not elsewhere classified: Secondary | ICD-10-CM

## 2021-10-01 DIAGNOSIS — M25552 Pain in left hip: Secondary | ICD-10-CM

## 2021-10-01 DIAGNOSIS — M25562 Pain in left knee: Secondary | ICD-10-CM

## 2021-10-01 NOTE — Patient Instructions (Signed)
Access Code: [email or web address] URL: [email or web address] Date: 10/01/2021 Prepared by: Vista Mink  Exercises Supine Quadricep Sets - 3-5 x daily - 7 x weekly - 2-3 sets - 10 reps - 5 second hold Supine Figure 4 Piriformis Stretch - 2-3 x daily - 7 x weekly - 1 sets - 5 reps - 20 seconds hold Standing Lumbar Extension at [place] 5 x daily - 7 x weekly - 1 sets - 5 reps - 3 seconds hold

## 2021-10-01 NOTE — Therapy (Signed)
[place], [place], [address] Phone: [phone]   Fax:  [phone]  Physical Therapy Evaluation  Patient Details  Name: Lori [name] MRN: [id number] Date of Birth: 1951/02/24 Referring Provider (PT): Gregor Hams MD   Encounter Date: 10/01/2021   PT End of Session - 10/01/21 1738     Visit Number 1    Date for PT Re-Evaluation 11/26/21    Progress Note Due on Visit 12    PT Start Time 1101    PT Stop Time 1145    PT Time Calculation (min) 44 min    Activity Tolerance Patient tolerated treatment well    Behavior During Therapy [facility] for tasks assessed/performed             Past Medical History:  Diagnosis Date   Acute pain of left shoulder 07/03/2019   Arthritis of right knee 07/24/2019   Atypical chest pain 11/14/2018   Chronic pain of right knee 05/22/2019   CTS (carpal tunnel syndrome)    chronic   Depression 01/16/2013   Family history of colon cancer 06/06/2014   Headache 06/28/2019   History of colon polyps    Hyperglycemia 05/08/2018   Hyperlipidemia 04/27/2017   Left arm numbness 11/21/2017   Left wrist pain 07/03/2019   Osteopenia    Personal history of colonic polyps 06/06/2014   PONV (postoperative nausea and vomiting)    Pyelonephritis 05/02/2017   Stress at home 11/14/2018   stress w/dtr who has ovarian cancer 1/20   Tubular adenoma    UTI (urinary tract infection)    Vertigo 04/17/2018    Past Surgical History:  Procedure Laterality Date   ABDOMINAL HYSTERECTOMY  1994   fibroids   APPENDECTOMY     BUNIONECTOMY Right    BUNIONECTOMY Left 2018   CHOLECYSTECTOMY     COLONOSCOPY  12/09/2008   Dr.Rourk- normal rectum, polyp at the hepatic flexure bx= tubular adenoma. the remainder of the colonic mucosa and terminal ileum mucosa appeared normal.   COLONOSCOPY  2006   Dr.Sam Maili- colonic adenoma   COLONOSCOPY N/A 06/25/2014   Procedure: COLONOSCOPY;  Surgeon: Daneil Dolin, MD;  [facility];  Service: Endoscopy;  Laterality: N/A;  34 DR REQUEST TIME   HEMORRHOID SURGERY     KNEE ARTHROSCOPY Right    torn meniscus   ORIF TOE FRACTURE Right    rotator Cuff surgery     TUBAL LIGATION      There were no vitals filed for this visit.    Subjective Assessment - 10/01/21 1731     Subjective Lori [name] has chronic L knee pain and has previously had both knees scoped.  Her L hip and lateral leg pain is more recent.  She notes WB is worst and also notes some symptoms below the knee.    Pertinent History L foot surgery 2018, R toe surgery, previous RTC repair, previous B knee scopes    Limitations Sitting;Walking;Lifting;House hold activities;Standing    How long can you sit comfortably? 20-30 minutes (leg)    How long can you stand comfortably? 10 minutes (knee)    How long can you walk comfortably? Short distances in the house and for errands    Patient Stated Goals Decrease L knee, hip and leg pain, walk better, improve standing and walking endurance    Currently in Pain? Yes    Pain Score 5     Pain Location Leg    Pain Orientation  Left    Pain Descriptors / Indicators Aching;Constant    Pain Type Chronic pain    Pain Radiating Towards Notes symptoms occasionally to the lateral leg below the knee    Pain Onset More than a month ago    Pain Frequency Constant    Aggravating Factors  Prolonged postures, particularly WB    Pain Relieving Factors Hoping for relief with PT    Effect of Pain on Daily Activities Limits sitting, standing and walking endurance.  Can affect sleep.    Multiple Pain Sites No                [facility] PT Assessment - 10/01/21 0001       Assessment   Medical Diagnosis Chronic L knee pain, L trochanteric bursitis    Referring Provider (PT) Gregor Hams MD    Onset Date/Surgical Date --   Chronic   Next MD Visit NA    Prior Therapy Post B knee scopes      Precautions   Precautions None      Restrictions   Weight Bearing Restrictions No     Other Position/Activity Restrictions Try to avoid overuse      Balance Screen   Has the patient fallen in the past 6 months No    Has the patient had a decrease in activity level because of a fear of falling?  No    Is the patient reluctant to leave their home because of a fear of falling?  No      Home Ecologist residence    Additional Comments Avoids stairs      Prior Function   Level of Independence Independent    Vocation Retired    Chief Operating Officer, bike      Cognition   Overall Cognitive Status Within Functional Limits for tasks assessed      Observation/Other Assessments   Focus on Therapeutic Outcomes (FOTO)  51 Goal 57 in 12 visits)      ROM / Strength   AROM / PROM / Strength AROM;Strength      AROM   Overall AROM  Deficits    AROM Assessment Site Knee;Hip    Right/Left Hip Left;Right    Right Hip Flexion 95    Right Hip External Rotation  18    Right Hip Internal Rotation  13    Left Hip Flexion 95    Left Hip External Rotation  18    Left Hip Internal Rotation  15    Right/Left Knee Left;Right    Right Knee Extension 0    Right Knee Flexion 128    Left Knee Extension 0    Left Knee Flexion 118      Strength   Overall Strength Deficits    Strength Assessment Site Knee    Right/Left Knee Left;Right    Right Knee Extension --   65.7 pounds   Left Knee Extension --   52.6 pounds     Flexibility   Soft Tissue Assessment /Muscle Length yes    Hamstrings 45 degrees L/45 degrees R                        Objective measurements completed on examination: See above findings.       [facility] Adult PT Treatment/Exercise - 10/01/21 0001       Exercises   Exercises Knee/Hip      Knee/Hip Exercises: Stretches   Other  Knee/Hip Stretches Standing lumbar/trunk extension 10X 3 seconds (hips forward)    Other Knee/Hip Stretches Figure 4 stretch (into hip ER) 4X 20 seconds      Knee/Hip Exercises: Supine   Quad Sets  Strengthening;Both;2 sets;10 reps;Limitations    Quad Sets Limitations 5 seconds (toes back, press kness down and tighten thighs)                     PT Education - 10/01/21 1736     Education Details Reviewed exam findings, hip and knee anatomy with models and starter HEP.    Person(s) Educated Patient    Methods Explanation;Handout;Demonstration;Tactile cues;Verbal cues    Comprehension Verbal cues required;Need further instruction;Returned demonstration;Verbalized understanding;Tactile cues required              PT Short Term Goals - 10/01/21 1745       PT SHORT TERM GOAL #1   Title Improve hip flexibility for hip flexion to 100 degrees and hip ER to 40 degrees.    Baseline L/R in degrees: 95/95; 18/18    Time 8    Period Weeks    Status New    Target Date 11/26/21               PT Long Term Goals - 10/01/21 1747       PT LONG TERM GOAL #1   Title Improve FOTO to 57 in 12 visits.    Baseline 51    Time 8    Period Weeks    Status New    Target Date 11/26/21      PT LONG TERM GOAL #2   Title Improve L hip and knee pain to consistently 0-3/10 on the Numeric Pain Rating Scale.    Baseline Can be 4-5/10 or more    Time 8    Period Weeks    Status New    Target Date 11/26/21      PT LONG TERM GOAL #3   Title Improve B quadriceps strength to > 75 pounds.    Baseline 52-65 pounds    Time 8    Period Weeks    Status New    Target Date 11/26/21      PT LONG TERM GOAL #4   Title Winta will be independent with her long-term HEP at DC.    Baseline Started today    Time 8    Period Weeks    Status New    Target Date 11/26/21                    Plan - 10/01/21 1739     Clinical Impression Statement Tricia has chronic L knee pain.  Both knees have been previously scoped and she has a 20% strength deficit in her L quadriceps vs her R (see objective).  R quadriceps are also shy of expected normals (at least 40% body weight).  She has  B hip stiffness and limited lumbar extension (10 degrees).  Her leg symptoms appear to be more musculkar although radicular can't be completely ruled out as we ran out of time to fully assess this.  Given significant knee and hip findings, I recommend focusing on this with reassessment of her spine if progress with her current plan is not as good as expected.    Personal Factors and Comorbidities Comorbidity 3+    Comorbidities L foot surgery 2018, R toe surgery, previous RTC repair, previous B knee scopes  Examination-Activity Limitations Stairs;Stand;Bed Mobility;Bend;Sit;Sleep;Lift;Locomotion Level;Squat    Examination-Participation Restrictions Interpersonal Relationship;Community Activity    Stability/Clinical Decision Making Evolving/Moderate complexity    Clinical Decision Making Moderate    Rehab Potential Good    PT Frequency Other (comment)   1-2X/week for 8 weeks   PT Duration 8 weeks    PT Treatment/Interventions ADLs/Self Care Home Management;Electrical Stimulation;Cryotherapy;Stair training;Therapeutic activities;Neuromuscular re-education;Therapeutic exercise;Patient/family education;Manual techniques;Dry needling;Vasopneumatic Device    PT Next Visit Plan Review HEP.  Progress quadriceps strengthening, add hip stretches, bridging, postural education    PT Home Exercise Plan Access Code: [email or web address]    Consulted and Agree with Plan of Care Patient             Patient will benefit from skilled therapeutic intervention in order to improve the following deficits and impairments:  Abnormal gait, Decreased activity tolerance, Decreased endurance, Decreased range of motion, Difficulty walking, Decreased strength, Increased edema, Impaired flexibility, Impaired perceived functional ability, Postural dysfunction, Pain  Visit Diagnosis: Difficulty walking  Muscle weakness (generalized)  Localized edema  Stiffness of left knee, not elsewhere classified  Stiffness of left hip, not  elsewhere classified  Chronic pain of left knee  Pain in left hip     Problem List Patient Active Problem List   Diagnosis Date Noted   Acute non-recurrent ethmoidal sinusitis 09/10/2021   Nonvenomous snake bite 07/16/2021   Candidiasis of breast 04/28/2021   Acute medial meniscus tear, left, initial encounter 07/30/2020   Acute tear lateral meniscus, left, initial encounter 07/30/2020   Closed subchondral insufficiency fracture of condyle of left femur ([place]) 07/30/2020   Baker's cyst of knee, left 06/04/2020   Atherosclerosis of right iliac artery 05/21/2020   Arthritis of right knee 07/24/2019   Right shoulder pain 07/03/2019   Stress at home 11/14/2018   Hyperglycemia 05/08/2018   Vertigo 04/17/2018   Pyelonephritis 05/02/2017   Hyperlipidemia 04/27/2017   Personal history of colonic polyps 06/06/2014   Family history of colon cancer 06/06/2014   Osteopenia 11/04/2013    Farley Ly, PT, MPT 10/01/2021, 5:51 PM  [facility] Physical Therapy [address] [place], [place], [address] Phone: [phone]   Fax:  [phone]  Name: Lori [name] MRN: [id number] Date of Birth: 1951-07-29

## 2021-10-06 ENCOUNTER — Encounter: Payer: Self-pay | Admitting: Rehabilitative and Restorative Service Providers"

## 2021-10-06 ENCOUNTER — Other Ambulatory Visit: Payer: Self-pay

## 2021-10-06 ENCOUNTER — Ambulatory Visit: Payer: Federal, State, Local not specified - PPO | Admitting: Rehabilitative and Restorative Service Providers"

## 2021-10-06 DIAGNOSIS — R600 Localized edema: Secondary | ICD-10-CM | POA: Diagnosis not present

## 2021-10-06 DIAGNOSIS — M25662 Stiffness of left knee, not elsewhere classified: Secondary | ICD-10-CM

## 2021-10-06 DIAGNOSIS — R262 Difficulty in walking, not elsewhere classified: Secondary | ICD-10-CM | POA: Diagnosis not present

## 2021-10-06 DIAGNOSIS — M6281 Muscle weakness (generalized): Secondary | ICD-10-CM

## 2021-10-06 DIAGNOSIS — M25552 Pain in left hip: Secondary | ICD-10-CM

## 2021-10-06 DIAGNOSIS — M25652 Stiffness of left hip, not elsewhere classified: Secondary | ICD-10-CM

## 2021-10-06 DIAGNOSIS — G8929 Other chronic pain: Secondary | ICD-10-CM

## 2021-10-06 DIAGNOSIS — M25562 Pain in left knee: Secondary | ICD-10-CM

## 2021-10-06 NOTE — Patient Instructions (Signed)
Access Code: [email or web address] URL: [email or web address] Date: 10/06/2021 Prepared by: Scot Jun  Exercises Supine Quadricep Sets - 3-5 x daily - 7 x weekly - 2-3 sets - 10 reps - 5 second hold Supine Figure 4 Piriformis Stretch - 2-3 x daily - 7 x weekly - 1 sets - 5 reps - 20 seconds hold Standing Lumbar Extension at [place] - 5 x daily - 7 x weekly - 1 sets - 5 reps - 3 seconds hold Seated Straight Leg Heel Taps - 1-2 x daily - 7 x weekly - 2-3 sets - 10 reps Sit to Stand - 1 x daily - 7 x weekly - 3 sets - 10 reps

## 2021-10-06 NOTE — Therapy (Signed)
[facility] Physical Therapy [address] [place], [place], [address] Phone: [phone]   Fax:  [phone]  Physical Therapy Treatment  Patient Details  Name: Lori [name] MRN: [id number] Date of Birth: August 24, 1951 Referring Provider (PT): Gregor Hams MD   Encounter Date: 10/06/2021   PT End of Session - 10/06/21 1555     Visit Number 2    Date for PT Re-Evaluation 11/26/21    Progress Note Due on Visit 12    PT Start Time 5631    PT Stop Time 1556    PT Time Calculation (min) 41 min    Activity Tolerance Patient tolerated treatment well    Behavior During Therapy [facility] for tasks assessed/performed             Past Medical History:  Diagnosis Date   Acute pain of left shoulder 07/03/2019   Arthritis of right knee 07/24/2019   Atypical chest pain 11/14/2018   Chronic pain of right knee 05/22/2019   CTS (carpal tunnel syndrome)    chronic   Depression 01/16/2013   Family history of colon cancer 06/06/2014   Headache 06/28/2019   History of colon polyps    Hyperglycemia 05/08/2018   Hyperlipidemia 04/27/2017   Left arm numbness 11/21/2017   Left wrist pain 07/03/2019   Osteopenia    Personal history of colonic polyps 06/06/2014   PONV (postoperative nausea and vomiting)    Pyelonephritis 05/02/2017   Stress at home 11/14/2018   stress w/dtr who has ovarian cancer 1/20   Tubular adenoma    UTI (urinary tract infection)    Vertigo 04/17/2018    Past Surgical History:  Procedure Laterality Date   ABDOMINAL HYSTERECTOMY  1994   fibroids   APPENDECTOMY     BUNIONECTOMY Right    BUNIONECTOMY Left 2018   CHOLECYSTECTOMY     COLONOSCOPY  12/09/2008   Dr.Rourk- normal rectum, polyp at the hepatic flexure bx= tubular adenoma. the remainder of the colonic mucosa and terminal ileum mucosa appeared normal.   COLONOSCOPY  2006   Dr.Sam [place]- colonic adenoma   COLONOSCOPY N/A 06/25/2014   Procedure: COLONOSCOPY;  Surgeon: Daneil Dolin, MD;  Location[facility];  Service: Endoscopy;  Laterality: N/A;  68 DR REQUEST TIME   HEMORRHOID SURGERY     KNEE ARTHROSCOPY Right    torn meniscus   ORIF TOE FRACTURE Right    rotator Cuff surgery     TUBAL LIGATION      There were no vitals filed for this visit.   Subjective Assessment - 10/06/21 1513     Subjective Pt. indicated feeling better today and felt improvement since last visit.  Still felt things with stairs.    Pertinent History Lt foot surgery 2018, Rt toe surgery, previous RTC repair, previous bilateral  knee scopes    Limitations Sitting;Walking;Lifting;House hold activities;Standing    Patient Stated Goals Decrease Lt knee, hip and leg pain, walk better, improve standing and walking endurance    Currently in Pain? No/denies   no pain upon arrival   Pain Score 7    at worst   Pain Location Leg    Pain Orientation Left    Pain Descriptors / Indicators Aching    Pain Type Chronic pain    Pain Onset More than a month ago    Pain Frequency Intermittent    Aggravating Factors  stairs, tub transfers    Pain Relieving Factors felt some improvement with HEP  [facility] Adult PT Treatment/Exercise - 10/06/21 0001       Exercises   Exercises Other Exercises      Knee/Hip Exercises: Aerobic   Nustep Lvl 5 10 mins UE/LE      Knee/Hip Exercises: Machines for Strengthening   Total Gym Leg Press single leg 31 lbs 2 x10 bilateral      Knee/Hip Exercises: Seated   Other Seated Knee/Hip Exercises seated slr 2 x 10 bilateral                     PT Education - 10/06/21 1605     Education Details HEP progression.    Person(s) Educated Patient    Methods Explanation;Demonstration;Verbal cues;Handout    Comprehension Verbalized understanding;Returned demonstration              PT Short Term Goals - 10/06/21 1556       PT SHORT TERM GOAL #1   Title Improve hip flexibility for hip flexion to 100 degrees and hip ER to 40  degrees.    Time 8    Period Weeks    Status On-going    Target Date 11/26/21               PT Long Term Goals - 10/01/21 1747       PT LONG TERM GOAL #1   Title Improve FOTO to 57 in 12 visits.    Baseline 51    Time 8    Period Weeks    Status New    Target Date 11/26/21      PT LONG TERM GOAL #2   Title Improve L hip and knee pain to consistently 0-3/10 on the Numeric Pain Rating Scale.    Baseline Can be 4-5/10 or more    Time 8    Period Weeks    Status New    Target Date 11/26/21      PT LONG TERM GOAL #3   Title Improve B quadriceps strength to > 75 pounds.    Baseline 52-65 pounds    Time 8    Period Weeks    Status New    Target Date 11/26/21      PT LONG TERM GOAL #4   Title Deloros will be independent with her long-term HEP at DC.    Baseline Started today    Time 8    Period Weeks    Status New    Target Date 11/26/21                   Plan - 10/06/21 1546     Clinical Impression Statement Pt. indicated subjective improvement in symptoms overall.  Conitnued emphasis on overall strengthening to improve biomechanics in movement.  Good overall tolerance.  Discussed and education given on inclusion of new HEP addition as well as return to walking based off symptom response.  Continued skilled PT services warranted at this time.    Personal Factors and Comorbidities Comorbidity 3+    Comorbidities Lt foot surgery 2018, Rt toe surgery, previous RTC repair, previous bilateral knee scopes    Examination-Activity Limitations Stairs;Stand;Bed Mobility;Bend;Sit;Sleep;Lift;Locomotion Level;Squat    Examination-Participation Restrictions Interpersonal Relationship;Community Activity    Stability/Clinical Decision Making Evolving/Moderate complexity    Rehab Potential Good    PT Frequency Other (comment)   1-2X/week for 8 weeks   PT Duration 8 weeks    PT Treatment/Interventions ADLs/Self Care Home Management;Electrical Stimulation;Cryotherapy;Stair  training;Therapeutic activities;Neuromuscular re-education;Therapeutic exercise;Patient/family education;Manual techniques;Dry needling;Vasopneumatic  Device    PT Next Visit Plan Review HEP again, progressive quad strengthening.    PT Home Exercise Plan Access Code: [email or web address]    [email or web address] and Agree with Plan of Care Patient             Patient will benefit from skilled therapeutic intervention in order to improve the following deficits and impairments:  Abnormal gait, Decreased activity tolerance, Decreased endurance, Decreased range of motion, Difficulty walking, Decreased strength, Increased edema, Impaired flexibility, Impaired perceived functional ability, Postural dysfunction, Pain  Visit Diagnosis: Difficulty walking  Muscle weakness (generalized)  Localized edema  Stiffness of left knee, not elsewhere classified  Stiffness of left hip, not elsewhere classified  Chronic pain of left knee  Pain in left hip     Problem List Patient Active Problem List   Diagnosis Date Noted   Acute non-recurrent ethmoidal sinusitis 09/10/2021   Nonvenomous snake bite 07/16/2021   Candidiasis of breast 04/28/2021   Acute medial meniscus tear, left, initial encounter 07/30/2020   Acute tear lateral meniscus, left, initial encounter 07/30/2020   Closed subchondral insufficiency fracture of condyle of left femur ([place]) 07/30/2020   Baker's cyst of knee, left 06/04/2020   Atherosclerosis of right iliac artery 05/21/2020   Arthritis of right knee 07/24/2019   Right shoulder pain 07/03/2019   Stress at home 11/14/2018   Hyperglycemia 05/08/2018   Vertigo 04/17/2018   Pyelonephritis 05/02/2017   Hyperlipidemia 04/27/2017   Personal history of colonic polyps 06/06/2014   Family history of colon cancer 06/06/2014   Osteopenia 11/04/2013   Scot Jun, PT, DPT, OCS, ATC 10/06/21  4:07 PM    [facility] [address] [place], [place],  [address] Phone: [phone]   Fax:  [phone]  Name: KAMIRYN BEZANSON MRN: [id number] Date of Birth: 1951-02-21

## 2021-10-08 ENCOUNTER — Encounter: Payer: Self-pay | Admitting: Physical Therapy

## 2021-10-08 ENCOUNTER — Other Ambulatory Visit: Payer: Self-pay

## 2021-10-08 ENCOUNTER — Ambulatory Visit: Payer: Federal, State, Local not specified - PPO | Admitting: Physical Therapy

## 2021-10-08 DIAGNOSIS — G8929 Other chronic pain: Secondary | ICD-10-CM

## 2021-10-08 DIAGNOSIS — R600 Localized edema: Secondary | ICD-10-CM | POA: Diagnosis not present

## 2021-10-08 DIAGNOSIS — M25662 Stiffness of left knee, not elsewhere classified: Secondary | ICD-10-CM

## 2021-10-08 DIAGNOSIS — M6281 Muscle weakness (generalized): Secondary | ICD-10-CM | POA: Diagnosis not present

## 2021-10-08 DIAGNOSIS — R262 Difficulty in walking, not elsewhere classified: Secondary | ICD-10-CM

## 2021-10-08 DIAGNOSIS — M25652 Stiffness of left hip, not elsewhere classified: Secondary | ICD-10-CM

## 2021-10-08 DIAGNOSIS — M25562 Pain in left knee: Secondary | ICD-10-CM

## 2021-10-08 DIAGNOSIS — M25552 Pain in left hip: Secondary | ICD-10-CM

## 2021-10-08 NOTE — Therapy (Signed)
[facility] Physical Therapy [address] [place], [place], [address] Phone: [phone]   Fax:  [phone]  Physical Therapy Treatment  Patient Details  Name: Lori [name] MRN: [id number] Date of Birth: Mar 20, 1951 Referring Provider (PT): Gregor Hams MD   Encounter Date: 10/08/2021   PT End of Session - 10/08/21 0840     Visit Number 3    Date for PT Re-Evaluation 11/26/21    Progress Note Due on Visit 12    PT Start Time 0803    PT Stop Time 5053    PT Time Calculation (min) 41 min    Activity Tolerance Patient tolerated treatment well    Behavior During Therapy [facility] for tasks assessed/performed             Past Medical History:  Diagnosis Date   Acute pain of left shoulder 07/03/2019   Arthritis of right knee 07/24/2019   Atypical chest pain 11/14/2018   Chronic pain of right knee 05/22/2019   CTS (carpal tunnel syndrome)    chronic   Depression 01/16/2013   Family history of colon cancer 06/06/2014   Headache 06/28/2019   History of colon polyps    Hyperglycemia 05/08/2018   Hyperlipidemia 04/27/2017   Left arm numbness 11/21/2017   Left wrist pain 07/03/2019   Osteopenia    Personal history of colonic polyps 06/06/2014   PONV (postoperative nausea and vomiting)    Pyelonephritis 05/02/2017   Stress at home 11/14/2018   stress w/dtr who has ovarian cancer 1/20   Tubular adenoma    UTI (urinary tract infection)    Vertigo 04/17/2018    Past Surgical History:  Procedure Laterality Date   ABDOMINAL HYSTERECTOMY  1994   fibroids   APPENDECTOMY     BUNIONECTOMY Right    BUNIONECTOMY Left 2018   CHOLECYSTECTOMY     COLONOSCOPY  12/09/2008   Dr.Rourk- normal rectum, polyp at the hepatic flexure bx= tubular adenoma. the remainder of the colonic mucosa and terminal ileum mucosa appeared normal.   COLONOSCOPY  2006   Dr.Sam [place]- colonic adenoma   COLONOSCOPY N/A 06/25/2014   Procedure: COLONOSCOPY;  Surgeon: Daneil Dolin, MD;  [facility];  Service: Endoscopy;  Laterality: N/A;  60 DR REQUEST TIME   HEMORRHOID SURGERY     KNEE ARTHROSCOPY Right    torn meniscus   ORIF TOE FRACTURE Right    rotator Cuff surgery     TUBAL LIGATION      There were no vitals filed for this visit.   Subjective Assessment - 10/08/21 0813     Subjective Pt. indicated feeling good upon arrival with no pain at the moment but she does still have pain with stairs and getting up from the floor    Pertinent History Lt foot surgery 2018, Rt toe surgery, previous RTC repair, previous bilateral  knee scopes    Limitations Sitting;Walking;Lifting;House hold activities;Standing    How long can you sit comfortably? 20-30 minutes (leg)    How long can you stand comfortably? 10 minutes (knee)    How long can you walk comfortably? Short distances in the house and for errands    Patient Stated Goals Decrease Lt knee, hip and leg pain, walk better, improve standing and walking endurance    Pain Onset More than a month ago              [facility] Adult PT Treatment/Exercise - 10/08/21 0001       Knee/Hip  Exercises: Aerobic   Recumbent Bike L1 X 10 min, had to go reverse first 4 minutes until knee loosened up then went fwd      Knee/Hip Exercises: Machines for Strengthening   Total Gym Leg Press DL 75# 2X10, SL 31# 2X10 bilat      Knee/Hip Exercises: Standing   Lateral Step Up Both;10 reps;Hand Hold: 1;Step Height: 4"    Forward Step Up Both;10 reps;Hand Hold: 1;Step Height: 4"      Knee/Hip Exercises: Seated   Long Arc Quad Both;2 sets;10 reps    Long Arc Quad Weight 3 lbs.                       PT Short Term Goals - 10/06/21 1556       PT SHORT TERM GOAL #1   Title Improve hip flexibility for hip flexion to 100 degrees and hip ER to 40 degrees.    Time 8    Period Weeks    Status On-going    Target Date 11/26/21               PT Long Term Goals - 10/01/21 1747       PT LONG TERM GOAL #1   Title Improve FOTO  to 57 in 12 visits.    Baseline 51    Time 8    Period Weeks    Status New    Target Date 11/26/21      PT LONG TERM GOAL #2   Title Improve L hip and knee pain to consistently 0-3/10 on the Numeric Pain Rating Scale.    Baseline Can be 4-5/10 or more    Time 8    Period Weeks    Status New    Target Date 11/26/21      PT LONG TERM GOAL #3   Title Improve B quadriceps strength to > 75 pounds.    Baseline 52-65 pounds    Time 8    Period Weeks    Status New    Target Date 11/26/21      PT LONG TERM GOAL #4   Title Matti will be independent with her long-term HEP at [place].    Baseline Started today    Time 8    Period Weeks    Status New    Target Date 11/26/21                   Plan - 10/08/21 0841     Clinical Impression Statement She has responded well with PT. Her chief complaint is pain with stairs so we broke this down into 4 inch step ups to begin and she was able to do this without pain and difficulty. We will plan to increase the height or reps with this as she progresses.    Personal Factors and Comorbidities Comorbidity 3+    Comorbidities Lt foot surgery 2018, Rt toe surgery, previous RTC repair, previous bilateral knee scopes    Examination-Activity Limitations Stairs;Stand;Bed Mobility;Bend;Sit;Sleep;Lift;Locomotion Level;Squat    Examination-Participation Restrictions Interpersonal Relationship;Community Activity    Stability/Clinical Decision Making Evolving/Moderate complexity    Rehab Potential Good    PT Frequency Other (comment)   1-2X/week for 8 weeks   PT Duration 8 weeks    PT Treatment/Interventions ADLs/Self Care Home Management;Electrical Stimulation;Cryotherapy;Stair training;Therapeutic activities;Neuromuscular re-education;Therapeutic exercise;Patient/family education;Manual techniques;Dry needling;Vasopneumatic Device    PT Next Visit Plan progressive quad strengthening. work on improving ability with stairs    PT  Home Exercise Plan  Access Code: [email or web address]    [email or web address] and Agree with Plan of Care Patient             Patient will benefit from skilled therapeutic intervention in order to improve the following deficits and impairments:  Abnormal gait, Decreased activity tolerance, Decreased endurance, Decreased range of motion, Difficulty walking, Decreased strength, Increased edema, Impaired flexibility, Impaired perceived functional ability, Postural dysfunction, Pain  Visit Diagnosis: Difficulty walking  Muscle weakness (generalized)  Localized edema  Stiffness of left knee, not elsewhere classified  Stiffness of left hip, not elsewhere classified  Chronic pain of left knee  Pain in left hip     Problem List Patient Active Problem List   Diagnosis Date Noted   Acute non-recurrent ethmoidal sinusitis 09/10/2021   Nonvenomous snake bite 07/16/2021   Candidiasis of breast 04/28/2021   Acute medial meniscus tear, left, initial encounter 07/30/2020   Acute tear lateral meniscus, left, initial encounter 07/30/2020   Closed subchondral insufficiency fracture of condyle of left femur ([place]) 07/30/2020   Baker's cyst of knee, left 06/04/2020   Atherosclerosis of right iliac artery 05/21/2020   Arthritis of right knee 07/24/2019   Right shoulder pain 07/03/2019   Stress at home 11/14/2018   Hyperglycemia 05/08/2018   Vertigo 04/17/2018   Pyelonephritis 05/02/2017   Hyperlipidemia 04/27/2017   Personal history of colonic polyps 06/06/2014   Family history of colon cancer 06/06/2014   Osteopenia 11/04/2013    Debbe Odea, PT,DPT 10/08/2021, 8:45 AM  [facility] Physical Therapy [address] [place], [place], [address] Phone: [phone]   Fax:  [phone]  Name: JAYANNA KROEGER MRN: [id number] Date of Birth: 11-27-1950

## 2021-10-12 ENCOUNTER — Other Ambulatory Visit: Payer: Self-pay | Admitting: Cardiology

## 2021-10-13 LAB — HM MAMMOGRAPHY

## 2021-10-22 ENCOUNTER — Other Ambulatory Visit: Payer: Self-pay

## 2021-10-22 ENCOUNTER — Ambulatory Visit: Payer: Federal, State, Local not specified - PPO | Admitting: Physical Therapy

## 2021-10-22 DIAGNOSIS — R262 Difficulty in walking, not elsewhere classified: Secondary | ICD-10-CM

## 2021-10-22 DIAGNOSIS — M25662 Stiffness of left knee, not elsewhere classified: Secondary | ICD-10-CM

## 2021-10-22 DIAGNOSIS — R600 Localized edema: Secondary | ICD-10-CM | POA: Diagnosis not present

## 2021-10-22 DIAGNOSIS — M6281 Muscle weakness (generalized): Secondary | ICD-10-CM

## 2021-10-22 NOTE — Therapy (Signed)
[facility] Physical Therapy [address] [place], [place], [address] Phone: [phone]   Fax:  [phone]  Physical Therapy Treatment  Patient Details  Name: Lori [name] MRN: [id number] Date of Birth: 11-04-1950 Referring Provider (PT): Lori Hams MD   Encounter Date: 10/22/2021   PT End of Session - 10/22/21 0852     Visit Number 4    Date for PT Re-Evaluation 11/26/21    Progress Note Due on Visit 12    PT Start Time 0804    PT Stop Time 0254    PT Time Calculation (min) 48 min    Activity Tolerance Patient tolerated treatment well    Behavior During Therapy [facility] for tasks assessed/performed             Past Medical History:  Diagnosis Date   Acute pain of left shoulder 07/03/2019   Arthritis of right knee 07/24/2019   Atypical chest pain 11/14/2018   Chronic pain of right knee 05/22/2019   CTS (carpal tunnel syndrome)    chronic   Depression 01/16/2013   Family history of colon cancer 06/06/2014   Headache 06/28/2019   History of colon polyps    Hyperglycemia 05/08/2018   Hyperlipidemia 04/27/2017   Left arm numbness 11/21/2017   Left wrist pain 07/03/2019   Osteopenia    Personal history of colonic polyps 06/06/2014   PONV (postoperative nausea and vomiting)    Pyelonephritis 05/02/2017   Stress at home 11/14/2018   stress w/dtr who has ovarian cancer 1/20   Tubular adenoma    UTI (urinary tract infection)    Vertigo 04/17/2018    Past Surgical History:  Procedure Laterality Date   ABDOMINAL HYSTERECTOMY  1994   fibroids   APPENDECTOMY     BUNIONECTOMY Right    BUNIONECTOMY Left 2018   CHOLECYSTECTOMY     COLONOSCOPY  12/09/2008   Dr.Rourk- normal rectum, polyp at the hepatic flexure bx= tubular adenoma. the remainder of the colonic mucosa and terminal ileum mucosa appeared normal.   COLONOSCOPY  2006   Dr.Sam [name]- colonic adenoma   COLONOSCOPY N/A 06/25/2014   Procedure: COLONOSCOPY;  Surgeon: Lori Dolin, MD;  [facility];  Service: Endoscopy;  Laterality: N/A;  50 DR REQUEST TIME   HEMORRHOID SURGERY     KNEE ARTHROSCOPY Right    torn meniscus   ORIF TOE FRACTURE Right    rotator Cuff surgery     TUBAL LIGATION      There were no vitals filed for this visit.   Subjective Assessment - 10/22/21 0845     Subjective Pt. relays she took the stairs instead of elevator but still with pain with this, she also relays the pain keeps her from walking more than 10 minutes    Pertinent History Lt foot surgery 2018, Rt toe surgery, previous RTC repair, previous bilateral  knee scopes    Limitations Sitting;Walking;Lifting;House hold activities;Standing    How long can you sit comfortably? 20-30 minutes (leg)    How long can you stand comfortably? 10 minutes (knee)    How long can you walk comfortably? Short distances in the house and for errands    Patient Stated Goals Decrease Lt knee, hip and leg pain, walk better, improve standing and walking endurance    Pain Onset More than a month ago                [facility] Adult PT Treatment/Exercise - 10/22/21 0001  Knee/Hip Exercises: Stretches   Press photographer Both;3 reps;30 seconds      Knee/Hip Exercises: Aerobic   Nustep Lvl 5 10 mins UE/LE      Knee/Hip Exercises: Machines for Strengthening   Total Gym Leg Press DL 75# X20, SL 37# X20      Knee/Hip Exercises: Standing   Knee Flexion Left;20 reps    Knee Flexion Limitations 3#    Hip Abduction Left;20 reps    Abduction Limitations 3#    Forward Step Up Both;10 reps;Hand Hold: 2;Step Height: 6"    Other Standing Knee Exercises Lt TKE green X15      Knee/Hip Exercises: Seated   Long Arc Quad Both;20 reps    Long Arc Quad Weight 3 lbs.                       PT Short Term Goals - 10/06/21 1556       PT SHORT TERM GOAL #1   Title Improve hip flexibility for hip flexion to 100 degrees and hip ER to 40 degrees.    Time 8    Period Weeks    Status On-going    Target Date  11/26/21               PT Long Term Goals - 10/01/21 1747       PT LONG TERM GOAL #1   Title Improve FOTO to 57 in 12 visits.    Baseline 51    Time 8    Period Weeks    Status New    Target Date 11/26/21      PT LONG TERM GOAL #2   Title Improve L hip and knee pain to consistently 0-3/10 on the Numeric Pain Rating Scale.    Baseline Can be 4-5/10 or more    Time 8    Period Weeks    Status New    Target Date 11/26/21      PT LONG TERM GOAL #3   Title Improve B quadriceps strength to > 75 pounds.    Baseline 52-65 pounds    Time 8    Period Weeks    Status New    Target Date 11/26/21      PT LONG TERM GOAL #4   Title Lori [name] will be independent with her long-term HEP at [place].    Baseline Started today    Time 8    Period Weeks    Status New    Target Date 11/26/21                   Plan - 10/22/21 6063     Clinical Impression Statement We are progressing her strength program and she does have good tolerance with this however still has complaints of pain with stairs during the knee extension phase when ascending. I recommended she avoid stairs for now until we improve her pain and strength and then she can resume them. We will continue to break down stairs into smaller simulated strengthening exercises and I did add TKE today and provide her with green resistance band to take home.    Personal Factors and Comorbidities Comorbidity 3+    Comorbidities Lt foot surgery 2018, Rt toe surgery, previous RTC repair, previous bilateral knee scopes    Examination-Activity Limitations Stairs;Stand;Bed Mobility;Bend;Sit;Sleep;Lift;Locomotion Level;Squat    Examination-Participation Restrictions Interpersonal Relationship;Community Activity    Stability/Clinical Decision Making Evolving/Moderate complexity    Rehab Potential Good    PT  Frequency Other (comment)   1-2X/week for 8 weeks   PT Duration 8 weeks    PT Treatment/Interventions ADLs/Self Care Home  Management;Electrical Stimulation;Cryotherapy;Stair training;Therapeutic activities;Neuromuscular re-education;Therapeutic exercise;Patient/family education;Manual techniques;Dry needling;Vasopneumatic Device    PT Next Visit Plan progressive quad strengthening. work on improving ability with stairs    PT Home Exercise Plan Access Code: [email or web address]    Consulted and Agree with Plan of Care Patient             Patient will benefit from skilled therapeutic intervention in order to improve the following deficits and impairments:  Abnormal gait, Decreased activity tolerance, Decreased endurance, Decreased range of motion, Difficulty walking, Decreased strength, Increased edema, Impaired flexibility, Impaired perceived functional ability, Postural dysfunction, Pain  Visit Diagnosis: Difficulty walking  Muscle weakness (generalized)  Localized edema  Stiffness of left knee, not elsewhere classified     Problem List Patient Active Problem List   Diagnosis Date Noted   Acute non-recurrent ethmoidal sinusitis 09/10/2021   Nonvenomous snake bite 07/16/2021   Candidiasis of breast 04/28/2021   Acute medial meniscus tear, left, initial encounter 07/30/2020   Acute tear lateral meniscus, left, initial encounter 07/30/2020   Closed subchondral insufficiency fracture of condyle of left femur ([place]) 07/30/2020   Baker's cyst of knee, left 06/04/2020   Atherosclerosis of right iliac artery 05/21/2020   Arthritis of right knee 07/24/2019   Right shoulder pain 07/03/2019   Stress at home 11/14/2018   Hyperglycemia 05/08/2018   Vertigo 04/17/2018   Pyelonephritis 05/02/2017   Hyperlipidemia 04/27/2017   Personal history of colonic polyps 06/06/2014   Family history of colon cancer 06/06/2014   Osteopenia 11/04/2013    Debbe Odea, PT,DPT 10/22/2021, 8:58 AM  [facility] Physical Therapy [address] [place], [place], [address] Phone: [phone]   Fax:   [phone]  Name: Lori [name] MRN: [id number] Date of Birth: [birth year]

## 2021-10-26 ENCOUNTER — Encounter: Payer: Self-pay | Admitting: Internal Medicine

## 2021-10-27 ENCOUNTER — Encounter: Payer: Federal, State, Local not specified - PPO | Admitting: Rehabilitative and Restorative Service Providers"

## 2021-10-29 ENCOUNTER — Ambulatory Visit: Payer: Federal, State, Local not specified - PPO | Admitting: Rehabilitative and Restorative Service Providers"

## 2021-10-29 ENCOUNTER — Other Ambulatory Visit: Payer: Self-pay

## 2021-10-29 ENCOUNTER — Encounter: Payer: Self-pay | Admitting: Rehabilitative and Restorative Service Providers"

## 2021-10-29 DIAGNOSIS — M6281 Muscle weakness (generalized): Secondary | ICD-10-CM

## 2021-10-29 DIAGNOSIS — M25662 Stiffness of left knee, not elsewhere classified: Secondary | ICD-10-CM | POA: Diagnosis not present

## 2021-10-29 DIAGNOSIS — R262 Difficulty in walking, not elsewhere classified: Secondary | ICD-10-CM

## 2021-10-29 DIAGNOSIS — G8929 Other chronic pain: Secondary | ICD-10-CM

## 2021-10-29 DIAGNOSIS — R600 Localized edema: Secondary | ICD-10-CM

## 2021-10-29 DIAGNOSIS — M25562 Pain in left knee: Secondary | ICD-10-CM

## 2021-10-29 NOTE — Patient Instructions (Addendum)
Access Code: [email or web address] URL: [email or web address] Date: 10/29/2021 Prepared by: Vista Mink  Exercises Supine Quadricep Sets - 3-5 x daily - 3 x weekly - 2-3 sets - 10 reps - 5 second hold Supine Figure 4 Piriformis Stretch - 2 x daily - 7 x weekly - 1 sets - 5 reps - 20 seconds hold Standing Lumbar Extension at [place] - 5 x daily - 7 x weekly - 1 sets - 5 reps - 3 seconds hold Sit to Stand - 1 x daily - 3 x weekly - 2-3 sets - 10 reps Seated Straight Leg Raise - 1-2 x daily - 3 x weekly - 6-10 sets - 5 reps - 3 seconds hold Tandem Stance - 1 x daily - 3 x weekly - 1 sets - 4-5 reps - 20 second hold

## 2021-10-29 NOTE — Therapy (Signed)
[facility] Physical Therapy [address] [place], [place], [address] Phone: [phone]   Fax:  [phone]  Physical Therapy Treatment  Patient Details  Name: Lori [name] MRN: [id number] Date of Birth: 1951/02/17 Referring Provider (PT): Gregor Hams MD   Encounter Date: 10/29/2021   PT End of Session - 10/29/21 1722     Visit Number 5    Number of Visits 12    Date for PT Re-Evaluation 11/26/21    Progress Note Due on Visit 12    PT Start Time 1048    PT Stop Time 2595    PT Time Calculation (min) 57 min    Activity Tolerance Patient tolerated treatment well;Patient limited by fatigue    Behavior During Therapy [facility] for tasks assessed/performed             Past Medical History:  Diagnosis Date   Acute pain of left shoulder 07/03/2019   Arthritis of right knee 07/24/2019   Atypical chest pain 11/14/2018   Chronic pain of right knee 05/22/2019   CTS (carpal tunnel syndrome)    chronic   Depression 01/16/2013   Family history of colon cancer 06/06/2014   Headache 06/28/2019   History of colon polyps    Hyperglycemia 05/08/2018   Hyperlipidemia 04/27/2017   Left arm numbness 11/21/2017   Left wrist pain 07/03/2019   Osteopenia    Personal history of colonic polyps 06/06/2014   PONV (postoperative nausea and vomiting)    Pyelonephritis 05/02/2017   Stress at home 11/14/2018   stress w/dtr who has ovarian cancer 1/20   Tubular adenoma    UTI (urinary tract infection)    Vertigo 04/17/2018    Past Surgical History:  Procedure Laterality Date   ABDOMINAL HYSTERECTOMY  1994   fibroids   APPENDECTOMY     BUNIONECTOMY Right    BUNIONECTOMY Left 2018   CHOLECYSTECTOMY     COLONOSCOPY  12/09/2008   Dr.Rourk- normal rectum, polyp at the hepatic flexure bx= tubular adenoma. the remainder of the colonic mucosa and terminal ileum mucosa appeared normal.   COLONOSCOPY  2006   Dr.Sam [place]- colonic adenoma   COLONOSCOPY N/A 06/25/2014   Procedure: COLONOSCOPY;   Surgeon: Daneil Dolin, MD;  [facility];  Service: Endoscopy;  Laterality: N/A;  88 DR REQUEST TIME   HEMORRHOID SURGERY     KNEE ARTHROSCOPY Right    torn meniscus   ORIF TOE FRACTURE Right    rotator Cuff surgery     TUBAL LIGATION      There were no vitals filed for this visit.   Subjective Assessment - 10/29/21 1052     Subjective Alesa notes little pain.  Her focus is on getting stronger.  Edema is less later in the day as compared to evaluation.    Pertinent History Lt foot surgery 2018, Rt toe surgery, previous RTC repair, previous bilateral  knee scopes    Limitations Sitting;Walking;Lifting;House hold activities;Standing    How long can you sit comfortably? 20-30 minutes (leg)    How long can you stand comfortably? As long as she needs to (was 10 minutes knee)    How long can you walk comfortably? 15 minutes (was short distances in the house and for errands)    Patient Stated Goals Decrease Lt knee, hip and leg pain, walk better, improve standing and walking endurance    Currently in Pain? No/denies    Pain Score 0-No pain    Pain Location Leg  Pain Orientation Left    Pain Descriptors / Indicators Aching    Pain Type Chronic pain    Pain Radiating Towards NA    Pain Onset More than a month ago    Pain Frequency Occasional    Aggravating Factors  Stairs and prolonged sitting    Pain Relieving Factors Home exercises    Effect of Pain on Daily Activities Limits sitting, standing and walking endurance.  Sleep is now fine.    Multiple Pain Sites No                [facility] PT Assessment - 10/29/21 0001       ROM / Strength   AROM / PROM / Strength AROM      AROM   Overall AROM  Deficits    AROM Assessment Site Knee;Hip    Right/Left Hip Left;Right    Right Hip External Rotation  21    Left Hip External Rotation  33    Right/Left Knee Left;Right    Right Knee Extension 0    Right Knee Flexion 131    Left Knee Extension 0    Left Knee Flexion  125                           [facility] Adult PT Treatment/Exercise - 10/29/21 0001       Therapeutic Activites    Therapeutic Activities ADL's    ADL's Step-down off 2 and 4 inch step with slow eccentrics (for stairs and eccentric quadriceps control)      Neuro Re-ed    Neuro Re-ed Details  Tandem balance: eyes open; head turning and eyes closed 3X each 20 seconds      Exercises   Exercises Knee/Hip      Knee/Hip Exercises: Stretches   Other Knee/Hip Stretches Standing lumbar/trunk extension 10X 3 seconds (hips forward)    Other Knee/Hip Stretches Figure 4 stretch (into hip ER) 4X 20 seconds      Knee/Hip Exercises: Aerobic   Nustep Level 5 for 8 minutes      Knee/Hip Exercises: Machines for Strengthening   Total Gym Leg Press Next visit      Knee/Hip Exercises: Seated   Other Seated Knee/Hip Exercises Seated straight leg raises 3 sets of 5 with 1# weight slow eccentrics      Knee/Hip Exercises: Supine   Quad Sets Strengthening;Both;2 sets;10 reps;Limitations    Quad Sets Limitations 5 seconds (toes back, press knees down and tighten thighs)                     PT Education - 10/29/21 1720     Education Details Reviewed early objective progress and updated HEP.    Person(s) Educated Patient    Methods Explanation;Demonstration;Verbal cues;Handout;Tactile cues    Comprehension Verbalized understanding;Need further instruction;Returned demonstration;Verbal cues required;Tactile cues required              PT Short Term Goals - 10/06/21 1556       PT SHORT TERM GOAL #1   Title Improve hip flexibility for hip flexion to 100 degrees and hip ER to 40 degrees.    Time 8    Period Weeks    Status On-going    Target Date 11/26/21               PT Long Term Goals - 10/29/21 1721       PT LONG TERM GOAL #1  Title Improve FOTO to 57 in 12 visits.    Baseline 59 (was 51)    Time 8    Period Weeks    Status Achieved    Target Date  11/26/21      PT LONG TERM GOAL #2   Title Improve L hip and knee pain to consistently 0-3/10 on the Numeric Pain Rating Scale.    Baseline Can be 4/10 or more    Time 8    Period Weeks    Status On-going    Target Date 11/26/21      PT LONG TERM GOAL #3   Title Improve B quadriceps strength to > 75 pounds.    Baseline 52-65 pounds at eval, will be reassessed in 2-4 weeks    Time 8    Period Weeks    Status On-going    Target Date 11/26/21      PT LONG TERM GOAL #4   Title Yatziri will be independent with her long-term HEP at DC.    Baseline Updated today    Time 8    Period Weeks    Status On-going    Target Date 11/26/21                   Plan - 10/29/21 1723     Clinical Impression Statement Elizette notes gains in her strength and progress with stairs.  Pain is not the primary complaint, weakness and difficulty with endurance and stairs are.  Although she is making early subjective and FOTO progress, Paullette would like continued help to improve her starength and endurance to make her more functional before transfer into independent rehabilitation.    Personal Factors and Comorbidities Comorbidity 3+    Comorbidities Lt foot surgery 2018, Rt toe surgery, previous RTC repair, previous bilateral knee scopes    Examination-Activity Limitations Stairs;Stand;Bed Mobility;Bend;Sit;Sleep;Lift;Locomotion Level;Squat    Examination-Participation Restrictions Interpersonal Relationship;Community Activity    Stability/Clinical Decision Making Evolving/Moderate complexity    Rehab Potential Good    PT Frequency Other (comment)   1-2X/week for 8 weeks   PT Duration 8 weeks    PT Treatment/Interventions ADLs/Self Care Home Management;Electrical Stimulation;Cryotherapy;Stair training;Therapeutic activities;Neuromuscular re-education;Therapeutic exercise;Patient/family education;Manual techniques;Dry needling;Vasopneumatic Device    PT Next Visit Plan Progressive quadriceps, hip  abductors and general LE strengthening, balance, work on improving ability with stairs (slow eccentrics)    PT Home Exercise Plan Access Code: [email or web address]    Consulted and Agree with Plan of Care Patient             Patient will benefit from skilled therapeutic intervention in order to improve the following deficits and impairments:  Abnormal gait, Decreased activity tolerance, Decreased endurance, Decreased range of motion, Difficulty walking, Decreased strength, Increased edema, Impaired flexibility, Impaired perceived functional ability, Postural dysfunction, Pain  Visit Diagnosis: Difficulty walking  Muscle weakness (generalized)  Localized edema  Stiffness of left knee, not elsewhere classified  Chronic pain of left knee     Problem List Patient Active Problem List   Diagnosis Date Noted   Acute non-recurrent ethmoidal sinusitis 09/10/2021   Nonvenomous snake bite 07/16/2021   Candidiasis of breast 04/28/2021   Acute medial meniscus tear, left, initial encounter 07/30/2020   Acute tear lateral meniscus, left, initial encounter 07/30/2020   Closed subchondral insufficiency fracture of condyle of left femur ([place]) 07/30/2020   Baker's cyst of knee, left 06/04/2020   Atherosclerosis of right iliac artery 05/21/2020   Arthritis of right knee 07/24/2019   Right  shoulder pain 07/03/2019   Stress at home 11/14/2018   Hyperglycemia 05/08/2018   Vertigo 04/17/2018   Pyelonephritis 05/02/2017   Hyperlipidemia 04/27/2017   Personal history of colonic polyps 06/06/2014   Family history of colon cancer 06/06/2014   Osteopenia 11/04/2013    Farley Ly, PT, MPT 10/29/2021, 5:26 PM  [facility] Physical Therapy [address] [place], [place], [address] Phone: [phone]   Fax:  [phone]  Name: MARCELLE HEPNER MRN: [id number] Date of Birth: 1950-11-14

## 2021-11-03 ENCOUNTER — Other Ambulatory Visit: Payer: Self-pay

## 2021-11-03 ENCOUNTER — Encounter: Payer: Self-pay | Admitting: Rehabilitative and Restorative Service Providers"

## 2021-11-03 ENCOUNTER — Ambulatory Visit: Payer: Federal, State, Local not specified - PPO | Admitting: Rehabilitative and Restorative Service Providers"

## 2021-11-03 DIAGNOSIS — M25662 Stiffness of left knee, not elsewhere classified: Secondary | ICD-10-CM

## 2021-11-03 DIAGNOSIS — R262 Difficulty in walking, not elsewhere classified: Secondary | ICD-10-CM | POA: Diagnosis not present

## 2021-11-03 DIAGNOSIS — R600 Localized edema: Secondary | ICD-10-CM | POA: Diagnosis not present

## 2021-11-03 DIAGNOSIS — M6281 Muscle weakness (generalized): Secondary | ICD-10-CM

## 2021-11-03 NOTE — Therapy (Signed)
[facility] Physical Therapy [address] [place], [place], [address] Phone: [phone]   Fax:  [phone]  Physical Therapy Treatment  Patient Details  Name: Lori [name] MRN: [id number] Date of Birth: 1951-07-05 Referring Provider (PT): Gregor Hams MD   Encounter Date: 11/03/2021   PT End of Session - 11/03/21 1149     Visit Number 6    Number of Visits 12    Date for PT Re-Evaluation 11/26/21    Progress Note Due on Visit 12    PT Start Time 1055    PT Stop Time 1140    PT Time Calculation (min) 45 min    Activity Tolerance Patient tolerated treatment well    Behavior During Therapy [facility] for tasks assessed/performed             Past Medical History:  Diagnosis Date   Acute pain of left shoulder 07/03/2019   Arthritis of right knee 07/24/2019   Atypical chest pain 11/14/2018   Chronic pain of right knee 05/22/2019   CTS (carpal tunnel syndrome)    chronic   Depression 01/16/2013   Family history of colon cancer 06/06/2014   Headache 06/28/2019   History of colon polyps    Hyperglycemia 05/08/2018   Hyperlipidemia 04/27/2017   Left arm numbness 11/21/2017   Left wrist pain 07/03/2019   Osteopenia    Personal history of colonic polyps 06/06/2014   PONV (postoperative nausea and vomiting)    Pyelonephritis 05/02/2017   Stress at home 11/14/2018   stress w/dtr who has ovarian cancer 1/20   Tubular adenoma    UTI (urinary tract infection)    Vertigo 04/17/2018    Past Surgical History:  Procedure Laterality Date   ABDOMINAL HYSTERECTOMY  1994   fibroids   APPENDECTOMY     BUNIONECTOMY Right    BUNIONECTOMY Left 2018   CHOLECYSTECTOMY     COLONOSCOPY  12/09/2008   Dr.Rourk- normal rectum, polyp at the hepatic flexure bx= tubular adenoma. the remainder of the colonic mucosa and terminal ileum mucosa appeared normal.   COLONOSCOPY  2006   Dr.Sam [place]- colonic adenoma   COLONOSCOPY N/A 06/25/2014   Procedure: COLONOSCOPY;  Surgeon: Daneil Dolin,  MD;  [facility];  Service: Endoscopy;  Laterality: N/A;  39 DR REQUEST TIME   HEMORRHOID SURGERY     KNEE ARTHROSCOPY Right    torn meniscus   ORIF TOE FRACTURE Right    rotator Cuff surgery     TUBAL LIGATION      There were no vitals filed for this visit.   Subjective Assessment - 11/03/21 1120     Subjective Lori [name] notes stairs are getting better.  She reports good HEP compliance.    Pertinent History Lt foot surgery 2018, Rt toe surgery, previous RTC repair, previous bilateral  knee scopes    Limitations Sitting;Walking;Lifting;House hold activities;Standing    How long can you sit comfortably? 20-30 minutes (leg)    How long can you stand comfortably? As long as she needs to (was 10 minutes knee)    How long can you walk comfortably? 15 minutes (was short distances in the house and for errands)    Patient Stated Goals Decrease Lt knee, hip and leg pain, walk better, improve standing and walking endurance    Currently in Pain? No/denies    Pain Score 0-No pain    Pain Location Knee    Pain Orientation Left    Pain Type Chronic pain  Pain Radiating Towards NA    Pain Onset More than a month ago    Pain Frequency Rarely    Aggravating Factors  Stairs and prolonged sitting    Pain Relieving Factors Exercises    Effect of Pain on Daily Activities Stiffness with prolonged sitting, can walk 15+ minutes    Multiple Pain Sites No                               [facility] Adult PT Treatment/Exercise - 11/03/21 0001       Therapeutic Activites    Therapeutic Activities ADL's    ADL's Step-down off 4 inch step with slow eccentrics (for stairs and eccentric quadriceps control) & step-up and over slow eccentrics off a 6 inch step      Neuro Re-ed    Neuro Re-ed Details  Tandem balance: eyes open; head turning and eyes closed 3X each 20 seconds      Exercises   Exercises Knee/Hip      Knee/Hip Exercises: Stretches   Other Knee/Hip Stretches  Standing lumbar/trunk extension 10X 3 seconds (hips forward)    Other Knee/Hip Stretches Figure 4 stretch (into hip ER) 4X 20 seconds      Knee/Hip Exercises: Seated   Other Seated Knee/Hip Exercises Seated straight leg raises 3 sets of 5 with 1# weight (2 sets) and 1.5# weight (last set) slow eccentrics                     PT Education - 11/03/21 1148     Education Details Reviewed HEP and progressed step exercises.  Added weight to seated SLR.    Person(s) Educated Patient    Methods Explanation;Demonstration;Tactile cues;Verbal cues    Comprehension Verbalized understanding;Tactile cues required;Need further instruction;Returned demonstration;Verbal cues required              PT Short Term Goals - 10/06/21 1556       PT SHORT TERM GOAL #1   Title Improve hip flexibility for hip flexion to 100 degrees and hip ER to 40 degrees.    Time 8    Period Weeks    Status On-going    Target Date 11/26/21               PT Long Term Goals - 10/29/21 1721       PT LONG TERM GOAL #1   Title Improve FOTO to 57 in 12 visits.    Baseline 59 (was 51)    Time 8    Period Weeks    Status Achieved    Target Date 11/26/21      PT LONG TERM GOAL #2   Title Improve L hip and knee pain to consistently 0-3/10 on the Numeric Pain Rating Scale.    Baseline Can be 4/10 or more    Time 8    Period Weeks    Status On-going    Target Date 11/26/21      PT LONG TERM GOAL #3   Title Improve B quadriceps strength to > 75 pounds.    Baseline 52-65 pounds at eval, will be reassessed in 2-4 weeks    Time 8    Period Weeks    Status On-going    Target Date 11/26/21      PT LONG TERM GOAL #4   Title Lori [name] will be independent with her long-term HEP at DC.    Baseline Updated  today    Time 8    Period Weeks    Status On-going    Target Date 11/26/21                   Plan - 11/03/21 1150     Clinical Impression Statement Lori [name] notes stairs continue to get  easier.  Tailgate knee flexion exercise is being used to address start-up stiffness.  Given her good progress, we will again use FOTO and RA next week to assess progress towards long-term goals (just did last week).    Personal Factors and Comorbidities Comorbidity 3+    Comorbidities Lt foot surgery 2018, Rt toe surgery, previous RTC repair, previous bilateral knee scopes    Examination-Activity Limitations Stairs;Stand;Bed Mobility;Bend;Sit;Sleep;Lift;Locomotion Level;Squat    Examination-Participation Restrictions Interpersonal Relationship;Community Activity    Stability/Clinical Decision Making Evolving/Moderate complexity    Rehab Potential Good    PT Frequency Other (comment)   1-2X/week for 8 weeks   PT Duration 8 weeks    PT Treatment/Interventions ADLs/Self Care Home Management;Electrical Stimulation;Cryotherapy;Stair training;Therapeutic activities;Neuromuscular re-education;Therapeutic exercise;Patient/family education;Manual techniques;Dry needling;Vasopneumatic Device    PT Next Visit Plan RA/FOTO.  DC?    PT Home Exercise Plan Access Code: [email or web address]    Consulted and Agree with Plan of Care Patient             Patient will benefit from skilled therapeutic intervention in order to improve the following deficits and impairments:  Abnormal gait, Decreased activity tolerance, Decreased endurance, Decreased range of motion, Difficulty walking, Decreased strength, Increased edema, Impaired flexibility, Impaired perceived functional ability, Postural dysfunction, Pain  Visit Diagnosis: Difficulty walking  Muscle weakness (generalized)  Localized edema  Stiffness of left knee, not elsewhere classified     Problem List Patient Active Problem List   Diagnosis Date Noted   Acute non-recurrent ethmoidal sinusitis 09/10/2021   Nonvenomous snake bite 07/16/2021   Candidiasis of breast 04/28/2021   Acute medial meniscus tear, left, initial encounter 07/30/2020   Acute tear  lateral meniscus, left, initial encounter 07/30/2020   Closed subchondral insufficiency fracture of condyle of left femur ([place]) 07/30/2020   Baker's cyst of knee, left 06/04/2020   Atherosclerosis of right iliac artery 05/21/2020   Arthritis of right knee 07/24/2019   Right shoulder pain 07/03/2019   Stress at home 11/14/2018   Hyperglycemia 05/08/2018   Vertigo 04/17/2018   Pyelonephritis 05/02/2017   Hyperlipidemia 04/27/2017   Personal history of colonic polyps 06/06/2014   Family history of colon cancer 06/06/2014   Osteopenia 11/04/2013    Farley Ly, PT, MPT 11/03/2021, 11:53 AM  [facility] Physical Therapy [address] [place], [place], [address] Phone: [phone]   Fax:  [phone]  Name: Lori [name] MRN: [id number] Date of Birth: 1951/09/16

## 2021-11-03 NOTE — Patient Instructions (Signed)
Access Code: [email or web address] URL: [email or web address] Date: 11/03/2021 Prepared by: Vista Mink  Exercises Supine Quadricep Sets - 3-5 x daily - 3 x weekly - 2-3 sets - 10 reps - 5 second hold Supine Figure 4 Piriformis Stretch - 2 x daily - 7 x weekly - 1 sets - 5 reps - 20 seconds hold Standing Lumbar Extension at [place] - 5 x daily - 7 x weekly - 1 sets - 5 reps - 3 seconds hold Sit to Stand - 1 x daily - 3 x weekly - 2-3 sets - 10 reps Seated Straight Leg Raise - 1-2 x daily - 3 x weekly - 6-10 sets - 5 reps - 3 seconds hold Tandem Stance - 1 x daily - 3 x weekly - 1 sets - 4-5 reps - 20 second hold

## 2021-11-05 ENCOUNTER — Encounter: Payer: Federal, State, Local not specified - PPO | Admitting: Rehabilitative and Restorative Service Providers"

## 2021-11-10 ENCOUNTER — Other Ambulatory Visit: Payer: Self-pay

## 2021-11-10 ENCOUNTER — Encounter: Payer: Self-pay | Admitting: Physical Therapy

## 2021-11-10 ENCOUNTER — Ambulatory Visit: Payer: Federal, State, Local not specified - PPO | Admitting: Physical Therapy

## 2021-11-10 DIAGNOSIS — M6281 Muscle weakness (generalized): Secondary | ICD-10-CM | POA: Diagnosis not present

## 2021-11-10 DIAGNOSIS — M25662 Stiffness of left knee, not elsewhere classified: Secondary | ICD-10-CM

## 2021-11-10 DIAGNOSIS — R262 Difficulty in walking, not elsewhere classified: Secondary | ICD-10-CM | POA: Diagnosis not present

## 2021-11-10 NOTE — Therapy (Signed)
[facility] Physical Therapy [address] [place], [place], [address] Phone: [phone]   Fax:  [phone]  Physical Therapy Treatment & Discharge Summary  Patient Details  Name: Lori [name] MRN: [id number] Date of Birth: [birth year] Referring Provider (PT): Gregor Hams MD   Encounter Date: 11/10/2021  PHYSICAL THERAPY DISCHARGE SUMMARY  Visits from Start of Care: 7  Current functional level related to goals / functional outcomes: See below   Remaining deficits: See below   Education / Equipment: HEP & ongoing fitness   Patient agrees to discharge. Patient goals were met. Patient is being discharged due to meeting the stated rehab goals.    PT End of Session - 11/10/21 1148     Visit Number 7    Number of Visits 12    Date for PT Re-Evaluation 11/26/21    Progress Note Due on Visit 12    PT Start Time 1107    PT Stop Time 1145    PT Time Calculation (min) 38 min    Activity Tolerance Patient tolerated treatment well    Behavior During Therapy [facility] for tasks assessed/performed             Past Medical History:  Diagnosis Date   Acute pain of left shoulder 07/03/2019   Arthritis of right knee 07/24/2019   Atypical chest pain 11/14/2018   Chronic pain of right knee 05/22/2019   CTS (carpal tunnel syndrome)    chronic   Depression 01/16/2013   Family history of colon cancer 06/06/2014   Headache 06/28/2019   History of colon polyps    Hyperglycemia 05/08/2018   Hyperlipidemia 04/27/2017   Left arm numbness 11/21/2017   Left wrist pain 07/03/2019   Osteopenia    Personal history of colonic polyps 06/06/2014   PONV (postoperative nausea and vomiting)    Pyelonephritis 05/02/2017   Stress at home 11/14/2018   stress w/dtr who has ovarian cancer 1/20   Tubular adenoma    UTI (urinary tract infection)    Vertigo 04/17/2018    Past Surgical History:  Procedure Laterality Date   ABDOMINAL HYSTERECTOMY  1994   fibroids   APPENDECTOMY      BUNIONECTOMY Right    BUNIONECTOMY Left 2018   CHOLECYSTECTOMY     COLONOSCOPY  12/09/2008   Dr.Rourk- normal rectum, polyp at the hepatic flexure bx= tubular adenoma. the remainder of the colonic mucosa and terminal ileum mucosa appeared normal.   COLONOSCOPY  2006   Dr.Sam Leavenworth- colonic adenoma   COLONOSCOPY N/A 06/25/2014   Procedure: COLONOSCOPY;  Surgeon: Daneil Dolin, MD;  Location: [facility];  Service: Endoscopy;  Laterality: N/A;  75 DR REQUEST TIME   HEMORRHOID SURGERY     KNEE ARTHROSCOPY Right    torn meniscus   ORIF TOE FRACTURE Right    rotator Cuff surgery     TUBAL LIGATION      There were no vitals filed for this visit.   Subjective Assessment - 11/10/21 1107     Subjective She feels like she is much better.  She takes the stairs now instead of elevator. She walking in neighborhood for 25 minutes without issues.    Pertinent History Lt foot surgery 2018, Rt toe surgery, previous RTC repair, previous bilateral  knee scopes    Limitations Sitting;Walking;Lifting;House hold activities;Standing    How long can you sit comfortably? 20-30 minutes (leg)    How long can you stand comfortably? As long as she needs to (was  10 minutes knee)    How long can you walk comfortably? 15 minutes (was short distances in the house and for errands)    Patient Stated Goals Decrease Lt knee, hip and leg pain, walk better, improve standing and walking endurance    Currently in Pain? No/denies    Pain Onset More than a month ago                [facility] PT Assessment - 11/10/21 1108       Assessment   Medical Diagnosis Chronic L knee pain, L trochanteric bursitis    Referring Provider (PT) Gregor Hams MD      Observation/Other Assessments   Focus on Therapeutic Outcomes (FOTO)  77%   initial was 51% functional.     Strength   Right Knee Extension 5/5   79.2#   Left Knee Extension 5/5   75.8#     Ambulation/Gait   Assistive device None    Gait Pattern Within  Functional Limits    Gait velocity 3.89 ft/sec comfortable pace,  5.45 ft/sec fast pace    Stairs Yes    Stairs Assistance 7: Independent    Stair Management Technique No rails;Alternating pattern;Forwards    Number of Stairs 11    Height of Stairs 7      Functional Gait  Assessment   Gait assessed  Yes    Gait Level Surface Walks 20 ft in less than 5.5 sec, no assistive devices, good speed, no evidence for imbalance, normal gait pattern, deviates no more than 6 in outside of the 12 in walkway width.    Change in Gait Speed Able to smoothly change walking speed without loss of balance or gait deviation. Deviate no more than 6 in outside of the 12 in walkway width.    Gait with Horizontal Head Turns Performs head turns smoothly with no change in gait. Deviates no more than 6 in outside 12 in walkway width    Gait with Vertical Head Turns Performs head turns with no change in gait. Deviates no more than 6 in outside 12 in walkway width.    Gait and Pivot Turn Pivot turns safely within 3 sec and stops quickly with no loss of balance.    Step Over Obstacle Is able to step over one shoe box (4.5 in total height) without changing gait speed. No evidence of imbalance.    Gait with Narrow Base of Support Is able to ambulate for 10 steps heel to toe with no staggering.    Gait with Eyes Closed Walks 20 ft, no assistive devices, good speed, no evidence of imbalance, normal gait pattern, deviates no more than 6 in outside 12 in walkway width. Ambulates 20 ft in less than 7 sec.    Ambulating Backwards Walks 20 ft, no assistive devices, good speed, no evidence for imbalance, normal gait    Steps Alternating feet, no rail.    Total Score 29                           [facility] Adult PT Treatment/Exercise - 11/10/21 1108       Therapeutic Activites    Therapeutic Activities ADL's    ADL's Step-down off 4 inch step with slow eccentrics (for stairs and eccentric quadriceps control) & step-up  and over slow eccentrics off a 6 inch step      Neuro Re-ed    Neuro Re-ed Details  Tandem balance:  eyes open; head turning and eyes closed 3X each 20 seconds      Exercises   Exercises Knee/Hip      Knee/Hip Exercises: Stretches   Other Knee/Hip Stretches Standing lumbar/trunk extension 10X 3 seconds (hips forward)    Other Knee/Hip Stretches Figure 4 stretch (into hip ER) 4X 20 seconds      Knee/Hip Exercises: Machines for Strengthening   Total Gym Leg Press BLEs 125# 15 reps.      Knee/Hip Exercises: Seated   Other Seated Knee/Hip Exercises Seated straight leg raises 3 sets of 5 with 1# weight (2 sets) and 1.5# weight (last set) slow eccentrics                     PT Education - 11/10/21 1149     Education Details educated on well rounded fitness plan includes exercises to address endurance, flexibility, strength & balance    Person(s) Educated Patient    Methods Explanation;Verbal cues    Comprehension Verbalized understanding              PT Short Term Goals - 10/06/21 1556       PT SHORT TERM GOAL #1   Title Improve hip flexibility for hip flexion to 100 degrees and hip ER to 40 degrees.    Time 8    Period Weeks    Status On-going    Target Date 11/26/21               PT Long Term Goals - 11/10/21 1148       PT LONG TERM GOAL #1   Title Improve FOTO to 57 in 12 visits.    Baseline 77%    Time 8    Period Weeks    Status Achieved    Target Date 11/26/21      PT LONG TERM GOAL #2   Title Improve L hip and knee pain to consistently 0-3/10 on the Numeric Pain Rating Scale.    Baseline no pain with activities    Time 8    Period Weeks    Status Achieved    Target Date 11/26/21      PT LONG TERM GOAL #3   Title Improve B quadriceps strength to > 75 pounds.    Time 8    Period Weeks    Status Achieved    Target Date 11/26/21      PT LONG TERM GOAL #4   Title Chatara will be independent with her long-term HEP at [facility].    Baseline  Updated today    Time 8    Period Weeks    Status Achieved    Target Date 11/26/21                   Plan - 11/10/21 1150     Clinical Impression Statement patient met all LTGs. She reports significant improvement in her activity. 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  Her gait velocity and Functional Gait Assessment both indicate low fall risk and functioning at community level.    Personal Factors and Comorbidities Comorbidity 3+    Comorbidities Lt foot surgery 2018, Rt toe surgery, previous RTC repair, previous bilateral knee scopes    Examination-Activity Limitations Stairs;Stand;Bed Mobility;Bend;Sit;Sleep;Lift;Locomotion Level;Squat    Examination-Participation Restrictions Interpersonal Relationship;Community Activity    Stability/Clinical Decision Making Evolving/Moderate complexity    Rehab  Potential Good    PT Frequency Other (comment)   1-2X/week for 8 weeks   PT Duration 8 weeks    PT Treatment/Interventions ADLs/Self Care Home Management;Electrical Stimulation;Cryotherapy;Stair training;Therapeutic activities;Neuromuscular re-education;Therapeutic exercise;Patient/family education;Manual techniques;Dry needling;Vasopneumatic Device    PT Next Visit Plan discharge PT    PT Home Exercise Plan Access Code: [email or web address]    Consulted and Agree with Plan of Care Patient             Patient will benefit from skilled therapeutic intervention in order to improve the following deficits and impairments:  Abnormal gait, Decreased activity tolerance, Decreased endurance, Decreased range of motion, Difficulty walking, Decreased strength, Increased edema, Impaired flexibility, Impaired perceived functional ability, Postural dysfunction, Pain  Visit Diagnosis: Difficulty walking  Muscle weakness (generalized)  Stiffness of left knee, not elsewhere classified     Problem  List Patient Active Problem List   Diagnosis Date Noted   Acute non-recurrent ethmoidal sinusitis 09/10/2021   Nonvenomous snake bite 07/16/2021   Candidiasis of breast 04/28/2021   Acute medial meniscus tear, left, initial encounter 07/30/2020   Acute tear lateral meniscus, left, initial encounter 07/30/2020   Closed subchondral insufficiency fracture of condyle of left femur ([place]) 07/30/2020   Baker's cyst of knee, left 06/04/2020   Atherosclerosis of right iliac artery 05/21/2020   Arthritis of right knee 07/24/2019   Right shoulder pain 07/03/2019   Stress at home 11/14/2018   Hyperglycemia 05/08/2018   Vertigo 04/17/2018   Pyelonephritis 05/02/2017   Hyperlipidemia 04/27/2017   Personal history of colonic polyps 06/06/2014   Family history of colon cancer 06/06/2014   Osteopenia 11/04/2013    Jamey Reas, PT, DPT 11/10/2021, 11:57 AM  [facility] Physical Therapy [address] [place], [place], [address] Phone: [phone]   Fax:  [phone]  Name: ROOSEVELT BISHER MRN: [id number] Date of Birth: 11/23/1950

## 2021-11-12 ENCOUNTER — Encounter: Payer: Federal, State, Local not specified - PPO | Admitting: Rehabilitative and Restorative Service Providers"

## 2021-12-06 ENCOUNTER — Encounter: Payer: Self-pay | Admitting: Internal Medicine

## 2021-12-06 NOTE — Progress Notes (Signed)
duplicate

## 2021-12-08 ENCOUNTER — Other Ambulatory Visit: Payer: Self-pay

## 2021-12-08 ENCOUNTER — Ambulatory Visit (INDEPENDENT_AMBULATORY_CARE_PROVIDER_SITE_OTHER): Payer: Medicare Other | Admitting: Family Medicine

## 2021-12-08 ENCOUNTER — Encounter: Payer: Self-pay | Admitting: Family Medicine

## 2021-12-08 VITALS — BP 140/80 | HR 72 | Ht 64.0 in | Wt 173.2 lb

## 2021-12-08 DIAGNOSIS — G8929 Other chronic pain: Secondary | ICD-10-CM | POA: Diagnosis not present

## 2021-12-08 DIAGNOSIS — M25562 Pain in left knee: Secondary | ICD-10-CM | POA: Diagnosis not present

## 2021-12-08 DIAGNOSIS — S83232D Complex tear of medial meniscus, current injury, left knee, subsequent encounter: Secondary | ICD-10-CM

## 2021-12-08 NOTE — Patient Instructions (Addendum)
Good to see you today.  I've referred you to Orthopedics ([name]).  Their office will call you to schedule but please let us know if you haven't heard from them in one week regarding scheduling.  Follow-up as needed.

## 2021-12-08 NOTE — Progress Notes (Signed)
I, Lori [name], LAT, ATC, am serving as scribe for Dr. Lynne [name].  Lori [name] is a 71 y.o. female who presents to [place] at [facility] today for f/u of L knee pain due to DJD. She was last seen by Dr. Georgina [name] on 09/23/21 and was referred to PT of which she has completed 7 sessions and been d/c.  She also had a L knee steroid injection on 08/02/21.  Today, pt reports that her L knee con't to bother her.  She con't to limp.  She has been riding her stationary bike.  She said that through PT she was able to climb stairs but overall feels like her L knee is not right.  It con't to swell intermittently.  Diagnostic testing: L knee XR- 05/06/21, 04/02/20; L knee MRI- 06/02/20  Pertinent review of systems: No fevers or chills  Relevant historical information: History of left knee subchondroplasty and medial and lateral meniscus debridement 2021 Dr. Erlinda [name].  3 right knee meniscus debridement Dr. Berenice [name] 2012   Exam:  BP 140/80 (BP Location: Left Arm, Patient Position: Sitting, Cuff Size: Normal)    Pulse 72    Ht 5\' 4"  (1.626 m)    Wt 173 lb 3.2 oz (78.6 kg)    SpO2 97%    BMI 29.73 kg/m  General: Well Developed, well nourished, and in no acute distress.   MSK: Left knee moderate effusion normal motion with crepitation.    Lab and Radiology Results  EXAM: LEFT KNEE 3 VIEWS   COMPARISON:  04/02/2020   FINDINGS: Frontal, lateral, and sunrise views of the left knee are obtained. There is stable mild 3 compartmental osteoarthritis greatest in the medial and patellofemoral compartments. Since the previous exam, sclerosis has developed within the medial femoral condyle, concerning for underlying nondisplaced stress fracture. Correlation with MRI may be useful for further characterization. Small joint effusion. Soft tissues are unremarkable.   IMPRESSION: 1. Interval development of sclerosis within the medial femoral condyle, concerning for nondisplaced stress fracture.  Correlation with MRI is recommended. 2. Stable mild 3 compartmental osteoarthritis. 3. Small joint effusion.   These results will be called to the ordering clinician or representative by the Radiologist Assistant, and communication documented in the PACS or [employer].     Electronically Signed   By: Lori [name] M.D.   On: 05/08/2021 18:07   EXAM: MRI OF THE LEFT KNEE WITHOUT CONTRAST   TECHNIQUE: Multiplanar, multisequence MR imaging of the knee was performed. No intravenous contrast was administered.   COMPARISON:  04/02/2020 radiographs   FINDINGS: MENISCI   Medial meniscus: Large radial tear of the root of the posterior horn medial meniscus, image 14/9. Amorphous degenerative signal in much of the remaining posterior horn and midbody questionable extension to the superior meniscal surface on image 9/10, potentially from additional degenerative tearing.   Lateral meniscus: Oblique grade 3 surface tear of the anterior horn with horizontal extension towards the free edge for example on images 20 through 22 of series 10. This tear likely extends into the anterior root.   LIGAMENTS   Cruciates:  Unremarkable   Collaterals: Mildly thickened proximal MCL with adjacent edema suggesting low-grade sprain   CARTILAGE   Patellofemoral: Moderate degenerative chondral thinning along the medial patellar facet. Chondral thinning centrally along the femoral trochlear groove (especially inferiorly) and along the medial femoral trochlear groove.   Medial: Moderate to prominent degenerative chondral thinning along the medial femoral condyle with moderate chondral thinning medially  along the medial tibial plateau.   Lateral:  Mild marginal spurring.   Joint: Small knee effusion with associated synovitis. No current gas visible in the joint.   Popliteal Fossa: Ruptured Baker's cyst with some complexity or synovitis for example on image 16/6 and image 10/8. This  could possibly reflect blood products. I favor that the plantaris is probably intact although some of the appearance resembles findings encountered in plantaris tear including fluid signal between the soleus medial head gastrocnemius.   Extensor Mechanism: Prepatellar subcutaneous edema, otherwise unremarkable.   Bones: No significant extra-articular osseous abnormalities identified.   Other: No supplemental non-categorized findings.   IMPRESSION: 1. Large radial tear of the root of the posterior horn medial meniscus. Probable adjacent degenerative tearing the posterior horn. 2. Oblique grade 3 surface tear of the anterior horn lateral meniscus with horizontal extension towards the free edge. This tear likely extends into the anterior root. 3. Mildly thickened proximal MCL with adjacent edema suggesting low-grade sprain. 4. Moderate to prominent degenerative chondral thinning in the medial compartment and medially in the patellofemoral joint. 5. Small knee effusion with associated synovitis. 6. Ruptured Baker's cyst with some complexity or synovitis. Suspect that the fluid signal between the lateral head gastrocnemius and soleus is probably from ruptured Baker's cyst rather than a plantaris tear.     Electronically Signed   By: [name]   On: 06/02/2020 10:32    I, Lori [name], personally (independently) visualized and performed the interpretation of the images attached in this note.    Assessment and Plan: 71 y.o. female with persistent left knee pain.  Lori [name] at this point is failing conservative management.  She last had a steroid injection October 2022.  She has not had hyaluronic acid injections yet but has had trials of physical therapy.  At this point she is frustrated and annoyed at her knee and ready to be " just done with it". I think it is very likely that she is having progression of her underlying knee arthritis is the main source of her knee  pain and discomfort and fundamentally I think would benefit from surgical consultation and likely a knee replacement. Plan to refer her back to orthopedic surgery for consultation for surgical options including knee replacement.  When I asked her about this she would like to see Dr. Berenice [name]. I think is very likely that Dr. Julianne Rice schedule want to get his own x-rays so will not update her x-rays today.  Last knee x-ray was July 2022.  Recheck back with me as needed.  Happy to proceed with hyaluronic acid injections if Dr. Berenice [name] thinks that is helpful.   PDMP not reviewed this encounter. Orders Placed This Encounter  Procedures   Ambulatory referral to Orthopedic Surgery    Referral Priority:   Routine    Referral Type:   Surgical    Referral Reason:   Specialty Services Required    Referred to Provider:   Dorna Leitz, MD    Requested Specialty:   Orthopedic Surgery    Number of Visits Requested:   1   No orders of the defined types were placed in this encounter.    Discussed warning signs or symptoms. Please see discharge instructions. Patient expresses understanding.   The above documentation has been reviewed and is accurate and complete Lori [name], M.D.

## 2021-12-14 DIAGNOSIS — M25562 Pain in left knee: Secondary | ICD-10-CM | POA: Diagnosis not present

## 2021-12-14 DIAGNOSIS — M1712 Unilateral primary osteoarthritis, left knee: Secondary | ICD-10-CM | POA: Diagnosis not present

## 2021-12-15 DIAGNOSIS — R6882 Decreased libido: Secondary | ICD-10-CM | POA: Diagnosis not present

## 2021-12-15 DIAGNOSIS — N898 Other specified noninflammatory disorders of vagina: Secondary | ICD-10-CM | POA: Diagnosis not present

## 2021-12-23 ENCOUNTER — Other Ambulatory Visit: Payer: Self-pay

## 2021-12-23 ENCOUNTER — Ambulatory Visit (INDEPENDENT_AMBULATORY_CARE_PROVIDER_SITE_OTHER): Payer: Medicare Other

## 2021-12-23 ENCOUNTER — Ambulatory Visit (INDEPENDENT_AMBULATORY_CARE_PROVIDER_SITE_OTHER): Payer: Medicare Other | Admitting: Nurse Practitioner

## 2021-12-23 VITALS — BP 128/72 | HR 89 | Temp 98.7°F | Ht 64.0 in | Wt 170.4 lb

## 2021-12-23 DIAGNOSIS — R051 Acute cough: Secondary | ICD-10-CM | POA: Diagnosis not present

## 2021-12-23 DIAGNOSIS — R062 Wheezing: Secondary | ICD-10-CM

## 2021-12-23 DIAGNOSIS — R059 Cough, unspecified: Secondary | ICD-10-CM | POA: Diagnosis not present

## 2021-12-23 MED ORDER — ALBUTEROL SULFATE HFA 108 (90 BASE) MCG/ACT IN AERS: 2.0000 | INHALATION_SPRAY | Freq: Four times a day (QID) | RESPIRATORY_TRACT | 0 refills | Status: DC | PRN

## 2021-12-23 MED ORDER — HYDROCODONE BIT-HOMATROP MBR 5-1.5 MG/5ML PO SOLN: 5.0000 mL | Freq: Two times a day (BID) | ORAL | 0 refills | Status: DC | PRN

## 2021-12-23 MED ORDER — LEVOFLOXACIN 750 MG PO TABS: 750.0000 mg | ORAL_TABLET | Freq: Every day | ORAL | 0 refills | Status: DC

## 2021-12-23 MED ORDER — BENZONATATE 100 MG PO CAPS: 100.0000 mg | ORAL_CAPSULE | Freq: Two times a day (BID) | ORAL | 0 refills | Status: DC | PRN

## 2021-12-23 NOTE — Progress Notes (Signed)
Subjective:  Patient ID: Lori [name], female    DOB: 1951-05-20  Age: 71 y.o. MRN: [id number]  CC:  Chief Complaint  Patient presents with   Cough    Chest tightness with coughing x 5 days      HPI  This patient arrives today for the above.  Symptoms started about 5 days ago.  She is experiencing cough, fatigue, and was spiking a fever up to 101.5 F over the weekend.  She denies shortness of breath or chest pain.  She does experience some chest wall pain with coughing.  Her cough is dry.  She took an at-home COVID-19 test which was negative.  She has not been around any sick contacts as far she knows.  Denies muscle aches.  She has tried Robitussin over-the-counter with no improvement in her symptoms.  She tells me in the past she is taking Hycodan cough syrup which helps suppress coughing.  I do see evidence of this in the chart.  Per controlled substance database she has not had any opioid medication prescribed since October 2021.  Past Medical History:  Diagnosis Date   Acute pain of left shoulder 07/03/2019   Arthritis of right knee 07/24/2019   Atypical chest pain 11/14/2018   Chronic pain of right knee 05/22/2019   CTS (carpal tunnel syndrome)    chronic   Depression 01/16/2013   Family history of colon cancer 06/06/2014   Headache 06/28/2019   History of colon polyps    Hyperglycemia 05/08/2018   Hyperlipidemia 04/27/2017   Left arm numbness 11/21/2017   Left wrist pain 07/03/2019   Osteopenia    Personal history of colonic polyps 06/06/2014   PONV (postoperative nausea and vomiting)    Pyelonephritis 05/02/2017   Stress at home 11/14/2018   stress w/dtr who has ovarian cancer 1/20   Tubular adenoma    UTI (urinary tract infection)    Vertigo 04/17/2018      Family History  Problem Relation Age of Onset   Bipolar disorder Sister    Schizophrenia Sister    Kidney disease Sister    Hypertension Sister    Bipolar disorder Sister    Kidney disease Sister     Hypertension Mother    Mental illness Mother    Dementia Mother    Pancreatic cancer Mother    Cancer Father 55       colon; bladder   Hypertension Father    Heart disease Father    Arthritis Father    Prostate cancer Father    Esophageal cancer Father    Stomach cancer Father    Colon cancer Father    Kidney disease Father    Bladder Cancer Father    Ovarian cancer Daughter    Bipolar disorder Son    Liver cancer Neg Hx     Social History   Social History Narrative   Exercising regularly - senior exercises, walking   Has 2 children.   Social History   Tobacco Use   Smoking status: Former    Packs/day: 1.00    Years: 10.00    Pack years: 10.00    Types: Cigarettes    Start date: 04/13/1971    Quit date: 11/01/1993    Years since quitting: 28.1   Smokeless tobacco: Never  Substance Use Topics   Alcohol use: Yes    Alcohol/week: 0.0 standard drinks    Comment: wine     Current Meds  Medication Sig  albuterol (VENTOLIN HFA) 108 (90 Base) MCG/ACT inhaler Inhale 2 puffs into the lungs every 6 (six) hours as needed for wheezing or shortness of breath.   Ascorbic Acid (VITAMIN C) 500 MG CHEW Chew 1 tablet by mouth daily.   b complex vitamins capsule Take 1 capsule by mouth daily.   benzonatate (TESSALON) 100 MG capsule Take 1 capsule (100 mg total) by mouth 2 (two) times daily as needed for cough.   Calcium Carbonate-Vitamin D 600-400 MG-UNIT tablet Take 1 tablet by mouth daily.   Cholecalciferol (VITAMIN D3) 50 MCG (2000 UT) TABS Take 2,000 Units by mouth daily.    COD LIVER OIL PO Take 1 capsule by mouth daily.    conjugated estrogens (PREMARIN) vaginal cream Place 1 Applicatorful vaginally as needed.   Ginkgo Biloba 60 MG CAPS Take 60 mg by mouth daily.    HYDROcodone bit-homatropine (HYCODAN) 5-1.5 MG/5ML syrup Take 5 mLs by mouth every 12 (twelve) hours as needed for cough.   levofloxacin (LEVAQUIN) 750 MG tablet Take 1 tablet (750 mg total) by mouth daily.    Misc Natural Products (TART CHERRY ADVANCED) CAPS Take 1 capsule by mouth daily.    Multiple Vitamins-Minerals (ONE-A-DAY WOMENS 50 PLUS PO) Take 1 tablet by mouth.    Psyllium (METAMUCIL FIBER PO) Take 8 capsules by mouth. Patient takes up 8 capsules daily   pyridOXINE (VITAMIN B-6) 100 MG tablet Take 100 mg by mouth daily.   Rhubarb (ESTROVEN COMPLETE PO) Take 1 tablet by mouth daily.    rosuvastatin (CRESTOR) 20 MG tablet TAKE 1 TABLET(20 MG) BY MOUTH DAILY   Turmeric (QC TUMERIC COMPLEX PO) Take 1 tablet by mouth daily.    vitamin B-12 (CYANOCOBALAMIN) 500 MCG tablet Take 500 mcg by mouth daily.   Vitamin E 180 MG CAPS Take 180 mg by mouth daily.     ROS:  Review of Systems  Constitutional:  Positive for chills and fever (Sunday and Monday 101.5).  Respiratory:  Positive for cough and wheezing. Negative for sputum production and shortness of breath.   Cardiovascular:  Negative for chest pain.  Musculoskeletal:  Negative for myalgias.  Neurological:  Negative for dizziness and headaches.    Objective:   Today's Vitals: BP 128/72    Pulse 89    Temp 98.7 F (37.1 C) (Oral)    Ht 5\' 4"  (1.626 m)    Wt 170 lb 6 oz (77.3 kg)    SpO2 95%    BMI 29.24 kg/m  Vitals with BMI 12/23/2021 12/08/2021 09/23/2021  Height 5\' 4"  5\' 4"  5\' 4"   Weight 170 lbs 6 oz 173 lbs 3 oz 174 lbs 6 oz  BMI 29.23 57.01 77.93  Systolic 903 009 233  Diastolic 72 80 82  Pulse 89 72 99     Physical Exam Vitals reviewed.  Constitutional:      General: She is not in acute distress.    Appearance: Normal appearance.  HENT:     Head: Normocephalic and atraumatic.  Neck:     Vascular: No carotid bruit.  Cardiovascular:     Rate and Rhythm: Normal rate and regular rhythm.     Pulses: Normal pulses.     Heart sounds: Normal heart sounds.  Pulmonary:     Effort: Pulmonary effort is normal.     Breath sounds: Examination of the left-lower field reveals rales. Wheezing and rales present.  Skin:    General: Skin  is warm and dry.  Neurological:  General: No focal deficit present.     Mental Status: She is alert and oriented to person, place, and time.  Psychiatric:        Mood and Affect: Mood normal.        Behavior: Behavior normal.        Judgment: Judgment normal.         Assessment and Plan   1. Acute cough   2. Wheezing      Plan: 1.,  2.  Likely bronchitis versus community-acquired pneumonia.  We will get chest x-ray today.  We will treat empirically with levofloxacin (she is allergic to penicillins and doxycycline), did discuss black box warning of tendon rupture and that if she would have any joint pain to stop medication immediately.  Also discussed other common side effects and what to do if these occur.  She was told to call the office by Monday if symptoms persist or worsen.  We will treat her cough with benzonatate capsules twice a day as needed, if this does not work I will prescribe Hycodan cough syrup that she can take.  We will also prescribe albuterol inhaler that she can take for wheezing as needed.   Tests ordered Orders Placed This Encounter  Procedures   DG Chest 2 View      Meds ordered this encounter  Medications   HYDROcodone bit-homatropine (HYCODAN) 5-1.5 MG/5ML syrup    Sig: Take 5 mLs by mouth every 12 (twelve) hours as needed for cough.    Dispense:  120 mL    Refill:  0    Order Specific Question:   Supervising Provider    Answer:   BURNS, Claudina Lick [[id number]]   benzonatate (TESSALON) 100 MG capsule    Sig: Take 1 capsule (100 mg total) by mouth 2 (two) times daily as needed for cough.    Dispense:  20 capsule    Refill:  0    Order Specific Question:   Supervising Provider    Answer:   BURNS, Claudina Lick [[id number]]   albuterol (VENTOLIN HFA) 108 (90 Base) MCG/ACT inhaler    Sig: Inhale 2 puffs into the lungs every 6 (six) hours as needed for wheezing or shortness of breath.    Dispense:  8 g    Refill:  0    Order Specific Question:   Supervising  Provider    Answer:   BURNS, Claudina Lick [[id number]]   levofloxacin (LEVAQUIN) 750 MG tablet    Sig: Take 1 tablet (750 mg total) by mouth daily.    Dispense:  7 tablet    Refill:  0    Order Specific Question:   Supervising Provider    Answer:   Binnie Rail [id number]    Patient to follow-up in if symptoms worsen or persist next week.  Ailene Ards, NP

## 2021-12-24 ENCOUNTER — Telehealth: Payer: Self-pay

## 2021-12-24 NOTE — Telephone Encounter (Signed)
Pt is calling stating that she isn't feeling any better with the recommendations provided by Jeralyn Ruths, NP yesterday 12/23/21. Pt went ahead and scheduled an appt to be on the books next weekend in case she isn't better by then. ? ?Pt wants to know the results to her chest xray. ? ?PT CB [phone]  ?

## 2021-12-24 NOTE — Telephone Encounter (Signed)
Called patient to inform her of Judson Roch recommendation. Patient states she will hold off on taking the prednisone. Patient has an appt next week. Informed patient that if she feels worse or the SOB or wheezing gets worse over the weekend to go to the ED or UC. Patient verbalize understanding, No further questions ?

## 2021-12-25 ENCOUNTER — Other Ambulatory Visit: Payer: Self-pay

## 2021-12-25 ENCOUNTER — Emergency Department (HOSPITAL_BASED_OUTPATIENT_CLINIC_OR_DEPARTMENT_OTHER): Payer: Medicare Other

## 2021-12-25 ENCOUNTER — Encounter (HOSPITAL_BASED_OUTPATIENT_CLINIC_OR_DEPARTMENT_OTHER): Payer: Self-pay | Admitting: Emergency Medicine

## 2021-12-25 ENCOUNTER — Emergency Department (HOSPITAL_BASED_OUTPATIENT_CLINIC_OR_DEPARTMENT_OTHER)
Admission: EM | Admit: 2021-12-25 | Discharge: 2021-12-25 | Disposition: A | Discharge status: Home / Self Care | Payer: Medicare Other | Attending: Emergency Medicine | Admitting: Emergency Medicine

## 2021-12-25 DIAGNOSIS — R059 Cough, unspecified: Secondary | ICD-10-CM | POA: Diagnosis present

## 2021-12-25 DIAGNOSIS — J069 Acute upper respiratory infection, unspecified: Secondary | ICD-10-CM | POA: Diagnosis not present

## 2021-12-25 DIAGNOSIS — R0602 Shortness of breath: Secondary | ICD-10-CM | POA: Diagnosis not present

## 2021-12-25 LAB — COMPREHENSIVE METABOLIC PANEL
ALT: 25 U/L (ref 0–44)
AST: 24 U/L (ref 15–41)
Albumin: 4.2 g/dL (ref 3.5–5.0)
Alkaline Phosphatase: 62 U/L (ref 38–126)
Anion gap: 11 (ref 5–15)
BUN: 7 mg/dL — ABNORMAL LOW (ref 8–23)
CO2: 25 mmol/L (ref 22–32)
Calcium: 8.9 mg/dL (ref 8.9–10.3)
Chloride: 101 mmol/L (ref 98–111)
Creatinine, Ser: 0.75 mg/dL (ref 0.44–1.00)
GFR, Estimated: >60 mL/min (ref >60)
Glucose, Bld: 126 mg/dL — ABNORMAL HIGH (ref 70–99)
Potassium: 3.4 mmol/L — ABNORMAL LOW (ref 3.5–5.1)
Sodium: 137 mmol/L (ref 135–145)
Total Bilirubin: 0.9 mg/dL (ref 0.3–1.2)
Total Protein: 7.4 g/dL (ref 6.5–8.1)

## 2021-12-25 LAB — CBC WITH DIFFERENTIAL/PLATELET
Abs Immature Granulocytes: 0 10*3/uL (ref 0.00–0.07)
Basophils Absolute: 0 10*3/uL (ref 0.0–0.1)
Basophils Relative: 1 %
Eosinophils Absolute: 0.1 10*3/uL (ref 0.0–0.5)
Eosinophils Relative: 1 %
HCT: 40.4 % (ref 36.0–46.0)
Hemoglobin: 14.1 g/dL (ref 12.0–15.0)
Immature Granulocytes: 0 %
Lymphocytes Relative: 28 %
Lymphs Abs: 1.3 10*3/uL (ref 0.7–4.0)
MCH: 30.7 pg (ref 26.0–34.0)
MCHC: 34.9 g/dL (ref 30.0–36.0)
MCV: 88 fL (ref 80.0–100.0)
Monocytes Absolute: 0.3 10*3/uL (ref 0.1–1.0)
Monocytes Relative: 6 %
Neutro Abs: 2.9 10*3/uL (ref 1.7–7.7)
Neutrophils Relative %: 64 %
Platelets: 221 10*3/uL (ref 150–400)
RBC: 4.59 MIL/uL (ref 3.87–5.11)
RDW: 12.7 % (ref 11.5–15.5)
Smear Review: NORMAL
WBC: 4.6 10*3/uL (ref 4.0–10.5)
nRBC: 0 % (ref 0.0–0.2)

## 2021-12-25 MED ORDER — DEXAMETHASONE 4 MG PO TABS
10.0000 mg | ORAL_TABLET | Freq: Once | ORAL | Status: AC
Start: 2021-12-25 — End: 2021-12-25
  Administered 2021-12-25: 10 mg via ORAL
  Filled 2021-12-25: qty 3

## 2021-12-25 MED ORDER — SODIUM CHLORIDE 0.9 % IV BOLUS
1000.0000 mL | Freq: Once | INTRAVENOUS | Status: AC
  Administered 2021-12-25: 1000 mL via INTRAVENOUS

## 2021-12-25 MED ORDER — ONDANSETRON HCL 4 MG/2ML IJ SOLN
4.0000 mg | Freq: Once | INTRAMUSCULAR | Status: AC
  Administered 2021-12-25: 4 mg via INTRAVENOUS
  Filled 2021-12-25: qty 2

## 2021-12-25 MED ORDER — AEROCHAMBER PLUS FLO-VU LARGE MISC
1.0000 | Freq: Once | Status: AC
  Administered 2021-12-25: 1
  Filled 2021-12-25: qty 1

## 2021-12-25 MED ORDER — IPRATROPIUM-ALBUTEROL 0.5-2.5 (3) MG/3ML IN SOLN
3.0000 mL | RESPIRATORY_TRACT | Status: AC
  Administered 2021-12-25: 6 mL via RESPIRATORY_TRACT
  Filled 2021-12-25: qty 6

## 2021-12-25 MED ORDER — ONDANSETRON 4 MG PO TBDP: ORAL_TABLET | ORAL | 0 refills | Status: DC

## 2021-12-25 NOTE — ED Provider Notes (Signed)
[facility] Provider Note   CSN: [id number] Arrival date & time: 12/25/21  2054     History  Chief Complaint  Patient presents with   Cough   Vomiting    Lori [name] is a 71 y.o. female.  71 yo F with a chief complaints of cough shortness of breath fatigue.  She had been seen by her family doctor couple days ago and was started on medication for possible bronchospasm as well as antibiotics and cough medicine.  She tells me she has quite an intolerance to narcotics and developed nausea and vomiting after starting these medications.  She now feels like she is not able to keep anything down and has been reluctant to try any other medications.  She feels like her cough is a little bit better.   Cough     Home Medications Prior to Admission medications   Medication Sig Start Date End Date Taking? Authorizing Provider  ondansetron (ZOFRAN-ODT) 4 MG disintegrating tablet '4mg'$  ODT q4 hours prn nausea/vomit 12/25/21  Yes Deno Etienne, DO  albuterol (VENTOLIN HFA) 108 (90 Base) MCG/ACT inhaler Inhale 2 puffs into the lungs every 6 (six) hours as needed for wheezing or shortness of breath. 12/23/21   Ailene Ards, NP  Ascorbic Acid (VITAMIN C) 500 MG CHEW Chew 1 tablet by mouth daily.    [provider]  b complex vitamins capsule Take 1 capsule by mouth daily. 05/22/19   Binnie Rail, MD  benzonatate (TESSALON) 100 MG capsule Take 1 capsule (100 mg total) by mouth 2 (two) times daily as needed for cough. 12/23/21   Ailene Ards, NP  Calcium Carbonate-Vitamin D 600-400 MG-UNIT tablet Take 1 tablet by mouth daily. 05/22/19   Binnie Rail, MD  Cholecalciferol (VITAMIN D3) 50 MCG (2000 UT) TABS Take 2,000 Units by mouth daily.     [provider]  COD LIVER OIL PO Take 1 capsule by mouth daily.     [provider]  conjugated estrogens (PREMARIN) vaginal cream Place 1 Applicatorful vaginally as needed.    [provider]  Ginkgo  Biloba 60 MG CAPS Take 60 mg by mouth daily.     [provider]  HYDROcodone bit-homatropine (HYCODAN) 5-1.5 MG/5ML syrup Take 5 mLs by mouth every 12 (twelve) hours as needed for cough. 12/23/21   Ailene Ards, NP  levofloxacin (LEVAQUIN) 750 MG tablet Take 1 tablet (750 mg total) by mouth daily. 12/23/21   Ailene Ards, NP  Misc Natural Products [facility] ADVANCED) CAPS Take 1 capsule by mouth daily.     [provider]  Multiple Vitamins-Minerals (ONE-A-DAY WOMENS 50 PLUS PO) Take 1 tablet by mouth.     [provider]  Psyllium (METAMUCIL FIBER PO) Take 8 capsules by mouth. Patient takes up 8 capsules daily    [provider]  pyridOXINE (VITAMIN B-6) 100 MG tablet Take 100 mg by mouth daily.    [provider]  Rhubarb (ESTROVEN COMPLETE PO) Take 1 tablet by mouth daily.     [provider]  rosuvastatin (CRESTOR) 20 MG tablet TAKE 1 TABLET(20 MG) BY MOUTH DAILY 10/12/21   Jerline Pain, MD  Turmeric (QC TUMERIC COMPLEX PO) Take 1 tablet by mouth daily.     [provider]  vitamin B-12 (CYANOCOBALAMIN) 500 MCG tablet Take 500 mcg by mouth daily.    [provider]  Vitamin E 180 MG CAPS Take 180 mg by  mouth daily.     [provider]      Allergies    Codeine, Doxycycline, Penicillins, and Tramadol    Review of Systems   Review of Systems  Respiratory:  Positive for cough.    Physical Exam Updated Vital Signs BP (!) 157/90    Pulse 94    Temp 98 F (36.7 C)    Resp 20    Ht '5\' 4"'$  (1.626 m)    Wt 78 kg    SpO2 99%    BMI 29.52 kg/m  Physical Exam Vitals and nursing note reviewed.  Constitutional:      General: She is not in acute distress.    Appearance: She is well-developed. She is not diaphoretic.  HENT:     Head: Normocephalic and atraumatic.  Eyes:     Pupils: Pupils are equal, round, and reactive to light.  Cardiovascular:     Rate and Rhythm: Normal rate and regular rhythm.     Heart  sounds: No murmur heard.   No friction rub. No gallop.  Pulmonary:     Effort: Pulmonary effort is normal.     Breath sounds: No wheezing or rales.     Comments: Prolonged expiratory efforts with wheezes. Abdominal:     General: There is no distension.     Palpations: Abdomen is soft.     Tenderness: There is no abdominal tenderness.  Musculoskeletal:        General: No tenderness.     Cervical back: Normal range of motion and neck supple.  Skin:    General: Skin is warm and dry.  Neurological:     Mental Status: She is alert and oriented to person, place, and time.  Psychiatric:        Behavior: Behavior normal.    ED Results / Procedures / Treatments   Labs (all labs ordered are listed, but only abnormal results are displayed) Labs Reviewed  COMPREHENSIVE METABOLIC PANEL - Abnormal; Notable for the following components:      Result Value   Potassium 3.4 (*)    Glucose, Bld 126 (*)    BUN 7 (*)    All other components within normal limits  CBC WITH DIFFERENTIAL/PLATELET    EKG EKG Interpretation  Date/Time:  Saturday December 25 2021 21:08:45 EST Ventricular Rate:  86 PR Interval:  198 QRS Duration: 86 QT Interval:  362 QTC Calculation: 433 R Axis:   49 Text Interpretation: Sinus rhythm No old tracing to compare Confirmed by Deno Etienne [phone]) on 12/25/2021 9:23:17 PM  Radiology DG Chest Port 1 View  Result Date: 12/25/2021 CLINICAL DATA:  Shortness of breath. EXAM: PORTABLE CHEST 1 VIEW COMPARISON:  Chest x-ray 12/23/2021 FINDINGS: The heart size and mediastinal contours are within normal limits. Both lungs are clear. The visualized skeletal structures are unremarkable. IMPRESSION: No active disease. Electronically Signed   By: Ronney Asters M.D.   On: 12/25/2021 21:32    Procedures Procedures    Medications Ordered in ED Medications  ipratropium-albuterol (DUONEB) 0.5-2.5 (3) MG/3ML nebulizer solution 3 mL (6 mLs Nebulization Given 12/25/21 2158)  AeroChamber Plus  Flo-Vu Large MISC 1 each (has no administration in time range)  sodium chloride 0.9 % bolus 1,000 mL (0 mLs Intravenous Stopped 12/25/21 2226)  ondansetron (ZOFRAN) injection 4 mg (4 mg Intravenous Given 12/25/21 2123)  dexamethasone (DECADRON) tablet 10 mg (10 mg Oral Given 12/25/21 2152)    ED Course/ Medical Decision Making/ A&P  Medical Decision Making Amount and/or Complexity of Data Reviewed Labs: ordered. Radiology: ordered.  Risk Prescription drug management.   71 yo F with a chief complaints of cough congestion going on for a few days now.  Seen her family doctor and was started on a narcotic cough syrup of Levaquin and developed nausea and vomiting.  Has had difficulty tolerating anything by mouth.  She was given a bolus of IV fluids and antiemetics here and is now tolerating by mouth without issue.  No concerning laboratory finding, no leukocytosis no anemia no significant electrolyte abnormality.  She was given 2 DuoNebs with improvement of wheezes that I heard on exam.  Was given dose of Decadron also with improvement.  Will discharge home.  I cautioned her about possible medication side effects and she may stop her antibiotic and cough medicine.  10:54 PM:  I have discussed the diagnosis/risks/treatment options with the patient.  Evaluation and diagnostic testing in the emergency department does not suggest an emergent condition requiring admission or immediate intervention beyond what has been performed at this time.  They will follow up with  PCP. We also discussed returning to the ED immediately if new or worsening sx occur. We discussed the sx which are most concerning (e.g., sudden worsening pain, fever, inability to tolerate by mouth) that necessitate immediate return. Medications administered to the patient during their visit and any new prescriptions provided to the patient are listed below.  Medications given during this visit Medications   ipratropium-albuterol (DUONEB) 0.5-2.5 (3) MG/3ML nebulizer solution 3 mL (6 mLs Nebulization Given 12/25/21 2158)  AeroChamber Plus Flo-Vu Large MISC 1 each (has no administration in time range)  sodium chloride 0.9 % bolus 1,000 mL (0 mLs Intravenous Stopped 12/25/21 2226)  ondansetron (ZOFRAN) injection 4 mg (4 mg Intravenous Given 12/25/21 2123)  dexamethasone (DECADRON) tablet 10 mg (10 mg Oral Given 12/25/21 2152)     The patient appears reasonably screen and/or stabilized for discharge and I doubt any other medical condition or other [facility] requiring further screening, evaluation, or treatment in the ED at this time prior to discharge.          Final Clinical Impression(s) / ED Diagnoses Final diagnoses:  Acute upper respiratory infection    Rx / DC Orders ED Discharge Orders          Ordered    ondansetron (ZOFRAN-ODT) 4 MG disintegrating tablet        12/25/21 2249              Deno Etienne, DO 12/25/21 2254

## 2021-12-25 NOTE — ED Triage Notes (Signed)
Reports having SOB with wheezing for the last week.  Seen at PCP on Thursday.  Reports using inhaler, antibiotic, and hydromet cough medicine.  Reports n/v since starting the meds.   ?

## 2021-12-25 NOTE — Discharge Instructions (Signed)
Take tylenol 2 pills 4 times a day and motrin 4 pills 3 times a day.  Drink plenty of fluids.  Return for worsening shortness of breath, headache, confusion. Follow up with your family doctor.   

## 2021-12-25 NOTE — ED Notes (Signed)
XR at bedside

## 2021-12-27 DIAGNOSIS — M1712 Unilateral primary osteoarthritis, left knee: Secondary | ICD-10-CM | POA: Diagnosis not present

## 2021-12-27 DIAGNOSIS — M1711 Unilateral primary osteoarthritis, right knee: Secondary | ICD-10-CM | POA: Diagnosis not present

## 2021-12-28 NOTE — Progress Notes (Signed)
? ? ?Subjective:  ? ? Patient ID: Lori [name], female    DOB: [birth year], 71 y.o.   MRN: [id number] ? ?This visit occurred during the SARS-CoV-2 public health emergency.  Safety protocols were in place, including screening questions prior to the visit, additional usage of staff PPE, and extensive cleaning of exam room while observing appropriate contact time as indicated for disinfecting solutions. ? ? ? ?HPI ?Lori [name] is here for  ?Chief Complaint  ?Patient presents with  ? Cough  ?  Still not feeling well.  ? ?3/2 saw [name] cough, fatigue, fever up to 101.5.  covid test at home was neg.  She had wheezing on exam.  She was started on levaquin, tessalon perles, hycodan syrup and albuterol.   Cxr neg ? ?ED 3/4 for cough, vomiting, SOB, fatigue.  She did not tolerate the hycodan and could not keep anything down.  In ED given zofran, dexamethasone, neb treatments.  Symptoms improved.    CXR  3/4 - neg ? ? ?Sunday and Monday she was much better and thought it was related to the steroids.  Yesterday she did feel worse compared to the 2 days prior, but still better than last week.  Overall she is better.  She is still coughing.  Her appetite is still low.  She has not heard any wheezing and denies shortness of breath. ? ?She is using the inhaler.  She is taking robitussin.  ? ? ?Yesterday she started having left-sided neck stiffness that has been bothering her.  She was not sure what to take for that. ? ?Medications and allergies reviewed with patient and updated if appropriate. ? ?Current Outpatient Medications on File Prior to Visit  ?Medication Sig Dispense Refill  ? albuterol (VENTOLIN HFA) 108 (90 Base) MCG/ACT inhaler Inhale 2 puffs into the lungs every 6 (six) hours as needed for wheezing or shortness of breath. 8 g 0  ? Ascorbic Acid (VITAMIN C) 500 MG CHEW Chew 1 tablet by mouth daily.    ? b complex vitamins capsule Take 1 capsule by mouth daily.    ? Calcium Carbonate-Vitamin D 600-400 MG-UNIT tablet Take 1  tablet by mouth daily.    ? Cholecalciferol (VITAMIN D3) 50 MCG (2000 UT) TABS Take 2,000 Units by mouth daily.     ? COD LIVER OIL PO Take 1 capsule by mouth daily.     ? conjugated estrogens (PREMARIN) vaginal cream Place 1 Applicatorful vaginally as needed.    ? Ginkgo Biloba 60 MG CAPS Take 60 mg by mouth daily.     ? levofloxacin (LEVAQUIN) 750 MG tablet Take 1 tablet (750 mg total) by mouth daily. 7 tablet 0  ? Misc Natural Products (TART CHERRY ADVANCED) CAPS Take 1 capsule by mouth daily.     ? Multiple Vitamins-Minerals (ONE-A-DAY WOMENS 50 PLUS PO) Take 1 tablet by mouth.     ? ondansetron (ZOFRAN-ODT) 4 MG disintegrating tablet '4mg'$  ODT q4 hours prn nausea/vomit 20 tablet 0  ? Psyllium (METAMUCIL FIBER PO) Take 8 capsules by mouth. Patient takes up 8 capsules daily    ? pyridOXINE (VITAMIN B-6) 100 MG tablet Take 100 mg by mouth daily.    ? Rhubarb (ESTROVEN COMPLETE PO) Take 1 tablet by mouth daily.     ? rosuvastatin (CRESTOR) 20 MG tablet TAKE 1 TABLET(20 MG) BY MOUTH DAILY 90 tablet 2  ? Turmeric (QC TUMERIC COMPLEX PO) Take 1 tablet by mouth daily.     ? vitamin B-12 (CYANOCOBALAMIN) 500  MCG tablet Take 500 mcg by mouth daily.    ? Vitamin E 180 MG CAPS Take 180 mg by mouth daily.     ? ?No current facility-administered medications on file prior to visit.  ? ? ?Review of Systems  ?Constitutional:  Negative for fever.  ?HENT:  Negative for congestion, sinus pain and sore throat.   ?Respiratory:  Positive for cough. Negative for chest tightness, shortness of breath and wheezing.   ?Gastrointestinal:  Negative for nausea.  ?Musculoskeletal:  Positive for neck stiffness (left side).  ?Neurological:  Negative for light-headedness and headaches.  ? ?   ?Objective:  ? ?Vitals:  ? 12/29/21 1448  ?BP: (!) 146/70  ?Pulse: 90  ?Temp: 98.6 ?F (37 ?C)  ?SpO2: 97%  ? ?BP Readings from Last 3 Encounters:  ?12/29/21 (!) 146/70  ?12/25/21 140/68  ?12/23/21 128/72  ? ?Wt Readings from Last 3 Encounters:  ?12/29/21 168  lb 6.4 oz (76.4 kg)  ?12/25/21 172 lb (78 kg)  ?12/23/21 170 lb 6 oz (77.3 kg)  ? ?Body mass index is 28.91 kg/m?. ? ?  ?Physical Exam ?Constitutional:   ?   General: She is not in acute distress. ?   Appearance: Normal appearance. She is not ill-appearing.  ?HENT:  ?   Head: Normocephalic and atraumatic.  ?   Right Ear: Tympanic membrane, ear canal and external ear normal.  ?   Left Ear: Tympanic membrane, ear canal and external ear normal.  ?   Mouth/Throat:  ?   Mouth: Mucous membranes are moist.  ?   Pharynx: No oropharyngeal exudate or posterior oropharyngeal erythema.  ?Eyes:  ?   Conjunctiva/sclera: Conjunctivae normal.  ?Cardiovascular:  ?   Rate and Rhythm: Normal rate and regular rhythm.  ?Pulmonary:  ?   Effort: Pulmonary effort is normal. No respiratory distress.  ?   Breath sounds: Normal breath sounds. No wheezing or rales.  ?Musculoskeletal:     ?   General: Tenderness (Left lateral and posterior neck and upper back muscles.  Increased pain with movement, especially looking toward the left) present.  ?   Cervical back: Neck supple. No tenderness.  ?Lymphadenopathy:  ?   Cervical: No cervical adenopathy.  ?Skin: ?   General: Skin is warm and dry.  ?Neurological:  ?   Mental Status: She is alert.  ?   Sensory: No sensory deficit.  ?   Motor: No weakness.  ? ?   ? ?DG Chest Port 1 View ?CLINICAL DATA:  Shortness of breath. ? ?EXAM: ?PORTABLE CHEST 1 VIEW ? ?COMPARISON:  Chest x-ray 12/23/2021 ? ?FINDINGS: ?The heart size and mediastinal contours are within normal limits. ?Both lungs are clear. The visualized skeletal structures are ?unremarkable. ? ?IMPRESSION: ?No active disease. ? ?Electronically Signed ?  By: Ronney Asters M.D. ?  On: 12/25/2021 21:32 ? ? ? ? ? ?Assessment & Plan:  ? ? ?See Problem List for Assessment and Plan of chronic medical problems.  ? ? ? ? ?

## 2021-12-29 ENCOUNTER — Encounter: Payer: Self-pay | Admitting: Internal Medicine

## 2021-12-29 ENCOUNTER — Ambulatory Visit (INDEPENDENT_AMBULATORY_CARE_PROVIDER_SITE_OTHER): Payer: Medicare Other | Admitting: Internal Medicine

## 2021-12-29 ENCOUNTER — Other Ambulatory Visit: Payer: Self-pay

## 2021-12-29 DIAGNOSIS — S161XXA Strain of muscle, fascia and tendon at neck level, initial encounter: Secondary | ICD-10-CM | POA: Diagnosis not present

## 2021-12-29 DIAGNOSIS — R030 Elevated blood-pressure reading, without diagnosis of hypertension: Secondary | ICD-10-CM | POA: Diagnosis not present

## 2021-12-29 DIAGNOSIS — J069 Acute upper respiratory infection, unspecified: Secondary | ICD-10-CM | POA: Diagnosis not present

## 2021-12-29 NOTE — Assessment & Plan Note (Signed)
New ?Blood pressure elevated here today, but she is sick so will not start any medication at this time ?She is a routine follow-up/physical in a few months and we will recheck at that time ?

## 2021-12-29 NOTE — Assessment & Plan Note (Signed)
Acute ?Left sided neck strain-stiffness and pain, especially when looking left ?Started yesterday ?Discussed this was muscular in nature ?Can try heat, gentle massage, gentle stretching ?Can take ibuprofen and use Aspercreme ?Can consider muscle relaxer, but she has had recent side effects to other medications and at this point we both agree would be best to avoid unless her symptoms do not improve ?

## 2021-12-29 NOTE — Patient Instructions (Addendum)
? ? ? ? ? ?  Medications changes include :   none ? ? ? ?Continue the inhaler as needed, otc cough syrup.   Try heat, stretching, massage and Aspercreme for your neck ? ? ?Return if symptoms worsen or fail to improve. ? ?

## 2021-12-29 NOTE — Assessment & Plan Note (Signed)
Subacute ?Symptoms that she has been experiencing likely related to viral URI associated with wheezing/reactive airway disease ?Symptoms have improved ?She is still feeling fatigued and has a decreased appetite, but I expect those to slowly improve over the next few days-continuing drinking lots of fluids and advance diet as tolerated ?Continue symptomatic treatment-albuterol inhaler, Robitussin cough syrup and any over-the-counter cold medications needed ?She will call if her symptoms do not continue to improve over the next few days ?

## 2022-03-01 DIAGNOSIS — Z01419 Encounter for gynecological examination (general) (routine) without abnormal findings: Secondary | ICD-10-CM | POA: Diagnosis not present

## 2022-03-01 DIAGNOSIS — N952 Postmenopausal atrophic vaginitis: Secondary | ICD-10-CM | POA: Diagnosis not present

## 2022-03-14 ENCOUNTER — Encounter: Payer: Self-pay | Admitting: Internal Medicine

## 2022-03-14 ENCOUNTER — Ambulatory Visit (INDEPENDENT_AMBULATORY_CARE_PROVIDER_SITE_OTHER): Payer: Medicare Other | Admitting: Internal Medicine

## 2022-03-14 DIAGNOSIS — J309 Allergic rhinitis, unspecified: Secondary | ICD-10-CM

## 2022-03-14 MED ORDER — PREDNISONE 20 MG PO TABS: 40.0000 mg | ORAL_TABLET | Freq: Every day | ORAL | 0 refills | Status: AC

## 2022-03-14 NOTE — Patient Instructions (Addendum)
     Medications changes include :   prednisone 40 mg daily x 5 days - take with breakfast   Your prescription(s) have been sent to your pharmacy.     Return if symptoms worsen or fail to improve.

## 2022-03-14 NOTE — Assessment & Plan Note (Signed)
Acute on chronic Acute flare related to mowing the grass 1 week ago that is not improving with over-the-counter allergy and cold medications Typically does not have symptoms this bad No evidence of infection Agree with probable flare of seasonal allergies Can continue Zyrtec daily Start prednisone 40 mg daily x5 days since her symptoms are resistant to over-the-counter medications

## 2022-03-14 NOTE — Progress Notes (Signed)
Subjective:    Patient ID: Lori [name], female    DOB: 11-Dec-1950, 71 y.o.   MRN: [id number]      HPI Lori [name] is here for  Chief Complaint  Patient presents with   Seasonal allergies     Allergic rhinitis: She feels like recently her seasonal allergies have really flared and she is very symptomatic.  She mowed the grass last Monday.  When she came in after mowing the grass she felt nasal congestion, runny nose.  She typically wears a mask, but did not wear a mask that day.  Her symptoms continued to get worse and are not improving.  Gums started getting sore and she had difficulty eating.  She was gargling with something-Listerine and something else.  She tried tylenol sinus, benadryl and zyrtec.  Nothing seemed to work.    Medications and allergies reviewed with patient and updated if appropriate.  Current Outpatient Medications on File Prior to Visit  Medication Sig Dispense Refill   albuterol (VENTOLIN HFA) 108 (90 Base) MCG/ACT inhaler Inhale 2 puffs into the lungs every 6 (six) hours as needed for wheezing or shortness of breath. 8 g 0   Ascorbic Acid (VITAMIN C) 500 MG CHEW Chew 1 tablet by mouth daily.     b complex vitamins capsule Take 1 capsule by mouth daily.     Calcium Carbonate-Vitamin D 600-400 MG-UNIT tablet Take 1 tablet by mouth daily.     Cholecalciferol (VITAMIN D3) 50 MCG (2000 UT) TABS Take 2,000 Units by mouth daily.      COD LIVER OIL PO Take 1 capsule by mouth daily.      conjugated estrogens (PREMARIN) vaginal cream Place 1 Applicatorful vaginally as needed.     Ginkgo Biloba 60 MG CAPS Take 60 mg by mouth daily.      Misc Natural Products (TART CHERRY ADVANCED) CAPS Take 1 capsule by mouth daily.      Multiple Vitamins-Minerals (ONE-A-DAY WOMENS 50 PLUS PO) Take 1 tablet by mouth.      ondansetron (ZOFRAN-ODT) 4 MG disintegrating tablet '4mg'$  ODT q4 hours prn nausea/vomit 20 tablet 0   Psyllium (METAMUCIL FIBER PO) Take 8 capsules by mouth. Patient  takes up 8 capsules daily     pyridOXINE (VITAMIN B-6) 100 MG tablet Take 100 mg by mouth daily.     Rhubarb (ESTROVEN COMPLETE PO) Take 1 tablet by mouth daily.      rosuvastatin (CRESTOR) 20 MG tablet TAKE 1 TABLET(20 MG) BY MOUTH DAILY 90 tablet 2   Turmeric (QC TUMERIC COMPLEX PO) Take 1 tablet by mouth daily.      vitamin B-12 (CYANOCOBALAMIN) 500 MCG tablet Take 500 mcg by mouth daily.     Vitamin E 180 MG CAPS Take 180 mg by mouth daily.      No current facility-administered medications on file prior to visit.    Review of Systems  Constitutional:  Negative for fever.  HENT:  Positive for congestion and rhinorrhea. Negative for ear pain, sinus pressure, sinus pain and sore throat.   Respiratory:  Positive for cough. Negative for shortness of breath and wheezing.   Neurological:  Negative for dizziness, light-headedness and headaches.      Objective:   Vitals:   03/14/22 1318  BP: 138/82  Pulse: 94  Temp: 98.6 F (37 C)  SpO2: 99%   BP Readings from Last 3 Encounters:  03/14/22 138/82  12/29/21 (!) 146/70  12/25/21 140/68   Wt Readings from Last  3 Encounters:  03/14/22 166 lb 9.6 oz (75.6 kg)  12/29/21 168 lb 6.4 oz (76.4 kg)  12/25/21 172 lb (78 kg)   Body mass index is 28.6 kg/m.    Physical Exam Constitutional:      General: She is not in acute distress.    Appearance: Normal appearance. She is not ill-appearing.  HENT:     Head: Normocephalic and atraumatic.     Right Ear: Tympanic membrane, ear canal and external ear normal.     Left Ear: Tympanic membrane, ear canal and external ear normal.     Mouth/Throat:     Mouth: Mucous membranes are moist.     Pharynx: No oropharyngeal exudate or posterior oropharyngeal erythema.  Eyes:     Conjunctiva/sclera: Conjunctivae normal.  Cardiovascular:     Rate and Rhythm: Normal rate and regular rhythm.  Pulmonary:     Effort: Pulmonary effort is normal. No respiratory distress.     Breath sounds: Normal  breath sounds. No wheezing or rales.  Musculoskeletal:     Cervical back: Neck supple. No tenderness.  Lymphadenopathy:     Cervical: No cervical adenopathy.  Skin:    General: Skin is warm and dry.  Neurological:     Mental Status: She is alert.           Assessment & Plan:    See Problem List for Assessment and Plan of chronic medical problems.

## 2022-05-17 NOTE — Progress Notes (Unsigned)
    Subjective:    Patient ID: LAHELA WOODIN, female    DOB: Oct 10, 1951, 71 y.o.   MRN: [id number]      HPI Luvada is here for No chief complaint on file.    Leg pain - pain in both legs   Knot in right arm -     Medications and allergies reviewed with patient and updated if appropriate.  Current Outpatient Medications on File Prior to Visit  Medication Sig Dispense Refill   albuterol (VENTOLIN HFA) 108 (90 Base) MCG/ACT inhaler Inhale 2 puffs into the lungs every 6 (six) hours as needed for wheezing or shortness of breath. 8 g 0   Ascorbic Acid (VITAMIN C) 500 MG CHEW Chew 1 tablet by mouth daily.     b complex vitamins capsule Take 1 capsule by mouth daily.     Calcium Carbonate-Vitamin D 600-400 MG-UNIT tablet Take 1 tablet by mouth daily.     Cholecalciferol (VITAMIN D3) 50 MCG (2000 UT) TABS Take 2,000 Units by mouth daily.      COD LIVER OIL PO Take 1 capsule by mouth daily.      conjugated estrogens (PREMARIN) vaginal cream Place 1 Applicatorful vaginally as needed.     Ginkgo Biloba 60 MG CAPS Take 60 mg by mouth daily.      Misc Natural Products (TART CHERRY ADVANCED) CAPS Take 1 capsule by mouth daily.      Multiple Vitamins-Minerals (ONE-A-DAY WOMENS 50 PLUS PO) Take 1 tablet by mouth.      ondansetron (ZOFRAN-ODT) 4 MG disintegrating tablet '4mg'$  ODT q4 hours prn nausea/vomit 20 tablet 0   Psyllium (METAMUCIL FIBER PO) Take 8 capsules by mouth. Patient takes up 8 capsules daily     pyridOXINE (VITAMIN B-6) 100 MG tablet Take 100 mg by mouth daily.     Rhubarb (ESTROVEN COMPLETE PO) Take 1 tablet by mouth daily.      rosuvastatin (CRESTOR) 20 MG tablet TAKE 1 TABLET(20 MG) BY MOUTH DAILY 90 tablet 2   Turmeric (QC TUMERIC COMPLEX PO) Take 1 tablet by mouth daily.      vitamin B-12 (CYANOCOBALAMIN) 500 MCG tablet Take 500 mcg by mouth daily.     Vitamin E 180 MG CAPS Take 180 mg by mouth daily.      No current facility-administered medications on file prior to  visit.    Review of Systems     Objective:  There were no vitals filed for this visit. BP Readings from Last 3 Encounters:  03/14/22 138/82  12/29/21 (!) 146/70  12/25/21 140/68   Wt Readings from Last 3 Encounters:  03/14/22 166 lb 9.6 oz (75.6 kg)  12/29/21 168 lb 6.4 oz (76.4 kg)  12/25/21 172 lb (78 kg)   There is no height or weight on file to calculate BMI.    Physical Exam         Assessment & Plan:    See Problem List for Assessment and Plan of chronic medical problems.

## 2022-05-18 ENCOUNTER — Ambulatory Visit (INDEPENDENT_AMBULATORY_CARE_PROVIDER_SITE_OTHER): Payer: Medicare Other | Admitting: Internal Medicine

## 2022-05-18 ENCOUNTER — Encounter: Payer: Self-pay | Admitting: Internal Medicine

## 2022-05-18 DIAGNOSIS — M1712 Unilateral primary osteoarthritis, left knee: Secondary | ICD-10-CM | POA: Insufficient documentation

## 2022-05-18 DIAGNOSIS — R030 Elevated blood-pressure reading, without diagnosis of hypertension: Secondary | ICD-10-CM | POA: Diagnosis not present

## 2022-05-18 DIAGNOSIS — R200 Anesthesia of skin: Secondary | ICD-10-CM | POA: Diagnosis not present

## 2022-05-18 NOTE — Assessment & Plan Note (Signed)
Chronic Blood pressure initially elevated, better on repeat Will repeat in a couple weeks at her physical

## 2022-05-18 NOTE — Assessment & Plan Note (Signed)
Chronic Left knee pain and swelling secondary to left knee arthritis Has seen sports medicine and orthopedics Injections have helped She is still walking Has been told by orthopedics that the neck step if the injections stop working is a total knee replacement which she is not ready to have Discussed is not a lot of options-she can ice the knee, take over-the-counter pain medications as needed, continue to walk as tolerated and do the intermittent injections

## 2022-05-18 NOTE — Patient Instructions (Signed)
     See you in a couple of weeks for your physical

## 2022-05-18 NOTE — Assessment & Plan Note (Signed)
Chronic, intermittent Intermittent left arm numbness often related to sleeping or having the arm in a certain position She has brought this up over 2 years ago so I think this is a chronic issue Occasional tingling in her hands No neck pain ?  Positional, cervical radiculopathy, carpal tunnel or other At this point symptoms are mild and intermittent so we will continue to monitor Can follow-up at her physical coming up

## 2022-05-29 NOTE — Progress Notes (Signed)
Subjective:    Patient ID: Lori [name], female    DOB: 03/03/1951, 71 y.o.   MRN: [id number]      HPI Lori [name] is here for a Physical exam.    Still with knee arthritis - will have injection next week.  Otherwise doing good    Medications and allergies reviewed with patient and updated if appropriate.  Current Outpatient Medications on File Prior to Visit  Medication Sig Dispense Refill   albuterol (VENTOLIN HFA) 108 (90 Base) MCG/ACT inhaler Inhale 2 puffs into the lungs every 6 (six) hours as needed for wheezing or shortness of breath. 8 g 0   Ascorbic Acid (VITAMIN C) 500 MG CHEW Chew 1 tablet by mouth daily.     b complex vitamins capsule Take 1 capsule by mouth daily.     Calcium Carbonate-Vitamin D 600-400 MG-UNIT tablet Take 1 tablet by mouth daily.     Cholecalciferol (VITAMIN D3) 50 MCG (2000 UT) TABS Take 2,000 Units by mouth daily.      COD LIVER OIL PO Take 1 capsule by mouth daily.      conjugated estrogens (PREMARIN) vaginal cream Place 1 Applicatorful vaginally as needed.     Ginkgo Biloba 60 MG CAPS Take 60 mg by mouth daily.      Misc Natural Products (TART CHERRY ADVANCED) CAPS Take 1 capsule by mouth daily.      Multiple Vitamins-Minerals (ONE-A-DAY WOMENS 50 PLUS PO) Take 1 tablet by mouth.      Psyllium (METAMUCIL FIBER PO) Take 8 capsules by mouth. Patient takes up 8 capsules daily     pyridOXINE (VITAMIN B-6) 100 MG tablet Take 100 mg by mouth daily.     Rhubarb (ESTROVEN COMPLETE PO) Take 1 tablet by mouth daily.      rosuvastatin (CRESTOR) 20 MG tablet TAKE 1 TABLET(20 MG) BY MOUTH DAILY 90 tablet 2   Turmeric (QC TUMERIC COMPLEX PO) Take 1 tablet by mouth daily.      vitamin B-12 (CYANOCOBALAMIN) 500 MCG tablet Take 500 mcg by mouth daily.     Vitamin E 180 MG CAPS Take 180 mg by mouth daily.      No current facility-administered medications on file prior to visit.    Review of Systems  Constitutional:  Negative for fever.  Eyes:  Negative for  visual disturbance.  Respiratory:  Negative for cough, shortness of breath and wheezing.   Cardiovascular:  Negative for chest pain, palpitations and leg swelling.  Gastrointestinal:  Negative for abdominal pain, blood in stool, constipation, diarrhea and nausea.       No gerd  Genitourinary:  Negative for dysuria.  Musculoskeletal:  Positive for arthralgias. Negative for back pain.  Skin:  Negative for rash.  Neurological:  Negative for light-headedness and headaches.  Psychiatric/Behavioral:  Negative for dysphoric mood. The patient is not nervous/anxious.        Objective:   Vitals:   05/31/22 0817  BP: 130/70  Pulse: 70  Temp: 98 F (36.7 C)  SpO2: 98%   Filed Weights   05/31/22 0817  Weight: 173 lb (78.5 kg)   Body mass index is 29.7 kg/m.  BP Readings from Last 3 Encounters:  05/31/22 130/70  05/18/22 140/78  03/14/22 138/82    Wt Readings from Last 3 Encounters:  05/31/22 173 lb (78.5 kg)  05/18/22 174 lb (78.9 kg)  03/14/22 166 lb 9.6 oz (75.6 kg)       Physical Exam Constitutional: She appears well-developed  and well-nourished. No distress.  HENT:  Head: Normocephalic and atraumatic.  Right Ear: External ear normal. Normal ear canal and TM Left Ear: External ear normal.  Normal ear canal and TM Mouth/Throat: Oropharynx is clear and moist.  Eyes: Conjunctivae normal.  Neck: Neck supple. No tracheal deviation present. No thyromegaly present.  No carotid bruit  Cardiovascular: Normal rate, regular rhythm and normal heart sounds.   No murmur heard.  No edema. Pulmonary/Chest: Effort normal and breath sounds normal. No respiratory distress. She has no wheezes. She has no rales.  Breast: deferred   Abdominal: Soft. She exhibits no distension. There is no tenderness.  Lymphadenopathy: She has no cervical adenopathy.  Skin: Skin is warm and dry. She is not diaphoretic.  Psychiatric: She has a normal mood and affect. Her behavior is normal.     Lab  Results  Component Value Date   WBC 4.6 12/25/2021   HGB 14.1 12/25/2021   HCT 40.4 12/25/2021   PLT 221 12/25/2021   GLUCOSE 126 (H) 12/25/2021   CHOL 200 05/25/2021   TRIG 80.0 05/25/2021   HDL 81.30 05/25/2021   LDLCALC 103 (H) 05/25/2021   ALT 25 12/25/2021   AST 24 12/25/2021   NA 137 12/25/2021   K 3.4 (L) 12/25/2021   CL 101 12/25/2021   CREATININE 0.75 12/25/2021   BUN 7 (L) 12/25/2021   CO2 25 12/25/2021   TSH 1.45 05/25/2021   HGBA1C 5.5 05/25/2021         Assessment & Plan:   Physical exam: Screening blood work  ordered Exercise walking-a little limited by her knee arthritis [name] for age Substance abuse  none   Reviewed recommended immunizations.  Prevnar today.    Health Maintenance  Topic Date Due   Zoster Vaccines- Shingrix (1 of 2) Never done   Pneumonia Vaccine 66+ Years old (1 - PCV) Never done   COVID-19 Vaccine (3 - Moderna risk series) 06/16/2022 (Originally 02/18/2020)   INFLUENZA VACCINE  01/22/2023 (Originally 05/24/2022)   DEXA SCAN  06/30/2023   MAMMOGRAM  10/14/2023   COLONOSCOPY (Pts 45-62yr Insurance coverage will need to be confirmed)  10/01/2024   TETANUS/TDAP  05/08/2028   Hepatitis C Screening  Completed   HPV VACCINES  Aged Out          See Problem List for Assessment and Plan of chronic medical problems.

## 2022-05-29 NOTE — Patient Instructions (Addendum)
Prevnar 20 vaccine given today ( for pneumonia)   Blood work was ordered.     Medications changes include :   None    Return in about 1 year (around 06/01/2023) for Physical Exam.   Health Maintenance, Female Adopting a healthy lifestyle and getting preventive care are important in promoting health and wellness. Ask your health care provider about: The right schedule for you to have regular tests and exams. Things you can do on your own to prevent diseases and keep yourself healthy. What should I know about diet, weight, and exercise? Eat a healthy diet  Eat a diet that includes plenty of vegetables, fruits, low-fat dairy products, and lean protein. Do not eat a lot of foods that are high in solid fats, added sugars, or sodium. Maintain a healthy weight Body mass index (BMI) is used to identify weight problems. It estimates body fat based on height and weight. Your health care provider can help determine your BMI and help you achieve or maintain a healthy weight. Get regular exercise Get regular exercise. This is one of the most important things you can do for your health. Most adults should: Exercise for at least 150 minutes each week. The exercise should increase your heart rate and make you sweat (moderate-intensity exercise). Do strengthening exercises at least twice a week. This is in addition to the moderate-intensity exercise. Spend less time sitting. Even light physical activity can be beneficial. Watch cholesterol and blood lipids Have your blood tested for lipids and cholesterol at 69 years of age, then have this test every 5 years. Have your cholesterol levels checked more often if: Your lipid or cholesterol levels are high. You are older than 69 years of age. You are at high risk for heart disease. What should I know about cancer screening? Depending on your health history and family history, you may need to have cancer screening at various ages. This may include  screening for: Breast cancer. Cervical cancer. Colorectal cancer. Skin cancer. Lung cancer. What should I know about heart disease, diabetes, and high blood pressure? Blood pressure and heart disease High blood pressure causes heart disease and increases the risk of stroke. This is more likely to develop in people who have high blood pressure readings or are overweight. Have your blood pressure checked: Every 3-5 years if you are 26-78 years of age. Every year if you are 29 years old or older. Diabetes Have regular diabetes screenings. This checks your fasting blood sugar level. Have the screening done: Once every three years after age 33 if you are at a normal weight and have a low risk for diabetes. More often and at a younger age if you are overweight or have a high risk for diabetes. What should I know about preventing infection? Hepatitis B If you have a higher risk for hepatitis B, you should be screened for this virus. Talk with your health care provider to find out if you are at risk for hepatitis B infection. Hepatitis C Testing is recommended for: Everyone born from [birth year] through 1963. Anyone with known risk factors for hepatitis C. Sexually transmitted infections (STIs) Get screened for STIs, including gonorrhea and chlamydia, if: You are sexually active and are younger than 69 years of age. You are older than 69 years of age and your health care provider tells you that you are at risk for this type of infection. Your sexual activity has changed since you were last screened, and you are at increased  risk for chlamydia or gonorrhea. Ask your health care provider if you are at risk. Ask your health care provider about whether you are at high risk for HIV. Your health care provider may recommend a prescription medicine to help prevent HIV infection. If you choose to take medicine to prevent HIV, you should first get tested for HIV. You should then be tested every 3 months for as  long as you are taking the medicine. Pregnancy If you are about to stop having your period (premenopausal) and you may become pregnant, seek counseling before you get pregnant. Take 400 to 800 micrograms (mcg) of folic acid every day if you become pregnant. Ask for birth control (contraception) if you want to prevent pregnancy. Osteoporosis and menopause Osteoporosis is a disease in which the bones lose minerals and strength with aging. This can result in bone fractures. If you are 64 years old or older, or if you are at risk for osteoporosis and fractures, ask your health care provider if you should: Be screened for bone loss. Take a calcium or vitamin D supplement to lower your risk of fractures. Be given hormone replacement therapy (HRT) to treat symptoms of menopause. Follow these instructions at home: Alcohol use Do not drink alcohol if: Your health care provider tells you not to drink. You are pregnant, may be pregnant, or are planning to become pregnant. If you drink alcohol: Limit how much you have to: 0-1 drink a day. Know how much alcohol is in your drink. In the [place] one drink equals one 12 oz bottle of beer (355 mL), one 5 oz glass of wine (148 mL), or one 1 oz glass of hard liquor (44 mL). Lifestyle Do not use any products that contain nicotine or tobacco. These products include cigarettes, chewing tobacco, and vaping devices, such as e-cigarettes. If you need help quitting, ask your health care provider. Do not use street drugs. Do not share needles. Ask your health care provider for help if you need support or information about quitting drugs. General instructions Schedule regular health, dental, and eye exams. Stay current with your vaccines. Tell your health care provider if: You often feel depressed. You have ever been abused or do not feel safe at home. Summary Adopting a healthy lifestyle and getting preventive care are important in promoting health and  wellness. Follow your health care provider's instructions about healthy diet, exercising, and getting tested or screened for diseases. Follow your health care provider's instructions on monitoring your cholesterol and blood pressure. This information is not intended to replace advice given to you by your health care provider. Make sure you discuss any questions you have with your health care provider. Document Revised: 03/01/2021 Document Reviewed: 03/01/2021 Elsevier Patient Education  [place].

## 2022-05-31 ENCOUNTER — Telehealth: Payer: Self-pay

## 2022-05-31 ENCOUNTER — Encounter: Payer: Self-pay | Admitting: Internal Medicine

## 2022-05-31 ENCOUNTER — Ambulatory Visit (INDEPENDENT_AMBULATORY_CARE_PROVIDER_SITE_OTHER): Payer: Medicare Other | Admitting: Internal Medicine

## 2022-05-31 VITALS — BP 130/70 | HR 70 | Temp 98.0°F | Ht 64.0 in | Wt 173.0 lb

## 2022-05-31 DIAGNOSIS — R739 Hyperglycemia, unspecified: Secondary | ICD-10-CM | POA: Diagnosis not present

## 2022-05-31 DIAGNOSIS — D1721 Benign lipomatous neoplasm of skin and subcutaneous tissue of right arm: Secondary | ICD-10-CM

## 2022-05-31 DIAGNOSIS — Z0000 Encounter for general adult medical examination without abnormal findings: Secondary | ICD-10-CM | POA: Diagnosis not present

## 2022-05-31 DIAGNOSIS — Z23 Encounter for immunization: Secondary | ICD-10-CM

## 2022-05-31 DIAGNOSIS — M1712 Unilateral primary osteoarthritis, left knee: Secondary | ICD-10-CM

## 2022-05-31 DIAGNOSIS — E782 Mixed hyperlipidemia: Secondary | ICD-10-CM

## 2022-05-31 DIAGNOSIS — M8589 Other specified disorders of bone density and structure, multiple sites: Secondary | ICD-10-CM

## 2022-05-31 DIAGNOSIS — I700 Atherosclerosis of aorta: Secondary | ICD-10-CM

## 2022-05-31 LAB — COMPREHENSIVE METABOLIC PANEL
ALT: 18 U/L (ref 0–35)
AST: 24 U/L (ref 0–37)
Albumin: 4.7 g/dL (ref 3.5–5.2)
Alkaline Phosphatase: 66 U/L (ref 39–117)
BUN: 12 mg/dL (ref 6–23)
CO2: 31 mEq/L (ref 19–32)
Calcium: 9.6 mg/dL (ref 8.4–10.5)
Chloride: 100 mEq/L (ref 96–112)
Creatinine, Ser: 0.87 mg/dL (ref 0.40–1.20)
GFR: 68.12 mL/min (ref >60.00)
Glucose, Bld: 106 mg/dL — ABNORMAL HIGH (ref 70–99)
Potassium: 4.3 mEq/L (ref 3.5–5.1)
Sodium: 141 mEq/L (ref 135–145)
Total Bilirubin: 1 mg/dL (ref 0.2–1.2)
Total Protein: 7.3 g/dL (ref 6.0–8.3)

## 2022-05-31 LAB — CBC WITH DIFFERENTIAL/PLATELET
Basophils Absolute: 0 10*3/uL (ref 0.0–0.1)
Basophils Relative: 1.4 % (ref 0.0–3.0)
Eosinophils Absolute: 0.1 10*3/uL (ref 0.0–0.7)
Eosinophils Relative: 2.8 % (ref 0.0–5.0)
HCT: 41 % (ref 36.0–46.0)
Hemoglobin: 14.2 g/dL (ref 12.0–15.0)
Lymphocytes Relative: 45.1 % (ref 12.0–46.0)
Lymphs Abs: 1.4 10*3/uL (ref 0.7–4.0)
MCHC: 34.7 g/dL (ref 30.0–36.0)
MCV: 90.6 fl (ref 78.0–100.0)
Monocytes Absolute: 0.3 10*3/uL (ref 0.1–1.0)
Monocytes Relative: 10.4 % (ref 3.0–12.0)
Neutro Abs: 1.2 10*3/uL — ABNORMAL LOW (ref 1.4–7.7)
Neutrophils Relative %: 40.3 % — ABNORMAL LOW (ref 43.0–77.0)
Platelets: 251 10*3/uL (ref 150.0–400.0)
RBC: 4.53 Mil/uL (ref 3.87–5.11)
RDW: 13.3 % (ref 11.5–15.5)
WBC: 3.1 10*3/uL — ABNORMAL LOW (ref 4.0–10.5)

## 2022-05-31 LAB — LIPID PANEL
Cholesterol: 174 mg/dL (ref 0–200)
HDL: 80.8 mg/dL (ref >39.00)
LDL Cholesterol: 83 mg/dL (ref 0–99)
NonHDL: 93.09
Total CHOL/HDL Ratio: 2
Triglycerides: 51 mg/dL (ref 0.0–149.0)
VLDL: 10.2 mg/dL (ref 0.0–40.0)

## 2022-05-31 LAB — HEMOGLOBIN A1C: Hgb A1c MFr Bld: 5.6 % (ref 4.6–6.5)

## 2022-05-31 LAB — VITAMIN D 25 HYDROXY (VIT D DEFICIENCY, FRACTURES): VITD: 29.54 ng/mL — ABNORMAL LOW (ref 30.00–100.00)

## 2022-05-31 NOTE — Assessment & Plan Note (Signed)
Chronic Following with orthopedics Injections have helped some Still trying to walk and be active, but limited Next step is total knee replacement, which she is not ready to have at this time

## 2022-05-31 NOTE — Telephone Encounter (Signed)
Referral ordered for surgery.

## 2022-05-31 NOTE — Assessment & Plan Note (Signed)
Chronic Continue crestor 20 mg daily 

## 2022-05-31 NOTE — Addendum Note (Signed)
Addended by: Marcina Millard on: 05/31/2022 01:29 PM   Modules accepted: Orders

## 2022-05-31 NOTE — Assessment & Plan Note (Signed)
Chronic Check a1c Low sugar / carb diet Stressed regular exercise  

## 2022-05-31 NOTE — Assessment & Plan Note (Signed)
Chronic Regular exercise and healthy diet encouraged Check lipid panel, CMP Continue Crestor 20 mg daily 

## 2022-05-31 NOTE — Assessment & Plan Note (Signed)
Chronic DEXA up-to-date-due next in 2024 She is exercising regularly-a little limited by her knee osteoarthritis Continue calcium and vitamin D daily Check vitamin D level

## 2022-06-07 DIAGNOSIS — M25562 Pain in left knee: Secondary | ICD-10-CM | POA: Diagnosis not present

## 2022-06-07 DIAGNOSIS — M1712 Unilateral primary osteoarthritis, left knee: Secondary | ICD-10-CM | POA: Diagnosis not present

## 2022-09-19 ENCOUNTER — Encounter: Payer: Self-pay | Admitting: Internal Medicine

## 2022-09-19 ENCOUNTER — Telehealth: Payer: Self-pay | Admitting: Cardiology

## 2022-09-19 NOTE — Telephone Encounter (Signed)
Spoke with pt who is reporting last night when laying down she heard a ticking or flapping sound in her chest.  She could only hear it if in a reclined position.  She denies any other s/s - no SOB, CP or pressure.  She has not taken her vital signs but does not feel as though her HR is elevated.  She is scheduled to see her PCP tomorrow.  Advised pt to keep that appt for further evaluation.  Advised should s/s develop/change to please contact the office.  Pt states understanding.

## 2022-09-19 NOTE — Telephone Encounter (Signed)
Patient c/o Palpitations:  High priority if patient c/o lightheadedness, shortness of breath, or chest pain  How long have you had palpitations/irregular HR/ Afib? Are you having the symptoms now?   No  Are you currently experiencing lightheadedness, SOB or CP?   No  Do you have a history of afib (atrial fibrillation) or irregular heart rhythm?  No  Have you checked your BP or HR? (document readings if available):   No  Are you experiencing any other symptoms? No   Patient stated she feels like a ticking really loud in her heart area only when she lays down at night.

## 2022-09-19 NOTE — Progress Notes (Unsigned)
    Subjective:    Patient ID: Lori [name], female    DOB: 04/28/1951, 71 y.o.   MRN: [id number]      HPI Lori [name] is here for No chief complaint on file.   Arms going numb at night  --     Medications and allergies reviewed with patient and updated if appropriate.  Current Outpatient Medications on File Prior to Visit  Medication Sig Dispense Refill   albuterol (VENTOLIN HFA) 108 (90 Base) MCG/ACT inhaler Inhale 2 puffs into the lungs every 6 (six) hours as needed for wheezing or shortness of breath. 8 g 0   Ascorbic Acid (VITAMIN C) 500 MG CHEW Chew 1 tablet by mouth daily.     b complex vitamins capsule Take 1 capsule by mouth daily.     Calcium Carbonate-Vitamin D 600-400 MG-UNIT tablet Take 1 tablet by mouth daily.     Cholecalciferol (VITAMIN D3) 50 MCG (2000 UT) TABS Take 2,000 Units by mouth daily.      COD LIVER OIL PO Take 1 capsule by mouth daily.      conjugated estrogens (PREMARIN) vaginal cream Place 1 Applicatorful vaginally as needed.     Ginkgo Biloba 60 MG CAPS Take 60 mg by mouth daily.      Misc Natural Products (TART CHERRY ADVANCED) CAPS Take 1 capsule by mouth daily.      Multiple Vitamins-Minerals (ONE-A-DAY WOMENS 50 PLUS PO) Take 1 tablet by mouth.      Psyllium (METAMUCIL FIBER PO) Take 8 capsules by mouth. Patient takes up 8 capsules daily     pyridOXINE (VITAMIN B-6) 100 MG tablet Take 100 mg by mouth daily.     Rhubarb (ESTROVEN COMPLETE PO) Take 1 tablet by mouth daily.      rosuvastatin (CRESTOR) 20 MG tablet TAKE 1 TABLET(20 MG) BY MOUTH DAILY 90 tablet 2   Turmeric (QC TUMERIC COMPLEX PO) Take 1 tablet by mouth daily.      vitamin B-12 (CYANOCOBALAMIN) 500 MCG tablet Take 500 mcg by mouth daily.     Vitamin E 180 MG CAPS Take 180 mg by mouth daily.      No current facility-administered medications on file prior to visit.    Review of Systems     Objective:  There were no vitals filed for this visit. BP Readings from Last 3 Encounters:   05/31/22 130/70  05/18/22 140/78  03/14/22 138/82   Wt Readings from Last 3 Encounters:  05/31/22 173 lb (78.5 kg)  05/18/22 174 lb (78.9 kg)  03/14/22 166 lb 9.6 oz (75.6 kg)   There is no height or weight on file to calculate BMI.    Physical Exam         Assessment & Plan:    See Problem List for Assessment and Plan of chronic medical problems.

## 2022-09-20 ENCOUNTER — Ambulatory Visit (INDEPENDENT_AMBULATORY_CARE_PROVIDER_SITE_OTHER): Payer: Medicare Other | Admitting: Internal Medicine

## 2022-09-20 VITALS — BP 138/68 | HR 88 | Temp 98.0°F | Ht 64.0 in | Wt 172.0 lb

## 2022-09-20 DIAGNOSIS — R202 Paresthesia of skin: Secondary | ICD-10-CM | POA: Diagnosis not present

## 2022-09-20 DIAGNOSIS — R200 Anesthesia of skin: Secondary | ICD-10-CM

## 2022-09-20 NOTE — Assessment & Plan Note (Signed)
Acute Ongoing for 2 weeks-bilateral arm-mostly from the elbow down numbness, tingling and pain-only occurs at night while sleeping-starts an hour or so after she falls asleep.  Can wake up shake her arms out in distraction and goes away, but will require No neck pain, wrist pain Has had this at least once in the past I think more like at least twice Possible bilateral carpal tunnel syndrome versus cervical radiculopathy She has been doing a lot of yard work and thinks that is what has caused it now and that is ending Will hold off on further evaluation Will try bilateral wrist braces to see if that helps Will also see if it goes away as she is not doing the intense physical exercise in the yard She will let me know if her symptoms do not improve/resolve

## 2022-09-20 NOTE — Patient Instructions (Addendum)
     Try bilateral wrist braces at night.   If your numbness/pain does not improve let me know.     Medications changes include :   none     Return if symptoms worsen or fail to improve.

## 2022-10-27 DIAGNOSIS — Z1231 Encounter for screening mammogram for malignant neoplasm of breast: Secondary | ICD-10-CM | POA: Diagnosis not present

## 2022-10-27 LAB — HM MAMMOGRAPHY

## 2022-11-10 ENCOUNTER — Encounter: Payer: Self-pay | Admitting: Internal Medicine

## 2022-11-10 NOTE — Progress Notes (Signed)
Outside notes received. Information abstracted. Notes sent to scan. 

## 2022-12-05 ENCOUNTER — Encounter: Payer: Self-pay | Admitting: Internal Medicine

## 2022-12-05 NOTE — Progress Notes (Unsigned)
    Subjective:    Patient ID: Lori [name], female    DOB: 1950/12/09, 72 y.o.   MRN: [id number]      HPI Chloe is here for No chief complaint on file.    Aches/pain/swelling -     Medications and allergies reviewed with patient and updated if appropriate.  Current Outpatient Medications on File Prior to Visit  Medication Sig Dispense Refill   Ascorbic Acid (VITAMIN C) 500 MG CHEW Chew 1 tablet by mouth daily.     b complex vitamins capsule Take 1 capsule by mouth daily.     Calcium Carbonate-Vitamin D 600-400 MG-UNIT tablet Take 1 tablet by mouth daily.     Cholecalciferol (VITAMIN D3) 50 MCG (2000 UT) TABS Take 2,000 Units by mouth daily.      COD LIVER OIL PO Take 1 capsule by mouth daily.      conjugated estrogens (PREMARIN) vaginal cream Place 1 Applicatorful vaginally as needed.     Ginkgo Biloba 60 MG CAPS Take 60 mg by mouth daily.      Misc Natural Products (TART CHERRY ADVANCED) CAPS Take 1 capsule by mouth daily.      Multiple Vitamins-Minerals (ONE-A-DAY WOMENS 50 PLUS PO) Take 1 tablet by mouth.      pyridOXINE (VITAMIN B-6) 100 MG tablet Take 100 mg by mouth daily.     Rhubarb (ESTROVEN COMPLETE PO) Take 1 tablet by mouth daily.      rosuvastatin (CRESTOR) 20 MG tablet TAKE 1 TABLET(20 MG) BY MOUTH DAILY 90 tablet 2   Turmeric (QC TUMERIC COMPLEX PO) Take 1 tablet by mouth daily.      vitamin B-12 (CYANOCOBALAMIN) 500 MCG tablet Take 500 mcg by mouth daily.     No current facility-administered medications on file prior to visit.    Review of Systems     Objective:  There were no vitals filed for this visit. BP Readings from Last 3 Encounters:  09/20/22 138/68  05/31/22 130/70  05/18/22 140/78   Wt Readings from Last 3 Encounters:  09/20/22 172 lb (78 kg)  05/31/22 173 lb (78.5 kg)  05/18/22 174 lb (78.9 kg)   There is no height or weight on file to calculate BMI.    Physical Exam         Assessment & Plan:    See Problem List for  Assessment and Plan of chronic medical problems.

## 2022-12-06 ENCOUNTER — Ambulatory Visit (INDEPENDENT_AMBULATORY_CARE_PROVIDER_SITE_OTHER): Payer: Medicare Other | Admitting: Internal Medicine

## 2022-12-06 VITALS — BP 120/76 | HR 90 | Temp 98.6°F | Ht 64.0 in | Wt 171.0 lb

## 2022-12-06 DIAGNOSIS — I700 Atherosclerosis of aorta: Secondary | ICD-10-CM

## 2022-12-06 DIAGNOSIS — M1712 Unilateral primary osteoarthritis, left knee: Secondary | ICD-10-CM

## 2022-12-06 DIAGNOSIS — E782 Mixed hyperlipidemia: Secondary | ICD-10-CM | POA: Diagnosis not present

## 2022-12-06 DIAGNOSIS — M7989 Other specified soft tissue disorders: Secondary | ICD-10-CM | POA: Diagnosis not present

## 2022-12-06 MED ORDER — ROSUVASTATIN CALCIUM 20 MG PO TABS: ORAL_TABLET | ORAL | 3 refills | Status: DC

## 2022-12-06 NOTE — Assessment & Plan Note (Signed)
Chronic Continue Crestor 20 mg daily She no longer is seeing cardiology and I will prescribe the above medication Discussed that she needs to continue that long-term

## 2022-12-06 NOTE — Patient Instructions (Signed)
      Elevate your legs when sitting.  Wear compression socks. Ice the knee. Continue tylenol and voltaren gel.    Medications changes include :  none

## 2022-12-06 NOTE — Assessment & Plan Note (Signed)
Chronic She is following with orthopedics She gets intermittent injections as needed Eventually she will need to have a knee replacement but she is not ready to do this Advised icing the knee, continue Tylenol Extra Strength and Voltaren gel Knee pain is likely what is causing some of the lower leg and at times upper leg pain Continue regular exercise as tolerated Monitor symptoms

## 2022-12-06 NOTE — Assessment & Plan Note (Signed)
Chronic Continue Crestor 20 mg daily She is no longer seeing cardiology so I will prescribe the Crestor-sent to pharmacy

## 2022-12-06 NOTE — Assessment & Plan Note (Signed)
New She had 1 episode of bilateral leg swelling-right leg <left leg This occurred when she was sitting all day at the hospital Has not had a recurrent episode Discussed this likely related to renal day, gravity in left leg was likely worsening right leg because of her severe left knee osteoarthritis Discussed avoiding sitting long periods if possible, standing long periods and to use compression socks when able for the circumstances No swelling on exam No further evaluation or treatment needed

## 2023-03-02 DIAGNOSIS — N952 Postmenopausal atrophic vaginitis: Secondary | ICD-10-CM | POA: Diagnosis not present

## 2023-03-02 DIAGNOSIS — Z780 Asymptomatic menopausal state: Secondary | ICD-10-CM | POA: Diagnosis not present

## 2023-03-02 DIAGNOSIS — Z01419 Encounter for gynecological examination (general) (routine) without abnormal findings: Secondary | ICD-10-CM | POA: Diagnosis not present

## 2023-04-12 ENCOUNTER — Encounter: Payer: Self-pay | Admitting: Internal Medicine

## 2023-04-12 DIAGNOSIS — E559 Vitamin D deficiency, unspecified: Secondary | ICD-10-CM | POA: Insufficient documentation

## 2023-04-12 NOTE — Patient Instructions (Addendum)
      Medications changes include :   alprazolam as needed

## 2023-04-12 NOTE — Progress Notes (Unsigned)
Subjective:    Patient ID: Lori [name], female    DOB: 1951-08-13, 72 y.o.   MRN: [id number]     HPI Lori [name] is here for follow up of her chronic medical problems.  She is having a lot of stress recently.  Her daughter is going through cancer treatments and she has been supporting her.  Her sister is in the hospital which she has been helping her and her other siblings.  She just moved and has not had time to get settled.  Her knee has severe osteoarthritis and needs to get it replaced at some point, but at this time does not have the time to do that.  She finds herself overwhelmed and anxious.  At 1 point when her daughter for started chemo treatment I had prescribed her some alprazolam and she uses them on a rare occasion-she has 1 pill left and the pills have been very helpful and she wondered if she could get a refill for additional pills.  She would rather not take anything, but thinks she needs something  Medications and allergies reviewed with patient and updated if appropriate.  Current Outpatient Medications on File Prior to Visit  Medication Sig Dispense Refill   Ascorbic Acid (VITAMIN C) 500 MG CHEW Chew 1 tablet by mouth daily.     b complex vitamins capsule Take 1 capsule by mouth daily.     Calcium Carbonate-Vitamin D 600-400 MG-UNIT tablet Take 1 tablet by mouth daily.     Cholecalciferol (VITAMIN D3) 50 MCG (2000 UT) TABS Take 2,000 Units by mouth daily.      COD LIVER OIL PO Take 1 capsule by mouth daily.      conjugated estrogens (PREMARIN) vaginal cream Place 1 Applicatorful vaginally as needed.     Ginkgo Biloba 60 MG CAPS Take 60 mg by mouth daily.      Misc Natural Products (TART CHERRY ADVANCED) CAPS Take 1 capsule by mouth daily.      Multiple Vitamins-Minerals (ONE-A-DAY WOMENS 50 PLUS PO) Take 1 tablet by mouth.      pyridOXINE (VITAMIN B-6) 100 MG tablet Take 100 mg by mouth daily.     Rhubarb (ESTROVEN COMPLETE PO) Take 1 tablet by mouth daily.       rosuvastatin (CRESTOR) 20 MG tablet TAKE 1 TABLET(20 MG) BY MOUTH DAILY 90 tablet 3   Turmeric (QC TUMERIC COMPLEX PO) Take 1 tablet by mouth daily.      vitamin B-12 (CYANOCOBALAMIN) 500 MCG tablet Take 500 mcg by mouth daily.     No current facility-administered medications on file prior to visit.     Review of Systems     Objective:   Vitals:   04/13/23 0803  BP: 130/70  Pulse: 84  Temp: 97.7 F (36.5 C)  SpO2: 97%   BP Readings from Last 3 Encounters:  04/13/23 130/70  12/06/22 120/76  09/20/22 138/68   Wt Readings from Last 3 Encounters:  04/13/23 167 lb 6.4 oz (75.9 kg)  12/06/22 171 lb (77.6 kg)  09/20/22 172 lb (78 kg)   Body mass index is 28.73 kg/m.    Physical Exam Constitutional:      General: She is not in acute distress.    Appearance: Normal appearance. She is not ill-appearing.  HENT:     Head: Normocephalic and atraumatic.  Skin:    General: Skin is warm and dry.  Neurological:     Mental Status: She is alert. Mental status  is at baseline.  Psychiatric:        Behavior: Behavior normal.        Thought Content: Thought content normal.        Judgment: Judgment normal.     Comments: Mildly anxious and depressed        Lab Results  Component Value Date   WBC 3.1 (L) 05/31/2022   HGB 14.2 05/31/2022   HCT 41.0 05/31/2022   PLT 251.0 05/31/2022   GLUCOSE 106 (H) 05/31/2022   CHOL 174 05/31/2022   TRIG 51.0 05/31/2022   HDL 80.80 05/31/2022   LDLCALC 83 05/31/2022   ALT 18 05/31/2022   AST 24 05/31/2022   NA 141 05/31/2022   K 4.3 05/31/2022   CL 100 05/31/2022   CREATININE 0.87 05/31/2022   BUN 12 05/31/2022   CO2 31 05/31/2022   TSH 1.45 05/25/2021   HGBA1C 5.6 05/31/2022     Assessment & Plan:    See Problem List for Assessment and Plan of chronic medical problems.

## 2023-04-13 ENCOUNTER — Ambulatory Visit (INDEPENDENT_AMBULATORY_CARE_PROVIDER_SITE_OTHER): Payer: Medicare Other | Admitting: Internal Medicine

## 2023-04-13 VITALS — BP 130/70 | HR 84 | Temp 97.7°F | Ht 64.0 in | Wt 167.4 lb

## 2023-04-13 DIAGNOSIS — F419 Anxiety disorder, unspecified: Secondary | ICD-10-CM

## 2023-04-13 DIAGNOSIS — E782 Mixed hyperlipidemia: Secondary | ICD-10-CM

## 2023-04-13 DIAGNOSIS — R739 Hyperglycemia, unspecified: Secondary | ICD-10-CM

## 2023-04-13 DIAGNOSIS — E559 Vitamin D deficiency, unspecified: Secondary | ICD-10-CM

## 2023-04-13 MED ORDER — ALPRAZOLAM 0.25 MG PO TABS: 0.2500 mg | ORAL_TABLET | Freq: Every day | ORAL | 0 refills | Status: AC | PRN

## 2023-04-13 NOTE — Assessment & Plan Note (Signed)
Acute Experiencing increased anxiety and feeling of being overwhelmed related to stresses. She does not feel like she needs something on a regular basis just once in a while when she gets very stressed or overwhelmed Has taken alprazolam in the past and did well with that-low risk for abuse She understands this is addicting Start alprazolam 0.25 mg daily as needed for anxiety

## 2023-05-18 DIAGNOSIS — M25562 Pain in left knee: Secondary | ICD-10-CM | POA: Diagnosis not present

## 2023-05-23 ENCOUNTER — Telehealth: Payer: Self-pay | Admitting: Cardiology

## 2023-05-23 NOTE — Telephone Encounter (Signed)
Called pt to schedule tele. Pt stated that [place] is [place]. Please call her back tomorrow

## 2023-05-23 NOTE — Telephone Encounter (Signed)
   Name: TAMER STEINLE  DOB: Apr 01, 1951  MRN: [id number]  Primary Cardiologist: Donato Schultz, MD  Chart reviewed as part of pre-operative protocol coverage. Because of Ranessa Petrenko Bossier's past medical history and time since last visit, she will require a follow-up in-office visit in order to better assess preoperative cardiovascular risk.  Pre-op covering staff: - Please schedule appointment and call patient to inform them. If patient already had an upcoming appointment within acceptable timeframe, please add "pre-op clearance" to the appointment notes so provider is aware. - Please contact requesting surgeon's office via preferred method (i.e, phone, fax) to inform them of need for appointment prior to surgery.    Napoleon Form, Leodis Rains, NP  05/23/2023, 4:36 PM

## 2023-05-23 NOTE — Telephone Encounter (Signed)
   Pre-operative Risk Assessment    Patient Name: Lori [name]  DOB: 1951-08-23 MRN: [id number]      Request for Surgical Clearance    Procedure:   left total knee replacement  Date of Surgery:  TBD                                   Surgeon:  Dr Jodi Geralds Surgeon's Group or Practice Name:   Phone number:  [phone] Fax number:  [phone]   Type of Clearance Requested:  medical and medicine- not clear about patuent's medicinet    Type of Anesthesia:  Spinal   Additional requests/questions:    Signed, Laurence Ferrari   05/23/2023, 4:22 PM

## 2023-05-24 ENCOUNTER — Telehealth: Payer: Self-pay | Admitting: Internal Medicine

## 2023-05-24 ENCOUNTER — Encounter (INDEPENDENT_AMBULATORY_CARE_PROVIDER_SITE_OTHER): Payer: Self-pay

## 2023-05-24 NOTE — Telephone Encounter (Signed)
We have received Surgical Clearance forms from [facility] and they have been placed in the provider's box.   Please fax to:  [phone]

## 2023-05-24 NOTE — Telephone Encounter (Signed)
Pt has been scheduled for in office appt for pre op clearance 06/08/23 with Robin Searing, NP 8:50. Pt thanked me for the call. I will update all parties involved.

## 2023-05-24 NOTE — Telephone Encounter (Signed)
Placed in Dr. Lawerance Bach folder

## 2023-05-26 NOTE — Telephone Encounter (Signed)
Clearance form and last OV faxed today and fax conformation received.

## 2023-06-01 ENCOUNTER — Ambulatory Visit
Admission: EM | Admit: 2023-06-01 | Discharge: 2023-06-01 | Disposition: A | Discharge status: Home / Self Care | Payer: Federal, State, Local not specified - PPO | Attending: Nurse Practitioner | Admitting: Nurse Practitioner

## 2023-06-01 ENCOUNTER — Encounter: Payer: Self-pay | Admitting: Emergency Medicine

## 2023-06-01 DIAGNOSIS — S60862A Insect bite (nonvenomous) of left wrist, initial encounter: Secondary | ICD-10-CM

## 2023-06-01 DIAGNOSIS — L282 Other prurigo: Secondary | ICD-10-CM

## 2023-06-01 DIAGNOSIS — W57XXXA Bitten or stung by nonvenomous insect and other nonvenomous arthropods, initial encounter: Secondary | ICD-10-CM | POA: Diagnosis not present

## 2023-06-01 MED ORDER — PREDNISONE 20 MG PO TABS: 40.0000 mg | ORAL_TABLET | Freq: Every day | ORAL | 0 refills | Status: AC

## 2023-06-01 MED ORDER — DEXAMETHASONE SODIUM PHOSPHATE 10 MG/ML IJ SOLN
10.0000 mg | INTRAMUSCULAR | Status: AC
  Administered 2023-06-01: 10 mg via INTRAMUSCULAR

## 2023-06-01 NOTE — ED Provider Notes (Signed)
[facility]    CSN: [id number] Arrival date & time: 06/01/23  7425      History   Chief Complaint No chief complaint on file.   HPI Lori [name] is a 72 y.o. female.   The history is provided by the patient.   Patient presents for complaints of swelling to the left wrist.  Patient states she was out in her yard working last evening.  She states that she felt something bite her, but did not see anything.  She states she went inside the house, put alcohol on it and came back out and began working.  She states over the past several hours, she has had increased swelling to the left wrist along with itching.  She also states that the left hand feels numb.  She reports she has continued to use alcohol and calamine lotion to the wrist.  She denies fever, chills, chest pain, shortness of breath, wheezing, or difficulty breathing.  Past Medical History:  Diagnosis Date   Acute pain of left shoulder 07/03/2019   Arthritis of right knee 07/24/2019   Atypical chest pain 11/14/2018   Chronic pain of right knee 05/22/2019   CTS (carpal tunnel syndrome)    chronic   Depression 01/16/2013   Family history of colon cancer 06/06/2014   Headache 06/28/2019   History of colon polyps    Hyperglycemia 05/08/2018   Hyperlipidemia 04/27/2017   Left arm numbness 11/21/2017   Left wrist pain 07/03/2019   Osteopenia    Personal history of colonic polyps 06/06/2014   PONV (postoperative nausea and vomiting)    Pyelonephritis 05/02/2017   Stress at home 11/14/2018   stress w/dtr who has ovarian cancer 1/20   Tubular adenoma    UTI (urinary tract infection)    Vertigo 04/17/2018    Patient Active Problem List   Diagnosis Date Noted   Anxiety 04/13/2023   Vitamin D deficiency 04/12/2023   Leg swelling 12/06/2022   Aortic atherosclerosis (HCC) 05/31/2022   Osteoarthritis of left knee 05/18/2022   Allergic rhinitis 03/14/2022   Nonvenomous snake bite 07/16/2021   Candidiasis of breast 04/28/2021    Acute medial meniscus tear, left, initial encounter 07/30/2020   Acute tear lateral meniscus, left, initial encounter 07/30/2020   Closed subchondral insufficiency fracture of condyle of left femur (HCC) 07/30/2020   Baker's cyst of knee, left 06/04/2020   Atherosclerosis of right iliac artery 05/21/2020   Arthritis of right knee 07/24/2019   Right shoulder pain 07/03/2019   Stress at home 11/14/2018   Hyperglycemia 05/08/2018   Vertigo 04/17/2018   Bilateral arm numbness and tingling while sleeping 11/21/2017   Pyelonephritis 05/02/2017   Hyperlipidemia 04/27/2017   Personal history of colonic polyps 06/06/2014   Family history of colon cancer 06/06/2014   Osteopenia 11/04/2013    Past Surgical History:  Procedure Laterality Date   ABDOMINAL HYSTERECTOMY  1994   fibroids   APPENDECTOMY     BUNIONECTOMY Right    BUNIONECTOMY Left 2018   CHOLECYSTECTOMY     COLONOSCOPY  12/09/2008   Dr.Rourk- normal rectum, polyp at the hepatic flexure bx= tubular adenoma. the remainder of the colonic mucosa and terminal ileum mucosa appeared normal.   COLONOSCOPY  2006   Dr.Sam [place]- colonic adenoma   COLONOSCOPY N/A 06/25/2014   Procedure: COLONOSCOPY;  Surgeon: Corbin Ade, MD;  Location: [facility];  Service: Endoscopy;  Laterality: N/A;  1030 DR REQUEST TIME   HEMORRHOID SURGERY  KNEE ARTHROSCOPY Right    torn meniscus   ORIF TOE FRACTURE Right    rotator Cuff surgery     TUBAL LIGATION      OB History   No obstetric history on file.      Home Medications    Prior to Admission medications   Medication Sig Start Date End Date Taking? Authorizing Provider  predniSONE (DELTASONE) 20 MG tablet Take 2 tablets (40 mg total) by mouth daily with breakfast for 5 days. 06/01/23 06/06/23 Yes Danasia Baker-Warren, Sadie Haber, NP  ALPRAZolam Prudy Feeler) 0.25 MG tablet Take 1 tablet (0.25 mg total) by mouth daily as needed for anxiety. 04/13/23   Pincus Sanes, MD  Ascorbic Acid (VITAMIN C) 500 MG  CHEW Chew 1 tablet by mouth daily.    [provider]  b complex vitamins capsule Take 1 capsule by mouth daily. 05/22/19   Pincus Sanes, MD  Calcium Carbonate-Vitamin D 600-400 MG-UNIT tablet Take 1 tablet by mouth daily. 05/22/19   Pincus Sanes, MD  Cholecalciferol (VITAMIN D3) 50 MCG (2000 UT) TABS Take 2,000 Units by mouth daily.     [provider]  COD LIVER OIL PO Take 1 capsule by mouth daily.     [provider]  conjugated estrogens (PREMARIN) vaginal cream Place 1 Applicatorful vaginally as needed.    [provider]  Ginkgo Biloba 60 MG CAPS Take 60 mg by mouth daily.     [provider]  Misc Natural Products (TART CHERRY ADVANCED) CAPS Take 1 capsule by mouth daily.     [provider]  Multiple Vitamins-Minerals (ONE-A-DAY WOMENS 50 PLUS PO) Take 1 tablet by mouth.     [provider]  pyridOXINE (VITAMIN B-6) 100 MG tablet Take 100 mg by mouth daily.    [provider]  Rhubarb (ESTROVEN COMPLETE PO) Take 1 tablet by mouth daily.     [provider]  rosuvastatin (CRESTOR) 20 MG tablet TAKE 1 TABLET(20 MG) BY MOUTH DAILY 12/06/22   Burns, Bobette Mo, MD  Turmeric (QC TUMERIC COMPLEX PO) Take 1 tablet by mouth daily.     [provider]  vitamin B-12 (CYANOCOBALAMIN) 500 MCG tablet Take 500 mcg by mouth daily.    [provider]    Family History Family History  Problem Relation Age of Onset   Bipolar disorder Sister    Schizophrenia Sister    Kidney disease Sister    Hypertension Sister    Bipolar disorder Sister    Kidney disease Sister    Hypertension Mother    Mental illness Mother    Dementia Mother    Pancreatic cancer Mother    Cancer Father 15       colon; bladder   Hypertension Father    Heart disease Father    Arthritis Father    Prostate cancer Father    Esophageal cancer Father    Stomach cancer Father    Colon cancer Father    Kidney disease Father     Bladder Cancer Father    Ovarian cancer Daughter    Bipolar disorder Son    Liver cancer Neg Hx     Social History Social History   Tobacco Use   Smoking status: Former    Current packs/day: 0.00    Average packs/day: 1 pack/day for 22.6 years (22.6 ttl pk-yrs)    Types: Cigarettes    Start date: 04/13/1971    Quit date: 11/01/1993    Years  since quitting: 29.6   Smokeless tobacco: Never  Vaping Use   Vaping status: Never Used  Substance Use Topics   Alcohol use: Yes    Alcohol/week: 0.0 standard drinks of alcohol    Comment: wine   Drug use: No     Allergies   Codeine, Doxycycline, Hycodan [hydrocodone bit-homatrop mbr], Penicillins, and Tramadol   Review of Systems Review of Systems Per HPI  Physical Exam Triage Vital Signs ED Triage Vitals  Encounter Vitals Group     BP 06/01/23 0819 (!) 161/87     Systolic BP Percentile --      Diastolic BP Percentile --      Pulse Rate 06/01/23 0819 85     Resp 06/01/23 0819 17     Temp 06/01/23 0819 98.3 F (36.8 C)     Temp Source 06/01/23 0819 Oral     SpO2 06/01/23 0819 96 %     Weight --      Height --      Head Circumference --      Peak Flow --      Pain Score 06/01/23 0823 6     Pain Loc --      Pain Education --      Exclude from Growth Chart --    No data found.  Updated Vital Signs BP (!) 161/87 (BP Location: Right Arm)   Pulse 85   Temp 98.3 F (36.8 C) (Oral)   Resp 17   SpO2 96%   Visual Acuity Right Eye Distance:   Left Eye Distance:   Bilateral Distance:    Right Eye Near:   Left Eye Near:    Bilateral Near:     Physical Exam Vitals and nursing note reviewed.  Constitutional:      General: She is not in acute distress.    Appearance: Normal appearance.  HENT:     Head: Normocephalic.  Eyes:     Extraocular Movements: Extraocular movements intact.     Pupils: Pupils are equal, round, and reactive to light.  Cardiovascular:     Rate and Rhythm: Normal rate and regular rhythm.      Pulses: Normal pulses.     Heart sounds: Normal heart sounds.  Pulmonary:     Effort: Pulmonary effort is normal. No respiratory distress.     Breath sounds: Normal breath sounds. No stridor. No wheezing, rhonchi or rales.  Abdominal:     General: Bowel sounds are normal.     Palpations: Abdomen is soft.  Musculoskeletal:     Left wrist: Swelling present.     Cervical back: Normal range of motion.     Comments: Erythema and swelling noted to the left wrist.  There is no oozing, fluctuance, or drainage present.  Lymphadenopathy:     Cervical: No cervical adenopathy.  Skin:    General: Skin is warm and dry.  Neurological:     General: No focal deficit present.     Mental Status: She is alert and oriented to person, place, and time.  Psychiatric:        Mood and Affect: Mood normal.        Behavior: Behavior normal.      UC Treatments / Results  Labs (all labs ordered are listed, but only abnormal results are displayed) Labs Reviewed - No data to display  EKG   Radiology No results found.  Procedures Procedures (including critical care time)  Medications Ordered in UC Medications  dexamethasone (DECADRON)  injection 10 mg (10 mg Intramuscular Given 06/01/23 0900)    Initial Impression / Assessment and Plan / UC Course  I have reviewed the triage vital signs and the nursing notes.  Pertinent labs & imaging results that were available during my care of the patient were reviewed by me and considered in my medical decision making (see chart for details).  The patient is well-appearing, she is in no acute distress, vital signs are stable.  Symptoms appear to be consistent with papular urticaria.  Decadron 10 mg IM administered.  Will start patient on prednisone 40 mg for the next 5 days.  Supportive care recommendations were provided and discussed with the patient to include over-the-counter Zyrtec or Benadryl for itching, the use of ice, and to continue to monitor the area  for worsening.  Patient advised that if symptoms fail to improve with this treatment, recommend follow-up in this clinic or in the emergency department for further evaluation.  Patient is in agreement with this plan of care and verbalizes understanding.  All questions were answered.  Patient stable for discharge.   Final Clinical Impressions(s) / UC Diagnoses   Final diagnoses:  Papular urticaria  Insect bite of left wrist, initial encounter     Discharge Instructions      You were given an injection of Decadron 10 mg in the clinic.  Do not start the prednisone until 06/02/2023. Take medication as prescribed. May take over-the-counter Tylenol as needed for pain or discomfort. Apply cool compresses to the left wrist to help with pain or swelling. May take over-the-counter Zyrtec during the daytime and Benadryl at bedtime to help with itching. If symptoms fail to improve with this treatment, please follow-up in this clinic or with your primary care physician for further evaluation. Follow-up as needed.     ED Prescriptions     Medication Sig Dispense Auth. Provider   predniSONE (DELTASONE) 20 MG tablet Take 2 tablets (40 mg total) by mouth daily with breakfast for 5 days. 10 tablet Jaedan Huttner-Warren, Sadie Haber, NP      PDMP not reviewed this encounter.   Abran Cantor, NP 06/01/23 [phone]

## 2023-06-01 NOTE — ED Triage Notes (Signed)
Bite to left hand yesterday.  Swelling noted to hand.  States area hurts and itches

## 2023-06-01 NOTE — Discharge Instructions (Addendum)
You were given an injection of Decadron 10 mg in the clinic.  Do not start the prednisone until 06/02/2023. Take medication as prescribed. May take over-the-counter Tylenol as needed for pain or discomfort. Apply cool compresses to the left wrist to help with pain or swelling. May take over-the-counter Zyrtec during the daytime and Benadryl at bedtime to help with itching. If symptoms fail to improve with this treatment, please follow-up in this clinic or with your primary care physician for further evaluation. Follow-up as needed.

## 2023-06-05 ENCOUNTER — Encounter: Payer: Self-pay | Admitting: Internal Medicine

## 2023-06-05 NOTE — Progress Notes (Signed)
Subjective:    Patient ID: Lori [name], female    DOB: [birth year], 72 y.o.   MRN: [id number]      HPI Lori [name] is here for a Physical exam and her chronic medical problems.   No concerns.   To have L TKR.     Medications and allergies reviewed with patient and updated if appropriate.  Current Outpatient Medications on File Prior to Visit  Medication Sig Dispense Refill   ALPRAZolam (XANAX) 0.25 MG tablet Take 1 tablet (0.25 mg total) by mouth daily as needed for anxiety. 30 tablet 0   Ascorbic Acid (VITAMIN C) 500 MG CHEW Chew 1 tablet by mouth daily.     b complex vitamins capsule Take 1 capsule by mouth daily.     Calcium Carbonate-Vitamin D 600-400 MG-UNIT tablet Take 1 tablet by mouth daily.     Cholecalciferol (VITAMIN D3) 50 MCG (2000 UT) TABS Take 2,000 Units by mouth daily.      COD LIVER OIL PO Take 1 capsule by mouth daily.      conjugated estrogens (PREMARIN) vaginal cream Place 1 Applicatorful vaginally as needed.     Ginkgo Biloba 60 MG CAPS Take 60 mg by mouth daily.      Misc Natural Products (TART CHERRY ADVANCED) CAPS Take 1 capsule by mouth daily.      Multiple Vitamins-Minerals (ONE-A-DAY WOMENS 50 PLUS PO) Take 1 tablet by mouth.      predniSONE (DELTASONE) 20 MG tablet Take 2 tablets (40 mg total) by mouth daily with breakfast for 5 days. 10 tablet 0   pyridOXINE (VITAMIN B-6) 100 MG tablet Take 100 mg by mouth daily.     Rhubarb (ESTROVEN COMPLETE PO) Take 1 tablet by mouth daily.      rosuvastatin (CRESTOR) 20 MG tablet TAKE 1 TABLET(20 MG) BY MOUTH DAILY 90 tablet 3   Turmeric (QC TUMERIC COMPLEX PO) Take 1 tablet by mouth daily.      vitamin B-12 (CYANOCOBALAMIN) 500 MCG tablet Take 500 mcg by mouth daily.     No current facility-administered medications on file prior to visit.    Review of Systems  Constitutional:  Negative for fever.  Eyes:  Negative for visual disturbance.  Respiratory:  Negative for cough, shortness of breath and wheezing.    Cardiovascular:  Negative for chest pain, palpitations and leg swelling.  Gastrointestinal:  Negative for abdominal pain, blood in stool, constipation and diarrhea.       No gerd  Genitourinary:  Negative for dysuria.  Musculoskeletal:  Positive for arthralgias. Negative for back pain.  Skin:  Negative for rash.  Neurological:  Negative for light-headedness and headaches.  Psychiatric/Behavioral:  Negative for dysphoric mood. The patient is nervous/anxious (improved).        Objective:   Vitals:   06/06/23 0948  BP: 120/74  Pulse: 70  Temp: 98.2 F (36.8 C)  SpO2: 97%   Filed Weights   06/06/23 0948  Weight: 168 lb (76.2 kg)   Body mass index is 28.84 kg/m.  BP Readings from Last 3 Encounters:  06/06/23 120/74  06/01/23 (!) 161/87  04/13/23 130/70    Wt Readings from Last 3 Encounters:  06/06/23 168 lb (76.2 kg)  04/13/23 167 lb 6.4 oz (75.9 kg)  12/06/22 171 lb (77.6 kg)       Physical Exam Constitutional: She appears well-developed and well-nourished. No distress.  HENT:  Head: Normocephalic and atraumatic.  Right Ear: External ear normal. Normal ear canal  and TM Left Ear: External ear normal.  Normal ear canal and TM Mouth/Throat: Oropharynx is clear and moist.  Eyes: Conjunctivae normal.  Neck: Neck supple. No tracheal deviation present. No thyromegaly present.  No carotid bruit  Cardiovascular: Normal rate, regular rhythm and normal heart sounds.   No murmur heard.  No edema. Pulmonary/Chest: Effort normal and breath sounds normal. No respiratory distress. She has no wheezes. She has no rales.  Breast: deferred   Abdominal: Soft. She exhibits no distension. There is no tenderness.  Lymphadenopathy: She has no cervical adenopathy.  Skin: Skin is warm and dry. She is not diaphoretic.  Psychiatric: She has a normal mood and affect. Her behavior is normal.     Lab Results  Component Value Date   WBC 3.1 (L) 05/31/2022   HGB 14.2 05/31/2022   HCT  41.0 05/31/2022   PLT 251.0 05/31/2022   GLUCOSE 106 (H) 05/31/2022   CHOL 174 05/31/2022   TRIG 51.0 05/31/2022   HDL 80.80 05/31/2022   LDLCALC 83 05/31/2022   ALT 18 05/31/2022   AST 24 05/31/2022   NA 141 05/31/2022   K 4.3 05/31/2022   CL 100 05/31/2022   CREATININE 0.87 05/31/2022   BUN 12 05/31/2022   CO2 31 05/31/2022   TSH 1.45 05/25/2021   HGBA1C 5.6 05/31/2022         Assessment & Plan:   Physical exam: Screening blood work  ordered Exercise  yard work Weight  is good Substance abuse  none   Reviewed recommended immunizations.   Health Maintenance  Topic Date Due   Zoster Vaccines- Shingrix (1 of 2) Never done   COVID-19 Vaccine (3 - 2023-24 season) 06/24/2022   Medicare Annual Wellness (AWV)  06/01/2023   INFLUENZA VACCINE  01/22/2024 (Originally 05/25/2023)   DEXA SCAN  06/30/2023   Colonoscopy  10/01/2024   MAMMOGRAM  10/27/2024   DTaP/Tdap/Td (2 - Td or Tdap) 05/08/2028   Pneumonia Vaccine 38+ Years old  Completed   Hepatitis C Screening  Completed   HPV VACCINES  Aged Out          See Problem List for Assessment and Plan of chronic medical problems.

## 2023-06-05 NOTE — Patient Instructions (Addendum)

## 2023-06-06 ENCOUNTER — Ambulatory Visit (INDEPENDENT_AMBULATORY_CARE_PROVIDER_SITE_OTHER): Payer: Medicare Other | Admitting: Internal Medicine

## 2023-06-06 VITALS — BP 120/74 | HR 70 | Temp 98.2°F | Ht 64.0 in | Wt 168.0 lb

## 2023-06-06 DIAGNOSIS — Z0000 Encounter for general adult medical examination without abnormal findings: Secondary | ICD-10-CM | POA: Diagnosis not present

## 2023-06-06 DIAGNOSIS — R739 Hyperglycemia, unspecified: Secondary | ICD-10-CM

## 2023-06-06 DIAGNOSIS — M8589 Other specified disorders of bone density and structure, multiple sites: Secondary | ICD-10-CM

## 2023-06-06 DIAGNOSIS — F419 Anxiety disorder, unspecified: Secondary | ICD-10-CM

## 2023-06-06 DIAGNOSIS — E782 Mixed hyperlipidemia: Secondary | ICD-10-CM

## 2023-06-06 DIAGNOSIS — E559 Vitamin D deficiency, unspecified: Secondary | ICD-10-CM | POA: Diagnosis not present

## 2023-06-06 LAB — HEMOGLOBIN A1C: Hgb A1c MFr Bld: 5.6 % (ref 4.6–6.5)

## 2023-06-06 LAB — CBC WITH DIFFERENTIAL/PLATELET
Basophils Absolute: 0.1 10*3/uL (ref 0.0–0.1)
Basophils Relative: 0.9 % (ref 0.0–3.0)
Eosinophils Absolute: 0.1 10*3/uL (ref 0.0–0.7)
Eosinophils Relative: 1.2 % (ref 0.0–5.0)
HCT: 43.6 % (ref 36.0–46.0)
Hemoglobin: 14.4 g/dL (ref 12.0–15.0)
Lymphocytes Relative: 46.5 % — ABNORMAL HIGH (ref 12.0–46.0)
Lymphs Abs: 2.7 10*3/uL (ref 0.7–4.0)
MCHC: 33.1 g/dL (ref 30.0–36.0)
MCV: 92.3 fl (ref 78.0–100.0)
Monocytes Absolute: 0.5 10*3/uL (ref 0.1–1.0)
Monocytes Relative: 8.8 % (ref 3.0–12.0)
Neutro Abs: 2.5 10*3/uL (ref 1.4–7.7)
Neutrophils Relative %: 42.6 % — ABNORMAL LOW (ref 43.0–77.0)
Platelets: 281 10*3/uL (ref 150.0–400.0)
RBC: 4.72 Mil/uL (ref 3.87–5.11)
RDW: 14.4 % (ref 11.5–15.5)
WBC: 5.9 10*3/uL (ref 4.0–10.5)

## 2023-06-06 LAB — COMPREHENSIVE METABOLIC PANEL
ALT: 18 U/L (ref 0–35)
AST: 18 U/L (ref 0–37)
Albumin: 4.5 g/dL (ref 3.5–5.2)
Alkaline Phosphatase: 67 U/L (ref 39–117)
BUN: 11 mg/dL (ref 6–23)
CO2: 31 mEq/L (ref 19–32)
Calcium: 9.6 mg/dL (ref 8.4–10.5)
Chloride: 99 mEq/L (ref 96–112)
Creatinine, Ser: 0.78 mg/dL (ref 0.40–1.20)
GFR: 77.1 mL/min (ref >60.00)
Glucose, Bld: 94 mg/dL (ref 70–99)
Potassium: 3.4 mEq/L — ABNORMAL LOW (ref 3.5–5.1)
Sodium: 138 mEq/L (ref 135–145)
Total Bilirubin: 1.3 mg/dL — ABNORMAL HIGH (ref 0.2–1.2)
Total Protein: 7.1 g/dL (ref 6.0–8.3)

## 2023-06-06 LAB — LIPID PANEL
Cholesterol: 256 mg/dL — ABNORMAL HIGH (ref 0–200)
HDL: 93.7 mg/dL (ref >39.00)
LDL Cholesterol: 130 mg/dL — ABNORMAL HIGH (ref 0–99)
NonHDL: 162.13
Total CHOL/HDL Ratio: 3
Triglycerides: 161 mg/dL — ABNORMAL HIGH (ref 0.0–149.0)
VLDL: 32.2 mg/dL (ref 0.0–40.0)

## 2023-06-06 LAB — TSH: TSH: 1.74 u[IU]/mL (ref 0.35–5.50)

## 2023-06-06 LAB — VITAMIN D 25 HYDROXY (VIT D DEFICIENCY, FRACTURES): VITD: 44.57 ng/mL (ref 30.00–100.00)

## 2023-06-06 NOTE — Assessment & Plan Note (Signed)
Chronic Intermittent Controlled Continue alprazolam 0.25 mg daily as needed for anxiety

## 2023-06-06 NOTE — Assessment & Plan Note (Signed)
Chronic DEXA due-ordered for [facility] Exercise is limited by knee osteoarthritis-encouraged her to do what she can do Continue calcium and vitamin D daily Check vitamin D level

## 2023-06-06 NOTE — Assessment & Plan Note (Signed)
Chronic Taking vitamin D daily Check vitamin D level  

## 2023-06-06 NOTE — Progress Notes (Unsigned)
Office Visit    Patient Name: Lori [name] Date of Encounter: 06/06/2023  Primary Care Provider:  Pincus Sanes, MD Primary Cardiologist:  Donato Schultz, MD Primary Electrophysiologist: None   Past Medical History    Past Medical History:  Diagnosis Date   Acute pain of left shoulder 07/03/2019   Arthritis of right knee 07/24/2019   Atypical chest pain 11/14/2018   Chronic pain of right knee 05/22/2019   CTS (carpal tunnel syndrome)    chronic   Depression 01/16/2013   Family history of colon cancer 06/06/2014   Headache 06/28/2019   History of colon polyps    Hyperglycemia 05/08/2018   Hyperlipidemia 04/27/2017   Left arm numbness 11/21/2017   Left wrist pain 07/03/2019   Osteopenia    Personal history of colonic polyps 06/06/2014   PONV (postoperative nausea and vomiting)    Pyelonephritis 05/02/2017   Stress at home 11/14/2018   stress w/dtr who has ovarian cancer 1/20   Tubular adenoma    UTI (urinary tract infection)    Vertigo 04/17/2018   Past Surgical History:  Procedure Laterality Date   ABDOMINAL HYSTERECTOMY  1994   fibroids   APPENDECTOMY     BUNIONECTOMY Right    BUNIONECTOMY Left 2018   CHOLECYSTECTOMY     COLONOSCOPY  12/09/2008   Dr.Rourk- normal rectum, polyp at the hepatic flexure bx= tubular adenoma. the remainder of the colonic mucosa and terminal ileum mucosa appeared normal.   COLONOSCOPY  2006   Dr.Sam Garey- colonic adenoma   COLONOSCOPY N/A 06/25/2014   Procedure: COLONOSCOPY;  Surgeon: Corbin Ade, MD;  Location: [facility];  Service: Endoscopy;  Laterality: N/A;  1030 DR REQUEST TIME   HEMORRHOID SURGERY     KNEE ARTHROSCOPY Right    torn meniscus   ORIF TOE FRACTURE Right    rotator Cuff surgery     TUBAL LIGATION      Allergies  Allergies  Allergen Reactions   Codeine     Nausea, vomiting   Doxycycline Other (See Comments)    Headache, nausea   Hycodan [Hydrocodone Bit-Homatrop Mbr]     N/V   Penicillins Nausea And Vomiting    Tramadol      History of Present Illness    Lori [name]  is a 72 year old female with a PMH of HLD, tobacco abuse, nonobstructive CAD atherosclerosis who presents today for preoperative clearance.  Lori [name] was seen initially by Dr. Anne Fu in 2021 for complaint of chest pain.  She completed a Cardiac CTA that showed calcium score of 0 with normal coronaries and no evidence of CAD with mild aortic atherosclerosis.  She is currently on Crestor for primary prevention and was last seen in our office on 06/16/2021.  There have been no interval ED visits for cardiac complaints.  Since last being seen in the office patient reports***.  Patient denies chest pain, palpitations, dyspnea, PND, orthopnea, nausea, vomiting, dizziness, syncope, edema, weight gain, or early satiety.     ***Notes: -Preop clearance for left total knee replacement Home Medications    Current Outpatient Medications  Medication Sig Dispense Refill   ALPRAZolam (XANAX) 0.25 MG tablet Take 1 tablet (0.25 mg total) by mouth daily as needed for anxiety. 30 tablet 0   Ascorbic Acid (VITAMIN C) 500 MG CHEW Chew 1 tablet by mouth daily.     b complex vitamins capsule Take 1 capsule by mouth daily.     Calcium Carbonate-Vitamin  D 600-400 MG-UNIT tablet Take 1 tablet by mouth daily.     Cholecalciferol (VITAMIN D3) 50 MCG (2000 UT) TABS Take 2,000 Units by mouth daily.      COD LIVER OIL PO Take 1 capsule by mouth daily.      conjugated estrogens (PREMARIN) vaginal cream Place 1 Applicatorful vaginally as needed.     Ginkgo Biloba 60 MG CAPS Take 60 mg by mouth daily.      Misc Natural Products (TART CHERRY ADVANCED) CAPS Take 1 capsule by mouth daily.      Multiple Vitamins-Minerals (ONE-A-DAY WOMENS 50 PLUS PO) Take 1 tablet by mouth.      predniSONE (DELTASONE) 20 MG tablet Take 2 tablets (40 mg total) by mouth daily with breakfast for 5 days. 10 tablet 0   pyridOXINE (VITAMIN B-6) 100 MG tablet Take 100 mg by mouth daily.      Rhubarb (ESTROVEN COMPLETE PO) Take 1 tablet by mouth daily.      rosuvastatin (CRESTOR) 20 MG tablet TAKE 1 TABLET(20 MG) BY MOUTH DAILY 90 tablet 3   Turmeric (QC TUMERIC COMPLEX PO) Take 1 tablet by mouth daily.      vitamin B-12 (CYANOCOBALAMIN) 500 MCG tablet Take 500 mcg by mouth daily.     No current facility-administered medications for this visit.     Review of Systems  Please see the history of present illness.    (+)*** (+)***  All other systems reviewed and are otherwise negative except as noted above.  Physical Exam    Wt Readings from Last 3 Encounters:  06/06/23 168 lb (76.2 kg)  04/13/23 167 lb 6.4 oz (75.9 kg)  12/06/22 171 lb (77.6 kg)   QM:VHQIO were no vitals filed for this visit.,There is no height or weight on file to calculate BMI.  Constitutional:      Appearance: Healthy appearance. Not in distress.  Neck:     Vascular: JVD normal.  Pulmonary:     Effort: Pulmonary effort is normal.     Breath sounds: No wheezing. No rales. Diminished in the bases Cardiovascular:     Normal rate. Regular rhythm. Normal S1. Normal S2.      Murmurs: There is no murmur.  Edema:    Peripheral edema absent.  Abdominal:     Palpations: Abdomen is soft non tender. There is no hepatomegaly.  Skin:    General: Skin is warm and dry.  Neurological:     General: No focal deficit present.     Mental Status: Alert and oriented to person, place and time.     Cranial Nerves: Cranial nerves are intact.  EKG/LABS/ Recent Cardiac Studies    ECG personally reviewed by me today - ***   Risk Assessment/Calculations:   {Does this patient have ATRIAL FIBRILLATION?:[id number]}        Lab Results  Component Value Date   WBC 3.1 (L) 05/31/2022   HGB 14.2 05/31/2022   HCT 41.0 05/31/2022   MCV 90.6 05/31/2022   PLT 251.0 05/31/2022   Lab Results  Component Value Date   CREATININE 0.87 05/31/2022   BUN 12 05/31/2022   NA 141 05/31/2022   K 4.3 05/31/2022   CL 100  05/31/2022   CO2 31 05/31/2022   Lab Results  Component Value Date   ALT 18 05/31/2022   AST 24 05/31/2022   ALKPHOS 66 05/31/2022   BILITOT 1.0 05/31/2022   Lab Results  Component Value Date   CHOL 174 05/31/2022  HDL 80.80 05/31/2022   LDLCALC 83 05/31/2022   TRIG 51.0 05/31/2022   CHOLHDL 2 05/31/2022    Lab Results  Component Value Date   HGBA1C 5.6 05/31/2022     Assessment & Plan    1.  Preoperative clearance: -Patient's RCRI score is 0.9%  2.  Chest pain: -Today patient reports***  3.  Hyperlipidemia: -Patient's last LDL cholesterol was***  4.Aortic atherosclerosis: -Continue GDMT with***      Disposition: Follow-up with Donato Schultz, MD or APP in *** months {Are you ordering a CV Procedure (e.g. stress test, cath, DCCV, TEE, etc)?   Press F2        :[id number]}   Medication Adjustments/Labs and Tests Ordered: Current medicines are reviewed at length with the patient today.  Concerns regarding medicines are outlined above.   Signed, Napoleon Form, Leodis Rains, NP 06/06/2023, 1:38 PM [facility]

## 2023-06-06 NOTE — Assessment & Plan Note (Signed)
Chronic Heart healthy diet encouraged Check lipid panel, CMP Continue Crestor 20 mg daily

## 2023-06-06 NOTE — Assessment & Plan Note (Signed)
Chronic Check a1c Low sugar / carb diet Stressed regular exercise  

## 2023-06-08 ENCOUNTER — Encounter: Payer: Self-pay | Admitting: Nurse Practitioner

## 2023-06-08 ENCOUNTER — Ambulatory Visit: Payer: Medicare Other | Attending: Nurse Practitioner | Admitting: Nurse Practitioner

## 2023-06-08 VITALS — BP 136/74 | HR 63 | Ht 64.0 in | Wt 166.4 lb

## 2023-06-08 DIAGNOSIS — I700 Atherosclerosis of aorta: Secondary | ICD-10-CM

## 2023-06-08 DIAGNOSIS — Z01810 Encounter for preprocedural cardiovascular examination: Secondary | ICD-10-CM | POA: Diagnosis not present

## 2023-06-08 DIAGNOSIS — R0789 Other chest pain: Secondary | ICD-10-CM

## 2023-06-08 DIAGNOSIS — E782 Mixed hyperlipidemia: Secondary | ICD-10-CM | POA: Diagnosis not present

## 2023-06-08 NOTE — Patient Instructions (Signed)
Medication Instructions:  Your physician recommends that you continue on your current medications as directed. Please refer to the Current Medication list given to you today. *If you need a refill on your cardiac medications before your next appointment, please call your pharmacy*   Lab Work: None Ordered   Testing/Procedures: None ordered   Follow-Up: At [facility], you and your health needs are our priority.  As part of our continuing mission to provide you with exceptional heart care, we have created designated Provider Care Teams.  These Care Teams include your primary Cardiologist (physician) and Advanced Practice Providers (APPs -  Physician Assistants and Nurse Practitioners) who all work together to provide you with the care you need, when you need it.  We recommend signing up for the patient portal called "MyChart".  Sign up information is provided on this After Visit Summary.  MyChart is used to connect with patients for Virtual Visits (Telemedicine).  Patients are able to view lab/test results, encounter notes, upcoming appointments, etc.  Non-urgent messages can be sent to your provider as well.   To learn more about what you can do with MyChart, go to [email or web address].    Your next appointment:   FOLLOW UP AS NEEDED   Provider:   Donato Schultz, MD     Other Instructions

## 2023-06-15 DIAGNOSIS — M1732 Unilateral post-traumatic osteoarthritis, left knee: Secondary | ICD-10-CM | POA: Diagnosis not present

## 2023-06-15 DIAGNOSIS — R262 Difficulty in walking, not elsewhere classified: Secondary | ICD-10-CM | POA: Diagnosis not present

## 2023-06-15 DIAGNOSIS — M25662 Stiffness of left knee, not elsewhere classified: Secondary | ICD-10-CM | POA: Diagnosis not present

## 2023-06-19 DIAGNOSIS — M25511 Pain in right shoulder: Secondary | ICD-10-CM | POA: Diagnosis not present

## 2023-06-23 ENCOUNTER — Ambulatory Visit: Payer: Medicare Other

## 2023-06-23 VITALS — BP 124/74 | Ht 64.0 in | Wt 168.0 lb

## 2023-06-23 DIAGNOSIS — Z0000 Encounter for general adult medical examination without abnormal findings: Secondary | ICD-10-CM

## 2023-06-23 NOTE — Progress Notes (Signed)
Subjective:   Lori [name] is a 72 y.o. female who presents for Medicare Annual (Subsequent) preventive examination.  Visit Complete: In person    Review of Systems    Cardiac Risk Factors include: advanced age (>72men, >70 women);dyslipidemia;Other (see comment), Risk factor comments: Aortic Atherosclerosis, Osteopenina,     Objective:    Today's Vitals   06/23/23 0858  BP: 124/74  Weight: 168 lb (76.2 kg)  Height: 5\' 4"  (1.626 m)   Body mass index is 28.84 kg/m.     06/23/2023    9:11 AM 12/25/2021    9:05 PM 10/01/2021   11:17 AM 03/04/2019   12:19 PM 05/01/2017   10:48 PM 06/25/2014    9:59 AM  Advanced Directives  Does Patient Have a Medical Advance Directive? Yes No Yes Yes No Yes  Type of Estate agent of [place];Living will  Healthcare Power of [place];Living will   Living will  Does patient want to make changes to medical advance directive?   No - Patient declined     Copy of Healthcare Power of Attorney in Chart? No - copy requested  No - copy requested   No - copy requested  Would patient like information on creating a medical advance directive?  No - Patient declined   No - Patient declined     Current Medications (verified) Outpatient Encounter Medications as of 06/23/2023  Medication Sig   Ascorbic Acid (VITAMIN C) 500 MG CHEW Chew 1 tablet by mouth daily.   b complex vitamins capsule Take 1 capsule by mouth daily.   Calcium Carbonate-Vitamin D 600-400 MG-UNIT tablet Take 1 tablet by mouth daily.   Cholecalciferol (VITAMIN D3) 50 MCG (2000 UT) TABS Take 2,000 Units by mouth daily.    COD LIVER OIL PO Take 1 capsule by mouth daily.    conjugated estrogens (PREMARIN) vaginal cream Place 1 Applicatorful vaginally as needed.   Ginkgo Biloba 60 MG CAPS Take 60 mg by mouth daily.    Misc Natural Products (TART CHERRY ADVANCED) CAPS Take 1 capsule by mouth daily.    Multiple Vitamins-Minerals (ONE-A-DAY WOMENS 50 PLUS PO) Take 1 tablet by  mouth.    pyridOXINE (VITAMIN B-6) 100 MG tablet Take 100 mg by mouth daily.   Rhubarb (ESTROVEN COMPLETE PO) Take 1 tablet by mouth daily.    rosuvastatin (CRESTOR) 20 MG tablet TAKE 1 TABLET(20 MG) BY MOUTH DAILY   Turmeric (QC TUMERIC COMPLEX PO) Take 1 tablet by mouth daily.    vitamin B-12 (CYANOCOBALAMIN) 500 MCG tablet Take 500 mcg by mouth daily.   ALPRAZolam (XANAX) 0.25 MG tablet Take 1 tablet (0.25 mg total) by mouth daily as needed for anxiety. (Patient not taking: Reported on 06/23/2023)   No facility-administered encounter medications on file as of 06/23/2023.    Allergies (verified) Codeine, Doxycycline, Hycodan [hydrocodone bit-homatrop mbr], Penicillins, and Tramadol   History: Past Medical History:  Diagnosis Date   Acute pain of left shoulder 07/03/2019   Arthritis of right knee 07/24/2019   Atypical chest pain 11/14/2018   Chronic pain of right knee 05/22/2019   CTS (carpal tunnel syndrome)    chronic   Depression 01/16/2013   Family history of colon cancer 06/06/2014   Headache 06/28/2019   History of colon polyps    Hyperglycemia 05/08/2018   Hyperlipidemia 04/27/2017   Left arm numbness 11/21/2017   Left wrist pain 07/03/2019   Osteopenia    Personal history of colonic polyps 06/06/2014   PONV (  postoperative nausea and vomiting)    Pyelonephritis 05/02/2017   Stress at home 11/14/2018   stress w/dtr who has ovarian cancer 1/20   Tubular adenoma    UTI (urinary tract infection)    Vertigo 04/17/2018   Past Surgical History:  Procedure Laterality Date   ABDOMINAL HYSTERECTOMY  1994   fibroids   APPENDECTOMY     BUNIONECTOMY Right    BUNIONECTOMY Left 2018   CHOLECYSTECTOMY     COLONOSCOPY  12/09/2008   Dr.Rourk- normal rectum, polyp at the hepatic flexure bx= tubular adenoma. the remainder of the colonic mucosa and terminal ileum mucosa appeared normal.   COLONOSCOPY  2006   Dr.Sam Largo- colonic adenoma   COLONOSCOPY N/A 06/25/2014   Procedure: COLONOSCOPY;   Surgeon: Corbin Ade, MD;  Location[facility];  Service: Endoscopy;  Laterality: N/A;  1030 DR REQUEST TIME   HEMORRHOID SURGERY     KNEE ARTHROSCOPY Right    torn meniscus   ORIF TOE FRACTURE Right    rotator Cuff surgery     TUBAL LIGATION     Family History  Problem Relation Age of Onset   Bipolar disorder Sister    Schizophrenia Sister    Kidney disease Sister    Hypertension Sister    Bipolar disorder Sister    Kidney disease Sister    Hypertension Mother    Mental illness Mother    Dementia Mother    Pancreatic cancer Mother    Cancer Father 59       colon; bladder   Hypertension Father    Heart disease Father    Arthritis Father    Prostate cancer Father    Esophageal cancer Father    Stomach cancer Father    Colon cancer Father    Kidney disease Father    Bladder Cancer Father    Ovarian cancer Daughter    Bipolar disorder Son    Liver cancer Neg Hx    Social History   Socioeconomic History   Marital status: Widowed    Spouse name: Not on file   Number of children: 2   Years of education: Not on file   Highest education level: Not on file  Occupational History   Occupation: Retired    Associate Professor: [employer]  Tobacco Use   Smoking status: Former    Current packs/day: 0.00    Average packs/day: 1 pack/day for 22.6 years (22.6 ttl pk-yrs)    Types: Cigarettes    Start date: 04/13/1971    Quit date: 11/01/1993    Years since quitting: 29.6   Smokeless tobacco: Never  Vaping Use   Vaping status: Never Used  Substance and Sexual Activity   Alcohol use: Yes    Alcohol/week: 0.0 standard drinks of alcohol    Comment: wine   Drug use: No   Sexual activity: Yes    Birth control/protection: Surgical  Other Topics Concern   Not on file  Social History Narrative   Exercising regularly - senior exercises, walking   Has 2 children.   Lives alone   Social Determinants of Health   Financial Resource Strain: Low Risk  (06/23/2023)   Overall  Financial Resource Strain (CARDIA)    Difficulty of Paying Living Expenses: Not hard at all  Food Insecurity: No Food Insecurity (06/23/2023)   Hunger Vital Sign    Worried About Running Out of Food in the Last Year: Never true    Ran Out of Food in the  Last Year: Never true  Transportation Needs: No Transportation Needs (06/23/2023)   PRAPARE - Administrator, Civil Service (Medical): No    Lack of Transportation (Non-Medical): No  Physical Activity: Sufficiently Active (06/23/2023)   Exercise Vital Sign    Days of Exercise per Week: 4 days    Minutes of Exercise per Session: 40 min  Stress: No Stress Concern Present (06/23/2023)   [employer] of Occupational Health - Occupational Stress Questionnaire    Feeling of Stress : Not at all  Social Connections: Moderately Isolated (06/23/2023)   Social Connection and Isolation Panel [NHANES]    Frequency of Communication with Friends and Family: More than three times a week    Frequency of Social Gatherings with Friends and Family: Never    Attends Religious Services: More than 4 times per year    Active Member of [employer] or Organizations: No    Attends Banker Meetings: Never    Marital Status: Widowed    Tobacco Counseling Counseling given: Not Answered   Clinical Intake:  Pre-visit preparation completed: Yes  Pain : No/denies pain     BMI - recorded: 28.84 Nutritional Status: BMI 25 -29 Overweight Nutritional Risks: None Diabetes: No  How often do you need to have someone help you when you read instructions, pamphlets, or other written materials from your doctor or pharmacy?: 1 - Never  Interpreter Needed?: No  Information entered by :: Mansoor Hillyard, RMA   Activities of Daily Living    06/23/2023    9:02 AM  In your present state of health, do you have any difficulty performing the following activities:  Hearing? 0  Vision? 0  Difficulty concentrating or making decisions? 0  Walking or  climbing stairs? 1  Comment knee pain  Dressing or bathing? 0  Doing errands, shopping? 0  Preparing Food and eating ? N  Using the Toilet? N  In the past six months, have you accidently leaked urine? N  Do you have problems with loss of bowel control? N  Managing your Medications? N  Managing your Finances? N  Housekeeping or managing your Housekeeping? N    Patient Care Team: Pincus Sanes, MD as PCP - General (Internal Medicine) Jake Bathe, MD as PCP - Cardiology (Cardiology) Glendale Chard, DO as Consulting Physician (Neurology)  Indicate any recent Medical Services you may have received from other than [facility] providers in the past year (date may be approximate).     Assessment:   This is a routine wellness examination for Velta.  Hearing/Vision screen Hearing Screening - Comments:: Denies hearing difficulties   Vision Screening - Comments:: Wears eyeglasses  Dietary issues and exercise activities discussed:     Goals Addressed               This Visit's Progress     Patient Stated (pt-stated)        Would like get back into traveling after her knee surgery.      Depression Screen    06/23/2023    9:24 AM 06/06/2023   10:05 AM 04/13/2023    8:08 AM 12/06/2022    8:08 AM 09/20/2022    2:51 PM 05/31/2022    9:15 AM 05/31/2022    8:57 AM  PHQ 2/9 Scores  PHQ - 2 Score 0 0 2 0 0 0 0  PHQ- 9 Score 0 0  0 1 1 1     Fall Risk    06/23/2023  9:13 AM 06/06/2023   10:05 AM 04/13/2023    8:08 AM 12/06/2022    8:08 AM 09/20/2022    2:51 PM  Fall Risk   Falls in the past year? 0 0 0 0 0  Number falls in past yr: 0 0 1 0 0  Injury with Fall? 0 0 1 0 0  Risk for fall due to : No Fall Risks No Fall Risks No Fall Risks No Fall Risks No Fall Risks  Follow up Falls prevention discussed;Falls evaluation completed Falls evaluation completed Falls evaluation completed Falls evaluation completed Falls evaluation completed    MEDICARE RISK AT HOME: Medicare Risk at  Home Any stairs in or around the home?: Yes (5 steps outside) If so, are there any without handrails?: Yes Home free of loose throw rugs in walkways, pet beds, electrical cords, etc?: Yes Adequate lighting in your home to reduce risk of falls?: Yes Life alert?: No Use of a cane, walker or w/c?: No Grab bars in the bathroom?: No Shower chair or bench in shower?: No Elevated toilet seat or a handicapped toilet?: No  TIMED UP AND GO:  Was the test performed?  Yes  Length of time to ambulate 10 feet: 15 sec Gait steady and fast without use of assistive device    Cognitive Function:        06/23/2023    9:15 AM  6CIT Screen  What Year? 0 points  What month? 0 points  What time? 0 points  Count back from 20 0 points  Months in reverse 0 points  Repeat phrase 0 points  Total Score 0 points    Immunizations Immunization History  Administered Date(s) Administered   Fluad Quad(high Dose 65+) 07/03/2019   Influenza,inj,Quad PF,6+ Mos 07/31/2017   Influenza-Unspecified 07/31/2013, 12/16/2016   Moderna Sars-Covid-2 Vaccination 12/24/2019, 01/21/2020   PNEUMOCOCCAL CONJUGATE-20 05/31/2022   Tdap 05/08/2018    TDAP status: Up to date  Flu Vaccine status: Due, Education has been provided regarding the importance of this vaccine. Advised may receive this vaccine at local pharmacy or Health Dept. Aware to provide a copy of the vaccination record if obtained from local pharmacy or Health Dept. Verbalized acceptance and understanding.  Pneumococcal vaccine status: Up to date  Covid-19 vaccine status: Completed vaccines  Qualifies for Shingles Vaccine? Yes   Zostavax completed No   Shingrix Completed?: No.    Education has been provided regarding the importance of this vaccine. Patient has been advised to call insurance company to determine out of pocket expense if they have not yet received this vaccine. Advised may also receive vaccine at local pharmacy or Health Dept. Verbalized  acceptance and understanding.  Screening Tests Health Maintenance  Topic Date Due   Zoster Vaccines- Shingrix (1 of 2) Never done   COVID-19 Vaccine (3 - 2023-24 season) 06/24/2022   INFLUENZA VACCINE  01/22/2024 (Originally 05/25/2023)   DEXA SCAN  06/30/2023   Medicare Annual Wellness (AWV)  06/22/2024   Colonoscopy  10/01/2024   MAMMOGRAM  10/27/2024   DTaP/Tdap/Td (2 - Td or Tdap) 05/08/2028   Pneumonia Vaccine 83+ Years old  Completed   Hepatitis C Screening  Completed   HPV VACCINES  Aged Out    Health Maintenance  Health Maintenance Due  Topic Date Due   Zoster Vaccines- Shingrix (1 of 2) Never done   COVID-19 Vaccine (3 - 2023-24 season) 06/24/2022    Colorectal cancer screening: Type of screening: Colonoscopy. Completed 10/02/2019. Repeat every 5 years  Mammogram status: Completed 10/27/2022. Repeat every year  Bone Density status: Ordered 06/06/2023/. Pt provided with contact info and advised to call to schedule appt.  Lung Cancer Screening: (Low Dose CT Chest recommended if Age 36-80 years, 20 pack-year currently smoking OR have quit w/in 15years.) does not qualify.   Lung Cancer Screening Referral: N/A  Additional Screening:  Hepatitis C Screening: does qualify; Completed 04/18/2016  Vision Screening: Recommended annual ophthalmology exams for early detection of glaucoma and other disorders of the eye. Is the patient up to date with their annual eye exam?  Yes  Who is the provider or what is the name of the office in which the patient attends annual eye exams? [facility] If pt is not established with a provider, would they like to be referred to a provider to establish care? No .   Dental Screening: Recommended annual dental exams for proper oral hygiene   Community Resource Referral / Chronic Care Management: CRR required this visit?  No   CCM required this visit?  No     Plan:     I have personally reviewed and noted the following in the patient's  chart:   Medical and social history Use of alcohol, tobacco or illicit drugs  Current medications and supplements including opioid prescriptions. Patient is not currently taking opioid prescriptions. Functional ability and status Nutritional status Physical activity Advanced directives List of other physicians Hospitalizations, surgeries, and ER visits in previous 12 months Vitals Screenings to include cognitive, depression, and falls Referrals and appointments  In addition, I have reviewed and discussed with patient certain preventive protocols, quality metrics, and best practice recommendations. A written personalized care plan for preventive services as well as general preventive health recommendations were provided to patient.     Vaani Morren L Shravya Wickwire, CMA   06/23/2023   After Visit Summary: (MyChart) Due to this being a telephonic visit, the after visit summary with patients personalized plan was offered to patient via MyChart   Nurse Notes: Patient is due for a Shingrix vaccine, which she is thinking about getting soon.  She is also due for a DEXA which an order has been placed on 06/06/2023.  Patient is aware to call [facility] to schedule an appointment.   She had no other concerns to address today.

## 2023-06-23 NOTE — Patient Instructions (Addendum)
Lori [name] , Thank you for taking time to come for your Medicare Wellness Visit. I appreciate your ongoing commitment to your health goals. Please review the following plan we discussed and let me know if I can assist you in the future.   Referrals/Orders/Follow-Ups/Clinician Recommendations: Remember to call [facility], [phone], to schedule your Bone Density screening. Your are due for Flu vaccine soon and you are due Shingrix vaccine.  It was nice talking with you today.  Keep up the good work.  This is a list of the screening recommended for you and due dates:  Health Maintenance  Topic Date Due   Zoster (Shingles) Vaccine (1 of 2) Never done   COVID-19 Vaccine (3 - 2023-24 season) 06/24/2022   Flu Shot  01/22/2024*   DEXA scan (bone density measurement)  06/30/2023   Medicare Annual Wellness Visit  06/22/2024   Colon Cancer Screening  10/01/2024   Mammogram  10/27/2024   DTaP/Tdap/Td vaccine (2 - Td or Tdap) 05/08/2028   Pneumonia Vaccine  Completed   Hepatitis C Screening  Completed   HPV Vaccine  Aged Out  *Topic was postponed. The date shown is not the original due date.    Advanced directives: (Copy Requested) Please bring a copy of your health care power of attorney and living will to the office to be added to your chart at your convenience.  Next Medicare Annual Wellness Visit scheduled for next year: Yes

## 2023-06-29 ENCOUNTER — Other Ambulatory Visit: Payer: Self-pay | Admitting: Orthopedic Surgery

## 2023-07-04 DIAGNOSIS — M1712 Unilateral primary osteoarthritis, left knee: Secondary | ICD-10-CM | POA: Diagnosis not present

## 2023-07-05 NOTE — Progress Notes (Addendum)
COVID Vaccine Completed:  Yes  Date of COVID positive in last 90 days:  No  PCP - Cheryll Cockayne, MD Cardiologist - Donato Schultz, MD  Cardiac clearance in Epic dated 06-06-23 by Robin Searing, NP  Chest x-ray - 07-06-23 Epic (waiting on final read) EKG - 06-08-23 Epic Stress Test - Yes, several years ago ECHO - N/A Cardiac Cath - N/A Pacemaker/ICD device last checked: Spinal Cord Stimulator: N/A Coronary CT - 02-13-20 Epic  Bowel Prep - N/A  Sleep Study - N/A CPAP -   Fasting Blood Sugar - N/A Checks Blood Sugar _____ times a day  Last dose of GLP1 agonist-  N/A GLP1 instructions:  N/A   Last dose of SGLT-2 inhibitors-  N/A SGLT-2 instructions: N/A   Blood Thinner Instructions: N/A Aspirin Instructions: Last Dose:  Activity level:  Can go up a flight of stairs and perform activities of daily living without stopping and without symptoms of chest pain or shortness of breath.  Anesthesia review:  Nonobstructive CAD, hx of chest pain.  Patient states no recent episodes of chest pain  Patient denies shortness of breath, fever, cough and chest pain at PAT appointment  Patient verbalized understanding of instructions that were given to them at the PAT appointment. Patient was also instructed that they will need to review over the PAT instructions again at home before surgery.

## 2023-07-05 NOTE — Patient Instructions (Signed)
SURGICAL WAITING ROOM VISITATION Patients having surgery or a procedure may have no more than 2 support people in the waiting area - these visitors may rotate.    Children under the age of 58 must have an adult with them who is not the patient.  If the patient needs to stay at the hospital during part of their recovery, the visitor guidelines for inpatient rooms apply. Pre-op nurse will coordinate an appropriate time for 1 support person to accompany patient in pre-op.  This support person may not rotate.    Please refer to the [facility] website for the visitor guidelines for Inpatients (after your surgery is over and you are in a regular room).       Your procedure is scheduled on: 07-17-23   Report to [facility] Main Entrance    Report to admitting at 7:10 AM   Call this number if you have problems the morning of surgery [phone]   Do not eat food :After Midnight.   After Midnight you may have the following liquids until 6:30 AM DAY OF SURGERY  Water Non-Citrus Juices (without pulp, NO RED-Apple, White grape, White cranberry) Black Coffee (NO MILK/CREAM OR CREAMERS, sugar ok)  Clear Tea (NO MILK/CREAM OR CREAMERS, sugar ok) regular and decaf                             Plain Jell-O (NO RED)                                           Fruit ices (not with fruit pulp, NO RED)                                     Popsicles (NO RED)                                                               Sports drinks like Gatorade (NO RED)                   The day of surgery:  Drink ONE (1) Pre-Surgery Clear Ensure by 6:30 AM the morning of surgery. Drink in one sitting. Do not sip.  This drink was given to you during your hospital  pre-op appointment visit. Nothing else to drink after completing the Pre-Surgery Clear Ensure.          If you have questions, please contact your surgeon's office.   FOLLOW  ANY ADDITIONAL PRE OP INSTRUCTIONS YOU RECEIVED FROM YOUR SURGEON'S  OFFICE!!!     Oral Hygiene is also important to reduce your risk of infection.                                    Remember - BRUSH YOUR TEETH THE MORNING OF SURGERY WITH YOUR REGULAR TOOTHPASTE   Do NOT smoke after Midnight   Take these medicines the morning of surgery with A SIP OF WATER:   Rosuvastatin  Stop all vitamins  and herbal supplements 7 days before surgery                              You may not have any metal on your body including hair pins, jewelry, and body piercing             Do not wear make-up, lotions, powders, perfumes or deodorant  Do not wear nail polish including gel and S&S, artificial/acrylic nails, or any other type of covering on natural nails including finger and toenails. If you have artificial nails, gel coating, etc. that needs to be removed by a nail salon please have this removed prior to surgery or surgery may need to be canceled/ delayed if the surgeon/ anesthesia feels like they are unable to be safely monitored.   Do not shave  48 hours prior to surgery.       Do not bring valuables to the hospital. [facility] IS NOT RESPONSIBLE   FOR VALUABLES.   Contacts, dentures or bridgework may not be worn into surgery.   Bring small overnight bag day of surgery.   DO NOT BRING YOUR HOME MEDICATIONS TO THE HOSPITAL. PHARMACY WILL DISPENSE MEDICATIONS LISTED ON YOUR MEDICATION LIST TO YOU DURING YOUR ADMISSION IN THE HOSPITAL!   Special Instructions: Bring a copy of your healthcare power of attorney and living will documents the day of surgery if you haven't scanned them before.              Please read over the following fact sheets you were given: IF YOU HAVE QUESTIONS ABOUT YOUR PRE-OP INSTRUCTIONS PLEASE CALL [phone] Gwen  If you received a COVID test during your pre-op visit  it is requested that you wear a mask when out in public, stay away from anyone that may not be feeling well and notify your surgeon if you develop symptoms. If you test  positive for Covid or have been in contact with anyone that has tested positive in the last 10 days please notify you surgeon.    Pre-operative 5 CHG Bath Instructions   You can play a key role in reducing the risk of infection after surgery. Your skin needs to be as free of germs as possible. You can reduce the number of germs on your skin by washing with CHG (chlorhexidine gluconate) soap before surgery. CHG is an antiseptic soap that kills germs and continues to kill germs even after washing.   DO NOT use if you have an allergy to chlorhexidine/CHG or antibacterial soaps. If your skin becomes reddened or irritated, stop using the CHG and notify one of our RNs at [phone].   Please shower with the CHG soap starting 4 days before surgery using the following schedule:     Please keep in mind the following:  DO NOT shave, including legs and underarms, starting the day of your first shower.   You may shave your face at any point before/day of surgery.  Place clean sheets on your bed the day you start using CHG soap. Use a clean washcloth (not used since being washed) for each shower. DO NOT sleep with pets once you start using the CHG.   CHG Shower Instructions:  If you choose to wash your hair and private area, wash first with your normal shampoo/soap.  After you use shampoo/soap, rinse your hair and body thoroughly to remove shampoo/soap residue.  Turn the water OFF and apply about 3 tablespoons (45  ml) of CHG soap to a CLEAN washcloth.  Apply CHG soap ONLY FROM YOUR NECK DOWN TO YOUR TOES (washing for 3-5 minutes)  DO NOT use CHG soap on face, private areas, open wounds, or sores.  Pay special attention to the area where your surgery is being performed.  If you are having back surgery, having someone wash your back for you may be helpful. Wait 2 minutes after CHG soap is applied, then you may rinse off the CHG soap.  Pat dry with a clean towel  Put on clean clothes/pajamas   If  you choose to wear lotion, please use ONLY the CHG-compatible lotions on the back of this paper.     Additional instructions for the day of surgery: DO NOT APPLY any lotions, deodorants, cologne, or perfumes.   Put on clean/comfortable clothes.  Brush your teeth.  Ask your nurse before applying any prescription medications to the skin.      CHG Compatible Lotions   Aveeno Moisturizing lotion  Cetaphil Moisturizing Cream  Cetaphil Moisturizing Lotion  Clairol Herbal Essence Moisturizing Lotion, Dry Skin  Clairol Herbal Essence Moisturizing Lotion, Extra Dry Skin  Clairol Herbal Essence Moisturizing Lotion, Normal Skin  Curel Age Defying Therapeutic Moisturizing Lotion with Alpha Hydroxy  Curel Extreme Care Body Lotion  Curel Soothing Hands Moisturizing Hand Lotion  Curel Therapeutic Moisturizing Cream, Fragrance-Free  Curel Therapeutic Moisturizing Lotion, Fragrance-Free  Curel Therapeutic Moisturizing Lotion, Original Formula  Eucerin Daily Replenishing Lotion  Eucerin Dry Skin Therapy Plus Alpha Hydroxy Crme  Eucerin Dry Skin Therapy Plus Alpha Hydroxy Lotion  Eucerin Original Crme  Eucerin Original Lotion  Eucerin Plus Crme Eucerin Plus Lotion  Eucerin TriLipid Replenishing Lotion  Keri Anti-Bacterial Hand Lotion  Keri Deep Conditioning Original Lotion Dry Skin Formula Softly Scented  Keri Deep Conditioning Original Lotion, Fragrance Free Sensitive Skin Formula  Keri Lotion Fast Absorbing Fragrance Free Sensitive Skin Formula  Keri Lotion Fast Absorbing Softly Scented Dry Skin Formula  Keri Original Lotion  Keri Skin Renewal Lotion Keri Silky Smooth Lotion  Keri Silky Smooth Sensitive Skin Lotion  Nivea Body Creamy Conditioning Oil  Nivea Body Extra Enriched Lotion  Nivea Body Original Lotion  Nivea Body Sheer Moisturizing Lotion Nivea Crme  Nivea Skin Firming Lotion  NutraDerm 30 Skin Lotion  NutraDerm Skin Lotion  NutraDerm Therapeutic Skin Cream  NutraDerm  Therapeutic Skin Lotion  ProShield Protective Hand Cream  Provon moisturizing lotion   PATIENT SIGNATURE_________________________________  NURSE SIGNATURE__________________________________  ________________________________________________________________________    Rogelia Mire  An incentive spirometer is a tool that can help keep your lungs clear and active. This tool measures how well you are filling your lungs with each breath. Taking long deep breaths may help reverse or decrease the chance of developing breathing (pulmonary) problems (especially infection) following: A long period of time when you are unable to move or be active. BEFORE THE PROCEDURE  If the spirometer includes an indicator to show your best effort, your nurse or respiratory therapist will set it to a desired goal. If possible, sit up straight or lean slightly forward. Try not to slouch. Hold the incentive spirometer in an upright position. INSTRUCTIONS FOR USE  Sit on the edge of your bed if possible, or sit up as far as you can in bed or on a chair. Hold the incentive spirometer in an upright position. Breathe out normally. Place the mouthpiece in your mouth and seal your lips tightly around it. Breathe in slowly and as deeply as possible, raising  the piston or the ball toward the top of the column. Hold your breath for 3-5 seconds or for as long as possible. Allow the piston or ball to fall to the bottom of the column. Remove the mouthpiece from your mouth and breathe out normally. Rest for a few seconds and repeat Steps 1 through 7 at least 10 times every 1-2 hours when you are awake. Take your time and take a few normal breaths between deep breaths. The spirometer may include an indicator to show your best effort. Use the indicator as a goal to work toward during each repetition. After each set of 10 deep breaths, practice coughing to be sure your lungs are clear. If you have an incision (the cut made  at the time of surgery), support your incision when coughing by placing a pillow or rolled up towels firmly against it. Once you are able to get out of bed, walk around indoors and cough well. You may stop using the incentive spirometer when instructed by your caregiver.  RISKS AND COMPLICATIONS Take your time so you do not get dizzy or light-headed. If you are in pain, you may need to take or ask for pain medication before doing incentive spirometry. It is harder to take a deep breath if you are having pain. AFTER USE Rest and breathe slowly and easily. It can be helpful to keep track of a log of your progress. Your caregiver can provide you with a simple table to help with this. If you are using the spirometer at home, follow these instructions: SEEK MEDICAL CARE IF:  You are having difficultly using the spirometer. You have trouble using the spirometer as often as instructed. Your pain medication is not giving enough relief while using the spirometer. You develop fever of 100.5 F (38.1 C) or higher. SEEK IMMEDIATE MEDICAL CARE IF:  You cough up bloody sputum that had not been present before. You develop fever of 102 F (38.9 C) or greater. You develop worsening pain at or near the incision site. MAKE SURE YOU:  Understand these instructions. Will watch your condition. Will get help right away if you are not doing well or get worse. Document Released: 02/20/2007 Document Revised: 01/02/2012 Document Reviewed: 04/23/2007 [facility] Patient Information 2014 [place], [place].   ________________________________________________________________________

## 2023-07-06 ENCOUNTER — Encounter (HOSPITAL_COMMUNITY)
Admission: RE | Admit: 2023-07-06 | Discharge: 2023-07-06 | Disposition: A | Discharge status: Home / Self Care | Payer: Medicare Other | Source: Ambulatory Visit | Attending: Orthopedic Surgery | Admitting: Orthopedic Surgery

## 2023-07-06 ENCOUNTER — Encounter (HOSPITAL_COMMUNITY): Payer: Self-pay

## 2023-07-06 ENCOUNTER — Ambulatory Visit (HOSPITAL_COMMUNITY)
Admission: RE | Admit: 2023-07-06 | Discharge: 2023-07-06 | Disposition: A | Discharge status: Home / Self Care | Payer: Medicare Other | Source: Ambulatory Visit | Attending: Orthopedic Surgery | Admitting: Orthopedic Surgery

## 2023-07-06 ENCOUNTER — Other Ambulatory Visit: Payer: Self-pay

## 2023-07-06 VITALS — BP 134/89 | HR 64 | Temp 98.4°F | Resp 20 | Ht 64.0 in | Wt 166.6 lb

## 2023-07-06 DIAGNOSIS — Z01811 Encounter for preprocedural respiratory examination: Secondary | ICD-10-CM | POA: Insufficient documentation

## 2023-07-06 DIAGNOSIS — M25869 Other specified joint disorders, unspecified knee: Secondary | ICD-10-CM | POA: Diagnosis not present

## 2023-07-06 DIAGNOSIS — Z01812 Encounter for preprocedural laboratory examination: Secondary | ICD-10-CM | POA: Diagnosis not present

## 2023-07-06 DIAGNOSIS — Z01818 Encounter for other preprocedural examination: Secondary | ICD-10-CM

## 2023-07-06 DIAGNOSIS — I2510 Atherosclerotic heart disease of native coronary artery without angina pectoris: Secondary | ICD-10-CM | POA: Diagnosis not present

## 2023-07-06 LAB — BASIC METABOLIC PANEL
Anion gap: 8 (ref 5–15)
BUN: 12 mg/dL (ref 8–23)
CO2: 26 mmol/L (ref 22–32)
Calcium: 9.1 mg/dL (ref 8.9–10.3)
Chloride: 103 mmol/L (ref 98–111)
Creatinine, Ser: 0.74 mg/dL (ref 0.44–1.00)
GFR, Estimated: >60 mL/min (ref >60)
Glucose, Bld: 111 mg/dL — ABNORMAL HIGH (ref 70–99)
Potassium: 4.3 mmol/L (ref 3.5–5.1)
Sodium: 137 mmol/L (ref 135–145)

## 2023-07-06 LAB — CBC
HCT: 42.2 % (ref 36.0–46.0)
Hemoglobin: 14.2 g/dL (ref 12.0–15.0)
MCH: 30.7 pg (ref 26.0–34.0)
MCHC: 33.6 g/dL (ref 30.0–36.0)
MCV: 91.3 fL (ref 80.0–100.0)
Platelets: 248 10*3/uL (ref 150–400)
RBC: 4.62 MIL/uL (ref 3.87–5.11)
RDW: 13.4 % (ref 11.5–15.5)
WBC: 3.5 10*3/uL — ABNORMAL LOW (ref 4.0–10.5)
nRBC: 0 % (ref 0.0–0.2)

## 2023-07-06 LAB — SURGICAL PCR SCREEN
MRSA, PCR: NEGATIVE
Staphylococcus aureus: NEGATIVE

## 2023-07-10 NOTE — Progress Notes (Signed)
Anesthesia Chart Review   Case: [id number] Date/Time: 07/17/23 0926   Procedure: TOTAL KNEE ARTHROPLASTY (Left: Knee)   Anesthesia type: Spinal   Pre-op diagnosis: LEFT KNEE OSTEOARTHRITIS   Location: [facility] ROOM 08 / [facility]   Surgeons: Jodi Geralds, MD       DISCUSSION:72 y.o. former smoker with h/o PONV, nonobstructive CAD, left knee OA scheduled for above procedure 07/17/23 with Dr. Jodi Geralds.   Pt last seen by cardiology 06/08/2023. Per OV note, "The patient affirms she has been doing well without any new cardiac symptoms. They are able to achieve 7 METS without cardiac limitations. Therefore, based on ACC/AHA guidelines, the patient would be at acceptable risk for the planned procedure without further cardiovascular testing. The patient was advised that if she develops new symptoms prior to surgery to contact our office to arrange for a follow-up visit, and she verbalized understanding."  VS: BP 134/89   Pulse 64   Temp 36.9 C (Oral)   Resp 20   Ht 5\' 4"  (1.626 m)   Wt 75.6 kg   SpO2 100%   BMI 28.60 kg/m   PROVIDERS: Pincus Sanes, MD is PCP   Primary Cardiologist:  Donato Schultz, MD  LABS: Labs reviewed: Acceptable for surgery. (all labs ordered are listed, but only abnormal results are displayed)  Labs Reviewed  BASIC METABOLIC PANEL - Abnormal; Notable for the following components:      Result Value   Glucose, Bld 111 (*)    All other components within normal limits  CBC - Abnormal; Notable for the following components:   WBC 3.5 (*)    All other components within normal limits  SURGICAL PCR SCREEN     IMAGES:   EKG:   CV:  Past Medical History:  Diagnosis Date   Acute pain of left shoulder 07/03/2019   Arthritis of right knee 07/24/2019   Atypical chest pain 11/14/2018   Chronic pain of right knee 05/22/2019   CTS (carpal tunnel syndrome)    chronic   Depression 01/16/2013   Family history of colon cancer 06/06/2014   Headache 06/28/2019   History of colon  polyps    Hyperglycemia 05/08/2018   Hyperlipidemia 04/27/2017   Left arm numbness 11/21/2017   Left wrist pain 07/03/2019   Osteopenia    Personal history of colonic polyps 06/06/2014   PONV (postoperative nausea and vomiting)    Pyelonephritis 05/02/2017   Stress at home 11/14/2018   stress w/dtr who has ovarian cancer 1/20   Tubular adenoma    UTI (urinary tract infection)    Vertigo 04/17/2018    Past Surgical History:  Procedure Laterality Date   ABDOMINAL HYSTERECTOMY  1994   fibroids   APPENDECTOMY     BUNIONECTOMY Right    BUNIONECTOMY Left 2018   CHOLECYSTECTOMY     COLONOSCOPY  12/09/2008   Dr.Rourk- normal rectum, polyp at the hepatic flexure bx= tubular adenoma. the remainder of the colonic mucosa and terminal ileum mucosa appeared normal.   COLONOSCOPY  2006   Dr.Sam Bellefonte- colonic adenoma   COLONOSCOPY N/A 06/25/2014   Procedure: COLONOSCOPY;  Surgeon: Corbin Ade, MD;  Location: [facility];  Service: Endoscopy;  Laterality: N/A;  1030 DR REQUEST TIME   HEMORRHOID SURGERY     KNEE ARTHROSCOPY Right    torn meniscus   KNEE ARTHROSCOPY Left    ORIF TOE FRACTURE Right    rotator Cuff surgery     TUBAL LIGATION  MEDICATIONS:  ALPRAZolam (XANAX) 0.25 MG tablet   Ascorbic Acid (VITAMIN C) 500 MG CHEW   b complex vitamins capsule   Calcium Carbonate-Vitamin D 600-400 MG-UNIT tablet   Cholecalciferol (VITAMIN D3) 50 MCG (2000 UT) TABS   COD LIVER OIL PO   conjugated estrogens (PREMARIN) vaginal cream   Ginkgo Biloba 60 MG CAPS   Misc Natural Products (TART CHERRY ADVANCED) CAPS   Multiple Vitamins-Minerals (ONE-A-DAY WOMENS 50 PLUS PO)   pyridOXINE (VITAMIN B-6) 100 MG tablet   Rhubarb (ESTROVEN COMPLETE PO)   rosuvastatin (CRESTOR) 20 MG tablet   Turmeric (QC TUMERIC COMPLEX PO)   vitamin B-12 (CYANOCOBALAMIN) 500 MCG tablet   No current facility-administered medications for this encounter.      Jodell Cipro Ward, PA-C [facility] Pre-Surgical  Testing [phone]

## 2023-07-14 NOTE — Progress Notes (Signed)
Called and updated on a surgery change- Aware to be here for surgery at 1045 for a 1315 surgery time. ( This is a 3rd time change) she is aware and RN reviewed all pre op instructions.

## 2023-07-17 ENCOUNTER — Ambulatory Visit (HOSPITAL_COMMUNITY): Payer: Medicare Other | Admitting: Physician Assistant

## 2023-07-17 ENCOUNTER — Encounter (HOSPITAL_COMMUNITY)
Admission: RE | Disposition: A | Discharge status: Home Health | Payer: Self-pay | Source: Ambulatory Visit | Attending: Orthopedic Surgery

## 2023-07-17 ENCOUNTER — Observation Stay (HOSPITAL_COMMUNITY)
Admission: RE | Admit: 2023-07-17 | Discharge: 2023-07-18 | Disposition: A | Discharge status: Home Health | Payer: Medicare Other | Source: Ambulatory Visit | Attending: Orthopedic Surgery | Admitting: Orthopedic Surgery

## 2023-07-17 ENCOUNTER — Ambulatory Visit (HOSPITAL_COMMUNITY): Payer: Medicare Other | Admitting: Anesthesiology

## 2023-07-17 ENCOUNTER — Other Ambulatory Visit: Payer: Self-pay

## 2023-07-17 ENCOUNTER — Encounter (HOSPITAL_COMMUNITY): Payer: Self-pay | Admitting: Orthopedic Surgery

## 2023-07-17 DIAGNOSIS — M1712 Unilateral primary osteoarthritis, left knee: Secondary | ICD-10-CM | POA: Diagnosis not present

## 2023-07-17 DIAGNOSIS — I739 Peripheral vascular disease, unspecified: Secondary | ICD-10-CM

## 2023-07-17 DIAGNOSIS — Z87891 Personal history of nicotine dependence: Secondary | ICD-10-CM | POA: Diagnosis not present

## 2023-07-17 DIAGNOSIS — I700 Atherosclerosis of aorta: Secondary | ICD-10-CM | POA: Diagnosis not present

## 2023-07-17 DIAGNOSIS — G8918 Other acute postprocedural pain: Secondary | ICD-10-CM | POA: Diagnosis not present

## 2023-07-17 DIAGNOSIS — Z96652 Presence of left artificial knee joint: Secondary | ICD-10-CM | POA: Diagnosis not present

## 2023-07-17 DIAGNOSIS — F418 Other specified anxiety disorders: Secondary | ICD-10-CM | POA: Diagnosis not present

## 2023-07-17 HISTORY — PX: TOTAL KNEE ARTHROPLASTY: SHX125

## 2023-07-17 SURGERY — ARTHROPLASTY, KNEE, TOTAL
Anesthesia: Spinal | Site: Knee | Laterality: Left

## 2023-07-17 MED ORDER — PROPOFOL 500 MG/50ML IV EMUL
INTRAVENOUS | Status: DC | PRN
  Administered 2023-07-17: 20 mg via INTRAVENOUS
  Administered 2023-07-17: 75 ug/kg/min via INTRAVENOUS

## 2023-07-17 MED ORDER — DOCUSATE SODIUM 100 MG PO CAPS
100.0000 mg | ORAL_CAPSULE | Freq: Two times a day (BID) | ORAL | Status: DC
  Administered 2023-07-17 – 2023-07-18 (×2): 100 mg via ORAL
  Filled 2023-07-17 (×2): qty 1

## 2023-07-17 MED ORDER — ACETAMINOPHEN 500 MG PO TABS
1000.0000 mg | ORAL_TABLET | Freq: Four times a day (QID) | ORAL | Status: AC
  Administered 2023-07-17 – 2023-07-18 (×4): 1000 mg via ORAL
  Filled 2023-07-17 (×4): qty 2

## 2023-07-17 MED ORDER — HYDROMORPHONE HCL 1 MG/ML IJ SOLN: 0.2500 mg | INTRAMUSCULAR | Status: DC | PRN

## 2023-07-17 MED ORDER — ACETAMINOPHEN 10 MG/ML IV SOLN
INTRAVENOUS | Status: DC | PRN
  Administered 2023-07-17: 1000 mg via INTRAVENOUS

## 2023-07-17 MED ORDER — CELECOXIB 200 MG PO CAPS: 200.0000 mg | ORAL_CAPSULE | Freq: Two times a day (BID) | ORAL | 0 refills | Status: DC

## 2023-07-17 MED ORDER — GLYCOPYRROLATE 0.2 MG/ML IJ SOLN
INTRAMUSCULAR | Status: DC | PRN
  Administered 2023-07-17: .2 mg via INTRAVENOUS

## 2023-07-17 MED ORDER — TIZANIDINE HCL 2 MG PO TABS: 2.0000 mg | ORAL_TABLET | Freq: Three times a day (TID) | ORAL | 0 refills | Status: DC | PRN

## 2023-07-17 MED ORDER — FENTANYL CITRATE (PF) 100 MCG/2ML IJ SOLN
INTRAMUSCULAR | Status: AC
  Filled 2023-07-17: qty 2

## 2023-07-17 MED ORDER — OXYCODONE-ACETAMINOPHEN 5-325 MG PO TABS: 1.0000 | ORAL_TABLET | Freq: Four times a day (QID) | ORAL | 0 refills | Status: DC | PRN

## 2023-07-17 MED ORDER — OXYCODONE HCL 5 MG PO TABS
5.0000 mg | ORAL_TABLET | ORAL | Status: DC | PRN
  Administered 2023-07-17 – 2023-07-18 (×5): 5 mg via ORAL
  Filled 2023-07-17 (×5): qty 1

## 2023-07-17 MED ORDER — FENTANYL CITRATE (PF) 100 MCG/2ML IJ SOLN
INTRAMUSCULAR | Status: DC | PRN
  Administered 2023-07-17: 50 ug via INTRAVENOUS

## 2023-07-17 MED ORDER — PHENYLEPHRINE HCL-NACL 20-0.9 MG/250ML-% IV SOLN
INTRAVENOUS | Status: DC | PRN
Start: 2023-07-17 — End: 2023-07-17
  Administered 2023-07-17: 40 ug/min via INTRAVENOUS

## 2023-07-17 MED ORDER — CHLORHEXIDINE GLUCONATE 0.12 % MT SOLN
15.0000 mL | Freq: Once | OROMUCOSAL | Status: AC
  Administered 2023-07-17: 15 mL via OROMUCOSAL

## 2023-07-17 MED ORDER — DOCUSATE SODIUM 100 MG PO CAPS: 100.0000 mg | ORAL_CAPSULE | Freq: Two times a day (BID) | ORAL | 0 refills | Status: AC

## 2023-07-17 MED ORDER — MIDAZOLAM HCL 2 MG/2ML IJ SOLN: 2.0000 mg | Freq: Once | INTRAMUSCULAR | Status: DC

## 2023-07-17 MED ORDER — ORAL CARE MOUTH RINSE: 15.0000 mL | Freq: Once | OROMUCOSAL | Status: AC

## 2023-07-17 MED ORDER — TRANEXAMIC ACID-NACL 1000-0.7 MG/100ML-% IV SOLN
1000.0000 mg | INTRAVENOUS | Status: AC
  Administered 2023-07-17: 1000 mg via INTRAVENOUS

## 2023-07-17 MED ORDER — SODIUM CHLORIDE 0.9 % IR SOLN
Status: DC | PRN
  Administered 2023-07-17: 1000 mL

## 2023-07-17 MED ORDER — PHENYLEPHRINE HCL (PRESSORS) 10 MG/ML IV SOLN
INTRAVENOUS | Status: DC | PRN
  Administered 2023-07-17 (×2): 160 ug via INTRAVENOUS

## 2023-07-17 MED ORDER — ONDANSETRON HCL 4 MG PO TABS: 4.0000 mg | ORAL_TABLET | Freq: Four times a day (QID) | ORAL | Status: DC | PRN

## 2023-07-17 MED ORDER — METHOCARBAMOL 1000 MG/10ML IJ SOLN: 500.0000 mg | Freq: Four times a day (QID) | INTRAVENOUS | Status: DC | PRN

## 2023-07-17 MED ORDER — WATER FOR IRRIGATION, STERILE IR SOLN
Status: DC | PRN
  Administered 2023-07-17: 2000 mL

## 2023-07-17 MED ORDER — DIPHENHYDRAMINE HCL 12.5 MG/5ML PO ELIX: 12.5000 mg | ORAL_SOLUTION | ORAL | Status: DC | PRN

## 2023-07-17 MED ORDER — ASPIRIN 325 MG PO TBEC
325.0000 mg | DELAYED_RELEASE_TABLET | Freq: Two times a day (BID) | ORAL | Status: DC
  Administered 2023-07-17 – 2023-07-18 (×3): 325 mg via ORAL
  Filled 2023-07-17 (×3): qty 1

## 2023-07-17 MED ORDER — BUPIVACAINE-EPINEPHRINE 0.5% -1:200000 IJ SOLN
INTRAMUSCULAR | Status: DC | PRN
  Administered 2023-07-17: 30 mL

## 2023-07-17 MED ORDER — DROPERIDOL 2.5 MG/ML IJ SOLN: 0.6250 mg | Freq: Once | INTRAMUSCULAR | Status: DC | PRN

## 2023-07-17 MED ORDER — ONDANSETRON HCL 4 MG/2ML IJ SOLN
INTRAMUSCULAR | Status: DC | PRN
  Administered 2023-07-17: 4 mg via INTRAVENOUS

## 2023-07-17 MED ORDER — BUPIVACAINE IN DEXTROSE 0.75-8.25 % IT SOLN
INTRATHECAL | Status: DC | PRN
  Administered 2023-07-17: 1.6 mL via INTRATHECAL

## 2023-07-17 MED ORDER — HYDROMORPHONE HCL 1 MG/ML IJ SOLN: 0.5000 mg | INTRAMUSCULAR | Status: DC | PRN

## 2023-07-17 MED ORDER — TRANEXAMIC ACID-NACL 1000-0.7 MG/100ML-% IV SOLN
1000.0000 mg | Freq: Once | INTRAVENOUS | Status: AC
  Administered 2023-07-17: 1000 mg via INTRAVENOUS
  Filled 2023-07-17: qty 100

## 2023-07-17 MED ORDER — 0.9 % SODIUM CHLORIDE (POUR BTL) OPTIME
TOPICAL | Status: DC | PRN
  Administered 2023-07-17: 1000 mL

## 2023-07-17 MED ORDER — DEXAMETHASONE SODIUM PHOSPHATE 4 MG/ML IJ SOLN
INTRAMUSCULAR | Status: DC | PRN
  Administered 2023-07-17 (×2): 10 mg via PERINEURAL

## 2023-07-17 MED ORDER — MAGNESIUM CITRATE PO SOLN: 1.0000 | Freq: Once | ORAL | Status: DC | PRN

## 2023-07-17 MED ORDER — PROPOFOL 1000 MG/100ML IV EMUL
INTRAVENOUS | Status: AC
  Filled 2023-07-17: qty 100

## 2023-07-17 MED ORDER — POLYETHYLENE GLYCOL 3350 17 G PO PACK: 17.0000 g | PACK | Freq: Every day | ORAL | Status: DC | PRN

## 2023-07-17 MED ORDER — METHOCARBAMOL 500 MG PO TABS
500.0000 mg | ORAL_TABLET | Freq: Four times a day (QID) | ORAL | Status: DC | PRN
  Administered 2023-07-17 – 2023-07-18 (×2): 500 mg via ORAL
  Filled 2023-07-17 (×2): qty 1

## 2023-07-17 MED ORDER — GLYCOPYRROLATE 0.2 MG/ML IJ SOLN
INTRAMUSCULAR | Status: AC
  Filled 2023-07-17: qty 1

## 2023-07-17 MED ORDER — CEFAZOLIN SODIUM-DEXTROSE 2-4 GM/100ML-% IV SOLN
2.0000 g | Freq: Four times a day (QID) | INTRAVENOUS | Status: AC
  Administered 2023-07-17 – 2023-07-18 (×2): 2 g via INTRAVENOUS
  Filled 2023-07-17 (×2): qty 100

## 2023-07-17 MED ORDER — LACTATED RINGERS IV SOLN: INTRAVENOUS | Status: DC

## 2023-07-17 MED ORDER — DEXAMETHASONE SODIUM PHOSPHATE 10 MG/ML IJ SOLN
INTRAMUSCULAR | Status: AC
  Filled 2023-07-17: qty 1

## 2023-07-17 MED ORDER — POVIDONE-IODINE 10 % EX SWAB: 2.0000 | Freq: Once | CUTANEOUS | Status: DC

## 2023-07-17 MED ORDER — BISACODYL 5 MG PO TBEC: 5.0000 mg | DELAYED_RELEASE_TABLET | Freq: Every day | ORAL | Status: DC | PRN

## 2023-07-17 MED ORDER — BUPIVACAINE LIPOSOME 1.3 % IJ SUSP
INTRAMUSCULAR | Status: DC | PRN
  Administered 2023-07-17: 20 mL

## 2023-07-17 MED ORDER — BUPIVACAINE LIPOSOME 1.3 % IJ SUSP: 20.0000 mL | Freq: Once | INTRAMUSCULAR | Status: DC

## 2023-07-17 MED ORDER — ACETAMINOPHEN 500 MG PO TABS: 1000.0000 mg | ORAL_TABLET | Freq: Once | ORAL | Status: DC

## 2023-07-17 MED ORDER — FENTANYL CITRATE PF 50 MCG/ML IJ SOSY: 100.0000 ug | PREFILLED_SYRINGE | Freq: Once | INTRAMUSCULAR | Status: DC

## 2023-07-17 MED ORDER — CLONIDINE HCL (ANALGESIA) 100 MCG/ML EP SOLN
EPIDURAL | Status: DC | PRN
  Administered 2023-07-17: 80 ug

## 2023-07-17 MED ORDER — MIDAZOLAM HCL 2 MG/2ML IJ SOLN
INTRAMUSCULAR | Status: AC
  Filled 2023-07-17: qty 2

## 2023-07-17 MED ORDER — LIDOCAINE HCL (PF) 2 % IJ SOLN
INTRAMUSCULAR | Status: AC
  Filled 2023-07-17: qty 5

## 2023-07-17 MED ORDER — ALUM & MAG HYDROXIDE-SIMETH 200-200-20 MG/5ML PO SUSP: 30.0000 mL | ORAL | Status: DC | PRN

## 2023-07-17 MED ORDER — SODIUM CHLORIDE 0.9 % IV SOLN: INTRAVENOUS | Status: DC

## 2023-07-17 MED ORDER — LIDOCAINE HCL (CARDIAC) PF 100 MG/5ML IV SOSY
PREFILLED_SYRINGE | INTRAVENOUS | Status: DC | PRN
Start: 2023-07-17 — End: 2023-07-17
  Administered 2023-07-17: 40 mg via INTRATRACHEAL

## 2023-07-17 MED ORDER — SODIUM CHLORIDE 0.9% FLUSH
INTRAVENOUS | Status: DC | PRN
  Administered 2023-07-17: 50 mL

## 2023-07-17 MED ORDER — ONDANSETRON HCL 4 MG/2ML IJ SOLN
INTRAMUSCULAR | Status: AC
  Filled 2023-07-17: qty 2

## 2023-07-17 MED ORDER — ACETAMINOPHEN 325 MG PO TABS: 325.0000 mg | ORAL_TABLET | Freq: Four times a day (QID) | ORAL | Status: DC | PRN

## 2023-07-17 MED ORDER — LACTATED RINGERS IV SOLN: INTRAVENOUS | Status: DC | PRN

## 2023-07-17 MED ORDER — PROPOFOL 10 MG/ML IV BOLUS
INTRAVENOUS | Status: AC
  Filled 2023-07-17: qty 20

## 2023-07-17 MED ORDER — SODIUM CHLORIDE (PF) 0.9 % IJ SOLN
INTRAMUSCULAR | Status: AC
  Filled 2023-07-17: qty 50

## 2023-07-17 MED ORDER — DEXAMETHASONE SODIUM PHOSPHATE 4 MG/ML IJ SOLN
INTRAMUSCULAR | Status: DC | PRN
Start: 2023-07-17 — End: 2023-07-17
  Administered 2023-07-17: 10 mg via INTRAVENOUS

## 2023-07-17 MED ORDER — ONDANSETRON HCL 4 MG/2ML IJ SOLN
4.0000 mg | Freq: Four times a day (QID) | INTRAMUSCULAR | Status: DC | PRN
  Administered 2023-07-17 – 2023-07-18 (×4): 4 mg via INTRAVENOUS
  Filled 2023-07-17 (×4): qty 2

## 2023-07-17 MED ORDER — ROPIVACAINE HCL 5 MG/ML IJ SOLN
INTRAMUSCULAR | Status: DC | PRN
  Administered 2023-07-17: 30 mL via PERINEURAL

## 2023-07-17 MED ORDER — BUPIVACAINE LIPOSOME 1.3 % IJ SUSP
INTRAMUSCULAR | Status: AC
  Filled 2023-07-17: qty 20

## 2023-07-17 MED ORDER — DEXAMETHASONE SODIUM PHOSPHATE 10 MG/ML IJ SOLN
10.0000 mg | Freq: Two times a day (BID) | INTRAMUSCULAR | Status: AC
  Administered 2023-07-17 – 2023-07-18 (×2): 10 mg via INTRAVENOUS
  Filled 2023-07-17 (×2): qty 1

## 2023-07-17 MED ORDER — MIDAZOLAM HCL 5 MG/5ML IJ SOLN
INTRAMUSCULAR | Status: DC | PRN
  Administered 2023-07-17: 2 mg via INTRAVENOUS

## 2023-07-17 MED ORDER — CEFAZOLIN SODIUM-DEXTROSE 2-4 GM/100ML-% IV SOLN
2.0000 g | INTRAVENOUS | Status: AC
  Administered 2023-07-17: 2 g via INTRAVENOUS
  Filled 2023-07-17: qty 100

## 2023-07-17 MED ORDER — ACETAMINOPHEN 10 MG/ML IV SOLN
INTRAVENOUS | Status: AC
  Filled 2023-07-17: qty 100

## 2023-07-17 MED ORDER — ASPIRIN 81 MG PO TBEC: 81.0000 mg | DELAYED_RELEASE_TABLET | Freq: Two times a day (BID) | ORAL | 0 refills | Status: AC

## 2023-07-17 MED ORDER — BUPIVACAINE-EPINEPHRINE (PF) 0.5% -1:200000 IJ SOLN
INTRAMUSCULAR | Status: AC
  Filled 2023-07-17: qty 30

## 2023-07-17 SURGICAL SUPPLY — 61 items
APL SKNCLS STERI-STRIP NONHPOA (GAUZE/BANDAGES/DRESSINGS) ×1
ATTUNE PSFEM LTSZ5 NARCEM KNEE (Femur) IMPLANT
ATTUNE PSRP INSR SZ5 5 KNEE (Insert) IMPLANT
BAG COUNTER SPONGE SURGICOUNT (BAG) IMPLANT
BAG SPEC THK2 15X12 ZIP CLS (MISCELLANEOUS) ×1
BAG SPNG CNTER NS LX DISP (BAG)
BAG ZIPLOCK 12X15 (MISCELLANEOUS) ×1 IMPLANT
BASEPLATE TIBIAL ROTATING SZ 4 (Knees) IMPLANT
BENZOIN TINCTURE PRP APPL 2/3 (GAUZE/BANDAGES/DRESSINGS) ×1 IMPLANT
BLADE SAGITTAL 25.0X1.19X90 (BLADE) ×1 IMPLANT
BLADE SAW SGTL 13.0X1.19X90.0M (BLADE) ×1 IMPLANT
BLADE SURG SZ10 CARB STEEL (BLADE) ×2 IMPLANT
BNDG CMPR 6 X 5 YARDS HK CLSR (GAUZE/BANDAGES/DRESSINGS) ×1
BNDG CMPR MED 10X6 ELC LF (GAUZE/BANDAGES/DRESSINGS) ×1
BNDG ELASTIC 6INX 5YD STR LF (GAUZE/BANDAGES/DRESSINGS) ×1 IMPLANT
BNDG ELASTIC 6X10 VLCR STRL LF (GAUZE/BANDAGES/DRESSINGS) IMPLANT
BOOTIES KNEE HIGH SLOAN (MISCELLANEOUS) ×1 IMPLANT
BOWL SMART MIX CTS (DISPOSABLE) ×1 IMPLANT
BSPLAT TIB 4 CMNT ROT PLAT STR (Knees) ×1 IMPLANT
CEMENT HV SMART SET (Cement) ×2 IMPLANT
CLSR STERI-STRIP ANTIMIC 1/2X4 (GAUZE/BANDAGES/DRESSINGS) IMPLANT
COVER SURGICAL LIGHT HANDLE (MISCELLANEOUS) ×1 IMPLANT
CUFF TOURN SGL QUICK 34 (TOURNIQUET CUFF) ×1
CUFF TRNQT CYL 34X4.125X (TOURNIQUET CUFF) ×1 IMPLANT
DRAPE INCISE IOBAN 66X45 STRL (DRAPES) ×1 IMPLANT
DRAPE U-SHAPE 47X51 STRL (DRAPES) ×1 IMPLANT
DRSG AQUACEL AG ADV 3.5X10 (GAUZE/BANDAGES/DRESSINGS) ×1 IMPLANT
DURAPREP 26ML APPLICATOR (WOUND CARE) ×1 IMPLANT
ELECT REM PT RETURN 15FT ADLT (MISCELLANEOUS) ×1 IMPLANT
GLOVE BIOGEL PI IND STRL 8 (GLOVE) ×2 IMPLANT
GLOVE ECLIPSE 7.5 STRL STRAW (GLOVE) ×2 IMPLANT
GOWN STRL REUS W/ TWL XL LVL3 (GOWN DISPOSABLE) ×2 IMPLANT
GOWN STRL REUS W/TWL XL LVL3 (GOWN DISPOSABLE) ×2
HANDPIECE INTERPULSE COAX TIP (DISPOSABLE) ×1
HOLDER FOLEY CATH W/STRAP (MISCELLANEOUS) IMPLANT
HOOD PEEL AWAY T7 (MISCELLANEOUS) ×3 IMPLANT
KIT TURNOVER KIT A (KITS) IMPLANT
MANIFOLD NEPTUNE II (INSTRUMENTS) ×1 IMPLANT
NDL HYPO 22X1.5 SAFETY MO (MISCELLANEOUS) ×2 IMPLANT
NEEDLE HYPO 22X1.5 SAFETY MO (MISCELLANEOUS) ×2 IMPLANT
NS IRRIG 1000ML POUR BTL (IV SOLUTION) ×1 IMPLANT
PACK TOTAL KNEE CUSTOM (KITS) ×1 IMPLANT
PADDING CAST COTTON 6X4 STRL (CAST SUPPLIES) ×1 IMPLANT
PATELLA MEDIAL ATTUN 35MM KNEE (Knees) IMPLANT
PIN STEINMAN FIXATION KNEE (PIN) IMPLANT
PROTECTOR NERVE ULNAR (MISCELLANEOUS) ×1 IMPLANT
SET HNDPC FAN SPRY TIP SCT (DISPOSABLE) ×1 IMPLANT
SPIKE FLUID TRANSFER (MISCELLANEOUS) ×2 IMPLANT
STRIP CLOSURE SKIN 1/2X4 (GAUZE/BANDAGES/DRESSINGS) IMPLANT
SUT MNCRL AB 3-0 PS2 18 (SUTURE) ×1 IMPLANT
SUT VIC AB 0 CT1 36 (SUTURE) ×1 IMPLANT
SUT VIC AB 1 CT1 36 (SUTURE) ×2 IMPLANT
SUT VIC AB 2-0 CT1 27 (SUTURE) ×1
SUT VIC AB 2-0 CT1 TAPERPNT 27 (SUTURE) ×1 IMPLANT
SYR CONTROL 10ML LL (SYRINGE) ×2 IMPLANT
TRAY CATH INTERMITTENT SS 16FR (CATHETERS) IMPLANT
TRAY FOLEY MTR SLVR 14FR STAT (SET/KITS/TRAYS/PACK) IMPLANT
TRAY FOLEY W/BAG SLVR 14FR LF (SET/KITS/TRAYS/PACK) IMPLANT
TUBE SUCTION HIGH CAP CLEAR NV (SUCTIONS) ×1 IMPLANT
WATER STERILE IRR 1000ML POUR (IV SOLUTION) ×2 IMPLANT
WRAP KNEE MAXI GEL POST OP (GAUZE/BANDAGES/DRESSINGS) ×1 IMPLANT

## 2023-07-17 NOTE — Progress Notes (Signed)
Orthopedic Tech Progress Note Patient Details:  Lori [name] August 11, 1951 [id number]  CPM Left Knee CPM Left Knee: On Left Knee Flexion (Degrees): 90 Left Knee Extension (Degrees): 0  Post Interventions Patient Tolerated: Well  Darleen Crocker 07/17/2023, 4:08 PM

## 2023-07-17 NOTE — Anesthesia Procedure Notes (Signed)
Procedure Name: MAC Date/Time: 07/17/2023 1:25 PM  Performed by: Lily Lovings, CRNAPre-anesthesia Checklist: Patient identified, Emergency Drugs available, Suction available and Patient being monitored Patient Re-evaluated:Patient Re-evaluated prior to induction Oxygen Delivery Method: Simple face mask Preoxygenation: Pre-oxygenation with 100% oxygen Induction Type: IV induction

## 2023-07-17 NOTE — Anesthesia Procedure Notes (Signed)
Anesthesia Regional Block: Adductor canal block   Pre-Anesthetic Checklist: , timeout performed,  Correct Patient, Correct Site, Correct Laterality,  Correct Procedure, Correct Position, site marked,  Risks and benefits discussed,  Surgical consent,  Pre-op evaluation,  At surgeon's request and post-op pain management  Laterality: Lower and Left  Prep: chloraprep       Needles:  Injection technique: Single-shot  Needle Type: Stimiplex     Needle Length: 9cm  Needle Gauge: 21     Additional Needles:   Procedures:,,,, ultrasound used (permanent image in chart),,    Narrative:  Start time: 07/17/2023 12:57 PM End time: 07/17/2023 1:17 PM Injection made incrementally with aspirations every 5 mL.  Performed by: Personally  Anesthesiologist: Lewie Loron, MD  Additional Notes: BP cuff, EKG monitors applied. Sedation begun. Artery and nerve location verified with ultrasound. Anesthetic injected incrementally (5ml), slowly, and after negative aspirations under direct u/s guidance. Good fascial/perineural spread. Tolerated well.

## 2023-07-17 NOTE — Op Note (Signed)
PATIENT ID:      Lori [name]  MRN:     [id number] DOB/AGE:    11/27/1950 / 72 y.o.       OPERATIVE REPORT   DATE OF PROCEDURE:  07/17/2023      PREOPERATIVE DIAGNOSIS:   LEFT KNEE OSTEOARTHRITIS      Estimated body mass index is 28.6 kg/m as calculated from the following:   Height as of this encounter: 5\' 4"  (1.626 m).   Weight as of this encounter: 75.6 kg.                                                       POSTOPERATIVE DIAGNOSIS:   Same                                                                  PROCEDURE:  Procedure(s): TOTAL KNEE ARTHROPLASTY Using DepuyAttune RP implants #5N Femur, #4Tibia, 5 mm Attune RP bearing, 35 Patella    SURGEON: Harvie Junior  ASSISTANT:  Jacquelynn Cree   (Present and scrubbed throughout the case, critical for assistance with exposure, retraction, instrumentation, and closure.)        ANESTHESIA: spinal, 20cc Exparel, 50cc 0.25% Marcaine EBL: min cc FLUID REPLACEMENT: unk cc crystaloid TOURNIQUET: DRAINS: None TRANEXAMIC ACID: 1gm IV, 2gm topical COMPLICATIONS:  None         INDICATIONS FOR PROCEDURE: The patient has  LEFT KNEE OSTEOARTHRITIS, mild varus deformities, XR shows bone on bone arthritis, lateral subluxation of tibia. Patient has failed all conservative measures including anti-inflammatory medicines, narcotics, attempts at exercise and weight loss, cortisone injections and viscosupplementation.  Risks and benefits of surgery have been discussed, questions answered.   DESCRIPTION OF PROCEDURE: The patient identified by armband, received  IV antibiotics, in the holding area at [facility]. Patient taken to the operating room, appropriate anesthetic monitors were attached, and spinal anesthesia was  induced. IV Tranexamic acid was given. Lateral post and 2 surefoot positioners applied to the table, the lower extremity was then prepped and draped in usual sterile fashion from the toes to the high thigh. Time-out procedure  was performed. Gus Puma PAC, was present and scrubbed throughout the case, critical for assistance with, positioning, exposure, retraction, instrumentation, and closure.The skin and subcutaneous tissue along the incision was injected with 20 cc of a mixture of 20cc Exparel and 30cc Marcaine 50cc saline solution, using a 21-gauge by 1-1/2 inch needle. We began the operation, with the knee flexed 130 degrees, by making the anterior midline incision starting at handbreadth above the patella going over the patella 1 cm medial to and 4 cm distal to the tibial tubercle. Small bleeders in the skin and the subcutaneous tissue identified and cauterized. Transverse retinaculum was incised and reflected medially and a medial parapatellar arthrotomy was accomplished. the patella was everted and theprepatellar fat pad resected. The superficial medial collateral ligament was then elevated from anterior to posterior along the proximal flare of the tibia and anterior half of the menisci resected. The knee was hyperflexed exposing bone on bone arthritis. Peripheral and notch osteophytes  as well as the cruciate ligaments were then resected. We continued to work our way around posteriorly along the proximal tibia, and externally rotated the tibia subluxing it out from underneath the femur. A McHale PCL retractor was placed through the notch, a lateral Hohmann retractor, and anterolateral small homan retractor placed. We then entered the proximal tibia with the Depuy starter drill in line with the axis of the tibia followed by an intramedullary guide rod and 3-degree posterior slope cutting guide. The tibial cutting guide, was pinned into place allowing resection of 2 mm of bone medially and 10 mm of bone laterally. Satisfied with the tibial resection, we then entered the distal femur 2 mm anterior to the PCL origin with the starter drill, followed by the intramedullary guide rod and applied the distal femoral cutting guide set at 9  mm, with 5 degrees of valgus. This was pinned along the epicondylar axis. At this point, the distal femoral cut was accomplished without difficulty. We then sized for a #5N femoral component and pinned the chamfer guide in 3 degrees of external rotation. The anterior, posterior, and chamfer cuts were accomplished without difficulty followed by the Attune RP box cutting guide and the box cut. We also removed posterior osteophytes from the posterior femoral condyles. The posterior capsule was injected with Exparel solution. The knee was brought into full extension. We checked our extension gap and fit a 5 mm trial lollipop. Distracting in extension with a lamina spreader,  bleeders in the posterior capsule, Posterior medial and posterior lateral gutter were cauterized.  The transexamic acid-soaked sponge was then placed in the gap of the knee in extension. The knee was flexed 30. The posterior patella cut was accomplished with the 9.5 mm Attune cutting guide, sized for a 35 mm dome, and the fixation pegs drilled.The knee was then once again hyperflexed exposing the proximal tibia. We sized for a # 4 tibial base plate, applied the smokestack and the conical reamer followed by the the Delta fin keel punch. We then hammered into place the Attune RP trial femoral component, drilled the lugs, inserted a  5 mm trial bearing, trial patellar button, and took the knee through range of motion from 0-130 degrees. Medial and lateral ligamentous stability was checked. No thumb pressure was required for patellar Tracking.  All trial components were removed, mating surfaces irrigated with pulse lavage, and dried with suction and sponges. 10 cc of the Exparel solution was applied to the cancellus bone of the patella distal femur and proximal tibia.  After waiting 30 seconds, the bony surfaces were again, dried with sponges. A double batch of DePuy HV cement was mixed and applied to all bony metallic mating surfaces except for the  posterior condyles of the femur itself. In order, we hammered into place the tibial tray and removed excess cement, the femoral component and removed excess cement. The final Attune RP bearing was inserted, and the knee brought to full extension with compression. The patellar button was clamped into place, and excess cement removed. The knee was held at 30 flexion with compression using the second surefoot, while the cement cured. The wound was irrigated out with normal saline solution pulse lavage. The rest of the Exparel was injected into the parapatellar arthrotomy, subcutaneous tissues, and periosteal tissues. The parapatellar arthrotomy was closed with running #1 Vicryl suture. The subcutaneous tissue with 3-0 undyed Vicryl suture, and the skin with running 3-0 SQ vicryl. An Aquacil dressing and Ace wrap were applied. The  patient was taken to recovery room without difficulty.   Harvie Junior 07/17/2023, 4:08 PM

## 2023-07-17 NOTE — Discharge Instructions (Signed)

## 2023-07-17 NOTE — Transfer of Care (Signed)
Immediate Anesthesia Transfer of Care Note  Patient: Lori [name]  Procedure(s) Performed: TOTAL KNEE ARTHROPLASTY (Left: Knee)  Patient Location: PACU  Anesthesia Type:Spinal  Level of Consciousness: drowsy and patient cooperative  Airway & Oxygen Therapy: Patient Spontanous Breathing and Patient connected to face mask oxygen  Post-op Assessment: Report given to RN and Post -op Vital signs reviewed and stable  Post vital signs: Reviewed and stable  Last Vitals:  Vitals Value Taken Time  BP 102/63 07/17/23 1541  Temp    Pulse 87 07/17/23 1544  Resp 18 07/17/23 1544  SpO2 100 % 07/17/23 1544  Vitals shown include unfiled device data.  Last Pain:  Vitals:   07/17/23 1058  TempSrc: Oral         Complications: No notable events documented.

## 2023-07-17 NOTE — Plan of Care (Signed)
Problem: Education: Goal: Knowledge of the prescribed therapeutic regimen will improve Outcome: Progressing   Problem: Activity: Goal: Ability to avoid complications of mobility impairment will improve Outcome: Progressing   Problem: Clinical Measurements: Goal: Postoperative complications will be avoided or minimized Outcome: Progressing   Problem: Pain Management: Goal: Pain level will decrease with appropriate interventions Outcome: Progressing   Haydee Salter, RN 07/17/23 8:21 PM

## 2023-07-17 NOTE — Anesthesia Preprocedure Evaluation (Addendum)
Anesthesia Evaluation  Patient identified by MRN, date of birth, ID band Patient awake    Reviewed: Allergy & Precautions, NPO status , Patient's Chart, lab work & pertinent test results  History of Anesthesia Complications (+) PONV and history of anesthetic complications  Airway Mallampati: II  TM Distance: >3 FB Neck ROM: Full    Dental no notable dental hx. (+) Dental Advisory Given, Teeth Intact   Pulmonary former smoker   Pulmonary exam normal breath sounds clear to auscultation       Cardiovascular + Peripheral Vascular Disease  Normal cardiovascular exam Rhythm:Regular Rate:Normal     Neuro/Psych  Headaches PSYCHIATRIC DISORDERS Anxiety Depression     Neuromuscular disease    GI/Hepatic negative GI ROS, Neg liver ROS,,,  Endo/Other  negative endocrine ROS    Renal/GU Renal disease     Musculoskeletal  (+) Arthritis ,    Abdominal   Peds  Hematology negative hematology ROS (+)   Anesthesia Other Findings   Reproductive/Obstetrics                             Anesthesia Physical Anesthesia Plan  ASA: 3  Anesthesia Plan: Spinal   Post-op Pain Management: Tylenol PO (pre-op)* and Regional block*   Induction: Intravenous  PONV Risk Score and Plan: 4 or greater and Ondansetron, Dexamethasone and Treatment may vary due to age or medical condition  Airway Management Planned:   Additional Equipment:   Intra-op Plan:   Post-operative Plan:   Informed Consent: I have reviewed the patients History and Physical, chart, labs and discussed the procedure including the risks, benefits and alternatives for the proposed anesthesia with the patient or authorized representative who has indicated his/her understanding and acceptance.     Dental advisory given  Plan Discussed with: CRNA  Anesthesia Plan Comments:         Anesthesia Quick Evaluation

## 2023-07-17 NOTE — Anesthesia Procedure Notes (Addendum)
Spinal  Patient location during procedure: OR Start time: 07/17/2023 1:27 PM End time: 07/17/2023 1:37 PM Reason for block: surgical anesthesia Staffing Performed: anesthesiologist  Anesthesiologist: Lewie Loron, MD Performed by: Lewie Loron, MD Authorized by: Lewie Loron, MD   Preanesthetic Checklist Completed: patient identified, IV checked, site marked, risks and benefits discussed, surgical consent, monitors and equipment checked, pre-op evaluation and timeout performed Spinal Block Patient position: sitting Prep: DuraPrep and site prepped and draped Patient monitoring: heart rate, continuous pulse ox and blood pressure Approach: left paramedian Location: L3-4 Injection technique: single-shot Needle Needle type: Spinocan  Needle gauge: 25 G Needle length: 9 cm Additional Notes Expiration date of kit checked and confirmed. Patient tolerated procedure well, without complications.

## 2023-07-17 NOTE — H&P (Signed)
TOTAL KNEE ADMISSION H&P  Patient is being admitted for left total knee arthroplasty.  Subjective:  Chief Complaint:left knee pain.  HPI: Lori [name], 72 y.o. female, has a history of pain and functional disability in the left knee due to arthritis and has failed non-surgical conservative treatments for greater than 12 weeks to includeNSAID's and/or analgesics, corticosteriod injections, viscosupplementation injections, flexibility and strengthening excercises, supervised PT with diminished ADL's post treatment, and activity modification.  Onset of symptoms was gradual, starting 3 years ago with gradually worsening course since that time. The patient noted no past surgery on the left knee(s).  Patient currently rates pain in the left knee(s) at 9 out of 10 with activity. Patient has night pain, worsening of pain with activity and weight bearing, pain that interferes with activities of daily living, pain with passive range of motion, and joint swelling.  Patient has evidence of subchondral cysts, subchondral sclerosis, periarticular osteophytes, and joint subluxation by imaging studies. This patient has had  failure of all reasonable conservative care . There is no active infection.  Patient Active Problem List   Diagnosis Date Noted   Anxiety 04/13/2023   Vitamin D deficiency 04/12/2023   Leg swelling 12/06/2022   Aortic atherosclerosis (HCC) 05/31/2022   Osteoarthritis of left knee 05/18/2022   Allergic rhinitis 03/14/2022   Nonvenomous snake bite 07/16/2021   Candidiasis of breast 04/28/2021   Acute medial meniscus tear, left, initial encounter 07/30/2020   Acute tear lateral meniscus, left, initial encounter 07/30/2020   Closed subchondral insufficiency fracture of condyle of left femur (HCC) 07/30/2020   Baker's cyst of knee, left 06/04/2020   Atherosclerosis of right iliac artery 05/21/2020   Arthritis of right knee 07/24/2019   Right shoulder pain 07/03/2019   Stress at home  11/14/2018   Hyperglycemia 05/08/2018   Vertigo 04/17/2018   Bilateral arm numbness and tingling while sleeping 11/21/2017   Pyelonephritis 05/02/2017   Hyperlipidemia 04/27/2017   Personal history of colonic polyps 06/06/2014   Family history of colon cancer 06/06/2014   Osteopenia 11/04/2013   Past Medical History:  Diagnosis Date   Acute pain of left shoulder 07/03/2019   Arthritis of right knee 07/24/2019   Atypical chest pain 11/14/2018   Chronic pain of right knee 05/22/2019   CTS (carpal tunnel syndrome)    chronic   Depression 01/16/2013   Family history of colon cancer 06/06/2014   Headache 06/28/2019   History of colon polyps    Hyperglycemia 05/08/2018   Hyperlipidemia 04/27/2017   Left arm numbness 11/21/2017   Left wrist pain 07/03/2019   Osteopenia    Personal history of colonic polyps 06/06/2014   PONV (postoperative nausea and vomiting)    Pyelonephritis 05/02/2017   Stress at home 11/14/2018   stress w/dtr who has ovarian cancer 1/20   Tubular adenoma    UTI (urinary tract infection)    Vertigo 04/17/2018    Past Surgical History:  Procedure Laterality Date   ABDOMINAL HYSTERECTOMY  1994   fibroids   APPENDECTOMY     BUNIONECTOMY Right    BUNIONECTOMY Left 2018   CHOLECYSTECTOMY     COLONOSCOPY  12/09/2008   Dr.Rourk- normal rectum, polyp at the hepatic flexure bx= tubular adenoma. the remainder of the colonic mucosa and terminal ileum mucosa appeared normal.   COLONOSCOPY  2006   Dr.Sam [place]- colonic adenoma   COLONOSCOPY N/A 06/25/2014   Procedure: COLONOSCOPY;  Surgeon: Corbin Ade, MD;  Location: [facility];  Service:  Endoscopy;  Laterality: N/A;  1030 DR REQUEST TIME   HEMORRHOID SURGERY     KNEE ARTHROSCOPY Right    torn meniscus   KNEE ARTHROSCOPY Left    ORIF TOE FRACTURE Right    rotator Cuff surgery     TUBAL LIGATION      Current Facility-Administered Medications  Medication Dose Route Frequency Provider Last Rate Last Admin   bupivacaine  liposome (EXPAREL) 1.3 % injection 266 mg  20 mL Other Once Jodi Geralds, MD       ceFAZolin (ANCEF) IVPB 2g/100 mL premix  2 g Intravenous On Call to OR Jodi Geralds, MD       fentaNYL (SUBLIMAZE) injection 100 mcg  100 mcg Intravenous Once Lewie Loron, MD       lactated ringers infusion   Intravenous Continuous Mariann Barter, MD 10 mL/hr at 07/17/23 1104 New Bag at 07/17/23 1104   midazolam (VERSED) injection 2 mg  2 mg Intravenous Once Lewie Loron, MD       povidone-iodine 10 % swab 2 Application  2 Application Topical Once Jodi Geralds, MD       tranexamic acid (CYKLOKAPRON) IVPB 1,000 mg  1,000 mg Intravenous To OR Jodi Geralds, MD       Allergies  Allergen Reactions   Codeine Nausea And Vomiting   Doxycycline Nausea Only    Headache   Hycodan [Hydrocodone Bit-Homatrop Mbr] Nausea And Vomiting   Other Nausea And Vomiting    Anesthesia   Penicillins Nausea And Vomiting   Tramadol     Social History   Tobacco Use   Smoking status: Former    Current packs/day: 0.00    Average packs/day: 1 pack/day for 22.6 years (22.6 ttl pk-yrs)    Types: Cigarettes    Start date: 04/13/1971    Quit date: 11/01/1993    Years since quitting: 29.7   Smokeless tobacco: Never  Substance Use Topics   Alcohol use: Yes    Alcohol/week: 0.0 standard drinks of alcohol    Comment: wine    Family History  Problem Relation Age of Onset   Bipolar disorder Sister    Schizophrenia Sister    Kidney disease Sister    Hypertension Sister    Bipolar disorder Sister    Kidney disease Sister    Hypertension Mother    Mental illness Mother    Dementia Mother    Pancreatic cancer Mother    Cancer Father 20       colon; bladder   Hypertension Father    Heart disease Father    Arthritis Father    Prostate cancer Father    Esophageal cancer Father    Stomach cancer Father    Colon cancer Father    Kidney disease Father    Bladder Cancer Father    Ovarian cancer Daughter    Bipolar disorder  Son    Liver cancer Neg Hx      Review of Systems ROS: I have reviewed the patient's review of systems thoroughly and there are no positive responses as relates to the HPI.  Objective:  Physical Exam  Vital signs in last 24 hours: Temp:  [99 F (37.2 C)] 99 F (37.2 C) (09/23 1058) Pulse Rate:  [86] 86 (09/23 1058) Resp:  [16] 16 (09/23 1058) BP: (137)/(75) 137/75 (09/23 1058) SpO2:  [100 %] 100 % (09/23 1058) Weight:  [75.6 kg] 75.6 kg (09/23 1040) Well-developed well-nourished patient in no acute distress. Alert and oriented x3 HEENT:within  normal limits Cardiac: Regular rate and rhythm Pulmonary: Lungs clear to auscultation Abdomen: Soft and nontender.  Normal active bowel sounds  Musculoskeletal: (l knee : painfull rom limited rom no instability. Nvi distally)  Labs: Recent Results (from the past 2160 hour(s))  VITAMIN D 25 Hydroxy (Vit-D Deficiency, Fractures)     Status: None   Collection Time: 06/06/23 10:33 AM  Result Value Ref Range   VITD 44.57 30.00 - 100.00 ng/mL  Hemoglobin A1c     Status: None   Collection Time: 06/06/23 10:33 AM  Result Value Ref Range   Hgb A1c MFr Bld 5.6 4.6 - 6.5 %    Comment: Glycemic Control Guidelines for People with Diabetes:Non Diabetic:  <6%Goal of Therapy: <7%Additional Action Suggested:  >8%   TSH     Status: None   Collection Time: 06/06/23 10:33 AM  Result Value Ref Range   TSH 1.74 0.35 - 5.50 uIU/mL  Lipid panel     Status: Abnormal   Collection Time: 06/06/23 10:33 AM  Result Value Ref Range   Cholesterol 256 (H) 0 - 200 mg/dL    Comment: ATP III Classification       Desirable:  < 200 mg/dL               Borderline High:  200 - 239 mg/dL          High:  > = 161 mg/dL   Triglycerides 096.0 (H) 0.0 - 149.0 mg/dL    Comment: Normal:  <454 mg/dLBorderline High:  150 - 199 mg/dL   HDL 09.81 >19.14 mg/dL   VLDL 78.2 0.0 - 95.6 mg/dL   LDL Cholesterol 213 (H) 0 - 99 mg/dL   Total CHOL/HDL Ratio 3     Comment:                 Men          Women1/2 Average Risk     3.4          3.3Average Risk          5.0          4.42X Average Risk          9.6          7.13X Average Risk          15.0          11.0                       NonHDL 162.13     Comment: NOTE:  Non-HDL goal should be 30 mg/dL higher than patient's LDL goal (i.e. LDL goal of < 70 mg/dL, would have non-HDL goal of < 100 mg/dL)  Comprehensive metabolic panel     Status: Abnormal   Collection Time: 06/06/23 10:33 AM  Result Value Ref Range   Sodium 138 135 - 145 mEq/L   Potassium 3.4 (L) 3.5 - 5.1 mEq/L   Chloride 99 96 - 112 mEq/L   CO2 31 19 - 32 mEq/L   Glucose, Bld 94 70 - 99 mg/dL   BUN 11 6 - 23 mg/dL   Creatinine, Ser 0.86 0.40 - 1.20 mg/dL   Total Bilirubin 1.3 (H) 0.2 - 1.2 mg/dL   Alkaline Phosphatase 67 39 - 117 U/L   AST 18 0 - 37 U/L   ALT 18 0 - 35 U/L   Total Protein 7.1 6.0 - 8.3 g/dL   Albumin 4.5 3.5 - 5.2 g/dL  GFR 77.10 >60.00 mL/min    Comment: Calculated using the CKD-EPI Creatinine Equation (2021)   Calcium 9.6 8.4 - 10.5 mg/dL  CBC with Differential/Platelet     Status: Abnormal   Collection Time: 06/06/23 10:33 AM  Result Value Ref Range   WBC 5.9 4.0 - 10.5 K/uL   RBC 4.72 3.87 - 5.11 Mil/uL   Hemoglobin 14.4 12.0 - 15.0 g/dL   HCT 69.6 29.5 - 28.4 %   MCV 92.3 78.0 - 100.0 fl   MCHC 33.1 30.0 - 36.0 g/dL   RDW 13.2 44.0 - 10.2 %   Platelets 281.0 150.0 - 400.0 K/uL   Neutrophils Relative % 42.6 (L) 43.0 - 77.0 %   Lymphocytes Relative 46.5 (H) 12.0 - 46.0 %   Monocytes Relative 8.8 3.0 - 12.0 %   Eosinophils Relative 1.2 0.0 - 5.0 %   Basophils Relative 0.9 0.0 - 3.0 %   Neutro Abs 2.5 1.4 - 7.7 K/uL   Lymphs Abs 2.7 0.7 - 4.0 K/uL   Monocytes Absolute 0.5 0.1 - 1.0 K/uL   Eosinophils Absolute 0.1 0.0 - 0.7 K/uL   Basophils Absolute 0.1 0.0 - 0.1 K/uL  Surgical pcr screen     Status: None   Collection Time: 07/06/23  8:40 AM   Specimen: Nasal Mucosa; Nasal Swab  Result Value Ref Range   MRSA, PCR NEGATIVE  NEGATIVE   Staphylococcus aureus NEGATIVE NEGATIVE    Comment: (NOTE) The Xpert SA Assay (FDA approved for NASAL specimens in patients 71 years of age and older), is one component of a comprehensive surveillance program. It is not intended to diagnose infection nor to guide or monitor treatment. Performed at [facility], [address], [place], [place] [address]   Basic metabolic panel per protocol     Status: Abnormal   Collection Time: 07/06/23  9:22 AM  Result Value Ref Range   Sodium 137 135 - 145 mmol/L   Potassium 4.3 3.5 - 5.1 mmol/L   Chloride 103 98 - 111 mmol/L   CO2 26 22 - 32 mmol/L   Glucose, Bld 111 (H) 70 - 99 mg/dL    Comment: Glucose reference range applies only to samples taken after fasting for at least 8 hours.   BUN 12 8 - 23 mg/dL   Creatinine, Ser 6.44 0.44 - 1.00 mg/dL   Calcium 9.1 8.9 - 03.4 mg/dL   GFR, Estimated >74 >25 mL/min    Comment: (NOTE) Calculated using the CKD-EPI Creatinine Equation (2021)    Anion gap 8 5 - 15    Comment: Performed at [facility], [address], [place], [place] [address]  CBC per protocol     Status: Abnormal   Collection Time: 07/06/23  9:22 AM  Result Value Ref Range   WBC 3.5 (L) 4.0 - 10.5 K/uL   RBC 4.62 3.87 - 5.11 MIL/uL   Hemoglobin 14.2 12.0 - 15.0 g/dL   HCT 75.6 43.3 - 29.5 %   MCV 91.3 80.0 - 100.0 fL   MCH 30.7 26.0 - 34.0 pg   MCHC 33.6 30.0 - 36.0 g/dL   RDW 18.8 41.6 - 60.6 %   Platelets 248 150 - 400 K/uL   nRBC 0.0 0.0 - 0.2 %    Comment: Performed at [facility], [address], [place], [place] [address]     Estimated body mass index is 28.6 kg/m as calculated from the following:   Height as of  this encounter: 5\' 4"  (1.626 m).   Weight as of this encounter: 75.6 kg.   Imaging Review Plain radiographs demonstrate severe degenerative joint disease of the left knee(s). The overall alignment ismild varus. The bone quality appears  to be fair for age and reported activity level.      Assessment/Plan:  End stage arthritis, left knee   The patient history, physical examination, clinical judgment of the provider and imaging studies are consistent with end stage degenerative joint disease of the left knee(s) and total knee arthroplasty is deemed medically necessary. The treatment options including medical management, injection therapy arthroscopy and arthroplasty were discussed at length. The risks and benefits of total knee arthroplasty were presented and reviewed. The risks due to aseptic loosening, infection, stiffness, patella tracking problems, thromboembolic complications and other imponderables were discussed. The patient acknowledged the explanation, agreed to proceed with the plan and consent was signed. Patient is being admitted for inpatient treatment for surgery, pain control, PT, OT, prophylactic antibiotics, VTE prophylaxis, progressive ambulation and ADL's and discharge planning. The patient is planning to be discharged home with home health services     Patient's anticipated LOS is less than 2 midnights, meeting these requirements: - Younger than 39 - Lives within 1 hour of care - Has a competent adult at home to recover with post-op recover - NO history of  - Chronic pain requiring opiods  - Diabetes  - Coronary Artery Disease  - Heart failure  - Heart attack  - Stroke  - DVT/VTE  - Cardiac arrhythmia  - Respiratory Failure/COPD  - Renal failure  - Anemia  - Advanced Liver disease

## 2023-07-17 NOTE — Progress Notes (Signed)
Orthopedic Tech Progress Note Patient Details:  Lori [name] 06-30-1951 [id number]  Patient ID: Lori [name], female   DOB: 1951/09/13, 72 y.o.   MRN: [id number] Requested that 3W staff remove patient's CPM at 9:15 PM, so they will have reached their 4 hours for the day. No ortho tech present during time CPM needs to be removed.  Darleen Crocker 07/17/2023, 8:37 PM

## 2023-07-18 ENCOUNTER — Encounter (HOSPITAL_COMMUNITY): Payer: Self-pay | Admitting: Orthopedic Surgery

## 2023-07-18 DIAGNOSIS — Z96652 Presence of left artificial knee joint: Secondary | ICD-10-CM | POA: Diagnosis not present

## 2023-07-18 DIAGNOSIS — Z87891 Personal history of nicotine dependence: Secondary | ICD-10-CM | POA: Diagnosis not present

## 2023-07-18 DIAGNOSIS — M1712 Unilateral primary osteoarthritis, left knee: Secondary | ICD-10-CM | POA: Diagnosis not present

## 2023-07-18 NOTE — Progress Notes (Signed)
Physical Therapy Treatment Patient Details Name: Lori [name] MRN: [id number] DOB: [birth year] Today's Date: 07/18/2023   History of Present Illness 72 yo female adm s/p L TKA. PMH: chest pain, vertigo, ORIF toe fx, knee arthroscopy    PT Comments  Pt is progressing very well this pm, able to amb incr distance with no nausea or dizziness. Pt is meeting goals and is ready to d/c with family assist from PT standpoint.    If plan is discharge home, recommend the following: A little help with walking and/or transfers;A little help with bathing/dressing/bathroom;Help with stairs or ramp for entrance;Assistance with cooking/housework;Assist for transportation   Can travel by private vehicle        Equipment Recommendations  Rolling walker (2 wheels)    Recommendations for Other Services       Precautions / Restrictions Precautions Precautions: Fall;Knee Restrictions LLE Weight Bearing: Weight bearing as tolerated     Mobility  Bed Mobility Overal bed mobility: Needs Assistance Bed Mobility: Supine to Sit     Supine to sit: Supervision     General bed mobility comments: pt able to self assist with gait belt, cues for technique    Transfers Overall transfer level: Needs assistance Equipment used: Rolling walker (2 wheels) Transfers: Sit to/from Stand Sit to Stand: Supervision, Contact guard assist           General transfer comment: cues for hand placement    Ambulation/Gait Ambulation/Gait assistance: Supervision, Contact guard assist Gait Distance (Feet): 120 Feet Assistive device: Rolling walker (2 wheels) Gait Pattern/deviations: Step-to pattern       General Gait Details: cues for sequence and RW position. good stability, no LOB, no dizziness or nausea with incr distance   Stairs             Wheelchair Mobility     Tilt Bed    Modified Rankin (Stroke Patients Only)       Balance                                             Cognition Arousal: Alert Behavior During Therapy: WFL for tasks assessed/performed Overall Cognitive Status: Within Functional Limits for tasks assessed                                          Exercises Total Joint Exercises Ankle Circles/Pumps: AROM, Both, 10 reps Quad Sets: AROM, Both, 10 reps Heel Slides: AROM, AAROM, Left, 10 reps Straight Leg Raises: AAROM, Left, 10 reps    General Comments        Pertinent Vitals/Pain Pain Assessment Pain Assessment: Faces Faces Pain Scale: Hurts a little bit Pain Location: L knee Pain Descriptors / Indicators: Discomfort, Tightness Pain Intervention(s): Limited activity within patient's tolerance, Monitored during session, Premedicated before session, Repositioned    Home Living                          Prior Function            PT Goals (current goals can now be found in the care plan section) Acute Rehab PT Goals PT Goal Formulation: With patient Time For Goal Achievement: 07/25/23 Potential to Achieve Goals: Good Progress towards PT goals: Progressing toward goals  Frequency    7X/week      PT Plan      Co-evaluation              AM-PAC PT "6 Clicks" Mobility   Outcome Measure  Help needed turning from your back to your side while in a flat bed without using bedrails?: A Little Help needed moving from lying on your back to sitting on the side of a flat bed without using bedrails?: None Help needed moving to and from a bed to a chair (including a wheelchair)?: A Little Help needed standing up from a chair using your arms (e.g., wheelchair or bedside chair)?: A Little Help needed to walk in hospital room?: A Little Help needed climbing 3-5 steps with a railing? : A Little 6 Click Score: 19    End of Session Equipment Utilized During Treatment: Gait belt Activity Tolerance: Patient tolerated treatment well Patient left: in chair;with call bell/phone within reach;with  chair alarm set   PT Visit Diagnosis: Other abnormalities of gait and mobility (R26.89);Difficulty in walking, not elsewhere classified (R26.2)     Time: 9629-5284 PT Time Calculation (min) (ACUTE ONLY): 28 min  Charges:    $Gait Training: 8-22 mins $Therapeutic Exercise: 8-22 mins PT General Charges $$ ACUTE PT VISIT: 1 Visit                     Deola Rewis, PT  Acute Rehab Dept [facility]) [phone]  07/18/2023    [facility] 07/18/2023, 4:04 PM

## 2023-07-18 NOTE — Progress Notes (Signed)
Subjective: 1 Day Post-Op Procedure(s) (LRB): TOTAL KNEE ARTHROPLASTY (Left) Patient reports pain as mild.  Foley catheter removed this morning.  Taking by mouth okay.  Mild nausea.  She reports that her daughter lives in [place] and she should be able to go stay with her for a few days.  Objective: Vital signs in last 24 hours: Temp:  [97.7 F (36.5 C)-99 F (37.2 C)] 97.9 F (36.6 C) (09/24 0746) Pulse Rate:  [50-99] 71 (09/24 0746) Resp:  [12-18] 16 (09/24 0746) BP: (100-137)/(49-75) 117/69 (09/24 0746) SpO2:  [94 %-100 %] 100 % (09/24 0746) Weight:  [75.6 kg] 75.6 kg (09/23 1040)  Intake/Output from previous day: 09/23 0701 - 09/24 0700 In: 8119 [P.O.:837; I.V.:2456; IV Piggyback:600] Out: 3500 [Urine:3450; Blood:50] Intake/Output this shift: No intake/output data recorded.  No results for input(s): "HGB" in the last 72 hours. No results for input(s): "WBC", "RBC", "HCT", "PLT" in the last 72 hours. No results for input(s): "NA", "K", "CL", "CO2", "BUN", "CREATININE", "GLUCOSE", "CALCIUM" in the last 72 hours. No results for input(s): "LABPT", "INR" in the last 72 hours. Left knee exam: Neurovascular intact Sensation intact distally Intact pulses distally Dorsiflexion/Plantar flexion intact Incision: dressing C/D/I No cellulitis present Compartment soft   Assessment/Plan: 1 Day Post-Op Procedure(s) (LRB): TOTAL KNEE ARTHROPLASTY (Left) Plan: Up with therapy today.  Weight-bear as tolerated on left lower extremity. Discharge home with home health physical therapy. She should be able to go stay with her daughter. Once discharged she will take aspirin 81 mg twice daily x 1 month postop for DVT prophylaxis. Discharge home after physical therapy if passes PT. Follow-up with Dr. Luiz Blare in 2 weeks   Anticipated LOS equal to or greater than 2 midnights due to - Age 70 and older with one or more of the following:  - Obesity  - Expected need for hospital services  (PT, OT, Nursing) required for safe  discharge  - Anticipated need for postoperative skilled nursing care or inpatient rehab  - Active co-morbidities: None OR   - Unanticipated findings during/Post Surgery: None  - Patient is a high risk of re-admission due to: Barriers to post-acute care (logistical, no family support in home)   Matthew Folks 07/18/2023, 8:21 AM

## 2023-07-18 NOTE — Evaluation (Signed)
Physical Therapy Evaluation Patient Details Name: Lori [name] MRN: [id number] DOB: 1950-12-03 Today's Date: 07/18/2023  History of Present Illness  72 yo female adm s/p L TKA. PMH: chest pain, vertigo, ORIF toe fx, knee arthroscopy  Clinical Impression  Pt is s/p TKA resulting in the deficits listed below (see PT Problem List).  Pt mobilizing well initially, became diaphoretic after amb 50'. RN made aware. Will see again this pm.if symptoms resolve pt will likely be abel to d/c after pm PT session. Pt family is very supportive.   Pt will benefit from acute skilled PT to increase their independence and safety with mobility to allow discharge.          If plan is discharge home, recommend the following: A little help with walking and/or transfers;A little help with bathing/dressing/bathroom;Help with stairs or ramp for entrance;Assistance with cooking/housework;Assist for transportation   Can travel by private vehicle        Equipment Recommendations Rolling walker (2 wheels)  Recommendations for Other Services       Functional Status Assessment Patient has had a recent decline in their functional status and demonstrates the ability to make significant improvements in function in a reasonable and predictable amount of time.     Precautions / Restrictions Precautions Precautions: Fall Restrictions Weight Bearing Restrictions: No LLE Weight Bearing: Weight bearing as tolerated      Mobility  Bed Mobility Overal bed mobility: Needs Assistance Bed Mobility: Supine to Sit     Supine to sit: Min assist     General bed mobility comments: assist with LL E    Transfers Overall transfer level: Needs assistance Equipment used: Rolling walker (2 wheels) Transfers: Sit to/from Stand Sit to Stand: Contact guard assist, Min assist           General transfer comment: cues for hand placement    Ambulation/Gait Ambulation/Gait assistance: Contact guard assist Gait  Distance (Feet): 50 Feet Assistive device: Rolling walker (2 wheels) Gait Pattern/deviations: Step-to pattern       General Gait Details: cues for sequence and RW position, after above distance pt with mild diaphoresis, c/o nausea. recliner to pt with pt reporting improvement of symptoms once reclined  Stairs            Wheelchair Mobility     Tilt Bed    Modified Rankin (Stroke Patients Only)       Balance                                             Pertinent Vitals/Pain Pain Assessment Pain Assessment: No/denies pain    Home Living Family/patient expects to be discharged to:: Private residence Living Arrangements: Alone   Type of Home: House Home Access: Stairs to enter   Secretary/administrator of Steps: 1- threshold   Home Layout: One level Home Equipment: None      Prior Function Prior Level of Function : Independent/Modified Independent                     Extremity/Trunk Assessment   Upper Extremity Assessment Upper Extremity Assessment: Overall WFL for tasks assessed    Lower Extremity Assessment Lower Extremity Assessment: LLE deficits/detail LLE Deficits / Details: ankle WFL, knee extension and hip flexion 2+/5, limited by post op weakness       Communication      Cognition  Arousal: Alert Behavior During Therapy: WFL for tasks assessed/performed Overall Cognitive Status: Within Functional Limits for tasks assessed                                          General Comments      Exercises Total Joint Exercises Ankle Circles/Pumps: AROM, Both, 10 reps   Assessment/Plan    PT Assessment Patient needs continued PT services  PT Problem List Decreased strength;Decreased range of motion;Decreased activity tolerance;Decreased mobility;Decreased knowledge of precautions;Pain;Decreased knowledge of use of DME       PT Treatment Interventions DME instruction;Gait training;Functional mobility  training;Therapeutic activities;Therapeutic exercise;Patient/family education    PT Goals (Current goals can be found in the Care Plan section)  Acute Rehab PT Goals PT Goal Formulation: With patient Time For Goal Achievement: 07/25/23 Potential to Achieve Goals: Good    Frequency 7X/week     Co-evaluation               AM-PAC PT "6 Clicks" Mobility  Outcome Measure Help needed turning from your back to your side while in a flat bed without using bedrails?: A Little Help needed moving from lying on your back to sitting on the side of a flat bed without using bedrails?: A Little Help needed moving to and from a bed to a chair (including a wheelchair)?: A Little Help needed standing up from a chair using your arms (e.g., wheelchair or bedside chair)?: A Little Help needed to walk in hospital room?: A Little Help needed climbing 3-5 steps with a railing? : A Little 6 Click Score: 18    End of Session Equipment Utilized During Treatment: Gait belt Activity Tolerance: Patient tolerated treatment well Patient left: with call bell/phone within reach;in chair;with chair alarm set;with family/visitor present   PT Visit Diagnosis: Other abnormalities of gait and mobility (R26.89);Difficulty in walking, not elsewhere classified (R26.2)    Time: 1610-9604 PT Time Calculation (min) (ACUTE ONLY): 19 min   Charges:   PT Evaluation $PT Eval Low Complexity: 1 Low   PT General Charges $$ ACUTE PT VISIT: 1 Visit         Kirstyn Lean, PT  Acute Rehab Dept [facility]) [phone]  07/18/2023   [facility] 07/18/2023, 10:10 AM

## 2023-07-18 NOTE — TOC Transition Note (Signed)
Transition of Care [facility]) - CM/SW Discharge Note   Patient Details  Name: Lori [name] MRN: [id number] Date of Birth: 01-28-1951  [facility] of [facility]) CM/SW Contact:  Amada Jupiter, LCSW Phone Number: 07/18/2023, 10:31 AM   Clinical Narrative:     Met with pt and family today and they report need for RW and 3n1 commode.  No DME agency preference and pt aware 3n1 commode is private payment item - agreeable.  Order placed for both items with [employer] for delivery to room.  HHPT prearranged with [facility] - info on AVS.  No further TOC needs.  Final next level of care: Home w Home Health Services Barriers to Discharge: No Barriers Identified   Patient Goals and CMS Choice      Discharge Placement                         Discharge Plan and Services Additional resources added to the After Visit Summary for                  DME Arranged: 3-N-1, Walker rolling DME Agency: [employer] Date DME Agency Contacted: 07/18/23 Time DME Agency Contacted: [phone] Representative spoke with at DME Agency: Loraine Leriche HH Arranged: PT HH Agency: [facility]        Social Determinants of Health (SDOH) Interventions SDOH Screenings   Food Insecurity: No Food Insecurity (07/17/2023)  Housing: Low Risk  (07/17/2023)  Transportation Needs: No Transportation Needs (07/17/2023)  Utilities: Not At Risk (07/17/2023)  Alcohol Screen: Low Risk  (06/23/2023)  Depression (PHQ2-9): Low Risk  (06/23/2023)  Financial Resource Strain: Low Risk  (06/23/2023)  Physical Activity: Sufficiently Active (06/23/2023)  Social Connections: Moderately Isolated (06/23/2023)  Stress: No Stress Concern Present (06/23/2023)  Tobacco Use: Medium Risk (07/17/2023)  Health Literacy: Adequate Health Literacy (06/23/2023)     Readmission Risk Interventions     No data to display

## 2023-07-18 NOTE — Discharge Summary (Signed)
Patient ID: Lori [name] MRN: [id number] DOB/AGE: Feb 26, 1951 72 y.o.  Admit date: 07/17/2023 Discharge date: 07/18/2023  Admission Diagnoses:  Principal Problem:   Primary osteoarthritis of left knee   Discharge Diagnoses:  Same  Past Medical History:  Diagnosis Date   Acute pain of left shoulder 07/03/2019   Arthritis of right knee 07/24/2019   Atypical chest pain 11/14/2018   Chronic pain of right knee 05/22/2019   CTS (carpal tunnel syndrome)    chronic   Depression 01/16/2013   Family history of colon cancer 06/06/2014   Headache 06/28/2019   History of colon polyps    Hyperglycemia 05/08/2018   Hyperlipidemia 04/27/2017   Left arm numbness 11/21/2017   Left wrist pain 07/03/2019   Osteopenia    Personal history of colonic polyps 06/06/2014   PONV (postoperative nausea and vomiting)    Pyelonephritis 05/02/2017   Stress at home 11/14/2018   stress w/dtr who has ovarian cancer 1/20   Tubular adenoma    UTI (urinary tract infection)    Vertigo 04/17/2018    Surgeries: Procedure(s): Left TOTAL KNEE ARTHROPLASTY on 07/17/2023   Consultants:   Discharged Condition: Improved  Hospital Course: Lori [name] is an 72 y.o. female who was admitted 07/17/2023 for operative treatment ofPrimary osteoarthritis of left knee. Patient has severe unremitting pain that affects sleep, daily activities, and work/hobbies. After pre-op clearance the patient was taken to the operating room on 07/17/2023 and underwent  Procedure(s): Left TOTAL KNEE ARTHROPLASTY.    Patient was given perioperative antibiotics:  Anti-infectives (From admission, onward)    Start     Dose/Rate Route Frequency Ordered Stop   07/17/23 1930  ceFAZolin (ANCEF) IVPB 2g/100 mL premix        2 g 200 mL/hr over 30 Minutes Intravenous Every 6 hours 07/17/23 1741 07/18/23 0241   07/17/23 1045  ceFAZolin (ANCEF) IVPB 2g/100 mL premix        2 g 200 mL/hr over 30 Minutes Intravenous On call to O.R. 07/17/23 1037 07/17/23 1359         Patient was given sequential compression devices, early ambulation, and chemoprophylaxis to prevent DVT.  Patient benefited maximally from hospital stay and there were no complications.    Recent vital signs: Patient Vitals for the past 24 hrs:  BP Temp Temp src Pulse Resp SpO2 Height Weight  07/18/23 0746 117/69 97.9 F (36.6 C) Oral 71 16 100 % -- --  07/18/23 0555 128/70 98.3 F (36.8 C) Oral 84 15 96 % -- --  07/18/23 0125 120/66 98.5 F (36.9 C) Oral 73 15 97 % -- --  07/17/23 2011 131/75 97.7 F (36.5 C) Oral 99 14 98 % -- --  07/17/23 1755 132/75 -- -- -- 18 94 % -- --  07/17/23 1730 (!) 100/59 97.8 F (36.6 C) -- (!) 52 12 97 % -- --  07/17/23 1715 (!) 101/57 -- -- (!) 50 12 97 % -- --  07/17/23 1700 115/62 -- -- (!) 58 13 97 % -- --  07/17/23 1645 115/71 -- -- 73 14 97 % -- --  07/17/23 1630 (!) 111/49 97.7 F (36.5 C) -- 71 13 97 % -- --  07/17/23 1600 114/62 -- -- 79 12 97 % -- --  07/17/23 1545 112/62 97.7 F (36.5 C) -- 87 18 100 % -- --  07/17/23 1541 102/63 -- -- 92 14 100 % -- --  07/17/23 1058 137/75 99 F (37.2 C) Oral 86 16 100 % -- --  07/17/23 1040 -- -- -- -- -- -- 5\' 4"  (1.626 m) 75.6 kg     Recent laboratory studies: No results for input(s): "WBC", "HGB", "HCT", "PLT", "NA", "K", "CL", "CO2", "BUN", "CREATININE", "GLUCOSE", "INR", "CALCIUM" in the last 72 hours.  Invalid input(s): "PT", "2"   Discharge Medications:   Allergies as of 07/18/2023       Reactions   Codeine Nausea And Vomiting   Doxycycline Nausea Only   Headache   Hycodan [hydrocodone Bit-homatrop Mbr] Nausea And Vomiting   Other Nausea And Vomiting   Anesthesia   Penicillins Nausea And Vomiting   Tramadol         Medication List     TAKE these medications    ALPRAZolam 0.25 MG tablet Commonly known as: XANAX Take 1 tablet (0.25 mg total) by mouth daily as needed for anxiety.   aspirin EC 81 MG tablet Take 1 tablet (81 mg total) by mouth 2 (two) times daily with  a meal. Take x 1 month post op to decrease risk of blood clots.   b complex vitamins capsule Take 1 capsule by mouth daily.   Calcium Carbonate-Vitamin D 600-400 MG-UNIT tablet Take 1 tablet by mouth daily.   celecoxib 200 MG capsule Commonly known as: CeleBREX Take 1 capsule (200 mg total) by mouth 2 (two) times daily.   COD LIVER OIL PO Take 1 capsule by mouth daily.   conjugated estrogens 0.625 MG/GM vaginal cream Commonly known as: PREMARIN Place 1 Applicatorful vaginally daily as needed (irritation).   docusate sodium 100 MG capsule Commonly known as: Colace Take 1 capsule (100 mg total) by mouth 2 (two) times daily.   ESTROVEN COMPLETE PO Take 1 tablet by mouth daily.   Ginkgo Biloba 60 MG Caps Take 60 mg by mouth daily.   ONE-A-DAY WOMENS 50 PLUS PO Take 1 tablet by mouth daily.   oxyCODONE-acetaminophen 5-325 MG tablet Commonly known as: PERCOCET/ROXICET Take 1-2 tablets by mouth every 6 (six) hours as needed for severe pain.   pyridOXINE 100 MG tablet Commonly known as: VITAMIN B6 Take 100 mg by mouth daily.   QC TUMERIC COMPLEX PO Take 1 tablet by mouth daily.   rosuvastatin 20 MG tablet Commonly known as: CRESTOR TAKE 1 TABLET(20 MG) BY MOUTH DAILY   Tart Cherry Advanced Caps Take 1 capsule by mouth daily.   tiZANidine 2 MG tablet Commonly known as: ZANAFLEX Take 1 tablet (2 mg total) by mouth every 8 (eight) hours as needed for muscle spasms.   vitamin B-12 500 MCG tablet Commonly known as: CYANOCOBALAMIN Take 500 mcg by mouth daily.   Vitamin C 500 MG Chew Chew 500 mg by mouth daily.   Vitamin D3 50 MCG (2000 UT) Tabs Take 2,000 Units by mouth daily.               Durable Medical Equipment  (From admission, onward)           Start     Ordered   07/17/23 1742  DME Walker rolling  Once       Question:  Patient needs a walker to treat with the following condition  Answer:  Primary osteoarthritis of left knee   07/17/23 1741    07/17/23 1742  DME 3 n 1  Once        07/17/23 1741              Discharge Care Instructions  (From admission, onward)  Start     Ordered   07/18/23 0000  Weight bearing as tolerated       Question Answer Comment  Laterality left   Extremity Lower      07/18/23 0826            Diagnostic Studies: DG Chest 2 View  Result Date: 07/15/2023 CLINICAL DATA:  Preop for knee arthroplasty. EXAM: CHEST - 2 VIEW COMPARISON:  Chest radiograph 12/25/2021 FINDINGS: The cardiomediastinal contours are normal. The lungs are clear. Pulmonary vasculature is normal. No consolidation, pleural effusion, or pneumothorax. No acute osseous abnormalities are seen. IMPRESSION: No active cardiopulmonary disease. Electronically Signed   By: Narda Rutherford M.D.   On: 07/15/2023 13:52    Disposition: Discharge disposition: 01-Home or Self Care       Discharge Instructions     Call MD / Call 911   Complete by: As directed    If you experience chest pain or shortness of breath, CALL 911 and be transported to the hospital emergency room.  If you develope a fever above 101 F, pus (white drainage) or increased drainage or redness at the wound, or calf pain, call your surgeon's office.   Constipation Prevention   Complete by: As directed    Drink plenty of fluids.  Prune juice may be helpful.  You may use a stool softener, such as Colace (over the counter) 100 mg twice a day.  Use MiraLax (over the counter) for constipation as needed.   Diet general   Complete by: As directed    Increase activity slowly as tolerated   Complete by: As directed    Post-operative opioid taper instructions:   Complete by: As directed    POST-OPERATIVE OPIOID TAPER INSTRUCTIONS: It is important to wean off of your opioid medication as soon as possible. If you do not need pain medication after your surgery it is ok to stop day one. Opioids include: Codeine, Hydrocodone(Norco, Vicodin),  Oxycodone(Percocet, oxycontin) and hydromorphone amongst others.  Long term and even short term use of opiods can cause: Increased pain response Dependence Constipation Depression Respiratory depression And more.  Withdrawal symptoms can include Flu like symptoms Nausea, vomiting And more Techniques to manage these symptoms Hydrate well Eat regular healthy meals Stay active Use relaxation techniques(deep breathing, meditating, yoga) Do Not substitute Alcohol to help with tapering If you have been on opioids for less than two weeks and do not have pain than it is ok to stop all together.  Plan to wean off of opioids This plan should start within one week post op of your joint replacement. Maintain the same interval or time between taking each dose and first decrease the dose.  Cut the total daily intake of opioids by one tablet each day Next start to increase the time between doses. The last dose that should be eliminated is the evening dose.      Weight bearing as tolerated   Complete by: As directed    Laterality: left   Extremity: Lower        Follow-up Information     Jodi Geralds, MD. Schedule an appointment as soon as possible for a visit in 2 week(s).   Specialty: Orthopedic Surgery Contact information: [address] [place] [address] [phone]                  Signed: Matthew Folks 07/18/2023, 8:26 AM

## 2023-07-18 NOTE — Plan of Care (Signed)

## 2023-07-18 NOTE — Care Management Obs Status (Signed)
MEDICARE OBSERVATION STATUS NOTIFICATION   Patient Details  Name: Lori [name] MRN: [id number] Date of Birth: April 04, 1951   Medicare Observation Status Notification Given:  Hart Robinsons, LCSW 07/18/2023, 3:09 PM

## 2023-07-18 NOTE — Progress Notes (Signed)
Provided discharge education/instructions, all questions and concerns addressed. Pt is not in any distress, discharged home with all of her belongings.

## 2023-07-18 NOTE — Anesthesia Postprocedure Evaluation (Signed)
Anesthesia Post Note  Patient: Lori [name]  Procedure(s) Performed: TOTAL KNEE ARTHROPLASTY (Left: Knee)     Patient location during evaluation: PACU Anesthesia Type: Spinal Level of consciousness: awake and alert Pain management: pain level controlled Vital Signs Assessment: post-procedure vital signs reviewed and stable Respiratory status: spontaneous breathing Cardiovascular status: stable Anesthetic complications: no   No notable events documented.  Last Vitals:  Vitals:   07/18/23 0555 07/18/23 0746  BP: 128/70 117/69  Pulse: 84 71  Resp: 15 16  Temp: 36.8 C 36.6 C  SpO2: 96% 100%    Last Pain:  Vitals:   07/18/23 0746  TempSrc: Oral  PainSc:                  Lewie Loron

## 2023-07-19 DIAGNOSIS — Z860100 Personal history of colon polyps, unspecified: Secondary | ICD-10-CM | POA: Diagnosis not present

## 2023-07-19 DIAGNOSIS — I708 Atherosclerosis of other arteries: Secondary | ICD-10-CM | POA: Diagnosis not present

## 2023-07-19 DIAGNOSIS — I700 Atherosclerosis of aorta: Secondary | ICD-10-CM | POA: Diagnosis not present

## 2023-07-19 DIAGNOSIS — G5600 Carpal tunnel syndrome, unspecified upper limb: Secondary | ICD-10-CM | POA: Diagnosis not present

## 2023-07-19 DIAGNOSIS — R42 Dizziness and giddiness: Secondary | ICD-10-CM | POA: Diagnosis not present

## 2023-07-19 DIAGNOSIS — Z87440 Personal history of urinary (tract) infections: Secondary | ICD-10-CM | POA: Diagnosis not present

## 2023-07-19 DIAGNOSIS — E559 Vitamin D deficiency, unspecified: Secondary | ICD-10-CM | POA: Diagnosis not present

## 2023-07-19 DIAGNOSIS — F419 Anxiety disorder, unspecified: Secondary | ICD-10-CM | POA: Diagnosis not present

## 2023-07-19 DIAGNOSIS — F439 Reaction to severe stress, unspecified: Secondary | ICD-10-CM | POA: Diagnosis not present

## 2023-07-19 DIAGNOSIS — M1711 Unilateral primary osteoarthritis, right knee: Secondary | ICD-10-CM | POA: Diagnosis not present

## 2023-07-19 DIAGNOSIS — R739 Hyperglycemia, unspecified: Secondary | ICD-10-CM | POA: Diagnosis not present

## 2023-07-19 DIAGNOSIS — Z471 Aftercare following joint replacement surgery: Secondary | ICD-10-CM | POA: Diagnosis not present

## 2023-07-19 DIAGNOSIS — Z556 Problems related to health literacy: Secondary | ICD-10-CM | POA: Diagnosis not present

## 2023-07-19 DIAGNOSIS — Z96652 Presence of left artificial knee joint: Secondary | ICD-10-CM | POA: Diagnosis not present

## 2023-07-19 DIAGNOSIS — E785 Hyperlipidemia, unspecified: Secondary | ICD-10-CM | POA: Diagnosis not present

## 2023-07-19 DIAGNOSIS — J309 Allergic rhinitis, unspecified: Secondary | ICD-10-CM | POA: Diagnosis not present

## 2023-07-19 DIAGNOSIS — Z87891 Personal history of nicotine dependence: Secondary | ICD-10-CM | POA: Diagnosis not present

## 2023-07-19 DIAGNOSIS — Z8781 Personal history of (healed) traumatic fracture: Secondary | ICD-10-CM | POA: Diagnosis not present

## 2023-07-19 DIAGNOSIS — F32A Depression, unspecified: Secondary | ICD-10-CM | POA: Diagnosis not present

## 2023-07-19 DIAGNOSIS — M8580 Other specified disorders of bone density and structure, unspecified site: Secondary | ICD-10-CM | POA: Diagnosis not present

## 2023-07-21 DIAGNOSIS — J309 Allergic rhinitis, unspecified: Secondary | ICD-10-CM | POA: Diagnosis not present

## 2023-07-21 DIAGNOSIS — I700 Atherosclerosis of aorta: Secondary | ICD-10-CM | POA: Diagnosis not present

## 2023-07-21 DIAGNOSIS — F439 Reaction to severe stress, unspecified: Secondary | ICD-10-CM | POA: Diagnosis not present

## 2023-07-21 DIAGNOSIS — E559 Vitamin D deficiency, unspecified: Secondary | ICD-10-CM | POA: Diagnosis not present

## 2023-07-21 DIAGNOSIS — Z471 Aftercare following joint replacement surgery: Secondary | ICD-10-CM | POA: Diagnosis not present

## 2023-07-21 DIAGNOSIS — F419 Anxiety disorder, unspecified: Secondary | ICD-10-CM | POA: Diagnosis not present

## 2023-07-21 DIAGNOSIS — M1711 Unilateral primary osteoarthritis, right knee: Secondary | ICD-10-CM | POA: Diagnosis not present

## 2023-07-21 DIAGNOSIS — R739 Hyperglycemia, unspecified: Secondary | ICD-10-CM | POA: Diagnosis not present

## 2023-07-21 DIAGNOSIS — I708 Atherosclerosis of other arteries: Secondary | ICD-10-CM | POA: Diagnosis not present

## 2023-07-21 DIAGNOSIS — Z8781 Personal history of (healed) traumatic fracture: Secondary | ICD-10-CM | POA: Diagnosis not present

## 2023-07-21 DIAGNOSIS — M8580 Other specified disorders of bone density and structure, unspecified site: Secondary | ICD-10-CM | POA: Diagnosis not present

## 2023-07-21 DIAGNOSIS — G5600 Carpal tunnel syndrome, unspecified upper limb: Secondary | ICD-10-CM | POA: Diagnosis not present

## 2023-07-21 DIAGNOSIS — E785 Hyperlipidemia, unspecified: Secondary | ICD-10-CM | POA: Diagnosis not present

## 2023-07-21 DIAGNOSIS — Z860100 Personal history of colon polyps, unspecified: Secondary | ICD-10-CM | POA: Diagnosis not present

## 2023-07-21 DIAGNOSIS — F32A Depression, unspecified: Secondary | ICD-10-CM | POA: Diagnosis not present

## 2023-07-21 DIAGNOSIS — R42 Dizziness and giddiness: Secondary | ICD-10-CM | POA: Diagnosis not present

## 2023-07-25 DIAGNOSIS — I700 Atherosclerosis of aorta: Secondary | ICD-10-CM | POA: Diagnosis not present

## 2023-07-25 DIAGNOSIS — R42 Dizziness and giddiness: Secondary | ICD-10-CM | POA: Diagnosis not present

## 2023-07-25 DIAGNOSIS — G5600 Carpal tunnel syndrome, unspecified upper limb: Secondary | ICD-10-CM | POA: Diagnosis not present

## 2023-07-25 DIAGNOSIS — M8580 Other specified disorders of bone density and structure, unspecified site: Secondary | ICD-10-CM | POA: Diagnosis not present

## 2023-07-25 DIAGNOSIS — I708 Atherosclerosis of other arteries: Secondary | ICD-10-CM | POA: Diagnosis not present

## 2023-07-25 DIAGNOSIS — Z860100 Personal history of colon polyps, unspecified: Secondary | ICD-10-CM | POA: Diagnosis not present

## 2023-07-25 DIAGNOSIS — R739 Hyperglycemia, unspecified: Secondary | ICD-10-CM | POA: Diagnosis not present

## 2023-07-25 DIAGNOSIS — F419 Anxiety disorder, unspecified: Secondary | ICD-10-CM | POA: Diagnosis not present

## 2023-07-25 DIAGNOSIS — J309 Allergic rhinitis, unspecified: Secondary | ICD-10-CM | POA: Diagnosis not present

## 2023-07-25 DIAGNOSIS — Z471 Aftercare following joint replacement surgery: Secondary | ICD-10-CM | POA: Diagnosis not present

## 2023-07-25 DIAGNOSIS — M1711 Unilateral primary osteoarthritis, right knee: Secondary | ICD-10-CM | POA: Diagnosis not present

## 2023-07-25 DIAGNOSIS — F439 Reaction to severe stress, unspecified: Secondary | ICD-10-CM | POA: Diagnosis not present

## 2023-07-25 DIAGNOSIS — E559 Vitamin D deficiency, unspecified: Secondary | ICD-10-CM | POA: Diagnosis not present

## 2023-07-25 DIAGNOSIS — Z8781 Personal history of (healed) traumatic fracture: Secondary | ICD-10-CM | POA: Diagnosis not present

## 2023-07-25 DIAGNOSIS — E785 Hyperlipidemia, unspecified: Secondary | ICD-10-CM | POA: Diagnosis not present

## 2023-07-25 DIAGNOSIS — F32A Depression, unspecified: Secondary | ICD-10-CM | POA: Diagnosis not present

## 2023-07-26 DIAGNOSIS — I708 Atherosclerosis of other arteries: Secondary | ICD-10-CM | POA: Diagnosis not present

## 2023-07-26 DIAGNOSIS — F439 Reaction to severe stress, unspecified: Secondary | ICD-10-CM | POA: Diagnosis not present

## 2023-07-26 DIAGNOSIS — F32A Depression, unspecified: Secondary | ICD-10-CM | POA: Diagnosis not present

## 2023-07-26 DIAGNOSIS — M8580 Other specified disorders of bone density and structure, unspecified site: Secondary | ICD-10-CM | POA: Diagnosis not present

## 2023-07-26 DIAGNOSIS — Z860100 Personal history of colon polyps, unspecified: Secondary | ICD-10-CM | POA: Diagnosis not present

## 2023-07-26 DIAGNOSIS — M1711 Unilateral primary osteoarthritis, right knee: Secondary | ICD-10-CM | POA: Diagnosis not present

## 2023-07-26 DIAGNOSIS — E559 Vitamin D deficiency, unspecified: Secondary | ICD-10-CM | POA: Diagnosis not present

## 2023-07-26 DIAGNOSIS — Z471 Aftercare following joint replacement surgery: Secondary | ICD-10-CM | POA: Diagnosis not present

## 2023-07-26 DIAGNOSIS — G5600 Carpal tunnel syndrome, unspecified upper limb: Secondary | ICD-10-CM | POA: Diagnosis not present

## 2023-07-26 DIAGNOSIS — R42 Dizziness and giddiness: Secondary | ICD-10-CM | POA: Diagnosis not present

## 2023-07-26 DIAGNOSIS — F419 Anxiety disorder, unspecified: Secondary | ICD-10-CM | POA: Diagnosis not present

## 2023-07-26 DIAGNOSIS — Z8781 Personal history of (healed) traumatic fracture: Secondary | ICD-10-CM | POA: Diagnosis not present

## 2023-07-26 DIAGNOSIS — J309 Allergic rhinitis, unspecified: Secondary | ICD-10-CM | POA: Diagnosis not present

## 2023-07-26 DIAGNOSIS — E785 Hyperlipidemia, unspecified: Secondary | ICD-10-CM | POA: Diagnosis not present

## 2023-07-26 DIAGNOSIS — R739 Hyperglycemia, unspecified: Secondary | ICD-10-CM | POA: Diagnosis not present

## 2023-07-26 DIAGNOSIS — I700 Atherosclerosis of aorta: Secondary | ICD-10-CM | POA: Diagnosis not present

## 2023-07-28 DIAGNOSIS — M8580 Other specified disorders of bone density and structure, unspecified site: Secondary | ICD-10-CM | POA: Diagnosis not present

## 2023-07-28 DIAGNOSIS — G5600 Carpal tunnel syndrome, unspecified upper limb: Secondary | ICD-10-CM | POA: Diagnosis not present

## 2023-07-28 DIAGNOSIS — J309 Allergic rhinitis, unspecified: Secondary | ICD-10-CM | POA: Diagnosis not present

## 2023-07-28 DIAGNOSIS — F32A Depression, unspecified: Secondary | ICD-10-CM | POA: Diagnosis not present

## 2023-07-28 DIAGNOSIS — F419 Anxiety disorder, unspecified: Secondary | ICD-10-CM | POA: Diagnosis not present

## 2023-07-28 DIAGNOSIS — R42 Dizziness and giddiness: Secondary | ICD-10-CM | POA: Diagnosis not present

## 2023-07-28 DIAGNOSIS — F439 Reaction to severe stress, unspecified: Secondary | ICD-10-CM | POA: Diagnosis not present

## 2023-07-28 DIAGNOSIS — Z471 Aftercare following joint replacement surgery: Secondary | ICD-10-CM | POA: Diagnosis not present

## 2023-07-28 DIAGNOSIS — E785 Hyperlipidemia, unspecified: Secondary | ICD-10-CM | POA: Diagnosis not present

## 2023-07-28 DIAGNOSIS — I708 Atherosclerosis of other arteries: Secondary | ICD-10-CM | POA: Diagnosis not present

## 2023-07-28 DIAGNOSIS — E559 Vitamin D deficiency, unspecified: Secondary | ICD-10-CM | POA: Diagnosis not present

## 2023-07-28 DIAGNOSIS — M1711 Unilateral primary osteoarthritis, right knee: Secondary | ICD-10-CM | POA: Diagnosis not present

## 2023-07-28 DIAGNOSIS — Z860100 Personal history of colon polyps, unspecified: Secondary | ICD-10-CM | POA: Diagnosis not present

## 2023-07-28 DIAGNOSIS — R739 Hyperglycemia, unspecified: Secondary | ICD-10-CM | POA: Diagnosis not present

## 2023-07-28 DIAGNOSIS — I700 Atherosclerosis of aorta: Secondary | ICD-10-CM | POA: Diagnosis not present

## 2023-07-28 DIAGNOSIS — Z8781 Personal history of (healed) traumatic fracture: Secondary | ICD-10-CM | POA: Diagnosis not present

## 2023-07-31 DIAGNOSIS — J309 Allergic rhinitis, unspecified: Secondary | ICD-10-CM | POA: Diagnosis not present

## 2023-07-31 DIAGNOSIS — Z471 Aftercare following joint replacement surgery: Secondary | ICD-10-CM | POA: Diagnosis not present

## 2023-07-31 DIAGNOSIS — F419 Anxiety disorder, unspecified: Secondary | ICD-10-CM | POA: Diagnosis not present

## 2023-07-31 DIAGNOSIS — R42 Dizziness and giddiness: Secondary | ICD-10-CM | POA: Diagnosis not present

## 2023-07-31 DIAGNOSIS — Z860100 Personal history of colon polyps, unspecified: Secondary | ICD-10-CM | POA: Diagnosis not present

## 2023-07-31 DIAGNOSIS — R739 Hyperglycemia, unspecified: Secondary | ICD-10-CM | POA: Diagnosis not present

## 2023-07-31 DIAGNOSIS — F439 Reaction to severe stress, unspecified: Secondary | ICD-10-CM | POA: Diagnosis not present

## 2023-07-31 DIAGNOSIS — M8580 Other specified disorders of bone density and structure, unspecified site: Secondary | ICD-10-CM | POA: Diagnosis not present

## 2023-07-31 DIAGNOSIS — G5600 Carpal tunnel syndrome, unspecified upper limb: Secondary | ICD-10-CM | POA: Diagnosis not present

## 2023-07-31 DIAGNOSIS — F32A Depression, unspecified: Secondary | ICD-10-CM | POA: Diagnosis not present

## 2023-07-31 DIAGNOSIS — Z8781 Personal history of (healed) traumatic fracture: Secondary | ICD-10-CM | POA: Diagnosis not present

## 2023-07-31 DIAGNOSIS — I708 Atherosclerosis of other arteries: Secondary | ICD-10-CM | POA: Diagnosis not present

## 2023-07-31 DIAGNOSIS — E785 Hyperlipidemia, unspecified: Secondary | ICD-10-CM | POA: Diagnosis not present

## 2023-07-31 DIAGNOSIS — E559 Vitamin D deficiency, unspecified: Secondary | ICD-10-CM | POA: Diagnosis not present

## 2023-07-31 DIAGNOSIS — I700 Atherosclerosis of aorta: Secondary | ICD-10-CM | POA: Diagnosis not present

## 2023-07-31 DIAGNOSIS — M1711 Unilateral primary osteoarthritis, right knee: Secondary | ICD-10-CM | POA: Diagnosis not present

## 2023-08-01 DIAGNOSIS — M25562 Pain in left knee: Secondary | ICD-10-CM | POA: Diagnosis not present

## 2023-08-02 DIAGNOSIS — R42 Dizziness and giddiness: Secondary | ICD-10-CM | POA: Diagnosis not present

## 2023-08-02 DIAGNOSIS — Z8781 Personal history of (healed) traumatic fracture: Secondary | ICD-10-CM | POA: Diagnosis not present

## 2023-08-02 DIAGNOSIS — E559 Vitamin D deficiency, unspecified: Secondary | ICD-10-CM | POA: Diagnosis not present

## 2023-08-02 DIAGNOSIS — I700 Atherosclerosis of aorta: Secondary | ICD-10-CM | POA: Diagnosis not present

## 2023-08-02 DIAGNOSIS — J309 Allergic rhinitis, unspecified: Secondary | ICD-10-CM | POA: Diagnosis not present

## 2023-08-02 DIAGNOSIS — G5600 Carpal tunnel syndrome, unspecified upper limb: Secondary | ICD-10-CM | POA: Diagnosis not present

## 2023-08-02 DIAGNOSIS — F439 Reaction to severe stress, unspecified: Secondary | ICD-10-CM | POA: Diagnosis not present

## 2023-08-02 DIAGNOSIS — M1711 Unilateral primary osteoarthritis, right knee: Secondary | ICD-10-CM | POA: Diagnosis not present

## 2023-08-02 DIAGNOSIS — M8580 Other specified disorders of bone density and structure, unspecified site: Secondary | ICD-10-CM | POA: Diagnosis not present

## 2023-08-02 DIAGNOSIS — Z860100 Personal history of colon polyps, unspecified: Secondary | ICD-10-CM | POA: Diagnosis not present

## 2023-08-02 DIAGNOSIS — E785 Hyperlipidemia, unspecified: Secondary | ICD-10-CM | POA: Diagnosis not present

## 2023-08-02 DIAGNOSIS — I708 Atherosclerosis of other arteries: Secondary | ICD-10-CM | POA: Diagnosis not present

## 2023-08-02 DIAGNOSIS — F419 Anxiety disorder, unspecified: Secondary | ICD-10-CM | POA: Diagnosis not present

## 2023-08-02 DIAGNOSIS — F32A Depression, unspecified: Secondary | ICD-10-CM | POA: Diagnosis not present

## 2023-08-02 DIAGNOSIS — Z471 Aftercare following joint replacement surgery: Secondary | ICD-10-CM | POA: Diagnosis not present

## 2023-08-02 DIAGNOSIS — R739 Hyperglycemia, unspecified: Secondary | ICD-10-CM | POA: Diagnosis not present

## 2023-08-03 DIAGNOSIS — Z8781 Personal history of (healed) traumatic fracture: Secondary | ICD-10-CM | POA: Diagnosis not present

## 2023-08-03 DIAGNOSIS — G5600 Carpal tunnel syndrome, unspecified upper limb: Secondary | ICD-10-CM | POA: Diagnosis not present

## 2023-08-03 DIAGNOSIS — I708 Atherosclerosis of other arteries: Secondary | ICD-10-CM | POA: Diagnosis not present

## 2023-08-03 DIAGNOSIS — E559 Vitamin D deficiency, unspecified: Secondary | ICD-10-CM | POA: Diagnosis not present

## 2023-08-03 DIAGNOSIS — Z471 Aftercare following joint replacement surgery: Secondary | ICD-10-CM | POA: Diagnosis not present

## 2023-08-03 DIAGNOSIS — F32A Depression, unspecified: Secondary | ICD-10-CM | POA: Diagnosis not present

## 2023-08-03 DIAGNOSIS — R739 Hyperglycemia, unspecified: Secondary | ICD-10-CM | POA: Diagnosis not present

## 2023-08-03 DIAGNOSIS — R42 Dizziness and giddiness: Secondary | ICD-10-CM | POA: Diagnosis not present

## 2023-08-03 DIAGNOSIS — Z860100 Personal history of colon polyps, unspecified: Secondary | ICD-10-CM | POA: Diagnosis not present

## 2023-08-03 DIAGNOSIS — J309 Allergic rhinitis, unspecified: Secondary | ICD-10-CM | POA: Diagnosis not present

## 2023-08-03 DIAGNOSIS — F439 Reaction to severe stress, unspecified: Secondary | ICD-10-CM | POA: Diagnosis not present

## 2023-08-03 DIAGNOSIS — M8580 Other specified disorders of bone density and structure, unspecified site: Secondary | ICD-10-CM | POA: Diagnosis not present

## 2023-08-03 DIAGNOSIS — M1711 Unilateral primary osteoarthritis, right knee: Secondary | ICD-10-CM | POA: Diagnosis not present

## 2023-08-03 DIAGNOSIS — I700 Atherosclerosis of aorta: Secondary | ICD-10-CM | POA: Diagnosis not present

## 2023-08-03 DIAGNOSIS — E785 Hyperlipidemia, unspecified: Secondary | ICD-10-CM | POA: Diagnosis not present

## 2023-08-03 DIAGNOSIS — F419 Anxiety disorder, unspecified: Secondary | ICD-10-CM | POA: Diagnosis not present

## 2023-09-01 ENCOUNTER — Ambulatory Visit (INDEPENDENT_AMBULATORY_CARE_PROVIDER_SITE_OTHER): Payer: Medicare Other | Admitting: Internal Medicine

## 2023-09-01 ENCOUNTER — Encounter: Payer: Self-pay | Admitting: Internal Medicine

## 2023-09-01 VITALS — BP 132/80 | HR 81 | Temp 98.6°F | Ht 64.0 in | Wt 172.0 lb

## 2023-09-01 DIAGNOSIS — J069 Acute upper respiratory infection, unspecified: Secondary | ICD-10-CM | POA: Diagnosis not present

## 2023-09-01 MED ORDER — PROMETHAZINE-DM 6.25-15 MG/5ML PO SYRP: 5.0000 mL | ORAL_SOLUTION | Freq: Four times a day (QID) | ORAL | 0 refills | Status: DC | PRN

## 2023-09-01 NOTE — Assessment & Plan Note (Signed)
Outside window for covid-19 treatment so testing not done. Suspect viral illness. Rx promethazine/dm cough medicine and offered flonase for nasal congestion but she is not able to use nose sprays. Counseled on supportive care and typical timeline for recovery.

## 2023-09-01 NOTE — Patient Instructions (Addendum)
We have sent in promethazine/dm cough medicine to use with the tylenol.

## 2023-09-01 NOTE — Progress Notes (Signed)
   Subjective:   Patient ID: Lori [name], female    DOB: 1951-07-13, 72 y.o.   MRN: [id number]  Cough Associated symptoms include chills, myalgias, postnasal drip and rhinorrhea. Pertinent negatives include no ear pain, fever, sore throat, shortness of breath or wheezing.   The patient is a 72 YO female coming in for 5 days of cold symptoms.   Review of Systems  Constitutional:  Positive for activity change, appetite change and chills. Negative for fatigue, fever and unexpected weight change.  HENT:  Positive for congestion, postnasal drip, rhinorrhea and sinus pressure. Negative for ear discharge, ear pain, sinus pain, sneezing, sore throat, tinnitus, trouble swallowing and voice change.   Eyes: Negative.   Respiratory:  Positive for cough. Negative for chest tightness, shortness of breath and wheezing.   Cardiovascular: Negative.   Gastrointestinal: Negative.   Musculoskeletal:  Positive for myalgias.  Neurological: Negative.     Objective:  Physical Exam Constitutional:      Appearance: She is well-developed.  HENT:     Head: Normocephalic and atraumatic.     Comments: Oropharynx with redness and clear drainage, nose with swollen turbinates, TMs normal bilaterally.  Neck:     Thyroid: No thyromegaly.  Cardiovascular:     Rate and Rhythm: Normal rate and regular rhythm.  Pulmonary:     Effort: Pulmonary effort is normal. No respiratory distress.     Breath sounds: Normal breath sounds. No wheezing or rales.  Abdominal:     Palpations: Abdomen is soft.  Musculoskeletal:        General: Tenderness present.     Cervical back: Normal range of motion.  Lymphadenopathy:     Cervical: No cervical adenopathy.  Skin:    General: Skin is warm and dry.  Neurological:     Mental Status: She is alert and oriented to person, place, and time.     Vitals:   09/01/23 1055  BP: 132/80  Pulse: 81  Temp: 98.6 F (37 C)  TempSrc: Oral  SpO2: 99%  Weight: 172 lb (78 kg)  Height:  5\' 4"  (1.626 m)    Assessment & Plan:

## 2023-10-05 DIAGNOSIS — M25562 Pain in left knee: Secondary | ICD-10-CM | POA: Diagnosis not present

## 2023-10-31 ENCOUNTER — Ambulatory Visit (INDEPENDENT_AMBULATORY_CARE_PROVIDER_SITE_OTHER)
Admission: RE | Admit: 2023-10-31 | Discharge: 2023-10-31 | Disposition: A | Discharge status: Home / Self Care | Payer: Medicare Other | Source: Ambulatory Visit | Attending: Internal Medicine | Admitting: Internal Medicine

## 2023-10-31 DIAGNOSIS — M8589 Other specified disorders of bone density and structure, multiple sites: Secondary | ICD-10-CM | POA: Diagnosis not present

## 2023-11-02 DIAGNOSIS — Z1231 Encounter for screening mammogram for malignant neoplasm of breast: Secondary | ICD-10-CM | POA: Diagnosis not present

## 2023-11-02 LAB — HM MAMMOGRAPHY

## 2023-11-03 ENCOUNTER — Encounter: Payer: Self-pay | Admitting: Internal Medicine

## 2023-12-07 DIAGNOSIS — M25562 Pain in left knee: Secondary | ICD-10-CM | POA: Diagnosis not present

## 2024-02-15 ENCOUNTER — Other Ambulatory Visit: Payer: Self-pay

## 2024-02-15 ENCOUNTER — Telehealth: Payer: Self-pay | Admitting: Internal Medicine

## 2024-02-15 MED ORDER — ROSUVASTATIN CALCIUM 20 MG PO TABS: ORAL_TABLET | ORAL | 3 refills | Status: DC

## 2024-02-15 NOTE — Telephone Encounter (Signed)
 Script sent in today

## 2024-02-15 NOTE — Telephone Encounter (Signed)
 Patient has one pill left. She has scheduled and appointment for 02/21/24.   Prescription Request  02/15/2024  LOV: 06/06/2023  What is the name of the medication or equipment? rosuvastatin  (CRESTOR ) 20 MG tablet   Have you contacted your pharmacy to request a refill? Yes   Which pharmacy would you like this sent to?  [id number] - [place], [place] - [address] AT [facility] OF [address] [place] [address] Phone: [phone] Fax: [phone]    Patient notified that their request is being sent to the clinical staff for review and that they should receive a response within 2 business days.   Please advise at Mobile [phone] (mobile)

## 2024-02-21 ENCOUNTER — Ambulatory Visit (INDEPENDENT_AMBULATORY_CARE_PROVIDER_SITE_OTHER): Admitting: Internal Medicine

## 2024-02-21 ENCOUNTER — Encounter: Payer: Self-pay | Admitting: Internal Medicine

## 2024-02-21 VITALS — BP 128/70 | HR 68 | Temp 98.6°F | Ht 64.0 in | Wt 167.0 lb

## 2024-02-21 DIAGNOSIS — R202 Paresthesia of skin: Secondary | ICD-10-CM | POA: Insufficient documentation

## 2024-02-21 MED ORDER — GABAPENTIN 100 MG PO CAPS
100.0000 mg | ORAL_CAPSULE | Freq: Every day | ORAL | 3 refills | Status: DC
Start: 2024-02-21 — End: 2024-06-25

## 2024-02-21 NOTE — Patient Instructions (Addendum)
      Medications changes include :   gabapentin  100-300 mg at bedtime    A referral was ordered [employer] and someone will call you to schedule an appointment.

## 2024-02-21 NOTE — Assessment & Plan Note (Signed)
 Acute Started 6 weeks ago - has gotten worse B/l hand tingling and burning No pain, weakness At rest and with activity There is decreased sensation on exam, normal strength Referral to ortho Trial of gabapentin  100-300 mg HS prn - discussed possible drowsiness

## 2024-02-21 NOTE — Progress Notes (Signed)
 Subjective:    Patient ID: Lori [name], female    DOB: [birth year], 73 y.o.   MRN: [id number]      HPI Lori [name] is here for  Chief Complaint  Patient presents with   Numbness    Numbness in both hands; feels like numbness and burning    B/l hand tingling and burning - started about 6 weeks ago.   It is just in the palms.  Initially it was intermittent - now it is continuous and more intense.   She denies pain.    Only palms involved.  No symptoms similar elsewhere.  Has it with activity and at rest.      Tried heat, ice, tylenol      Medications and allergies reviewed with patient and updated if appropriate.  Current Outpatient Medications on File Prior to Visit  Medication Sig Dispense Refill   ALPRAZolam  (XANAX ) 0.25 MG tablet Take 1 tablet (0.25 mg total) by mouth daily as needed for anxiety. 30 tablet 0   Ascorbic Acid (VITAMIN C) 500 MG CHEW Chew 500 mg by mouth daily.     aspirin  EC 81 MG tablet Take 1 tablet (81 mg total) by mouth 2 (two) times daily with a meal. Take x 1 month post op to decrease risk of blood clots. 60 tablet 0   b complex vitamins capsule Take 1 capsule by mouth daily.     Calcium  Carbonate-Vitamin D  600-400 MG-UNIT tablet Take 1 tablet by mouth daily.     celecoxib  (CELEBREX ) 200 MG capsule Take 1 capsule (200 mg total) by mouth 2 (two) times daily. 60 capsule 0   Cholecalciferol (VITAMIN D3) 50 MCG (2000 UT) TABS Take 2,000 Units by mouth daily.      COD LIVER OIL PO Take 1 capsule by mouth daily.      conjugated estrogens (PREMARIN) vaginal cream Place 1 Applicatorful vaginally daily as needed (irritation).     docusate sodium  (COLACE) 100 MG capsule Take 1 capsule (100 mg total) by mouth 2 (two) times daily. 30 capsule 0   Ginkgo Biloba 60 MG CAPS Take 60 mg by mouth daily.      Misc Natural Products (TART CHERRY ADVANCED) CAPS Take 1 capsule by mouth daily.      Multiple Vitamins-Minerals (ONE-A-DAY WOMENS 50 PLUS PO) Take 1 tablet by mouth  daily.     oxyCODONE -acetaminophen  (PERCOCET/ROXICET) 5-325 MG tablet Take 1-2 tablets by mouth every 6 (six) hours as needed for severe pain. (Patient not taking: Reported on 09/01/2023) 40 tablet 0   promethazine -dextromethorphan (PROMETHAZINE -DM) 6.25-15 MG/5ML syrup Take 5 mLs by mouth 4 (four) times daily as needed. 118 mL 0   pyridOXINE (VITAMIN B-6) 100 MG tablet Take 100 mg by mouth daily.     Rhubarb (ESTROVEN COMPLETE PO) Take 1 tablet by mouth daily.      rosuvastatin  (CRESTOR ) 20 MG tablet TAKE 1 TABLET(20 MG) BY MOUTH DAILY 90 tablet 3   tiZANidine  (ZANAFLEX ) 2 MG tablet Take 1 tablet (2 mg total) by mouth every 8 (eight) hours as needed for muscle spasms. 40 tablet 0   Turmeric (QC TUMERIC COMPLEX PO) Take 1 tablet by mouth daily.      vitamin B-12 (CYANOCOBALAMIN) 500 MCG tablet Take 500 mcg by mouth daily.     No current facility-administered medications on file prior to visit.    Review of Systems     Objective:   Vitals:   02/21/24 1342  BP: 128/70  Pulse: 68  Temp: 98.6 F (37 C)  SpO2: 98%   BP Readings from Last 3 Encounters:  02/21/24 128/70  09/01/23 132/80  07/18/23 126/64   Wt Readings from Last 3 Encounters:  02/21/24 167 lb (75.8 kg)  09/01/23 172 lb (78 kg)  07/17/23 166 lb 9.6 oz (75.6 kg)   Body mass index is 28.67 kg/m.    Physical Exam Constitutional:      General: She is not in acute distress.    Appearance: Normal appearance. She is not ill-appearing.  HENT:     Head: Normocephalic and atraumatic.  Musculoskeletal:        General: No swelling, tenderness or deformity.  Skin:    General: Skin is warm and dry.     Findings: No erythema or rash.  Neurological:     Mental Status: She is alert.     Sensory: Sensory deficit (decreased sensation b/l palms) present.     Motor: No weakness.            Assessment & Plan:    See Problem List for Assessment and Plan of chronic medical problems.

## 2024-03-06 DIAGNOSIS — Z01419 Encounter for gynecological examination (general) (routine) without abnormal findings: Secondary | ICD-10-CM | POA: Diagnosis not present

## 2024-03-07 DIAGNOSIS — M25562 Pain in left knee: Secondary | ICD-10-CM | POA: Diagnosis not present

## 2024-04-16 DIAGNOSIS — M65341 Trigger finger, right ring finger: Secondary | ICD-10-CM | POA: Diagnosis not present

## 2024-04-16 DIAGNOSIS — G5601 Carpal tunnel syndrome, right upper limb: Secondary | ICD-10-CM | POA: Diagnosis not present

## 2024-04-16 DIAGNOSIS — G5602 Carpal tunnel syndrome, left upper limb: Secondary | ICD-10-CM | POA: Diagnosis not present

## 2024-04-24 DIAGNOSIS — G5602 Carpal tunnel syndrome, left upper limb: Secondary | ICD-10-CM | POA: Diagnosis not present

## 2024-04-24 DIAGNOSIS — G5601 Carpal tunnel syndrome, right upper limb: Secondary | ICD-10-CM | POA: Diagnosis not present

## 2024-04-24 DIAGNOSIS — G5603 Carpal tunnel syndrome, bilateral upper limbs: Secondary | ICD-10-CM | POA: Diagnosis not present

## 2024-04-30 DIAGNOSIS — G5603 Carpal tunnel syndrome, bilateral upper limbs: Secondary | ICD-10-CM | POA: Diagnosis not present

## 2024-04-30 DIAGNOSIS — M65341 Trigger finger, right ring finger: Secondary | ICD-10-CM | POA: Diagnosis not present

## 2024-05-23 ENCOUNTER — Ambulatory Visit

## 2024-05-23 VITALS — Ht 64.0 in | Wt 167.0 lb

## 2024-05-23 DIAGNOSIS — Z0000 Encounter for general adult medical examination without abnormal findings: Secondary | ICD-10-CM | POA: Diagnosis not present

## 2024-05-23 NOTE — Progress Notes (Signed)
 Subjective:  Please attest and cosign this visit due to patients primary care provider not being in the office at the time the visit was completed.  (Pt of Dr Glade Hope)   Lori [name] is a 73 y.o. who presents for a Medicare Wellness preventive visit.  As a reminder, Annual Wellness Visits don't include a physical exam, and some assessments may be limited, especially if this visit is performed virtually. We may recommend an in-person follow-up visit with your provider if needed.  Visit Complete: Virtual I connected with  Lori [name] on 05/23/24 by a audio enabled telemedicine application and verified that I am speaking with the correct person using two identifiers.  Patient Location: Home  Provider Location: Office/Clinic  I discussed the limitations of evaluation and management by telemedicine. The patient expressed understanding and agreed to proceed.  Vital Signs: Because this visit was a virtual/telehealth visit, some criteria may be missing or patient reported. Any vitals not documented were not able to be obtained and vitals that have been documented are patient reported.  VideoDeclined- This patient declined Librarian, academic. Therefore the visit was completed with audio only.  Persons Participating in Visit: Patient.  AWV Questionnaire: No: Patient Medicare AWV questionnaire was not completed prior to this visit.  Cardiac Risk Factors include: advanced age (>39men, >77 women);dyslipidemia     Objective:    Today's Vitals   05/23/24 1342  Weight: 167 lb (75.8 kg)  Height: 5' 4 (1.626 m)   Body mass index is 28.67 kg/m.     05/23/2024    1:42 PM 07/17/2023   10:38 AM 07/06/2023    8:59 AM 06/23/2023    9:11 AM 12/25/2021    9:05 PM 10/01/2021   11:17 AM 03/04/2019   12:19 PM  Advanced Directives  Does Patient Have a Medical Advance Directive? Yes Yes Yes Yes No Yes Yes  Type of Estate agent of [place];Living  will Healthcare Power of [place];Living will Healthcare Power of [place];Living will Healthcare Power of [place];Living will  Healthcare Power of [place];Living will   Does patient want to make changes to medical advance directive? No - Patient declined No - Patient declined    No - Patient declined   Copy of Healthcare Power of Attorney in Chart? Yes - validated most recent copy scanned in chart (See row information) Yes - validated most recent copy scanned in chart (See row information) Yes - validated most recent copy scanned in chart (See row information) No - copy requested  No - copy requested   Would patient like information on creating a medical advance directive?     No - Patient declined      Current Medications (verified) Outpatient Encounter Medications as of 05/23/2024  Medication Sig   ALPRAZolam  (XANAX ) 0.25 MG tablet Take 1 tablet (0.25 mg total) by mouth daily as needed for anxiety.   Ascorbic Acid (VITAMIN C) 500 MG CHEW Chew 500 mg by mouth daily.   aspirin  EC 81 MG tablet Take 1 tablet (81 mg total) by mouth 2 (two) times daily with a meal. Take x 1 month post op to decrease risk of blood clots.   b complex vitamins capsule Take 1 capsule by mouth daily.   Calcium  Carbonate-Vitamin D  600-400 MG-UNIT tablet Take 1 tablet by mouth daily.   celecoxib  (CELEBREX ) 200 MG capsule Take 1 capsule (200 mg total) by mouth 2 (two) times daily.   Cholecalciferol (VITAMIN D3) 50 MCG (2000  UT) TABS Take 2,000 Units by mouth daily.    COD LIVER OIL PO Take 1 capsule by mouth daily.    conjugated estrogens (PREMARIN) vaginal cream Place 1 Applicatorful vaginally daily as needed (irritation).   docusate sodium  (COLACE) 100 MG capsule Take 1 capsule (100 mg total) by mouth 2 (two) times daily.   gabapentin  (NEURONTIN ) 100 MG capsule Take 1-3 capsules (100-300 mg total) by mouth at bedtime.   Ginkgo Biloba 60 MG CAPS Take 60 mg by mouth daily.    Misc Natural Products (TART CHERRY ADVANCED)  CAPS Take 1 capsule by mouth daily.    Multiple Vitamins-Minerals (ONE-A-DAY WOMENS 50 PLUS PO) Take 1 tablet by mouth daily.   pyridOXINE (VITAMIN B-6) 100 MG tablet Take 100 mg by mouth daily.   Rhubarb (ESTROVEN COMPLETE PO) Take 1 tablet by mouth daily.    rosuvastatin  (CRESTOR ) 20 MG tablet TAKE 1 TABLET(20 MG) BY MOUTH DAILY   tiZANidine  (ZANAFLEX ) 2 MG tablet Take 1 tablet (2 mg total) by mouth every 8 (eight) hours as needed for muscle spasms.   Turmeric (QC TUMERIC COMPLEX PO) Take 1 tablet by mouth daily.    vitamin B-12 (CYANOCOBALAMIN) 500 MCG tablet Take 500 mcg by mouth daily.   No facility-administered encounter medications on file as of 05/23/2024.    Allergies (verified) Codeine, Doxycycline , Hycodan [hydrocodone  bit-homatrop mbr], Other, Penicillins, and Tramadol    History: Past Medical History:  Diagnosis Date   Acute pain of left shoulder 07/03/2019   Arthritis of right knee 07/24/2019   Atypical chest pain 11/14/2018   Chronic pain of right knee 05/22/2019   CTS (carpal tunnel syndrome)    chronic   Depression 01/16/2013   Family history of colon cancer 06/06/2014   Headache 06/28/2019   History of colon polyps    Hyperglycemia 05/08/2018   Hyperlipidemia 04/27/2017   Left arm numbness 11/21/2017   Left wrist pain 07/03/2019   Osteopenia    Personal history of colonic polyps 06/06/2014   PONV (postoperative nausea and vomiting)    Pyelonephritis 05/02/2017   Stress at home 11/14/2018   stress w/dtr who has ovarian cancer 1/20   Tubular adenoma    UTI (urinary tract infection)    Vertigo 04/17/2018   Past Surgical History:  Procedure Laterality Date   ABDOMINAL HYSTERECTOMY  1994   fibroids   APPENDECTOMY     BUNIONECTOMY Right    BUNIONECTOMY Left 2018   CHOLECYSTECTOMY     COLONOSCOPY  12/09/2008   Dr.Rourk- normal rectum, polyp at the hepatic flexure bx= tubular adenoma. the remainder of the colonic mucosa and terminal ileum mucosa appeared normal.   COLONOSCOPY   2006   Dr.Sam - colonic adenoma   COLONOSCOPY N/A 06/25/2014   Procedure: COLONOSCOPY;  Surgeon: Lamar CHRISTELLA Hollingshead, MD;  Location: [facility];  Service: Endoscopy;  Laterality: N/A;  1030 DR REQUEST TIME   HEMORRHOID SURGERY     KNEE ARTHROSCOPY Right    torn meniscus   KNEE ARTHROSCOPY Left    ORIF TOE FRACTURE Right    rotator Cuff surgery     TOTAL KNEE ARTHROPLASTY Left 07/17/2023   Procedure: TOTAL KNEE ARTHROPLASTY;  Surgeon: Yvone Rush, MD;  Location: [facility];  Service: Orthopedics;  Laterality: Left;   TUBAL LIGATION     Family History  Problem Relation Age of Onset   Bipolar disorder Sister    Schizophrenia Sister    Kidney disease Sister    Hypertension Sister  Bipolar disorder Sister    Kidney disease Sister    Hypertension Mother    Mental illness Mother    Dementia Mother    Pancreatic cancer Mother    Cancer Father 73       colon; bladder   Hypertension Father    Heart disease Father    Arthritis Father    Prostate cancer Father    Esophageal cancer Father    Stomach cancer Father    Colon cancer Father    Kidney disease Father    Bladder Cancer Father    Ovarian cancer Daughter    Bipolar disorder Son    Liver cancer Neg Hx    Social History   Socioeconomic History   Marital status: Widowed    Spouse name: Not on file   Number of children: 2   Years of education: Not on file   Highest education level: Not on file  Occupational History   Occupation: Retired    Associate Professor: [employer]  Tobacco Use   Smoking status: Former    Current packs/day: 0.00    Average packs/day: 1 pack/day for 22.6 years (22.6 ttl pk-yrs)    Types: Cigarettes    Start date: 04/13/1971    Quit date: 11/01/1993    Years since quitting: 30.5   Smokeless tobacco: Never  Vaping Use   Vaping status: Never Used  Substance and Sexual Activity   Alcohol use: Yes    Alcohol/week: 0.0 standard drinks of alcohol    Comment: wine   Drug use: No   Sexual activity: Yes     Birth control/protection: Surgical  Other Topics Concern   Not on file  Social History Narrative   Exercising regularly - senior exercises, walking   Has 2 children.   Lives alone   Social Drivers of Health   Financial Resource Strain: Low Risk  (05/23/2024)   Overall Financial Resource Strain (CARDIA)    Difficulty of Paying Living Expenses: Not hard at all  Food Insecurity: No Food Insecurity (05/23/2024)   Hunger Vital Sign    Worried About Running Out of Food in the Last Year: Never true    Ran Out of Food in the Last Year: Never true  Transportation Needs: No Transportation Needs (05/23/2024)   PRAPARE - Administrator, Civil Service (Medical): No    Lack of Transportation (Non-Medical): No  Physical Activity: Sufficiently Active (05/23/2024)   Exercise Vital Sign    Days of Exercise per Week: 7 days    Minutes of Exercise per Session: 60 min  Stress: No Stress Concern Present (05/23/2024)   [employer] of Occupational Health - Occupational Stress Questionnaire    Feeling of Stress: Not at all  Social Connections: Moderately Isolated (05/23/2024)   Social Connection and Isolation Panel    Frequency of Communication with Friends and Family: More than three times a week    Frequency of Social Gatherings with Friends and Family: Never    Attends Religious Services: More than 4 times per year    Active Member of [employer] or Organizations: No    Attends Banker Meetings: Never    Marital Status: Widowed    Tobacco Counseling Counseling given: No    Clinical Intake:  Pre-visit preparation completed: Yes  Pain : No/denies pain     BMI - recorded: 28.67 Nutritional Status: BMI 25 -29 Overweight Nutritional Risks: None Diabetes: No  Lab Results  Component Value Date   HGBA1C 5.6  06/06/2023   HGBA1C 5.6 05/31/2022   HGBA1C 5.5 05/25/2021     How often do you need to have someone help you when you read instructions, pamphlets, or other  written materials from your doctor or pharmacy?: 1 - Never  Interpreter Needed?: No  Information entered by :: Verdie Saba, CMA   Activities of Daily Living     05/23/2024    1:53 PM 07/17/2023    5:55 PM  In your present state of health, do you have any difficulty performing the following activities:  Hearing? 0 0  Vision? 0 0  Difficulty concentrating or making decisions? 0 0  Walking or climbing stairs? 0 1  Dressing or bathing? 0 0  Doing errands, shopping? 0 0  Preparing Food and eating ? N   Using the Toilet? N   In the past six months, have you accidently leaked urine? N   Do you have problems with loss of bowel control? N   Managing your Medications? N   Managing your Finances? N   Housekeeping or managing your Housekeeping? N     Patient Care Team: Geofm Glade PARAS, MD as PCP - General (Internal Medicine) Jeffrie Oneil BROCKS, MD as PCP - Cardiology (Cardiology) Patel, Donika K, DO as Consulting Physician (Neurology)  I have updated your Care Teams any recent Medical Services you may have received from other providers in the past year.     Assessment:   This is a routine wellness examination for Lori [name].  Hearing/Vision screen Hearing Screening - Comments:: Denies hearing difficulties   Vision Screening - Comments:: Wears rx glasses - up to date with routine eye exams with [facility]   Goals Addressed               This Visit's Progress     Patient Stated (pt-stated)        Patient stated that she will continue to be active       Depression Screen     05/23/2024    1:50 PM 02/21/2024    1:50 PM 06/23/2023    9:24 AM 06/06/2023   10:05 AM 04/13/2023    8:08 AM 12/06/2022    8:08 AM 09/20/2022    2:51 PM  PHQ 2/9 Scores  PHQ - 2 Score 0 0 0 0 2 0 0  PHQ- 9 Score 0  0 0  0 1    Fall Risk     05/23/2024    1:46 PM 02/21/2024    1:49 PM 09/01/2023   10:57 AM 06/23/2023    9:13 AM 06/06/2023   10:05 AM  Fall Risk   Falls in the past year? 0 0  0 0   Number falls in past yr: 0 0 0 0 0  Injury with Fall? 0 0 0 0 0  Risk for fall due to : No Fall Risks No Fall Risks  No Fall Risks No Fall Risks  Follow up Falls evaluation completed;Falls prevention discussed Falls evaluation completed Falls evaluation completed Falls prevention discussed;Falls evaluation completed Falls evaluation completed    MEDICARE RISK AT HOME:  Medicare Risk at Home Any stairs in or around the home?: No If so, are there any without handrails?: No Home free of loose throw rugs in walkways, pet beds, electrical cords, etc?: Yes Adequate lighting in your home to reduce risk of falls?: Yes Life alert?: No Use of a cane, walker or w/c?: No Grab bars in the bathroom?: Yes Shower chair or  bench in shower?: Yes Elevated toilet seat or a handicapped toilet?: Yes  TIMED UP AND GO:  Was the test performed?  No  Cognitive Function: 6CIT completed        05/23/2024    1:46 PM 06/23/2023    9:15 AM  6CIT Screen  What Year? 0 points 0 points  What month? 0 points 0 points  What time? 0 points 0 points  Count back from 20 0 points 0 points  Months in reverse 0 points 0 points  Repeat phrase 0 points 0 points  Total Score 0 points 0 points    Immunizations Immunization History  Administered Date(s) Administered   Fluad Quad(high Dose 65+) 07/03/2019   Influenza,inj,Quad PF,6+ Mos 07/31/2017   Influenza-Unspecified 07/31/2013, 12/16/2016   Moderna Sars-Covid-2 Vaccination 12/24/2019, 01/21/2020   PNEUMOCOCCAL CONJUGATE-20 05/31/2022   Tdap 05/08/2018    Screening Tests Health Maintenance  Topic Date Due   Zoster Vaccines- Shingrix (1 of 2) Never done   COVID-19 Vaccine (3 - 2024-25 season) 06/25/2023   INFLUENZA VACCINE  05/24/2024   Colonoscopy  10/01/2024   Medicare Annual Wellness (AWV)  05/23/2025   DEXA SCAN  10/30/2025   MAMMOGRAM  11/01/2025   DTaP/Tdap/Td (2 - Td or Tdap) 05/08/2028   Pneumococcal Vaccine: 50+ Years  Completed   Hepatitis C  Screening  Completed   Hepatitis B Vaccines  Aged Out   HPV VACCINES  Aged Out   Meningococcal B Vaccine  Aged Out    Health Maintenance  Health Maintenance Due  Topic Date Due   Zoster Vaccines- Shingrix (1 of 2) Never done   COVID-19 Vaccine (3 - 2024-25 season) 06/25/2023   Health Maintenance Items Addressed: 05/23/2024   Additional Screening:  Vision Screening: Recommended annual ophthalmology exams for early detection of glaucoma and other disorders of the eye. Would you like a referral to an eye doctor? No    Dental Screening: Recommended annual dental exams for proper oral hygiene  Community Resource Referral / Chronic Care Management: CRR required this visit?  No   CCM required this visit?  No   Plan:    I have personally reviewed and noted the following in the patient's chart:   Medical and social history Use of alcohol, tobacco or illicit drugs  Current medications and supplements including opioid prescriptions. Patient is not currently taking opioid prescriptions. Functional ability and status Nutritional status Physical activity Advanced directives List of other physicians Hospitalizations, surgeries, and ER visits in previous 12 months Vitals Screenings to include cognitive, depression, and falls Referrals and appointments  In addition, I have reviewed and discussed with patient certain preventive protocols, quality metrics, and best practice recommendations. A written personalized care plan for preventive services as well as general preventive health recommendations were provided to patient.   Verdie CHRISTELLA Saba, CMA   05/23/2024   After Visit Summary: (MyChart) Due to this being a telephonic visit, the after visit summary with patients personalized plan was offered to patient via MyChart   Notes: Nothing significant to report at this time.

## 2024-05-23 NOTE — Patient Instructions (Addendum)
 Ms. Hinostroza , Thank you for taking time out of your busy schedule to complete your Annual Wellness Visit with me. I enjoyed our conversation and look forward to speaking with you again next year. I, as well as your care team,  appreciate your ongoing commitment to your health goals. Please review the following plan we discussed and let me know if I can assist you in the future. Your Game plan/ To Do List    Referrals: If you haven't heard from the office you've been referred to, please reach out to them at the phone provided.   Follow up Visits: We will see or speak with you next year for your Next Medicare AWV with our clinical staff Have you seen your provider in the last 6 months (3 months if uncontrolled diabetes)? Yes  Clinician Recommendations:  Aim for 30 minutes of exercise or brisk walking, 6-8 glasses of water , and 5 servings of fruits and vegetables each day.       This is a list of the screenings recommended for you:  Health Maintenance  Topic Date Due   Zoster (Shingles) Vaccine (1 of 2) Never done   COVID-19 Vaccine (3 - 2024-25 season) 06/25/2023   Flu Shot  05/24/2024   Colon Cancer Screening  10/01/2024   Medicare Annual Wellness Visit  05/23/2025   DEXA scan (bone density measurement)  10/30/2025   Mammogram  11/01/2025   DTaP/Tdap/Td vaccine (2 - Td or Tdap) 05/08/2028   Pneumococcal Vaccine for age over 48  Completed   Hepatitis C Screening  Completed   Hepatitis B Vaccine  Aged Out   HPV Vaccine  Aged Out   Meningitis B Vaccine  Aged Out    Advanced directives: (In Chart) A copy of your advanced directives are scanned into your chart should your provider ever need it. Advance Care Planning is important because it:  [x]  Makes sure you receive the medical care that is consistent with your values, goals, and preferences  [x]  It provides guidance to your family and loved ones and reduces their decisional burden about whether or not they are making the right decisions  based on your wishes.  Follow the link provided in your after visit summary or read over the paperwork we have mailed to you to help you started getting your Advance Directives in place. If you need assistance in completing these, please reach out to us  so that we can help you!

## 2024-06-24 ENCOUNTER — Encounter: Payer: Self-pay | Admitting: Internal Medicine

## 2024-06-24 NOTE — Progress Notes (Unsigned)
 Subjective:    Patient ID: Lori [name], female    DOB: 1951/01/16, 73 y.o.   MRN: [id number]      HPI Lori [name] is here for a Physical exam and her chronic medical problems.   Had lightning bolt feeling in her chest once on Friday that was intense and twice on Saturday that was more mild.  The feeling only lasted a second.     Medications and allergies reviewed with patient and updated if appropriate.  Current Outpatient Medications on File Prior to Visit  Medication Sig Dispense Refill   ALPRAZolam  (XANAX ) 0.25 MG tablet Take 1 tablet (0.25 mg total) by mouth daily as needed for anxiety. 30 tablet 0   Ascorbic Acid (VITAMIN C) 500 MG CHEW Chew 500 mg by mouth daily.     aspirin  EC 81 MG tablet Take 1 tablet (81 mg total) by mouth 2 (two) times daily with a meal. Take x 1 month post op to decrease risk of blood clots. 60 tablet 0   b complex vitamins capsule Take 1 capsule by mouth daily.     Calcium  Carbonate-Vitamin D  600-400 MG-UNIT tablet Take 1 tablet by mouth daily.     Cholecalciferol (VITAMIN D3) 50 MCG (2000 UT) TABS Take 2,000 Units by mouth daily.      COD LIVER OIL PO Take 1 capsule by mouth daily.      conjugated estrogens (PREMARIN) vaginal cream Place 1 Applicatorful vaginally daily as needed (irritation).     docusate sodium  (COLACE) 100 MG capsule Take 1 capsule (100 mg total) by mouth 2 (two) times daily. 30 capsule 0   Ginkgo Biloba 60 MG CAPS Take 60 mg by mouth daily.      Misc Natural Products (TART CHERRY ADVANCED) CAPS Take 1 capsule by mouth daily.      Multiple Vitamins-Minerals (ONE-A-DAY WOMENS 50 PLUS PO) Take 1 tablet by mouth daily.     pyridOXINE (VITAMIN B-6) 100 MG tablet Take 100 mg by mouth daily.     Rhubarb (ESTROVEN COMPLETE PO) Take 1 tablet by mouth daily.      Turmeric (QC TUMERIC COMPLEX PO) Take 1 tablet by mouth daily.      vitamin B-12 (CYANOCOBALAMIN) 500 MCG tablet Take 500 mcg by mouth daily.     No current facility-administered  medications on file prior to visit.    Review of Systems  Constitutional:  Negative for fever.  Eyes:  Negative for visual disturbance.  Respiratory:  Negative for cough, shortness of breath and wheezing.   Cardiovascular:  Positive for chest pain (lightneing pain). Negative for palpitations and leg swelling.  Gastrointestinal:  Negative for abdominal pain, blood in stool, constipation and diarrhea.       No gerd  Genitourinary:  Negative for dysuria.  Musculoskeletal:  Positive for arthralgias. Negative for back pain.  Skin:  Negative for rash.  Neurological:  Negative for light-headedness and headaches.  Psychiatric/Behavioral:  Negative for dysphoric mood. The patient is not nervous/anxious.        Objective:   Vitals:   06/25/24 1259  BP: 132/80  Pulse: 60  Temp: 98.6 F (37 C)  SpO2: 99%   Filed Weights   06/25/24 1259  Weight: 170 lb (77.1 kg)   Body mass index is 29.18 kg/m.  BP Readings from Last 3 Encounters:  06/25/24 132/80  02/21/24 128/70  09/01/23 132/80    Wt Readings from Last 3 Encounters:  06/25/24 170 lb (77.1 kg)  05/23/24  167 lb (75.8 kg)  02/21/24 167 lb (75.8 kg)       Physical Exam Constitutional: She appears well-developed and well-nourished. No distress.  HENT:  Head: Normocephalic and atraumatic.  Right Ear: External ear normal. Normal ear canal and TM Left Ear: External ear normal.  Normal ear canal and TM Mouth/Throat: Oropharynx is clear and moist.  Eyes: Conjunctivae normal.  Neck: Neck supple. No tracheal deviation present. No thyromegaly present.  No carotid bruit  Cardiovascular: Normal rate, regular rhythm and normal heart sounds.   No murmur heard.  No edema. Pulmonary/Chest: Effort normal and breath sounds normal. No respiratory distress. She has no wheezes. She has no rales.  Breast: deferred   Abdominal: Soft. She exhibits no distension. There is no tenderness.  Lymphadenopathy: She has no cervical adenopathy.   Skin: Skin is warm and dry. She is not diaphoretic.  Psychiatric: She has a normal mood and affect. Her behavior is normal.     Lab Results  Component Value Date   WBC 3.5 (L) 07/06/2023   HGB 14.2 07/06/2023   HCT 42.2 07/06/2023   PLT 248 07/06/2023   GLUCOSE 111 (H) 07/06/2023   CHOL 256 (H) 06/06/2023   TRIG 161.0 (H) 06/06/2023   HDL 93.70 06/06/2023   LDLCALC 130 (H) 06/06/2023   ALT 18 06/06/2023   AST 18 06/06/2023   NA 137 07/06/2023   K 4.3 07/06/2023   CL 103 07/06/2023   CREATININE 0.74 07/06/2023   BUN 12 07/06/2023   CO2 26 07/06/2023   TSH 1.74 06/06/2023   HGBA1C 5.6 06/06/2023         Assessment & Plan:   Physical exam: Screening blood work  ordered Exercise  walking Weight  normal Substance abuse  none   Reviewed recommended immunizations.   Health Maintenance  Topic Date Due   Zoster Vaccines- Shingrix (1 of 2) Never done   Colonoscopy  10/01/2024   COVID-19 Vaccine (3 - 2025-26 season) 07/11/2024 (Originally 06/24/2024)   INFLUENZA VACCINE  01/21/2025 (Originally 05/24/2024)   Medicare Annual Wellness (AWV)  05/23/2025   DEXA SCAN  10/30/2025   MAMMOGRAM  11/01/2025   DTaP/Tdap/Td (2 - Td or Tdap) 05/08/2028   Pneumococcal Vaccine: 50+ Years  Completed   Hepatitis C Screening  Completed   HPV VACCINES  Aged Out   Meningococcal B Vaccine  Aged Out          See Problem List for Assessment and Plan of chronic medical problems.

## 2024-06-24 NOTE — Patient Instructions (Addendum)

## 2024-06-25 ENCOUNTER — Ambulatory Visit (INDEPENDENT_AMBULATORY_CARE_PROVIDER_SITE_OTHER): Admitting: Internal Medicine

## 2024-06-25 VITALS — BP 132/80 | HR 60 | Temp 98.6°F | Ht 64.0 in | Wt 170.0 lb

## 2024-06-25 DIAGNOSIS — R739 Hyperglycemia, unspecified: Secondary | ICD-10-CM | POA: Diagnosis not present

## 2024-06-25 DIAGNOSIS — E559 Vitamin D deficiency, unspecified: Secondary | ICD-10-CM

## 2024-06-25 DIAGNOSIS — Z0001 Encounter for general adult medical examination with abnormal findings: Secondary | ICD-10-CM

## 2024-06-25 DIAGNOSIS — Z96652 Presence of left artificial knee joint: Secondary | ICD-10-CM | POA: Insufficient documentation

## 2024-06-25 DIAGNOSIS — F419 Anxiety disorder, unspecified: Secondary | ICD-10-CM | POA: Diagnosis not present

## 2024-06-25 DIAGNOSIS — Z0000 Encounter for general adult medical examination without abnormal findings: Secondary | ICD-10-CM

## 2024-06-25 DIAGNOSIS — E782 Mixed hyperlipidemia: Secondary | ICD-10-CM | POA: Diagnosis not present

## 2024-06-25 DIAGNOSIS — M8589 Other specified disorders of bone density and structure, multiple sites: Secondary | ICD-10-CM

## 2024-06-25 LAB — LIPID PANEL
Cholesterol: 266 mg/dL — ABNORMAL HIGH (ref 0–200)
HDL: 80.2 mg/dL (ref >39.00)
LDL Cholesterol: 166 mg/dL — ABNORMAL HIGH (ref 0–99)
NonHDL: 185.35
Total CHOL/HDL Ratio: 3
Triglycerides: 96 mg/dL (ref 0.0–149.0)
VLDL: 19.2 mg/dL (ref 0.0–40.0)

## 2024-06-25 LAB — COMPREHENSIVE METABOLIC PANEL WITH GFR
ALT: 15 U/L (ref 0–35)
AST: 21 U/L (ref 0–37)
Albumin: 4.4 g/dL (ref 3.5–5.2)
Alkaline Phosphatase: 68 U/L (ref 39–117)
BUN: 12 mg/dL (ref 6–23)
CO2: 30 meq/L (ref 19–32)
Calcium: 9.1 mg/dL (ref 8.4–10.5)
Chloride: 101 meq/L (ref 96–112)
Creatinine, Ser: 0.71 mg/dL (ref 0.40–1.20)
GFR: 85.67 mL/min (ref >60.00)
Glucose, Bld: 85 mg/dL (ref 70–99)
Potassium: 4 meq/L (ref 3.5–5.1)
Sodium: 139 meq/L (ref 135–145)
Total Bilirubin: 1.1 mg/dL (ref 0.2–1.2)
Total Protein: 7.1 g/dL (ref 6.0–8.3)

## 2024-06-25 LAB — CBC WITH DIFFERENTIAL/PLATELET
Basophils Absolute: 0 K/uL (ref 0.0–0.1)
Basophils Relative: 1 % (ref 0.0–3.0)
Eosinophils Absolute: 0.1 K/uL (ref 0.0–0.7)
Eosinophils Relative: 2.5 % (ref 0.0–5.0)
HCT: 41.6 % (ref 36.0–46.0)
Hemoglobin: 14 g/dL (ref 12.0–15.0)
Lymphocytes Relative: 42.9 % (ref 12.0–46.0)
Lymphs Abs: 1.3 K/uL (ref 0.7–4.0)
MCHC: 33.6 g/dL (ref 30.0–36.0)
MCV: 91 fl (ref 78.0–100.0)
Monocytes Absolute: 0.3 K/uL (ref 0.1–1.0)
Monocytes Relative: 10.1 % (ref 3.0–12.0)
Neutro Abs: 1.3 K/uL — ABNORMAL LOW (ref 1.4–7.7)
Neutrophils Relative %: 43.5 % (ref 43.0–77.0)
Platelets: 237 K/uL (ref 150.0–400.0)
RBC: 4.57 Mil/uL (ref 3.87–5.11)
RDW: 13.9 % (ref 11.5–15.5)
WBC: 3 K/uL — ABNORMAL LOW (ref 4.0–10.5)

## 2024-06-25 LAB — HEMOGLOBIN A1C: Hgb A1c MFr Bld: 5.8 % (ref 4.6–6.5)

## 2024-06-25 LAB — TSH: TSH: 1.03 u[IU]/mL (ref 0.35–5.50)

## 2024-06-25 LAB — VITAMIN D 25 HYDROXY (VIT D DEFICIENCY, FRACTURES): VITD: 35.52 ng/mL (ref 30.00–100.00)

## 2024-06-25 MED ORDER — ROSUVASTATIN CALCIUM 20 MG PO TABS: ORAL_TABLET | ORAL | 3 refills | Status: AC

## 2024-06-25 NOTE — Assessment & Plan Note (Signed)
 Chronic Check a1c Low sugar / carb diet Stressed regular exercise

## 2024-06-25 NOTE — Assessment & Plan Note (Signed)
 Chronic Was off medication for a while - lost prescription Heart healthy diet encouraged Check lipid panel, CMP Restart Crestor  20 mg daily

## 2024-06-25 NOTE — Assessment & Plan Note (Signed)
 Chronic DEXA up to date walking Continue calcium  and vitamin D  daily Check vitamin D  level

## 2024-06-25 NOTE — Assessment & Plan Note (Signed)
Chronic Intermittent Controlled Continue alprazolam 0.25 mg daily as needed for anxiety

## 2024-06-25 NOTE — Assessment & Plan Note (Signed)
 Chronic Taking vitamin D daily Check vitamin D level

## 2024-06-26 ENCOUNTER — Ambulatory Visit: Payer: Self-pay | Admitting: Internal Medicine

## 2024-06-26 ENCOUNTER — Telehealth: Payer: Self-pay | Admitting: Internal Medicine

## 2024-06-26 DIAGNOSIS — R0789 Other chest pain: Secondary | ICD-10-CM

## 2024-06-26 NOTE — Telephone Encounter (Signed)
 Pt message to Dr Geofm. I've had 2 more episodes this morning and I need to know what's going on. I did blood work yesterday and if there is anything I need to know can I please receive a call.I may need my Dr. to refer me to a HEART DR.  Please advise, Thanks

## 2024-06-27 NOTE — Telephone Encounter (Signed)
 I ordered a referral for cardiology

## 2024-06-27 NOTE — Telephone Encounter (Signed)
 Spoke with patient today.

## 2024-07-10 ENCOUNTER — Encounter (HOSPITAL_BASED_OUTPATIENT_CLINIC_OR_DEPARTMENT_OTHER): Payer: Self-pay

## 2024-07-11 NOTE — Progress Notes (Signed)
 Cardiology Office Note    Patient Name: Lori [name] Date of Encounter: 07/11/2024  Primary Care Provider:  Geofm Glade PARAS, MD Primary Cardiologist:  Oneil Parchment, MD Primary Electrophysiologist: None   Past Medical History    Past Medical History:  Diagnosis Date   Acute pain of left shoulder 07/03/2019   Arthritis of right knee 07/24/2019   Atypical chest pain 11/14/2018   Chronic pain of right knee 05/22/2019   CTS (carpal tunnel syndrome)    chronic   Depression 01/16/2013   Family history of colon cancer 06/06/2014   Headache 06/28/2019   History of colon polyps    Hyperglycemia 05/08/2018   Hyperlipidemia 04/27/2017   Left arm numbness 11/21/2017   Left wrist pain 07/03/2019   Osteopenia    Personal history of colonic polyps 06/06/2014   PONV (postoperative nausea and vomiting)    Pyelonephritis 05/02/2017   Stress at home 11/14/2018   stress w/dtr who has ovarian cancer 1/20   Tubular adenoma    UTI (urinary tract infection)    Vertigo 04/17/2018    History of Present Illness  Lori [name]  is a 73 year old female with a PMH of HLD, tobacco abuse, aortic atherosclerosis nonobstructive CAD atherosclerosis who presents today for 1 year follow-up.  Lori [name] was last seen on 06/08/2023 for preoperative clearance for left total knee arthroplasty.  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   Patient denies chest pain, palpitations, dyspnea, PND, orthopnea, nausea, vomiting, dizziness, syncope, edema, weight gain, or early satiety.   Discussed the use of AI scribe software for clinical note transcription with the patient, who gave verbal consent to proceed.  History of Present Illness    ***Notes:   Review of Systems  Please see the history of present illness.    All other systems reviewed and are otherwise negative except as noted above.  Physical Exam    Wt Readings from Last 3 Encounters:  06/25/24 170  lb (77.1 kg)  05/23/24 167 lb (75.8 kg)  02/21/24 167 lb (75.8 kg)   CD:Uyzmz were no vitals filed for this visit.,There is no height or weight on file to calculate BMI. GEN: Well nourished, well developed in no acute distress Neck: No JVD; No carotid bruits Pulmonary: Clear to auscultation without rales, wheezing or rhonchi  Cardiovascular: Normal rate. Regular rhythm. Normal S1. Normal S2.   Murmurs: There is no murmur.  ABDOMEN: Soft, non-tender, non-distended EXTREMITIES:  No edema; No deformity   EKG/LABS/ Recent Cardiac Studies   ECG personally reviewed by me today - ***  Risk Assessment/Calculations:   {Does this patient have ATRIAL FIBRILLATION?:[id number]}      Lab Results  Component Value Date   WBC 3.0 (L) 06/25/2024   HGB 14.0 06/25/2024   HCT 41.6 06/25/2024   MCV 91.0 06/25/2024   PLT 237.0 06/25/2024   Lab Results  Component Value Date   CREATININE 0.71 06/25/2024   BUN 12 06/25/2024   NA 139 06/25/2024   K 4.0 06/25/2024   CL 101 06/25/2024   CO2 30 06/25/2024   Lab Results  Component Value Date   CHOL 266 (H) 06/25/2024   HDL 80.20 06/25/2024   LDLCALC 166 (H) 06/25/2024   TRIG 96.0 06/25/2024   CHOLHDL 3 06/25/2024    Lab Results  Component Value Date   HGBA1C 5.8 06/25/2024   Assessment & Plan    Assessment  and Plan Assessment & Plan     1.  Nonobstructive CAD  2.  Aortic atherosclerosis  3.  Mixed hyperlipidemia  4.***      Disposition: Follow-up with Oneil Parchment, MD or APP in *** months {Are you ordering a CV Procedure (e.g. stress test, cath, DCCV, TEE, etc)?   Press F2        :[id number]}   Signed, Lori [name], Jackee Shove, NP 07/11/2024, 6:32 PM [facility]

## 2024-07-12 ENCOUNTER — Ambulatory Visit: Attending: Nurse Practitioner | Admitting: Nurse Practitioner

## 2024-07-12 ENCOUNTER — Encounter: Payer: Self-pay | Admitting: Nurse Practitioner

## 2024-07-12 VITALS — BP 120/70 | HR 71 | Ht 64.0 in | Wt 172.0 lb

## 2024-07-12 DIAGNOSIS — I700 Atherosclerosis of aorta: Secondary | ICD-10-CM | POA: Diagnosis not present

## 2024-07-12 DIAGNOSIS — E785 Hyperlipidemia, unspecified: Secondary | ICD-10-CM

## 2024-07-12 DIAGNOSIS — I2510 Atherosclerotic heart disease of native coronary artery without angina pectoris: Secondary | ICD-10-CM

## 2024-07-12 DIAGNOSIS — R0789 Other chest pain: Secondary | ICD-10-CM | POA: Diagnosis not present

## 2024-07-12 NOTE — Patient Instructions (Signed)
 Medication Instructions:  Your physician recommends that you continue on your current medications as directed. Please refer to the Current Medication list given to you today. *If you need a refill on your cardiac medications before your next appointment, please call your pharmacy*  Lab Work: None ordered If you have labs (blood work) drawn today and your tests are completely normal, you will receive your results only by: MyChart Message (if you have MyChart) OR A paper copy in the mail If you have any lab test that is abnormal or we need to change your treatment, we will call you to review the results.  Testing/Procedures: Your physician has requested that you have a lexiscan myoview. For further information please visit [email or web address] Please follow instruction sheet, as given.  Your physician has requested that you have an echocardiogram. Echocardiography is a painless test that uses sound waves to create images of your heart. It provides your doctor with information about the size and shape of your heart and how well your heart's chambers and valves are working. This procedure takes approximately one hour. There are no restrictions for this procedure. Please do NOT wear cologne, perfume, aftershave, or lotions (deodorant is allowed). Please arrive 15 minutes prior to your appointment time.  Please note: We ask at that you not bring children with you during ultrasound (echo/ vascular) testing. Due to room size and safety concerns, children are not allowed in the ultrasound rooms during exams. Our front office staff cannot provide observation of children in our lobby area while testing is being conducted. An adult accompanying a patient to their appointment will only be allowed in the ultrasound room at the discretion of the ultrasound technician under special circumstances. We apologize for any inconvenience.   Follow-Up: At [facility], you and your health needs are our  priority.  As part of our continuing mission to provide you with exceptional heart care, our providers are all part of one team.  This team includes your primary Cardiologist (physician) and Advanced Practice Providers or APPs (Physician Assistants and Nurse Practitioners) who all work together to provide you with the care you need, when you need it.  Your next appointment:   12 month(s)  Provider:   Oneil Parchment, MD    We recommend signing up for the patient portal called MyChart.  Sign up information is provided on this After Visit Summary.  MyChart is used to connect with patients for Virtual Visits (Telemedicine).  Patients are able to view lab/test results, encounter notes, upcoming appointments, etc.  Non-urgent messages can be sent to your provider as well.   To learn more about what you can do with MyChart, go to [email or web address].   Other Instructions

## 2024-07-16 DIAGNOSIS — M1712 Unilateral primary osteoarthritis, left knee: Secondary | ICD-10-CM | POA: Diagnosis not present

## 2024-07-16 DIAGNOSIS — M170 Bilateral primary osteoarthritis of knee: Secondary | ICD-10-CM | POA: Diagnosis not present

## 2024-07-22 ENCOUNTER — Ambulatory Visit (INDEPENDENT_AMBULATORY_CARE_PROVIDER_SITE_OTHER): Admitting: Internal Medicine

## 2024-07-22 ENCOUNTER — Encounter: Payer: Self-pay | Admitting: Internal Medicine

## 2024-07-22 VITALS — BP 140/72 | HR 72 | Temp 98.4°F | Ht 65.75 in | Wt 175.8 lb

## 2024-07-22 DIAGNOSIS — J029 Acute pharyngitis, unspecified: Secondary | ICD-10-CM | POA: Diagnosis not present

## 2024-07-22 LAB — POCT RAPID STREP A (OFFICE): Rapid Strep A Screen: NEGATIVE

## 2024-07-22 MED ORDER — AZITHROMYCIN 250 MG PO TABS: ORAL_TABLET | ORAL | 0 refills | Status: DC

## 2024-07-22 NOTE — Assessment & Plan Note (Signed)
 Likely viral  Rapid POC strep (-) Zpack if worse (pt is leaving on a trip up [place] in 5 days)

## 2024-07-22 NOTE — Progress Notes (Signed)
 Subjective:  Patient ID: Lori [name], female    DOB: 14-Feb-1951  Age: 73 y.o. MRN: [id number]  CC: Sore Throat (Just happened this morning. Tylenol  helped  a bit. )   HPI Lori [name] presents for ST this am Leaving on a trip up [place] in 5 days  Outpatient Medications Prior to Visit  Medication Sig Dispense Refill   Ascorbic Acid (VITAMIN C) 500 MG CHEW Chew 500 mg by mouth daily.     b complex vitamins capsule Take 1 capsule by mouth daily.     Calcium  Carbonate-Vitamin D  600-400 MG-UNIT tablet Take 1 tablet by mouth daily.     Cholecalciferol (VITAMIN D3) 50 MCG (2000 UT) TABS Take 2,000 Units by mouth daily.      clindamycin (CLEOCIN) 150 MG capsule Take 2 tabs by mouth 1 hour prior to dental appts     COD LIVER OIL PO Take 1 capsule by mouth daily.      conjugated estrogens (PREMARIN) vaginal cream Place 1 Applicatorful vaginally daily as needed (irritation).     docusate sodium  (COLACE) 100 MG capsule Take 1 capsule (100 mg total) by mouth 2 (two) times daily. 30 capsule 0   Ginkgo Biloba 60 MG CAPS Take 60 mg by mouth daily.      Misc Natural Products (TART CHERRY ADVANCED) CAPS Take 1 capsule by mouth daily.      Multiple Vitamins-Minerals (ONE-A-DAY WOMENS 50 PLUS PO) Take 1 tablet by mouth daily.     pyridOXINE (VITAMIN B-6) 100 MG tablet Take 100 mg by mouth daily.     Rhubarb (ESTROVEN COMPLETE PO) Take 1 tablet by mouth daily.      rosuvastatin  (CRESTOR ) 20 MG tablet TAKE 1 TABLET(20 MG) BY MOUTH DAILY 90 tablet 3   Turmeric (QC TUMERIC COMPLEX PO) Take 1 tablet by mouth daily.      vitamin B-12 (CYANOCOBALAMIN) 500 MCG tablet Take 500 mcg by mouth daily.     ALPRAZolam  (XANAX ) 0.25 MG tablet Take 1 tablet (0.25 mg total) by mouth daily as needed for anxiety. (Patient not taking: Reported on 07/22/2024) 30 tablet 0   No facility-administered medications prior to visit.    ROS: Review of Systems  Constitutional:  Negative for chills, fatigue and fever.  HENT:   Positive for sore throat. Negative for ear pain, sinus pressure, sinus pain and trouble swallowing.   Eyes:  Negative for redness.  Respiratory:  Negative for cough and wheezing.   Cardiovascular:  Negative for chest pain.    Objective:  BP (!) 140/72   Pulse 72   Temp 98.4 F (36.9 C) (Oral)   Ht 5' 5.75 (1.67 m)   Wt 175 lb 12.8 oz (79.7 kg)   SpO2 98%   BMI 28.59 kg/m   BP Readings from Last 3 Encounters:  07/22/24 (!) 140/72  07/12/24 120/70  06/25/24 132/80    Wt Readings from Last 3 Encounters:  07/22/24 175 lb 12.8 oz (79.7 kg)  07/12/24 172 lb (78 kg)  06/25/24 170 lb (77.1 kg)    Physical Exam Constitutional:      General: She is not in acute distress.    Appearance: She is well-developed.  HENT:     Head: Normocephalic.     Right Ear: External ear normal.     Left Ear: External ear normal.     Nose: Nose normal.     Mouth/Throat:     Pharynx: Posterior oropharyngeal erythema present. No oropharyngeal exudate.  Eyes:     General:        Right eye: No discharge.        Left eye: No discharge.     Conjunctiva/sclera: Conjunctivae normal.     Pupils: Pupils are equal, round, and reactive to light.  Neck:     Thyroid : No thyromegaly.     Vascular: No JVD.     Trachea: No tracheal deviation.  Cardiovascular:     Rate and Rhythm: Normal rate and regular rhythm.     Heart sounds: Normal heart sounds.  Pulmonary:     Effort: No respiratory distress.     Breath sounds: No stridor. No wheezing.  Abdominal:     General: Bowel sounds are normal. There is no distension.     Palpations: Abdomen is soft. There is no mass.     Tenderness: There is no abdominal tenderness. There is no guarding or rebound.  Musculoskeletal:        General: No tenderness.     Cervical back: Normal range of motion and neck supple. No rigidity.  Lymphadenopathy:     Cervical: No cervical adenopathy.  Skin:    Findings: No erythema or rash.  Neurological:     Cranial Nerves: No  cranial nerve deficit.     Motor: No abnormal muscle tone.     Coordination: Coordination normal.     Deep Tendon Reflexes: Reflexes normal.  Psychiatric:        Behavior: Behavior normal.        Thought Content: Thought content normal.        Judgment: Judgment normal.     Lab Results  Component Value Date   WBC 3.0 (L) 06/25/2024   HGB 14.0 06/25/2024   HCT 41.6 06/25/2024   PLT 237.0 06/25/2024   GLUCOSE 85 06/25/2024   CHOL 266 (H) 06/25/2024   TRIG 96.0 06/25/2024   HDL 80.20 06/25/2024   LDLCALC 166 (H) 06/25/2024   ALT 15 06/25/2024   AST 21 06/25/2024   NA 139 06/25/2024   K 4.0 06/25/2024   CL 101 06/25/2024   CREATININE 0.71 06/25/2024   BUN 12 06/25/2024   CO2 30 06/25/2024   TSH 1.03 06/25/2024   HGBA1C 5.8 06/25/2024    DG Bone Density Result Date: 10/31/2023 Table formatting from the original result was not included. Date of study: 10/31/2023 Exam: DUAL X-RAY ABSORPTIOMETRY (DXA) FOR BONE MINERAL DENSITY (BMD) Instrument: [employer] Requesting Provider: PCP Indication: follow up for low BMD Comparison: 06/21/2016 Clinical data: Pt is a 73 y.o. female without previous history of fracture. On calcium  and vitamin D . Results:  Lumbar spine L1-L4 Femoral neck (FN) 33% distal radius T-score +0.3 RFN: -1.0 LFN: -2.3 n/a Change in BMD from previous DXA test (%) n/a +1.0% n/a (*) statistically significant Assessment: the BMD is low according to the [facility] classification for osteoporosis (see below). Fracture risk: moderate FRAX score: 10 year major osteoporotic risk: 6.1%. 10 year hip fracture risk: 1.4%. The thresholds for treatment are 20% and 3%, respectively. Comments: the technical quality of the study is good. Evaluation for secondary causes should be considered if clinically indicated. Recommend optimizing calcium  (1200 mg/day) and vitamin D  (800 IU/day) intake. Followup: Repeat BMD is appropriate after 2 years. WHO criteria for diagnosis of osteoporosis in  postmenopausal women and in men 88 y/o or older: - normal: T-score -1.0 to + 1.0 - osteopenia/low bone density: T-score between -2.5 and -1.0 - osteoporosis: T-score below -2.5 - severe  osteoporosis: T-score below -2.5 with history of fragility fracture Note: although not part of the WHO classification, the presence of a fragility fracture, regardless of the T-score, should be considered diagnostic of osteoporosis, provided other causes for the fracture have been excluded. Treatment: The National Osteoporosis Foundation recommends that treatment be considered in postmenopausal women and men age 28 or older with: 1. Hip or vertebral (clinical or morphometric) fracture 2. T-score of - 2.5 or lower at the spine or hip 3. 10-year fracture probability by FRAX of at least 20% for a major osteoporotic fracture and 3% for a hip fracture Lela Fendt, MD [facility]    Assessment & Plan:   Problem List Items Addressed This Visit     Sore throat - Primary   Likely viral  Rapid POC strep (-) Zpack if worse (pt is leaving on a trip up [place] in 5 days)       Relevant Orders   POCT rapid strep A (Completed)      Meds ordered this encounter  Medications   azithromycin  (ZITHROMAX  Z-PAK) 250 MG tablet    Sig: As directed    Dispense:  6 tablet    Refill:  0      Follow-up: Return for f/u with PCP.  Marolyn Noel, MD

## 2024-07-25 ENCOUNTER — Telehealth: Payer: Self-pay

## 2024-07-25 NOTE — Telephone Encounter (Signed)
 Copied from CRM #[id number]. Topic: Clinical - Medication Question >> Jul 25, 2024 10:17 AM Harlene ORN wrote: Reason for CRM: Patient called. Was just seen by Dr. Garald on 09/29 for sore throat. Was tested for Strep and it came back negative. Now after seen Dr. Garald, she now has a severe cough, but no sore throat. Wants to either see her PCP today for treatment, or please have her PCP prescribe her a cough medicine that will help her coughing before she goes on her 10 day trip. Please call back asap.

## 2024-07-29 MED ORDER — PROMETHAZINE-DM 6.25-15 MG/5ML PO SYRP: 5.0000 mL | ORAL_SOLUTION | Freq: Four times a day (QID) | ORAL | 0 refills | Status: DC | PRN

## 2024-07-29 NOTE — Telephone Encounter (Signed)
 I will send a prescription in. Office visit with any provider if problems

## 2024-08-01 ENCOUNTER — Telehealth: Payer: Self-pay

## 2024-08-01 NOTE — Telephone Encounter (Signed)
 Addressed already

## 2024-08-01 NOTE — Telephone Encounter (Signed)
 Copied from CRM [id number]. Topic: Clinical - Medication Question >> Jul 26, 2024 10:28 AM Alfonso HERO wrote: Reason for CRM: Patient called  for the status of the cough medication req. Can you please give her a call when you have free time?

## 2024-08-13 ENCOUNTER — Telehealth (HOSPITAL_COMMUNITY): Payer: Self-pay | Admitting: *Deleted

## 2024-08-13 NOTE — Telephone Encounter (Signed)
 Patient given detailed instructions per Myocardial Perfusion Study Information Sheet for the test on 08/21/24  Patient notified to arrive 15 minutes early and that it is imperative to arrive on time for appointment to keep from having the test rescheduled.  If you need to cancel or reschedule your appointment, please call the office within 24 hours of your appointment. . Patient verbalized understanding.Claudene Ronal Quale, RN

## 2024-08-14 ENCOUNTER — Other Ambulatory Visit: Payer: Self-pay | Admitting: Nurse Practitioner

## 2024-08-14 DIAGNOSIS — E785 Hyperlipidemia, unspecified: Secondary | ICD-10-CM

## 2024-08-14 DIAGNOSIS — I2510 Atherosclerotic heart disease of native coronary artery without angina pectoris: Secondary | ICD-10-CM

## 2024-08-14 DIAGNOSIS — I700 Atherosclerosis of aorta: Secondary | ICD-10-CM

## 2024-08-14 DIAGNOSIS — R0789 Other chest pain: Secondary | ICD-10-CM

## 2024-08-21 ENCOUNTER — Ambulatory Visit: Payer: Self-pay | Admitting: Nurse Practitioner

## 2024-08-21 ENCOUNTER — Ambulatory Visit (HOSPITAL_COMMUNITY)
Admission: RE | Admit: 2024-08-21 | Discharge: 2024-08-21 | Disposition: A | Discharge status: Home / Self Care | Source: Ambulatory Visit | Attending: Cardiology | Admitting: Cardiology

## 2024-08-21 ENCOUNTER — Ambulatory Visit (HOSPITAL_BASED_OUTPATIENT_CLINIC_OR_DEPARTMENT_OTHER)
Admission: RE | Admit: 2024-08-21 | Discharge: 2024-08-21 | Disposition: A | Discharge status: Home / Self Care | Source: Ambulatory Visit | Attending: Nurse Practitioner | Admitting: Nurse Practitioner

## 2024-08-21 DIAGNOSIS — I700 Atherosclerosis of aorta: Secondary | ICD-10-CM | POA: Diagnosis present

## 2024-08-21 DIAGNOSIS — R0789 Other chest pain: Secondary | ICD-10-CM | POA: Insufficient documentation

## 2024-08-21 DIAGNOSIS — E785 Hyperlipidemia, unspecified: Secondary | ICD-10-CM | POA: Insufficient documentation

## 2024-08-21 DIAGNOSIS — I2510 Atherosclerotic heart disease of native coronary artery without angina pectoris: Secondary | ICD-10-CM | POA: Insufficient documentation

## 2024-08-21 DIAGNOSIS — Z9049 Acquired absence of other specified parts of digestive tract: Secondary | ICD-10-CM | POA: Diagnosis not present

## 2024-08-21 DIAGNOSIS — M47814 Spondylosis without myelopathy or radiculopathy, thoracic region: Secondary | ICD-10-CM | POA: Diagnosis not present

## 2024-08-21 LAB — MYOCARDIAL PERFUSION IMAGING
Base ST Depression (mm): 0 mm
LV dias vol: 79 mL (ref 46–106)
LV sys vol: 32 mL (ref 3.8–5.2)
Nuc Stress EF: 59 %
Peak HR: 101 {beats}/min
Rest HR: 67 {beats}/min
Rest Nuclear Isotope Dose: 11 mCi
SDS: 0
SRS: 2
SSS: 1
ST Depression (mm): 0 mm
Stress Nuclear Isotope Dose: 30.6 mCi
TID: 0.92

## 2024-08-21 LAB — ECHOCARDIOGRAM COMPLETE
AR max vel: 2.2 cm2
AV Area VTI: 1.92 cm2
AV Area mean vel: 2.1 cm2
AV Mean grad: 3 mmHg
AV Peak grad: 6.2 mmHg
Ao pk vel: 1.24 m/s
Area-P 1/2: 3.74 cm2
S' Lateral: 2.3 cm

## 2024-08-21 MED ORDER — TECHNETIUM TC 99M TETROFOSMIN IV KIT
30.6000 | PACK | Freq: Once | INTRAVENOUS | Status: AC | PRN
  Administered 2024-08-21: 30.6 via INTRAVENOUS

## 2024-08-21 MED ORDER — REGADENOSON 0.4 MG/5ML IV SOLN
0.4000 mg | Freq: Once | INTRAVENOUS | Status: AC
  Administered 2024-08-21: 0.4 mg via INTRAVENOUS

## 2024-08-21 MED ORDER — REGADENOSON 0.4 MG/5ML IV SOLN
INTRAVENOUS | Status: AC
  Filled 2024-08-21: qty 5

## 2024-08-21 MED ORDER — TECHNETIUM TC 99M TETROFOSMIN IV KIT
11.0000 | PACK | Freq: Once | INTRAVENOUS | Status: AC | PRN
  Administered 2024-08-21: 11 via INTRAVENOUS

## 2024-10-22 ENCOUNTER — Ambulatory Visit: Payer: Self-pay | Admitting: *Deleted

## 2024-10-22 ENCOUNTER — Emergency Department (HOSPITAL_BASED_OUTPATIENT_CLINIC_OR_DEPARTMENT_OTHER)
Admission: EM | Admit: 2024-10-22 | Discharge: 2024-10-22 | Disposition: A | Discharge status: Home / Self Care | Source: Ambulatory Visit

## 2024-10-22 ENCOUNTER — Other Ambulatory Visit: Payer: Self-pay

## 2024-10-22 DIAGNOSIS — M79604 Pain in right leg: Secondary | ICD-10-CM

## 2024-10-22 DIAGNOSIS — M79661 Pain in right lower leg: Secondary | ICD-10-CM | POA: Insufficient documentation

## 2024-10-22 DIAGNOSIS — M7989 Other specified soft tissue disorders: Secondary | ICD-10-CM | POA: Insufficient documentation

## 2024-10-22 NOTE — ED Triage Notes (Signed)
 Patient states pain down right leg x 4 days. Pain to right thigh, calf, and knee. Denies swelling or redness.

## 2024-10-22 NOTE — Telephone Encounter (Signed)
" °  FYI Only or Action Required?: Action required by provider: request for appointment, clinical question for provider, and recommended UC or ED.  Patient was last seen in primary care on 07/22/2024 by Plotnikov, Karlynn GAILS, MD.  Called Nurse Triage reporting Leg Pain.  Symptoms began several days ago.  Interventions attempted: Nothing.  Symptoms are: gradually worsening.  Triage Disposition: See HCP Within 4 Hours (Or PCP Triage)  Patient/caregiver understands and will follow disposition?: Yes    No available appt today. Recommended UC or ED today . Patient wants to see PCP                Copied from CRM [id number]. Topic: Clinical - Red Word Triage >> Oct 22, 2024 11:15 AM Viola FALCON wrote: Red Word that prompted transfer to Nurse Triage: Patient having sever pain in the thigh,leg, and knee Reason for Disposition  [1] Thigh or calf pain AND [2] only 1 side AND [3] present > 1 hour  (Exception: Chronic unchanged pain.)  Answer Assessment - Initial Assessment Questions No available OV today with any provider. Recommended UC or ED for evaluation.      1. ONSET: When did the pain start?      10/18/24 Friday  2. LOCATION: Where is the pain located?      Right leg thigh, knee 3. PAIN: How bad is the pain?    (Scale 1-10; or mild, moderate, severe)     Severe at times and then goes away 4. WORK OR EXERCISE: Has there been any recent work or exercise that involved this part of the body?      No  5. CAUSE: What do you think is causing the leg pain?     Not sure  6. OTHER SYMPTOMS: Do you have any other symptoms? (e.g., chest pain, back pain, breathing difficulty, swelling, rash, fever, numbness, weakness)     Right inner thigh, knee and leg. Sharp intense pain hours at a time. Radiates to calf  but more around knee. 7. PREGNANCY: Is there any chance you are pregnant? When was your last menstrual period?     na  Protocols used: Leg Pain-A-AH  "

## 2024-10-22 NOTE — ED Provider Notes (Signed)
 " [facility] AT [facility] Provider Note   CSN: [id number] Arrival date & time: 10/22/24  1316     Patient presents with: Leg Pain   DANALEE FLATH is a 73 y.o. female.   73 year old female presents for evaluation of pain.  She states she does not have any swelling.  States a few days ago she noticed some pain in her right thigh and now radiates down to her ankle and behind her calf.  States it comes and goes and gets worse in the morning when she wakes up but goes away at nighttime.  Has taken Tylenol  as needed.  She denies any other symptoms or concerns at this time.   Leg Pain Associated symptoms: no back pain and no fever        Prior to Admission medications  Medication Sig Start Date End Date Taking? Authorizing Provider  ALPRAZolam  (XANAX ) 0.25 MG tablet Take 1 tablet (0.25 mg total) by mouth daily as needed for anxiety. Patient not taking: Reported on 07/22/2024 04/13/23   Geofm Glade PARAS, MD  Ascorbic Acid (VITAMIN C) 500 MG CHEW Chew 500 mg by mouth daily.    [provider]  azithromycin  (ZITHROMAX  Z-PAK) 250 MG tablet As directed 07/22/24   Plotnikov, Aleksei V, MD  b complex vitamins capsule Take 1 capsule by mouth daily. 05/22/19   Geofm Glade PARAS, MD  Calcium  Carbonate-Vitamin D  600-400 MG-UNIT tablet Take 1 tablet by mouth daily. 05/22/19   Geofm Glade PARAS, MD  Cholecalciferol (VITAMIN D3) 50 MCG (2000 UT) TABS Take 2,000 Units by mouth daily.     [provider]  clindamycin (CLEOCIN) 150 MG capsule Take 2 tabs by mouth 1 hour prior to dental appts    [provider]  COD LIVER OIL PO Take 1 capsule by mouth daily.     [provider]  conjugated estrogens (PREMARIN) vaginal cream Place 1 Applicatorful vaginally daily as needed (irritation).    [provider]  docusate sodium  (COLACE) 100 MG capsule Take 1 capsule (100 mg total) by mouth 2 (two) times daily. 07/17/23   Rondall Agent, PA-C  Ginkgo  Biloba 60 MG CAPS Take 60 mg by mouth daily.     [provider]  Misc Natural Products (TART CHERRY ADVANCED) CAPS Take 1 capsule by mouth daily.     [provider]  Multiple Vitamins-Minerals (ONE-A-DAY WOMENS 50 PLUS PO) Take 1 tablet by mouth daily.    [provider]  promethazine -dextromethorphan (PROMETHAZINE -DM) 6.25-15 MG/5ML syrup Take 5 mLs by mouth 4 (four) times daily as needed for cough. 07/29/24   Plotnikov, Aleksei V, MD  pyridOXINE (VITAMIN B-6) 100 MG tablet Take 100 mg by mouth daily.    [provider]  Rhubarb (ESTROVEN COMPLETE PO) Take 1 tablet by mouth daily.     [provider]  rosuvastatin  (CRESTOR ) 20 MG tablet TAKE 1 TABLET(20 MG) BY MOUTH DAILY 06/25/24   Burns, Glade PARAS, MD  Turmeric (QC TUMERIC COMPLEX PO) Take 1 tablet by mouth daily.     [provider]  vitamin B-12 (CYANOCOBALAMIN) 500 MCG tablet Take 500 mcg by mouth daily.    [provider]    Allergies: Codeine, Doxycycline , Hycodan [hydrocodone  bit-homatrop mbr], Other, Penicillins, and Tramadol     Review of Systems  Constitutional:  Negative for chills and fever.  HENT:  Negative for ear pain and sore throat.   Eyes:  Negative for pain and visual disturbance.  Respiratory:  Negative for cough and shortness of breath.   Cardiovascular:  Negative for chest pain and palpitations.  Gastrointestinal:  Negative for abdominal pain and vomiting.  Genitourinary:  Negative for dysuria and hematuria.  Musculoskeletal:  Negative for arthralgias and back pain.       Leg pain  Skin:  Negative for color change and rash.  Neurological:  Negative for seizures and syncope.  All other systems reviewed and are negative.   Updated Vital Signs BP (!) 161/87 (BP Location: Right Arm)   Pulse (!) 101   Temp 98.3 F (36.8 C) (Oral)   Resp 19   SpO2 100%   Physical Exam Vitals and nursing note reviewed.  Constitutional:      General: She is not in acute  distress.    Appearance: Normal appearance. She is well-developed. She is not ill-appearing.  HENT:     Head: Normocephalic and atraumatic.  Eyes:     Conjunctiva/sclera: Conjunctivae normal.  Cardiovascular:     Rate and Rhythm: Normal rate and regular rhythm.     Heart sounds: No murmur heard. Pulmonary:     Effort: Pulmonary effort is normal. No respiratory distress.     Breath sounds: Normal breath sounds.  Abdominal:     Palpations: Abdomen is soft.     Tenderness: There is no abdominal tenderness.  Musculoskeletal:        General: No swelling, tenderness or deformity. Normal range of motion.     Cervical back: Neck supple.  Skin:    General: Skin is warm and dry.     Capillary Refill: Capillary refill takes less than 2 seconds.  Neurological:     Mental Status: She is alert.  Psychiatric:        Mood and Affect: Mood normal.     (all labs ordered are listed, but only abnormal results are displayed) Labs Reviewed - No data to display  EKG: None  Radiology: No results found.   Procedures   Medications Ordered in the ED - No data to display                                  Medical Decision Making Patient here for right leg pain, there is no swelling she has a full range of motion of her leg.  No erythema or tenderness to palpation.  Unfortunately ultrasound is gone for the day but I did give her a referral to come back tomorrow morning for ultrasound.  Doubt DVT at this time.  She declined any pain medications while in the ER.  Advised Tylenol  Motrin as needed and otherwise return for new or worsening symptoms and follow-up with primary care doctor in 1 to 2 weeks.  She feels comfortable being discharged home.  Problems Addressed: Pain of right lower extremity: acute illness or injury  Amount and/or Complexity of Data Reviewed External Data Reviewed: notes.    Details: No prior ED records for review Radiology: ordered.  Risk OTC drugs. Prescription drug  management.     Final diagnoses:  Pain of right lower extremity    ED Discharge Orders          Ordered    UE VENOUS DUPLEX        10/22/24 1737    VAS US  LOWER EXTREMITY VENOUS (DVT)        10/22/24 1738  Gennaro Duwaine CROME, DO 10/22/24 1743  "

## 2024-10-22 NOTE — Discharge Instructions (Signed)
 Return tomorrow morning for an ultrasound of your right lower extremity.  You can take Tylenol  and Motrin as needed for pain.

## 2024-10-23 ENCOUNTER — Ambulatory Visit (HOSPITAL_BASED_OUTPATIENT_CLINIC_OR_DEPARTMENT_OTHER)
Admission: RE | Admit: 2024-10-23 | Discharge: 2024-10-23 | Disposition: A | Discharge status: Home / Self Care | Source: Ambulatory Visit | Attending: Emergency Medicine | Admitting: Emergency Medicine

## 2024-10-23 ENCOUNTER — Other Ambulatory Visit (HOSPITAL_BASED_OUTPATIENT_CLINIC_OR_DEPARTMENT_OTHER): Payer: Self-pay

## 2024-10-23 DIAGNOSIS — M79604 Pain in right leg: Secondary | ICD-10-CM

## 2024-10-23 NOTE — ED Provider Notes (Signed)
 10:24 AM patient return for DVT study today.  DVT was not seen.  Patient does have a Baker's cyst.  Patient was informed of her results.  She is comfortable with following up with her doctor.  She scheduled an appointment.   Desiderio Chew, PA-C 10/23/24 1024    Kammerer, Megan L, DO 10/23/24 [phone]

## 2024-11-01 ENCOUNTER — Encounter: Payer: Self-pay | Admitting: Internal Medicine

## 2024-11-01 ENCOUNTER — Ambulatory Visit: Admitting: Internal Medicine

## 2024-11-01 VITALS — BP 132/72 | HR 85 | Temp 98.4°F | Ht 65.75 in | Wt 176.0 lb

## 2024-11-01 DIAGNOSIS — M79604 Pain in right leg: Secondary | ICD-10-CM | POA: Diagnosis not present

## 2024-11-01 DIAGNOSIS — F419 Anxiety disorder, unspecified: Secondary | ICD-10-CM

## 2024-11-01 DIAGNOSIS — R030 Elevated blood-pressure reading, without diagnosis of hypertension: Secondary | ICD-10-CM

## 2024-11-01 NOTE — Patient Instructions (Addendum)
" ° °  Monitor your BP - ideally it should be less than 135/80 "

## 2024-11-01 NOTE — Assessment & Plan Note (Signed)
Chronic Intermittent Controlled Continue alprazolam 0.25 mg daily as needed for anxiety

## 2024-11-01 NOTE — Assessment & Plan Note (Signed)
 Acute Resolved Started having acute right leg pain from the medial inner thigh all the way down to her foot-most of the pain was in the proximal calf Started after wearing tight compression socks for several hours Ultrasound showed popliteal cyst-this could have ruptured which could be causing some of her symptoms She also may have had some neuralgia from the tight compression socks which seems more likely Pain has resolved so no further evaluation or treatment is needed Reassured her I do not think this will occur again-avoid tight compression socks in the future

## 2024-11-01 NOTE — Assessment & Plan Note (Signed)
 Subacute Has had a couple of elevated BP readings Recheck today Advised her to start monitoring her BP at home-goal less than 135/80 Follow-up at routine physical, sooner if BP is elevated

## 2024-11-01 NOTE — Progress Notes (Signed)
 "   Subjective:    Patient ID: Lori [name], female    DOB: [birth year], 74 y.o.   MRN: [id number]      HPI Lori [name] is here for  Chief Complaint  Patient presents with   Knee Pain    Right knee pain that started from her inside thigh down into her leg; Went to ED and was diagnosed with Bakers Cyst; ED follow up and leg better today    Intense pain.  Tingling or crawling sensation under right foot  Discussed the use of AI scribe software for clinical note transcription with the patient, who gave verbal consent to proceed.  History of Present Illness Lori [name] is a 74 year old female who presents with severe leg pain after wearing tight trouser socks.  She experienced severe leg pain that began on Christmas Day after wearing tight trouser socks. The pain was described as the 'worst pain ever,' radiating from the inside of her leg down to her foot and through the groin. It was intense and persistent, preventing sleep and daily activities.  She attempted self-care measures such as propping up her leg and applying ice packs, but these provided no relief. The pain persisted intensely over the holiday weekend, prompting her to seek medical attention. She contacted the office on Tuesday after the holiday weekend.  She visited a medical center where an ultrasound was performed, and she was told that she had a cyst. Despite experiencing significant delays in receiving care, she noticed improvement in her symptoms over the past few days, with today being her best day since the onset of pain. No swelling, weakness, or burning sensation in the leg, but she did experience tingling and a 'crawly' sensation in her foot.  She has been taking Tylenol  for pain management, which she states helped her survive the ordeal.        Medications and allergies reviewed with patient and updated if appropriate.  Medications Ordered Prior to Encounter[1]  Review of Systems     Objective:   Vitals:    11/01/24 0844  BP: (!) 140/78  Pulse: 85  Temp: 98.4 F (36.9 C)  SpO2: 97%   BP Readings from Last 3 Encounters:  11/01/24 (!) 140/78  10/22/24 (!) 155/86  07/22/24 (!) 140/72   Wt Readings from Last 3 Encounters:  11/01/24 176 lb (79.8 kg)  07/22/24 175 lb 12.8 oz (79.7 kg)  07/12/24 172 lb (78 kg)   Body mass index is 28.62 kg/m.    Physical Exam         Assessment & Plan:    See Problem List for Assessment and Plan of chronic medical problems.          [1]  Current Outpatient Medications on File Prior to Visit  Medication Sig Dispense Refill   ALPRAZolam  (XANAX ) 0.25 MG tablet Take 1 tablet (0.25 mg total) by mouth daily as needed for anxiety. 30 tablet 0   Ascorbic Acid (VITAMIN C) 500 MG CHEW Chew 500 mg by mouth daily.     b complex vitamins capsule Take 1 capsule by mouth daily.     Calcium  Carbonate-Vitamin D  600-400 MG-UNIT tablet Take 1 tablet by mouth daily.     Cholecalciferol (VITAMIN D3) 50 MCG (2000 UT) TABS Take 2,000 Units by mouth daily.      clindamycin (CLEOCIN) 150 MG capsule Take 2 tabs by mouth 1 hour prior to dental appts     COD LIVER OIL PO Take 1  capsule by mouth daily.      conjugated estrogens (PREMARIN) vaginal cream Place 1 Applicatorful vaginally daily as needed (irritation).     docusate sodium  (COLACE) 100 MG capsule Take 1 capsule (100 mg total) by mouth 2 (two) times daily. 30 capsule 0   Ginkgo Biloba 60 MG CAPS Take 60 mg by mouth daily.      Misc Natural Products (TART CHERRY ADVANCED) CAPS Take 1 capsule by mouth daily.      Multiple Vitamins-Minerals (ONE-A-DAY WOMENS 50 PLUS PO) Take 1 tablet by mouth daily.     promethazine -dextromethorphan (PROMETHAZINE -DM) 6.25-15 MG/5ML syrup Take 5 mLs by mouth 4 (four) times daily as needed for cough. 240 mL 0   pyridOXINE (VITAMIN B-6) 100 MG tablet Take 100 mg by mouth daily.     Rhubarb (ESTROVEN COMPLETE PO) Take 1 tablet by mouth daily.      rosuvastatin  (CRESTOR ) 20 MG  tablet TAKE 1 TABLET(20 MG) BY MOUTH DAILY 90 tablet 3   Turmeric (QC TUMERIC COMPLEX PO) Take 1 tablet by mouth daily.      vitamin B-12 (CYANOCOBALAMIN) 500 MCG tablet Take 500 mcg by mouth daily.     No current facility-administered medications on file prior to visit.   "

## 2024-11-08 ENCOUNTER — Encounter: Payer: Self-pay | Admitting: Internal Medicine

## 2024-11-08 ENCOUNTER — Ambulatory Visit (INDEPENDENT_AMBULATORY_CARE_PROVIDER_SITE_OTHER): Admitting: Internal Medicine

## 2024-11-08 VITALS — BP 130/78 | HR 90 | Temp 98.1°F | Ht 65.75 in | Wt 177.0 lb

## 2024-11-08 DIAGNOSIS — M79604 Pain in right leg: Secondary | ICD-10-CM | POA: Diagnosis not present

## 2024-11-08 NOTE — Progress Notes (Signed)
 "   Subjective:    Patient ID: Lori [name], female    DOB: 07/31/1951, 74 y.o.   MRN: [id number]      HPI Lori [name] is here for  Chief Complaint  Patient presents with   Leg Pain    Right leg pain still    Discussed the use of AI scribe software for clinical note transcription with the patient, who gave verbal consent to proceed.  History of Present Illness [name] is a 74 year old female who presents with pain in the inner part of her right knee.  She has been experiencing pain in the inner part of her right knee for the past week. Initially, the pain radiated down her leg but has since localized to the inner knee area. The pain is persistent, particularly during physical activity, causing swelling and impacting her daily activities.  The pain is described as a nuisance, worsening with movement and activity, and is present even at rest, such as when lying in bed. It is intense at night despite taking two Tylenol  before bed, but not as severe in the morning. No pain in the foot or leg, and no numbness, tingling, or weakness in the leg.  She denies back pain.  Currently, she takes Tylenol  for pain management.       Medications and allergies reviewed with patient and updated if appropriate.  Medications Ordered Prior to Encounter[1]  Review of Systems     Objective:   Vitals:   11/08/24 0801  BP: 130/78  Pulse: 90  Temp: 98.1 F (36.7 C)  SpO2: 98%   BP Readings from Last 3 Encounters:  11/08/24 130/78  11/01/24 132/72  10/22/24 (!) 155/86   Wt Readings from Last 3 Encounters:  11/08/24 177 lb (80.3 kg)  11/01/24 176 lb (79.8 kg)  07/22/24 175 lb 12.8 oz (79.7 kg)   Body mass index is 28.79 kg/m.    Physical Exam Constitutional:      General: She is not in acute distress.    Appearance: Normal appearance. She is not ill-appearing.  HENT:     Head: Normocephalic and atraumatic.  Musculoskeletal:        General: Tenderness (minimal in area of pes  anserine, no other knee pain, no calf or upper leg pain with palpation) present.     Right lower leg: Edema (trace) present.  Skin:    General: Skin is warm and dry.     Findings: No rash.  Neurological:     Mental Status: She is alert.     Sensory: No sensory deficit.     Motor: No weakness.            Assessment & Plan:    See Problem List for Assessment and Plan of chronic medical problems.   Assessment and Plan Assessment & Plan Right leg pain Leg pain initially started just after Christmas and was thought to be related to wearing tight compression socks.  Initially the pain was in the medial aspect of her upper thigh down to her foot Pain improved, but a few days ago recurred but is different Pain localized to inner distal right knee-Pes anserine area, exacerbated by activity, possibly due to tendinitis or muscle strain. No arthritis or neurological symptoms. - Apply heat and ice. - Use OTC topical medications like Biofreeze or Voltaren . - Continue Tylenol  for pain. - Monitor symptoms for 1-2 weeks. - Refer to sports medicine if no improvement for further evaluation, consider ultrasound.        [  1]  Current Outpatient Medications on File Prior to Visit  Medication Sig Dispense Refill   ALPRAZolam  (XANAX ) 0.25 MG tablet Take 1 tablet (0.25 mg total) by mouth daily as needed for anxiety. 30 tablet 0   Ascorbic Acid (VITAMIN C) 500 MG CHEW Chew 500 mg by mouth daily.     b complex vitamins capsule Take 1 capsule by mouth daily.     Calcium  Carbonate-Vitamin D  600-400 MG-UNIT tablet Take 1 tablet by mouth daily.     Cholecalciferol (VITAMIN D3) 50 MCG (2000 UT) TABS Take 2,000 Units by mouth daily.      clindamycin (CLEOCIN) 150 MG capsule Take 2 tabs by mouth 1 hour prior to dental appts     COD LIVER OIL PO Take 1 capsule by mouth daily.      conjugated estrogens (PREMARIN) vaginal cream Place 1 Applicatorful vaginally daily as needed (irritation).     docusate  sodium (COLACE) 100 MG capsule Take 1 capsule (100 mg total) by mouth 2 (two) times daily. 30 capsule 0   Ginkgo Biloba 60 MG CAPS Take 60 mg by mouth daily.      Misc Natural Products (TART CHERRY ADVANCED) CAPS Take 1 capsule by mouth daily.      Multiple Vitamins-Minerals (ONE-A-DAY WOMENS 50 PLUS PO) Take 1 tablet by mouth daily.     pyridOXINE (VITAMIN B-6) 100 MG tablet Take 100 mg by mouth daily.     Rhubarb (ESTROVEN COMPLETE PO) Take 1 tablet by mouth daily.      rosuvastatin  (CRESTOR ) 20 MG tablet TAKE 1 TABLET(20 MG) BY MOUTH DAILY 90 tablet 3   Turmeric (QC TUMERIC COMPLEX PO) Take 1 tablet by mouth daily.      vitamin B-12 (CYANOCOBALAMIN) 500 MCG tablet Take 500 mcg by mouth daily.     No current facility-administered medications on file prior to visit.   "

## 2024-11-08 NOTE — Patient Instructions (Addendum)
       Medications changes include :   None        Return if symptoms worsen or fail to improve.

## 2025-05-29 ENCOUNTER — Ambulatory Visit
